# Patient Record
Sex: Male | Born: 1970 | Race: White | Hispanic: No | Marital: Married | Smoking: Former smoker
Health system: Southern US, Community
[De-identification: ages and names within clinical notes are randomized; demographics above are authoritative.]

## PROBLEM LIST (undated history)

## (undated) DIAGNOSIS — N400 Enlarged prostate without lower urinary tract symptoms: Secondary | ICD-10-CM

## (undated) DIAGNOSIS — E291 Testicular hypofunction: Secondary | ICD-10-CM

## (undated) DIAGNOSIS — R7309 Other abnormal glucose: Secondary | ICD-10-CM

## (undated) DIAGNOSIS — N201 Calculus of ureter: Secondary | ICD-10-CM

## (undated) DIAGNOSIS — R7303 Prediabetes: Secondary | ICD-10-CM

## (undated) DIAGNOSIS — Z9989 Dependence on other enabling machines and devices: Secondary | ICD-10-CM

## (undated) DIAGNOSIS — G4730 Sleep apnea, unspecified: Secondary | ICD-10-CM

## (undated) DIAGNOSIS — E785 Hyperlipidemia, unspecified: Secondary | ICD-10-CM

## (undated) DIAGNOSIS — R030 Elevated blood-pressure reading, without diagnosis of hypertension: Secondary | ICD-10-CM

## (undated) DIAGNOSIS — S5782XA Crushing injury of left forearm, initial encounter: Secondary | ICD-10-CM

## (undated) DIAGNOSIS — G4733 Obstructive sleep apnea (adult) (pediatric): Secondary | ICD-10-CM

## (undated) DIAGNOSIS — F324 Major depressive disorder, single episode, in partial remission: Secondary | ICD-10-CM

## (undated) DIAGNOSIS — Q433 Congenital malformations of intestinal fixation: Secondary | ICD-10-CM

## (undated) DIAGNOSIS — E559 Vitamin D deficiency, unspecified: Secondary | ICD-10-CM

## (undated) DIAGNOSIS — I10 Essential (primary) hypertension: Secondary | ICD-10-CM

## (undated) DIAGNOSIS — Z973 Presence of spectacles and contact lenses: Secondary | ICD-10-CM

## (undated) DIAGNOSIS — S5702XA Crushing injury of left elbow, initial encounter: Secondary | ICD-10-CM

## (undated) DIAGNOSIS — R42 Dizziness and giddiness: Secondary | ICD-10-CM

## (undated) DIAGNOSIS — E669 Obesity, unspecified: Secondary | ICD-10-CM

## (undated) HISTORY — DX: Obesity, unspecified: E66.9

## (undated) HISTORY — DX: Testicular hypofunction: E29.1

## (undated) HISTORY — DX: Prediabetes: R73.03

## (undated) HISTORY — DX: Crushing injury of left forearm, initial encounter: S57.82XA

## (undated) HISTORY — DX: Crushing injury of left elbow, initial encounter: S57.02XA

## (undated) HISTORY — DX: Major depressive disorder, single episode, in partial remission: F32.4

## (undated) HISTORY — DX: Sleep apnea, unspecified: G47.30

## (undated) HISTORY — DX: Dizziness and giddiness: R42

## (undated) HISTORY — PX: COLONOSCOPY: SHX174

## (undated) HISTORY — DX: Obstructive sleep apnea (adult) (pediatric): G47.33

## (undated) HISTORY — DX: Morbid (severe) obesity due to excess calories: E66.01

## (undated) HISTORY — DX: Other abnormal glucose: R73.09

## (undated) HISTORY — DX: Essential (primary) hypertension: I10

## (undated) HISTORY — DX: Dependence on other enabling machines and devices: Z99.89

## (undated) HISTORY — DX: Hyperlipidemia, unspecified: E78.5

## (undated) HISTORY — PX: ESOPHAGOGASTRODUODENOSCOPY: SHX1529

## (undated) HISTORY — DX: Vitamin D deficiency, unspecified: E55.9

---

## 1996-12-29 HISTORY — PX: ABDOMINAL EXPLORATION SURGERY: SHX538

## 2002-09-06 ENCOUNTER — Emergency Department (HOSPITAL_COMMUNITY): Admission: EM | Admit: 2002-09-06 | Discharge: 2002-09-06 | Payer: Self-pay | Admitting: *Deleted

## 2005-03-08 ENCOUNTER — Emergency Department (HOSPITAL_COMMUNITY): Admission: EM | Admit: 2005-03-08 | Discharge: 2005-03-08 | Payer: Self-pay | Admitting: Emergency Medicine

## 2008-03-17 ENCOUNTER — Ambulatory Visit (HOSPITAL_COMMUNITY): Admission: RE | Admit: 2008-03-17 | Discharge: 2008-03-17 | Payer: Self-pay | Admitting: Internal Medicine

## 2010-06-03 ENCOUNTER — Ambulatory Visit (HOSPITAL_COMMUNITY): Admission: RE | Admit: 2010-06-03 | Discharge: 2010-06-03 | Payer: Self-pay | Admitting: Internal Medicine

## 2010-12-16 ENCOUNTER — Ambulatory Visit (HOSPITAL_COMMUNITY)
Admission: RE | Admit: 2010-12-16 | Discharge: 2010-12-16 | Payer: Self-pay | Source: Home / Self Care | Attending: Internal Medicine | Admitting: Internal Medicine

## 2011-02-15 ENCOUNTER — Emergency Department (HOSPITAL_COMMUNITY)
Admission: EM | Admit: 2011-02-15 | Discharge: 2011-02-15 | Disposition: A | Discharge status: Home / Self Care | Payer: PRIVATE HEALTH INSURANCE | Attending: Emergency Medicine | Admitting: Emergency Medicine

## 2011-02-15 DIAGNOSIS — F3289 Other specified depressive episodes: Secondary | ICD-10-CM | POA: Insufficient documentation

## 2011-02-15 DIAGNOSIS — F329 Major depressive disorder, single episode, unspecified: Secondary | ICD-10-CM | POA: Insufficient documentation

## 2011-02-15 DIAGNOSIS — J039 Acute tonsillitis, unspecified: Secondary | ICD-10-CM | POA: Insufficient documentation

## 2011-02-15 DIAGNOSIS — E7800 Pure hypercholesterolemia, unspecified: Secondary | ICD-10-CM | POA: Insufficient documentation

## 2011-02-15 DIAGNOSIS — Z79899 Other long term (current) drug therapy: Secondary | ICD-10-CM | POA: Insufficient documentation

## 2011-05-04 ENCOUNTER — Ambulatory Visit: Payer: 59 | Attending: Otolaryngology

## 2011-05-04 DIAGNOSIS — Z6835 Body mass index (BMI) 35.0-35.9, adult: Secondary | ICD-10-CM | POA: Insufficient documentation

## 2011-05-04 DIAGNOSIS — G4733 Obstructive sleep apnea (adult) (pediatric): Secondary | ICD-10-CM | POA: Insufficient documentation

## 2011-05-10 NOTE — Procedures (Signed)
NAME:  Shane Saunders, MINICHIELLO NO.:  [id number]  MEDICAL RECORD NO.:  [id number]          PATIENT TYPE:  OUT  LOCATION:  SLEEP LAB                     FACILITY:  [facility]  PHYSICIAN:  Cherokee Boccio A. Gerilyn Pilgrim, M.D. DATE OF BIRTH:  Oct 17, 1971  DATE OF STUDY:  05/04/2011                           NOCTURNAL POLYSOMNOGRAM  REFERRING PHYSICIAN:  Cristal Deer E. Ezzard Standing, M.D.  REFERRING PHYSICIAN:  Kristine Garbe. Ezzard Standing, MD.  RECORDING DATE:  May 04, 2011.  INDICATION:  This is a 40 year old who presents with hypersomnia, fatigue, and snoring.  The study is being done to evaluate for obstructive sleep apnea syndrome.  INDICATION FOR STUDY:  EPWORTH SLEEPINESS SCORE:  MEDICATIONS:  Prevacid, Zetia, Claritin.  EPWORTH SLEEPINESS SCALE:  11.  BMI:  35.  ARCHITECTURAL SUMMARY:  This is a split night recording with the initial portion being a diagnostic and second portion a titration recording. The total recording time is 404 minutes.  Sleep efficiency is 79%. Sleep latency is 31 minutes.  REM latency is 246 minutes.  RESPIRATORY SUMMARY:  The baseline oxygen saturation is 95, lowest saturation 83 during non-REM sleep.  Diagnostic AHI is 65.1.  The patient was subsequently titrated between pressures of 5 and 15. Optimal pressure is 14 with resolution of obstructive events.  LIMB MOVEMENT SUMMARY:  PLM index 0.  ELECTROCARDIOGRAM SUMMARY:  Average heart rate 69 with no significant dysrhythmias observed.  IMPRESSION:  Severe obstructive sleep apnea syndrome, which responds well to a CPAP of 14.  Thanks for this referral.  SLEEP ARCHITECTURE:  RESPIRATORY DATA:  OXYGEN DATA:  CARDIAC DATA:  MOVEMENT-PARASOMNIA:  IMPRESSIONS-RECOMMENDATIONS:     [name] A. Gerilyn Pilgrim, M.D. Electronically Signed 05/12/2011 11:43:54    KAD/MEDQ  D:  05/09/2011 19:02:22  T:  05/10/2011 06:02:26  Job:  [id number]

## 2011-07-25 ENCOUNTER — Inpatient Hospital Stay (INDEPENDENT_AMBULATORY_CARE_PROVIDER_SITE_OTHER)
Admission: RE | Admit: 2011-07-25 | Discharge: 2011-07-25 | Disposition: A | Discharge status: Home / Self Care | Payer: 59 | Source: Ambulatory Visit | Attending: Family Medicine | Admitting: Family Medicine

## 2011-07-25 DIAGNOSIS — S0550XA Penetrating wound with foreign body of unspecified eyeball, initial encounter: Secondary | ICD-10-CM

## 2011-10-20 ENCOUNTER — Other Ambulatory Visit (HOSPITAL_COMMUNITY): Payer: Self-pay | Admitting: Internal Medicine

## 2011-10-20 ENCOUNTER — Ambulatory Visit (HOSPITAL_COMMUNITY)
Admission: RE | Admit: 2011-10-20 | Discharge: 2011-10-20 | Disposition: A | Discharge status: Home / Self Care | Payer: 59 | Source: Ambulatory Visit | Attending: Internal Medicine | Admitting: Internal Medicine

## 2011-10-20 DIAGNOSIS — R059 Cough, unspecified: Secondary | ICD-10-CM

## 2011-10-20 DIAGNOSIS — J984 Other disorders of lung: Secondary | ICD-10-CM | POA: Insufficient documentation

## 2011-10-20 DIAGNOSIS — R062 Wheezing: Secondary | ICD-10-CM | POA: Insufficient documentation

## 2011-10-20 DIAGNOSIS — R05 Cough: Secondary | ICD-10-CM | POA: Insufficient documentation

## 2011-10-20 DIAGNOSIS — Z87891 Personal history of nicotine dependence: Secondary | ICD-10-CM | POA: Insufficient documentation

## 2013-10-26 ENCOUNTER — Encounter: Payer: Self-pay | Admitting: Internal Medicine

## 2013-10-27 ENCOUNTER — Encounter: Payer: Self-pay | Admitting: Internal Medicine

## 2013-10-27 DIAGNOSIS — E559 Vitamin D deficiency, unspecified: Secondary | ICD-10-CM | POA: Insufficient documentation

## 2013-10-27 DIAGNOSIS — E782 Mixed hyperlipidemia: Secondary | ICD-10-CM | POA: Insufficient documentation

## 2013-10-27 DIAGNOSIS — G4733 Obstructive sleep apnea (adult) (pediatric): Secondary | ICD-10-CM | POA: Insufficient documentation

## 2013-10-27 DIAGNOSIS — R7309 Other abnormal glucose: Secondary | ICD-10-CM | POA: Insufficient documentation

## 2013-10-27 DIAGNOSIS — I10 Essential (primary) hypertension: Secondary | ICD-10-CM | POA: Insufficient documentation

## 2013-11-07 ENCOUNTER — Encounter: Payer: Self-pay | Admitting: Physician Assistant

## 2013-11-07 ENCOUNTER — Ambulatory Visit: Payer: 59 | Admitting: Physician Assistant

## 2013-11-07 VITALS — BP 120/80 | HR 80 | Temp 99.0°F | Resp 16 | Wt 245.0 lb

## 2013-11-07 DIAGNOSIS — E785 Hyperlipidemia, unspecified: Secondary | ICD-10-CM

## 2013-11-07 DIAGNOSIS — R197 Diarrhea, unspecified: Secondary | ICD-10-CM

## 2013-11-07 DIAGNOSIS — I10 Essential (primary) hypertension: Secondary | ICD-10-CM

## 2013-11-07 DIAGNOSIS — E669 Obesity, unspecified: Secondary | ICD-10-CM

## 2013-11-07 DIAGNOSIS — E559 Vitamin D deficiency, unspecified: Secondary | ICD-10-CM

## 2013-11-07 DIAGNOSIS — R7303 Prediabetes: Secondary | ICD-10-CM

## 2013-11-07 MED ORDER — METRONIDAZOLE 500 MG PO TABS: 500.0000 mg | ORAL_TABLET | Freq: Three times a day (TID) | ORAL | Status: AC

## 2013-11-07 MED ORDER — CIPROFLOXACIN HCL 500 MG PO TABS: 500.0000 mg | ORAL_TABLET | Freq: Two times a day (BID) | ORAL | Status: AC

## 2013-11-07 MED ORDER — HYOSCYAMINE SULFATE 0.125 MG SL SUBL: 0.1250 mg | SUBLINGUAL_TABLET | SUBLINGUAL | Status: DC | PRN

## 2013-11-07 NOTE — Patient Instructions (Signed)
Diverticulitis °A diverticulum is a small pouch or sac on the colon. Diverticulosis is the presence of these diverticula on the colon. Diverticulitis is the irritation (inflammation) or infection of diverticula. °CAUSES  °The colon and its diverticula contain bacteria. If food particles block the tiny opening to a diverticulum, the bacteria inside can grow and cause an increase in pressure. This leads to infection and inflammation and is called diverticulitis. °SYMPTOMS  °· Abdominal pain and tenderness. Usually, the pain is located on the left side of your abdomen. However, it could be located elsewhere. °· Fever. °· Bloating. °· Feeling sick to your stomach (nausea). °· Throwing up (vomiting). °· Abnormal stools. °DIAGNOSIS  °Your caregiver will take a history and perform a physical exam. Since many things can cause abdominal pain, other tests may be necessary. Tests may include: °· Blood tests. °· Urine tests. °· X-ray of the abdomen. °· CT scan of the abdomen. °Sometimes, surgery is needed to determine if diverticulitis or other conditions are causing your symptoms. °TREATMENT  °Most of the time, you can be treated without surgery. Treatment includes: °· Resting the bowels by only having liquids for a few days. As you improve, you will need to eat a low-fiber diet. °· Intravenous (IV) fluids if you are losing body fluids (dehydrated). °· Antibiotic medicines that treat infections may be given. °· Pain and nausea medicine, if needed. °· Surgery if the inflamed diverticulum has burst. °HOME CARE INSTRUCTIONS  °· Try a clear liquid diet (broth, tea, or water for as long as directed by your caregiver). You may then gradually begin a low-fiber diet as tolerated.  °A low-fiber diet is a diet with less than 10 grams of fiber. Choose the foods below to reduce fiber in the diet: °· White breads, cereals, rice, and pasta. °· Cooked fruits and vegetables or soft fresh fruits and vegetables without the skin. °· Ground or  well-cooked tender beef, ham, veal, lamb, pork, or poultry. °· Eggs and seafood. °· After your diverticulitis symptoms have improved, your caregiver may put you on a high-fiber diet. A high-fiber diet includes 14 grams of fiber for every 1000 calories consumed. For a standard 2000 calorie diet, you would need 28 grams of fiber. Follow these diet guidelines to help you increase the fiber in your diet. It is important to slowly increase the amount fiber in your diet to avoid gas, constipation, and bloating. °· Choose whole-grain breads, cereals, pasta, and brown rice. °· Choose fresh fruits and vegetables with the skin on. Do not overcook vegetables because the more vegetables are cooked, the more fiber is lost. °· Choose more nuts, seeds, legumes, dried peas, beans, and lentils. °· Look for food products that have greater than 3 grams of fiber per serving on the Nutrition Facts label. °· Take all medicine as directed by your caregiver. °· If your caregiver has given you a follow-up appointment, it is very important that you go. Not going could result in lasting (chronic) or permanent injury, pain, and disability. If there is any problem keeping the appointment, call to reschedule. °SEEK MEDICAL CARE IF:  °· Your pain does not improve. °· You have a hard time advancing your diet beyond clear liquids. °· Your bowel movements do not return to normal. °SEEK IMMEDIATE MEDICAL CARE IF:  °· Your pain becomes worse. °· You have an oral temperature above 102° F (38.9° C), not controlled by medicine. °· You have repeated vomiting. °· You have bloody or black, tarry stools. °·   Symptoms that brought you to your caregiver become worse or are not getting better. °MAKE SURE YOU:  °· Understand these instructions. °· Will watch your condition. °· Will get help right away if you are not doing well or get worse. °Document Released: 09/24/2005 Document Revised: 03/08/2012 Document Reviewed: 01/20/2011 °[employer]® Patient Information  ©2014 [employer]. ° °

## 2013-11-07 NOTE — Progress Notes (Signed)
HPI Patient presents for 3 month follow up with hypertension, hyperlipidemia, prediabetes and vitamin D.  Patient's blood pressure has been controlled at home. Patient denies chest pain, shortness of breath, dizziness.  Patient's cholesterol is diet controlled and is on atrovastatin  and denies myalgias. The cholesterol last visit was  . he has  been working on diet and exercise for prediabetes, denies changes in vision, polys, and paresthesias.   Patient is on Vitamin D supplement.  Current Medications:    Medication List       This list is accurate as of: 11/07/13  3:33 PM.  Always use your most recent med list.               ALPRAZolam 2 MG 24 hr tablet  Commonly known as:  XANAX XR  2 mg. 1 pill daily PRN     cholecalciferol 1000 UNITS tablet  Commonly known as:  VITAMIN D  Take 8,000 Units by mouth daily.     citalopram 40 MG tablet  Commonly known as:  CELEXA  40 mg. 1.5 pills daily     LIPITOR 80 MG tablet  Generic drug:  atorvastatin  Take 80 mg by mouth daily. Takes 1/2 daily     pravastatin 40 MG tablet  Commonly known as:  PRAVACHOL  Take 40 mg by mouth daily.       Allergies:  Allergies  Allergen Reactions  . Effexor [Venlafaxine]   . Pravastatin   . Prednisone     Decrease sleep  . Zinc     ROS Constitutional: Denies fever, chills, weight loss/gain, headaches, insomnia, fatigue, night sweats, and change in appetite. Eyes: Denies redness, blurred vision, diplopia, discharge, itchy, watery eyes.  ENT: Denies discharge, congestion, post nasal drip, sore throat, earache, dental pain, Tinnitus, Vertigo, Sinus pain, snoring.  Cardio: Denies chest pain, palpitations, irregular heartbeat,  dyspnea, diaphoresis, orthopnea, PND, claudication, edema Respiratory: denies cough, dyspnea,pleurisy, hoarseness, wheezing.  Gastrointestinal: +diarrhea and nausea with vomiting once. Daughter also has gastroenteritis.  Denies dysphagia, heartburn,  water brash, pain,  cramps, bloating, constipation, hematemesis, melena, hematochezia,  hemorrhoids Genitourinary: Denies dysuria, frequency, urgency, nocturia, hesitancy, discharge, hematuria, flank pain Musculoskeletal: Denies arthralgia, myalgia, stiffness, Jt. Swelling, pain, limp, and strain/sprain. Skin: Denies puritis, rash, hives, warts, acne, eczema, changing in skin lesion Neuro: Weakness, tremor, incoordination, spasms, paresthesia, pain Psychiatric: Denies confusion, memory loss, sensory loss Endocrine: Denies change in weight, skin, hair change, nocturia, and paresthesia, Diabetic Polys, visual blurring, hyper /hypo glycemic episodes.  Heme/Lymph: Excessive bleeding, bruising, enlarged lymph nodes Medical History:  Past Medical History  Diagnosis Date  . Hypertension   . Hyperlipidemia   . OSA on CPAP   . Prediabetes   . Obesity (BMI 30-39.9)   . Hypogonadism male   . Vitamin D deficiency   . Anemia    Family history- Review and unchanged Social history- Review and unchanged Physical Exam: Filed Vitals:   11/07/13 1528  BP: 120/80  Pulse: 80  Temp: 99 F (37.2 C)  Resp: 16   Filed Weights   11/07/13 1528  Weight: 245 lb (111.131 kg)   General Appearance: Well nourished, in no apparent distress. Eyes: PERRLA, EOMs, conjunctiva no swelling or erythema, normal fundi and vessels. Sinuses: No Frontal/maxillary tenderness ENT/Mouth: Ext aud canals clear, with TMs without erythema, bulging.No erythema, swelling, or exudate on post pharynx.  Tonsils not swollen or erythematous. Hearing normal.  Neck: Supple, thyroid normal.  Respiratory: Respiratory effort normal, BS equal bilaterally without rales,  rhonci, wheezing or stridor.  Cardio: Heart sounds normal, regular rate and rhythm without murmurs, rubs or gallops. Peripheral pulses brisk and equal bilaterally, without edema.  Abdomen: Flat, soft, with bowel sounds. Nontender, no guarding, rebound, hernias, masses, or organomegaly.   Lymphatics: Non tender without lymphadenopathy.  Musculoskeletal: Full ROM all peripheral extremities, joint stability, 5/5 strength, and normal gait. Skin: Warm, dry without rashes, lesions, ecchymosis.  Neuro: Cranial nerves intact, reflexes equal bilaterally. Normal muscle tone, no cerebellar symptoms. Sensation intact.  Pysch: Awake and oriented X 3, normal affect, Insight and Judgment appropriate.   Assessement and Plan:  Hypertension: Continue medication, monitor blood pressure at home. Continue DASH diet. Cholesterol: Continue diet and exercise. Check cholesterol.  Pre-diabetes-Continue diet and exercise. Check A1C Vitamin D Def- get back on medications.  Obesity- discussed diet and exercise.  Gastroenteritis vs Diverticulitis- + LLQ tenderness and temperative, levsin called in with Cipro/Flagyl and hand out given. If worsening pain go to ER.   Quentin Mulling 3:33 PM

## 2013-11-08 LAB — CBC WITH DIFFERENTIAL/PLATELET
Basophils Absolute: 0 10*3/uL (ref 0.0–0.1)
Basophils Relative: 0 % (ref 0–1)
Eosinophils Absolute: 0.4 10*3/uL (ref 0.0–0.7)
Eosinophils Relative: 8 % — ABNORMAL HIGH (ref 0–5)
HCT: 45.1 % (ref 39.0–52.0)
Hemoglobin: 16.2 g/dL (ref 13.0–17.0)
Lymphocytes Relative: 26 % (ref 12–46)
Lymphs Abs: 1.3 10*3/uL (ref 0.7–4.0)
MCH: 31.6 pg (ref 26.0–34.0)
MCHC: 35.9 g/dL (ref 30.0–36.0)
MCV: 87.9 fL (ref 78.0–100.0)
Monocytes Absolute: 0.8 10*3/uL (ref 0.1–1.0)
Monocytes Relative: 16 % — ABNORMAL HIGH (ref 3–12)
Neutro Abs: 2.5 10*3/uL (ref 1.7–7.7)
Neutrophils Relative %: 50 % (ref 43–77)
RBC: 5.13 MIL/uL (ref 4.22–5.81)
RDW: 13.2 % (ref 11.5–15.5)
WBC: 5.4 10*3/uL (ref 4.0–10.5)

## 2013-11-08 LAB — BASIC METABOLIC PANEL WITH GFR
BUN: 10 mg/dL (ref 6–23)
CO2: 27 mEq/L (ref 19–32)
Calcium: 9.4 mg/dL (ref 8.4–10.5)
Chloride: 101 mEq/L (ref 96–112)
Creat: 1.09 mg/dL (ref 0.50–1.35)
GFR, Est African American: >89 mL/min
GFR, Est Non African American: 83 mL/min
Glucose, Bld: 96 mg/dL (ref 70–99)
Potassium: 3.7 mEq/L (ref 3.5–5.3)
Sodium: 137 mEq/L (ref 135–145)

## 2013-11-08 LAB — LIPID PANEL
Cholesterol: 140 mg/dL (ref 0–200)
HDL: 34 mg/dL — ABNORMAL LOW (ref >39)
LDL Cholesterol: 81 mg/dL (ref 0–99)
Total CHOL/HDL Ratio: 4.1 Ratio
Triglycerides: 126 mg/dL (ref <150)
VLDL: 25 mg/dL (ref 0–40)

## 2013-11-08 LAB — HEMOGLOBIN A1C
Hgb A1c MFr Bld: 5.5 % (ref <5.7)
Mean Plasma Glucose: 111 mg/dL (ref <117)

## 2013-11-08 LAB — HEPATIC FUNCTION PANEL
ALT: 38 U/L (ref 0–53)
AST: 26 U/L (ref 0–37)
Albumin: 4.3 g/dL (ref 3.5–5.2)
Alkaline Phosphatase: 64 U/L (ref 39–117)
Bilirubin, Direct: 0.2 mg/dL (ref 0.0–0.3)
Indirect Bilirubin: 0.7 mg/dL (ref 0.0–0.9)
Total Bilirubin: 0.9 mg/dL (ref 0.3–1.2)
Total Protein: 6.5 g/dL (ref 6.0–8.3)

## 2013-11-08 LAB — TSH: TSH: 3.192 u[IU]/mL (ref 0.350–4.500)

## 2013-11-08 LAB — INSULIN, FASTING: Insulin fasting, serum: 50 u[IU]/mL — ABNORMAL HIGH (ref 3–28)

## 2013-11-30 ENCOUNTER — Ambulatory Visit (INDEPENDENT_AMBULATORY_CARE_PROVIDER_SITE_OTHER): Payer: 59 | Admitting: Physician Assistant

## 2013-11-30 ENCOUNTER — Encounter: Payer: Self-pay | Admitting: Physician Assistant

## 2013-11-30 VITALS — BP 110/78 | HR 76 | Temp 98.6°F | Resp 16 | Ht 70.5 in | Wt 253.0 lb

## 2013-11-30 DIAGNOSIS — E559 Vitamin D deficiency, unspecified: Secondary | ICD-10-CM

## 2013-11-30 DIAGNOSIS — R7303 Prediabetes: Secondary | ICD-10-CM

## 2013-11-30 DIAGNOSIS — E785 Hyperlipidemia, unspecified: Secondary | ICD-10-CM

## 2013-11-30 DIAGNOSIS — I10 Essential (primary) hypertension: Secondary | ICD-10-CM

## 2013-11-30 DIAGNOSIS — J209 Acute bronchitis, unspecified: Secondary | ICD-10-CM

## 2013-11-30 LAB — BASIC METABOLIC PANEL WITH GFR
BUN: 11 mg/dL (ref 6–23)
CO2: 30 mEq/L (ref 19–32)
Calcium: 9.5 mg/dL (ref 8.4–10.5)
Chloride: 99 mEq/L (ref 96–112)
Creat: 1.16 mg/dL (ref 0.50–1.35)
GFR, Est African American: 89 mL/min
GFR, Est Non African American: 77 mL/min
Glucose, Bld: 124 mg/dL — ABNORMAL HIGH (ref 70–99)
Potassium: 4.1 mEq/L (ref 3.5–5.3)
Sodium: 141 mEq/L (ref 135–145)

## 2013-11-30 LAB — HEPATIC FUNCTION PANEL
ALT: 33 U/L (ref 0–53)
AST: 20 U/L (ref 0–37)
Albumin: 4.6 g/dL (ref 3.5–5.2)
Alkaline Phosphatase: 70 U/L (ref 39–117)
Bilirubin, Direct: 0.1 mg/dL (ref 0.0–0.3)
Indirect Bilirubin: 0.6 mg/dL (ref 0.0–0.9)
Total Bilirubin: 0.7 mg/dL (ref 0.3–1.2)
Total Protein: 7 g/dL (ref 6.0–8.3)

## 2013-11-30 LAB — LIPID PANEL
Cholesterol: 151 mg/dL (ref 0–200)
HDL: 35 mg/dL — ABNORMAL LOW (ref >39)
LDL Cholesterol: 53 mg/dL (ref 0–99)
Total CHOL/HDL Ratio: 4.3 Ratio
Triglycerides: 314 mg/dL — ABNORMAL HIGH (ref <150)
VLDL: 63 mg/dL — ABNORMAL HIGH (ref 0–40)

## 2013-11-30 MED ORDER — DEXAMETHASONE SODIUM PHOSPHATE 100 MG/10ML IJ SOLN
10.0000 mg | Freq: Once | INTRAMUSCULAR | Status: AC
  Administered 2013-11-30: 10 mg via INTRAMUSCULAR

## 2013-11-30 MED ORDER — LEVOFLOXACIN 500 MG PO TABS: 500.0000 mg | ORAL_TABLET | Freq: Every day | ORAL | Status: AC

## 2013-11-30 NOTE — Progress Notes (Signed)
HPI Patient presents for 3 month follow up with hypertension, hyperlipidemia, prediabetes and vitamin D. Patient's blood pressure has been controlled at home. Patient denies chest pain, shortness of breath, dizziness.  Patient's cholesterol is diet controlled. In addition last visit he was switched from pravastatin to atrovastatin and denies myalgias. The cholesterol last visit was LDL 117. The patient has been working on diet and exercise for prediabetes, and denies changes in vision, polys, and paresthesias.  A1C 5.4 but insulin is still elevated at 41.  Patient is on Vitamin D supplement.  For the past 3-5 days, having post nasal drip, sinus congestion, productive clear cough, denies fever chills SOB, teeth pain. He has been on theraflu, zyrtec without relief.  Current Medications:  Current Outpatient Prescriptions on File Prior to Visit  Medication Sig Dispense Refill  . ALPRAZolam (XANAX XR) 2 MG 24 hr tablet 2 mg. 1 pill daily PRN      . atorvastatin (LIPITOR) 80 MG tablet Take 80 mg by mouth daily. Takes 1/2 daily      . cholecalciferol (VITAMIN D) 1000 UNITS tablet Take 8,000 Units by mouth daily.      . citalopram (CELEXA) 40 MG tablet 40 mg. 1.5 pills daily      . hyoscyamine (LEVSIN/SL) 0.125 MG SL tablet Place 1 tablet (0.125 mg total) under the tongue every 4 (four) hours as needed.  30 tablet  0   No current facility-administered medications on file prior to visit.   Medical History:  Past Medical History  Diagnosis Date  . Hypertension   . Hyperlipidemia   . OSA on CPAP   . Prediabetes   . Obesity (BMI 30-39.9)   . Hypogonadism male   . Vitamin D deficiency   . Anemia    Allergies:  Allergies  Allergen Reactions  . Effexor [Venlafaxine]   . Pravastatin   . Prednisone     Decrease sleep  . Zinc     ROS Constitutional: Denies fever, chills, weight loss/gain, headaches, insomnia, fatigue, night sweats, and change in appetite. Eyes: Denies redness, blurred vision,  diplopia, discharge, itchy, watery eyes.  ENT: +congestion, post nasal drip, sore throat, earache, Denies discharge,  dental pain, Tinnitus, Vertigo, snoring.  Cardio: Denies chest pain, palpitations, irregular heartbeat,  dyspnea, diaphoresis, orthopnea, PND, claudication, edema Respiratory: + cough , wheezing.  denies  dyspnea,pleurisy, hoarseness Gastrointestinal: Denies dysphagia, heartburn,  water brash, pain, cramps, nausea, vomiting, bloating, diarrhea, constipation, hematemesis, melena, hematochezia,  hemorrhoids Genitourinary: Denies dysuria, frequency, urgency, nocturia, hesitancy, discharge, hematuria, flank pain Musculoskeletal: Denies arthralgia, myalgia, stiffness, Jt. Swelling, pain, limp, and strain/sprain. Skin: Denies pruritis, rash, hives, warts, acne, eczema, changing in skin lesion Neuro: Denies Weakness, tremor, incoordination, spasms, paresthesia, pain Psychiatric: Denies confusion, memory loss, sensory loss Endocrine: Denies change in weight, skin, hair change, nocturia, and paresthesia, Diabetic Polys, Denies visual blurring, hyper /hypo glycemic episodes.  Heme/Lymph: Denies Excessive bleeding, bruising, enlarged lymph nodes  Family history- Review and unchanged Social history- Review and unchanged Physical Exam: Filed Vitals:   11/30/13 1410  BP: 110/78  Pulse: 76  Temp: 98.6 F (37 C)  Resp: 16   Filed Weights   11/30/13 1410  Weight: 253 lb (114.76 kg)   General Appearance: Well nourished, in no apparent distress. Eyes: PERRLA, EOMs, conjunctiva no swelling or erythema, normal fundi and vessels. Sinuses: + Frontal/maxillary tenderness ENT/Mouth: Ext aud canals clear, with TMs without erythema, bulging.+ erythema, swelling, but without exudate on post pharynx.  Tonsils not swollen or erythematous.  Hearing normal.  Neck: Supple, thyroid normal.  Respiratory: Respiratory effort normal, BS equal bilaterally + wheezing without rales, rhonchi, or stridor.   Cardio: Heart sounds normal, regular rate and rhythm without murmurs, rubs or gallops. Peripheral pulses brisk and equal bilaterally, without edema.  Abdomen: obese, soft, with bowel sounds. Non tender, no guarding, rebound, hernias, masses, or organomegaly.  Lymphatics: Non tender without lymphadenopathy.  Musculoskeletal: Full ROM all peripheral extremities, joint stability, 5/5 strength, and normal gait. Skin: Warm, dry without rashes, lesions, ecchymosis.  Neuro: Cranial nerves intact, reflexes equal bilaterally. Normal muscle tone, no cerebellar symptoms. Sensation intact.  Psych: Awake and oriented X 3, normal affect, Insight and Judgment appropriate.   Assessment and Plan:  Hypertension: Continue medication, monitor blood pressure at home. Continue DASH diet. Cholesterol: Continue diet and exercise. Check cholesterol.  Pre-diabetes-Continue diet and exercise. Check A1C Vitamin D Def- check level and continue medications.  Bronchitis/sinusitis- Levaquin #10 and Dexamethasone shot- call if you are not feeling better   Shane Saunders 2:19 PM

## 2013-12-01 ENCOUNTER — Ambulatory Visit: Payer: Self-pay | Admitting: Physician Assistant

## 2013-12-01 LAB — INSULIN, FASTING: Insulin fasting, serum: 181 u[IU]/mL — ABNORMAL HIGH (ref 3–28)

## 2013-12-01 LAB — TSH: TSH: 1.595 u[IU]/mL (ref 0.350–4.500)

## 2013-12-01 LAB — CBC WITH DIFFERENTIAL/PLATELET
Basophils Absolute: 0.1 10*3/uL (ref 0.0–0.1)
Basophils Relative: 1 % (ref 0–1)
Eosinophils Absolute: 0.5 10*3/uL (ref 0.0–0.7)
Eosinophils Relative: 8 % — ABNORMAL HIGH (ref 0–5)
HCT: 43.8 % (ref 39.0–52.0)
Hemoglobin: 15.6 g/dL (ref 13.0–17.0)
Lymphocytes Relative: 26 % (ref 12–46)
Lymphs Abs: 1.8 10*3/uL (ref 0.7–4.0)
MCH: 32.1 pg (ref 26.0–34.0)
MCHC: 35.6 g/dL (ref 30.0–36.0)
MCV: 90.1 fL (ref 78.0–100.0)
Monocytes Absolute: 0.8 10*3/uL (ref 0.1–1.0)
Monocytes Relative: 11 % (ref 3–12)
Neutro Abs: 3.9 10*3/uL (ref 1.7–7.7)
Neutrophils Relative %: 54 % (ref 43–77)
RBC: 4.86 MIL/uL (ref 4.22–5.81)
RDW: 13.9 % (ref 11.5–15.5)
WBC: 7 10*3/uL (ref 4.0–10.5)

## 2013-12-01 LAB — HEMOGLOBIN A1C
Hgb A1c MFr Bld: 5.6 % (ref <5.7)
Mean Plasma Glucose: 114 mg/dL (ref <117)

## 2013-12-01 LAB — VITAMIN D 25 HYDROXY (VIT D DEFICIENCY, FRACTURES): Vit D, 25-Hydroxy: 42 ng/mL (ref 30–89)

## 2014-02-02 ENCOUNTER — Other Ambulatory Visit: Payer: Self-pay | Admitting: Internal Medicine

## 2014-02-08 ENCOUNTER — Ambulatory Visit (INDEPENDENT_AMBULATORY_CARE_PROVIDER_SITE_OTHER): Payer: 59 | Admitting: Internal Medicine

## 2014-02-08 ENCOUNTER — Encounter: Payer: Self-pay | Admitting: Internal Medicine

## 2014-02-08 VITALS — BP 114/66 | HR 80 | Temp 98.2°F | Resp 16 | Wt 256.0 lb

## 2014-02-08 DIAGNOSIS — R7309 Other abnormal glucose: Secondary | ICD-10-CM

## 2014-02-08 DIAGNOSIS — E559 Vitamin D deficiency, unspecified: Secondary | ICD-10-CM

## 2014-02-08 DIAGNOSIS — R7303 Prediabetes: Secondary | ICD-10-CM

## 2014-02-08 DIAGNOSIS — Z79899 Other long term (current) drug therapy: Secondary | ICD-10-CM | POA: Insufficient documentation

## 2014-02-08 DIAGNOSIS — E785 Hyperlipidemia, unspecified: Secondary | ICD-10-CM

## 2014-02-08 DIAGNOSIS — E291 Testicular hypofunction: Secondary | ICD-10-CM

## 2014-02-08 DIAGNOSIS — I10 Essential (primary) hypertension: Secondary | ICD-10-CM

## 2014-02-08 DIAGNOSIS — E782 Mixed hyperlipidemia: Secondary | ICD-10-CM

## 2014-02-08 MED ORDER — MELOXICAM 15 MG PO TABS: ORAL_TABLET | ORAL | Status: DC

## 2014-02-08 NOTE — Progress Notes (Signed)
Patient ID: Shane Saunders, male   DOB: [birth year], 43 y.o.   MRN: [id number]   This very nice 43 y.o. MWM presents for 3 month follow up with Hypertension, Hyperlipidemia, Pre-Diabetes and Vitamin D Deficiency. He also has OSA and reports infrequently using his CPAP device due to poor fit.   HTN predates since 2010. BP has been controlled at home. Today's BP: 114/66 mmHg . Patient denies any cardiac type chest pain, palpitations, dyspnea/orthopnea/PND, dizziness, claudication, or dependent edema.   Hyperlipidemia is controlled with diet & meds.  Patient denies myalgias or other med SE's.  Lab Results  Component Value Date   CHOL 151 11/30/2013   HDL 35* 11/30/2013   LDLCALC 53 11/30/2013   TRIG 314* 11/30/2013   CHOLHDL 4.3 11/30/2013    Also, the patient has history of PreDiabetes/insulin resistance since 03/2011  with last A1c of  5.4% with elevated insulin 41 in Aug 2014. Patient denies any symptoms of reactive hypoglycemia, diabetic polys, paresthesias or visual blurring.    Further, Patient has history of Vitamin D Deficiency of 72 in 2008 with last vitamin D of 53 in Oct 2014. Patient supplements vitamin D without any suspected side-effects.    Medication List       This list is accurate as of: 02/08/14  7:16 PM.  Always use your most recent med list.               ALPRAZolam 2 MG 24 hr tablet  Commonly known as:  XANAX XR  2 mg. 1 pill daily PRN     citalopram 40 MG tablet  Commonly known as:  CELEXA  TAKE 1&1/2 TABLET BY MOUTH EVERY DAY FOR MOOD     LIPITOR 80 MG tablet  Generic drug:  atorvastatin  Take 80 mg by mouth daily. Takes 1/2 daily     meloxicam 15 MG tablet  Commonly known as:  MOBIC  1/2 to 1 tablet daily with food for Pain and inflammation     VITAMIN D PO  Take 2,000 Units by mouth. Takes 8000 units daily         Allergies  Allergen Reactions  . Effexor [Venlafaxine]   . Pravastatin   . Prednisone     Decrease sleep  . Zinc     PMHx:    Past Medical History  Diagnosis Date  . Hypertension   . Hyperlipidemia   . OSA on CPAP   . Prediabetes   . Obesity (BMI 30-39.9)   . Hypogonadism male   . Vitamin D deficiency   . Anemia     FHx:    Reviewed / unchanged  SHx:    Reviewed / unchanged  Systems Review: Constitutional: Denies fever, chills, wt changes, headaches, insomnia, fatigue, night sweats, change in appetite. Eyes: Denies redness, blurred vision, diplopia, discharge, itchy, watery eyes.  ENT: Denies discharge, congestion, post nasal drip, epistaxis, sore throat, earache, hearing loss, dental pain, tinnitus, vertigo, sinus pain, snoring.  CV: Denies chest pain, palpitations, irregular heartbeat, syncope, dyspnea, diaphoresis, orthopnea, PND, claudication, edema. Respiratory: denies cough, dyspnea, DOE, pleurisy, hoarseness, laryngitis, wheezing.  Gastrointestinal: Denies dysphagia, odynophagia, heartburn, reflux, water brash, abdominal pain or cramps, nausea, vomiting, bloating, diarrhea, constipation, hematemesis, melena, hematochezia,  or hemorrhoids. Genitourinary: Denies dysuria, frequency, urgency, nocturia, hesitancy, discharge, hematuria, flank pain. Musculoskeletal: Denies arthralgias, myalgias, stiffness, jt. swelling, pain, limp, strain/sprain.  Skin: Denies pruritus, rash, hives, warts, acne, eczema, change in skin lesion(s). Neuro: No weakness, tremor, incoordination, spasms, paresthesia, or  pain. Psychiatric: Denies confusion, memory loss, or sensory loss. Endo: Denies change in weight, skin, hair change.  Heme/Lymph: No excessive bleeding, bruising, orenlarged lymph nodes.  Filed Vitals:   02/08/14 1604  BP: 114/66  Pulse: 80  Temp: 98.2 F (36.8 C)  Resp: 16    Estimated body mass index is 36.2 kg/(m^2) as calculated from the following:   Height as of 11/30/13: 5' 10.5" (1.791 m).   Weight as of this encounter: 256 lb (116.121 kg).  On Exam: Appears well nourished - in no  distress. Eyes: PERRLA, EOMs, conjunctiva no swelling or erythema. Sinuses: No frontal/maxillary tenderness ENT/Mouth: EAC's clear, TM's nl w/o erythema, bulging. Nares clear w/o erythema, swelling, exudates. Oropharynx clear without erythema or exudates. Oral hygiene is good. Tongue normal, non obstructing. Hearing intact.  Neck: Supple. Thyroid nl. Car 2+/2+ without bruits, nodes or JVD. Chest: Respirations nl with BS clear & equal w/o rales, rhonchi, wheezing or stridor.  Cor: Heart sounds normal w/ regular rate and rhythm without sig. murmurs, gallops, clicks, or rubs. Peripheral pulses normal and equal  without edema.  Abdomen: Soft & bowel sounds normal. Non-tender w/o guarding, rebound, hernias, masses, or organomegaly.  Lymphatics: Unremarkable.  Musculoskeletal: Full ROM all peripheral extremities, joint stability, 5/5 strength, and normal gait. C/o Bilat knee pains Skin: Warm, dry without exposed rashes, lesions, ecchymosis apparent.  Neuro: Cranial nerves intact, reflexes equal bilaterally. Sensory-motor testing grossly intact. Tendon reflexes grossly intact.  Pysch: Alert & oriented x 3. Insight and judgement nl & appropriate. No ideations.  Assessment and Plan:  1. Hypertension - Continue monitor blood pressure at home. Continue diet/meds same.  2. Hyperlipidemia - Continue diet/meds, exercise,& lifestyle modifications. Continue monitor periodic cholesterol/liver & renal functions   3. Pre-diabetes/Insulin Resistance - Continue diet, exercise, lifestyle modifications. Monitor appropriate labs.  4. Vitamin D Deficiency - Continue supplementation.  5. Morbid Obesity (BMI 36)  6. OSA/ off CPAP  7. DJD, knees - try Rx Meloxicam 15 mg  Recommended regular exercise, BP monitoring, weight control, and discussed med and SE's. Recommended labs to assess and monitor clinical status. Further disposition pending results of labs.

## 2014-02-08 NOTE — Patient Instructions (Addendum)
Dr Irene Limbo  - Dental       [phone]   for [facility]   Hypertension As your heart beats, it forces blood through your arteries. This force is your blood pressure. If the pressure is too high, it is called hypertension (HTN) or high blood pressure. HTN is dangerous because you may have it and not know it. High blood pressure may mean that your heart has to work harder to pump blood. Your arteries may be narrow or stiff. The extra work puts you at risk for heart disease, stroke, and other problems.  Blood pressure consists of two numbers, a higher number over a lower, 110/72, for example. It is stated as "110 over 72." The ideal is below 120 for the top number (systolic) and under 80 for the bottom (diastolic). Write down your blood pressure today. You should pay close attention to your blood pressure if you have certain conditions such as:  Heart failure.  Prior heart attack.  Diabetes  Chronic kidney disease.  Prior stroke.  Multiple risk factors for heart disease. To see if you have HTN, your blood pressure should be measured while you are seated with your arm held at the level of the heart. It should be measured at least twice. A one-time elevated blood pressure reading (especially in the Emergency Department) does not mean that you need treatment. There may be conditions in which the blood pressure is different between your right and left arms. It is important to see your caregiver soon for a recheck. Most people have essential hypertension which means that there is not a specific cause. This type of high blood pressure may be lowered by changing lifestyle factors such as:  Stress.  Smoking.  Lack of exercise.  Excessive weight.  Drug/tobacco/alcohol use.  Eating less salt. Most people do not have symptoms from high blood pressure until it has caused damage to the body. Effective treatment can often prevent, delay or reduce that  damage. TREATMENT  When a cause has been identified, treatment for high blood pressure is directed at the cause. There are a large number of medications to treat HTN. These fall into several categories, and your caregiver will help you select the medicines that are best for you. Medications may have side effects. You should review side effects with your caregiver. If your blood pressure stays high after you have made lifestyle changes or started on medicines,   Your medication(s) may need to be changed.  Other problems may need to be addressed.  Be certain you understand your prescriptions, and know how and when to take your medicine.  Be sure to follow up with your caregiver within the time frame advised (usually within two weeks) to have your blood pressure rechecked and to review your medications.  If you are taking more than one medicine to lower your blood pressure, make sure you know how and at what times they should be taken. Taking two medicines at the same time can result in blood pressure that is too low. SEEK IMMEDIATE MEDICAL CARE IF:  You develop a severe headache, blurred or changing vision, or confusion.  You have unusual weakness or numbness, or a faint feeling.  You have severe chest or abdominal pain, vomiting, or breathing problems. MAKE SURE YOU:   Understand these instructions.  Will watch your condition.  Will get help right away if you are not doing well or get worse.   Diabetes and Exercise Exercising regularly is  important. It is not just about losing weight. It has many health benefits, such as:  Improving your overall fitness, flexibility, and endurance.  Increasing your bone density.  Helping with weight control.  Decreasing your body fat.  Increasing your muscle strength.  Reducing stress and tension.  Improving your overall health. People with diabetes who exercise gain additional benefits because exercise:  Reduces appetite.  Improves  the body's use of blood sugar (glucose).  Helps lower or control blood glucose.  Decreases blood pressure.  Helps control blood lipids (such as cholesterol and triglycerides).  Improves the body's use of the hormone insulin by:  Increasing the body's insulin sensitivity.  Reducing the body's insulin needs.  Decreases the risk for heart disease because exercising:  Lowers cholesterol and triglycerides levels.  Increases the levels of good cholesterol (such as high-density lipoproteins [HDL]) in the body.  Lowers blood glucose levels. YOUR ACTIVITY PLAN  Choose an activity that you enjoy and set realistic goals. Your health care provider or diabetes educator can help you make an activity plan that works for you. You can break activities into 2 or 3 sessions throughout the day. Doing so is as good as one long session. Exercise ideas include:  Taking the dog for a walk.  Taking the stairs instead of the elevator.  Dancing to your favorite song.  Doing your favorite exercise with a friend. RECOMMENDATIONS FOR EXERCISING WITH TYPE 1 OR TYPE 2 DIABETES   Check your blood glucose before exercising. If blood glucose levels are greater than 240 mg/dL, check for urine ketones. Do not exercise if ketones are present.  Avoid injecting insulin into areas of the body that are going to be exercised. For example, avoid injecting insulin into:  The arms when playing tennis.  The legs when jogging.  Keep a record of:  Food intake before and after you exercise.  Expected peak times of insulin action.  Blood glucose levels before and after you exercise.  The type and amount of exercise you have done.  Review your records with your health care provider. Your health care provider will help you to develop guidelines for adjusting food intake and insulin amounts before and after exercising.  If you take insulin or oral hypoglycemic agents, watch for signs and symptoms of hypoglycemia.  They include:  Dizziness.  Shaking.  Sweating.  Chills.  Confusion.  Drink plenty of water while you exercise to prevent dehydration or heat stroke. Body water is lost during exercise and must be replaced.  Talk to your health care provider before starting an exercise program to make sure it is safe for you. Remember, almost any type of activity is better than none.    Cholesterol Cholesterol is a white, waxy, fat-like protein needed by your body in small amounts. The liver makes all the cholesterol you need. It is carried from the liver by the blood through the blood vessels. Deposits (plaque) may build up on blood vessel walls. This makes the arteries narrower and stiffer. Plaque increases the risk for heart attack and stroke. You cannot feel your cholesterol level even if it is very high. The only way to know is by a blood test to check your lipid (fats) levels. Once you know your cholesterol levels, you should keep a record of the test results. Work with your caregiver to to keep your levels in the desired range. WHAT THE RESULTS MEAN:  Total cholesterol is a rough measure of all the cholesterol in your blood.  LDL is the so-called bad cholesterol. This is the type that deposits cholesterol in the walls of the arteries. You want this level to be low.  HDL is the good cholesterol because it cleans the arteries and carries the LDL away. You want this level to be high.  Triglycerides are fat that the body can either burn for energy or store. High levels are closely linked to heart disease. DESIRED LEVELS:  Total cholesterol below 200.  LDL below 100 for people at risk, below 70 for very high risk.  HDL above 50 is good, above 60 is best.  Triglycerides below 150. HOW TO LOWER YOUR CHOLESTEROL:  Diet.  Choose fish or white meat chicken and [place], roasted or baked. Limit fatty cuts of red meat, fried foods, and processed meats, such as sausage and lunch meat.  Eat lots of  fresh fruits and vegetables. Choose whole grains, beans, pasta, potatoes and cereals.  Use only small amounts of olive, corn or canola oils. Avoid butter, mayonnaise, shortening or palm kernel oils. Avoid foods with trans-fats.  Use skim/nonfat milk and low-fat/nonfat yogurt and cheeses. Avoid whole milk, cream, ice cream, egg yolks and cheeses. Healthy desserts include angel food cake, ginger snaps, animal crackers, hard candy, popsicles, and low-fat/nonfat frozen yogurt. Avoid pastries, cakes, pies and cookies.  Exercise.  A regular program helps decrease LDL and raises HDL.  Helps with weight control.  Do things that increase your activity level like gardening, walking, or taking the stairs.  Medication.  May be prescribed by your caregiver to help lowering cholesterol and the risk for heart disease.  You may need medicine even if your levels are normal if you have several risk factors. HOME CARE INSTRUCTIONS   Follow your diet and exercise programs as suggested by your caregiver.  Take medications as directed.  Have blood work done when your caregiver feels it is necessary. MAKE SURE YOU:   Understand these instructions.  Will watch your condition.  Will get help right away if you are not doing well or get worse.      Vitamin D Deficiency Vitamin D is an important vitamin that your body needs. Having too little of it in your body is called a deficiency. A very bad deficiency can make your bones soft and can cause a condition called rickets.  Vitamin D is important to your body for different reasons, such as:   It helps your body absorb 2 minerals called calcium and phosphorus.  It helps make your bones healthy.  It may prevent some diseases, such as diabetes and multiple sclerosis.  It helps your muscles and heart. You can get vitamin D in several ways. It is a natural part of some foods. The vitamin is also added to some dairy products and cereals. Some people  take vitamin D supplements. Also, your body makes vitamin D when you are in the sun. It changes the sun's rays into a form of the vitamin that your body can use. CAUSES   Not eating enough foods that contain vitamin D.  Not getting enough sunlight.  Having certain digestive system diseases that make it hard to absorb vitamin D. These diseases include Crohn's disease, chronic pancreatitis, and cystic fibrosis.  Having a surgery in which part of the stomach or small intestine is removed.  Being obese. Fat cells pull vitamin D out of your blood. That means that obese people may not have enough vitamin D left in their blood and in other body tissues.  Having chronic kidney or liver disease. RISK FACTORS Risk factors are things that make you more likely to develop a vitamin D deficiency. They include:  Being older.  Not being able to get outside very much.  Living in a nursing home.  Having had broken bones.  Having weak or thin bones (osteoporosis).  Having a disease or condition that changes how your body absorbs vitamin D.  Having dark skin.  Some medicines such as seizure medicines or steroids.  Being overweight or obese. SYMPTOMS Mild cases of vitamin D deficiency may not have any symptoms. If you have a very bad case, symptoms may include:  Bone pain.  Muscle pain.  Falling often.  Broken bones caused by a minor injury, due to osteoporosis. DIAGNOSIS A blood test is the best way to tell if you have a vitamin D deficiency. TREATMENT Vitamin D deficiency can be treated in different ways. Treatment for vitamin D deficiency depends on what is causing it. Options include:  Taking vitamin D supplements.  Taking a calcium supplement. Your caregiver will suggest what dose is best for you. HOME CARE INSTRUCTIONS  Take any supplements that your caregiver prescribes. Follow the directions carefully. Take only the suggested amount.  Have your blood tested 2 months after  you start taking supplements.  Eat foods that contain vitamin D. Healthy choices include:  Fortified dairy products, cereals, or juices. Fortified means vitamin D has been added to the food. Check the label on the package to be sure.  Fatty fish like salmon or trout.  Eggs.  Oysters.  Do not use a tanning bed.  Keep your weight at a healthy level. Lose weight if you need to.  Keep all follow-up appointments. Your caregiver will need to perform blood tests to make sure your vitamin D deficiency is going away. SEEK MEDICAL CARE IF:  You have any questions about your treatment.  You continue to have symptoms of vitamin D deficiency.  You have nausea or vomiting.  You are constipated.  You feel confused.  You have severe abdominal or back pain. MAKE SURE YOU:  Understand these instructions.  Will watch your condition.  Will get help right away if you are not doing well or get worse.

## 2014-02-09 LAB — CBC WITH DIFFERENTIAL/PLATELET
Basophils Absolute: 0.1 10*3/uL (ref 0.0–0.1)
Basophils Relative: 1 % (ref 0–1)
Eosinophils Absolute: 0.3 10*3/uL (ref 0.0–0.7)
Eosinophils Relative: 4 % (ref 0–5)
HCT: 45.1 % (ref 39.0–52.0)
Hemoglobin: 15.8 g/dL (ref 13.0–17.0)
Lymphocytes Relative: 29 % (ref 12–46)
Lymphs Abs: 2.4 10*3/uL (ref 0.7–4.0)
MCH: 31.4 pg (ref 26.0–34.0)
MCHC: 35 g/dL (ref 30.0–36.0)
MCV: 89.7 fL (ref 78.0–100.0)
Monocytes Absolute: 0.9 10*3/uL (ref 0.1–1.0)
Monocytes Relative: 11 % (ref 3–12)
Neutro Abs: 4.5 10*3/uL (ref 1.7–7.7)
Neutrophils Relative %: 55 % (ref 43–77)
RBC: 5.03 MIL/uL (ref 4.22–5.81)
RDW: 14 % (ref 11.5–15.5)
WBC: 8.1 10*3/uL (ref 4.0–10.5)

## 2014-02-09 LAB — TESTOSTERONE: Testosterone: 223 ng/dL — ABNORMAL LOW (ref 300–890)

## 2014-02-09 LAB — HEPATIC FUNCTION PANEL
ALT: 29 U/L (ref 0–53)
AST: 19 U/L (ref 0–37)
Albumin: 4.3 g/dL (ref 3.5–5.2)
Alkaline Phosphatase: 65 U/L (ref 39–117)
Bilirubin, Direct: 0.1 mg/dL (ref 0.0–0.3)
Indirect Bilirubin: 0.4 mg/dL (ref 0.2–1.2)
Total Bilirubin: 0.5 mg/dL (ref 0.2–1.2)
Total Protein: 6.5 g/dL (ref 6.0–8.3)

## 2014-02-09 LAB — BASIC METABOLIC PANEL WITH GFR
BUN: 13 mg/dL (ref 6–23)
CO2: 29 mEq/L (ref 19–32)
Calcium: 9.6 mg/dL (ref 8.4–10.5)
Chloride: 105 mEq/L (ref 96–112)
Creat: 1.03 mg/dL (ref 0.50–1.35)
GFR, Est African American: >89 mL/min
GFR, Est Non African American: 88 mL/min
Glucose, Bld: 81 mg/dL (ref 70–99)
Potassium: 4 mEq/L (ref 3.5–5.3)
Sodium: 140 mEq/L (ref 135–145)

## 2014-02-09 LAB — HEMOGLOBIN A1C
Hgb A1c MFr Bld: 5.4 % (ref <5.7)
Mean Plasma Glucose: 108 mg/dL (ref <117)

## 2014-02-09 LAB — TSH: TSH: 1.694 u[IU]/mL (ref 0.350–4.500)

## 2014-02-09 LAB — LIPID PANEL
Cholesterol: 132 mg/dL (ref 0–200)
HDL: 32 mg/dL — ABNORMAL LOW (ref >39)
LDL Cholesterol: 58 mg/dL (ref 0–99)
Total CHOL/HDL Ratio: 4.1 Ratio
Triglycerides: 208 mg/dL — ABNORMAL HIGH (ref <150)
VLDL: 42 mg/dL — ABNORMAL HIGH (ref 0–40)

## 2014-02-09 LAB — VITAMIN D 25 HYDROXY (VIT D DEFICIENCY, FRACTURES): Vit D, 25-Hydroxy: 48 ng/mL (ref 30–89)

## 2014-02-09 LAB — MAGNESIUM: Magnesium: 1.9 mg/dL (ref 1.5–2.5)

## 2014-02-09 LAB — INSULIN, FASTING: Insulin fasting, serum: 98 u[IU]/mL — ABNORMAL HIGH (ref 3–28)

## 2014-04-03 ENCOUNTER — Other Ambulatory Visit: Payer: Self-pay | Admitting: Internal Medicine

## 2014-05-04 ENCOUNTER — Other Ambulatory Visit: Payer: Self-pay | Admitting: Emergency Medicine

## 2014-05-25 ENCOUNTER — Ambulatory Visit (INDEPENDENT_AMBULATORY_CARE_PROVIDER_SITE_OTHER): Payer: 59 | Admitting: Emergency Medicine

## 2014-05-25 ENCOUNTER — Encounter: Payer: Self-pay | Admitting: Emergency Medicine

## 2014-05-25 VITALS — BP 138/92 | HR 68 | Temp 98.2°F | Resp 16 | Wt 252.8 lb

## 2014-05-25 DIAGNOSIS — R030 Elevated blood-pressure reading, without diagnosis of hypertension: Secondary | ICD-10-CM

## 2014-05-25 DIAGNOSIS — E782 Mixed hyperlipidemia: Secondary | ICD-10-CM

## 2014-05-25 DIAGNOSIS — R7309 Other abnormal glucose: Secondary | ICD-10-CM

## 2014-05-25 MED ORDER — CITALOPRAM HYDROBROMIDE 40 MG PO TABS: ORAL_TABLET | ORAL | Status: DC

## 2014-05-25 MED ORDER — LEVOMILNACIPRAN HCL ER 40 MG PO CP24: ORAL_CAPSULE | ORAL | Status: DC

## 2014-05-25 NOTE — Patient Instructions (Signed)
Hypertension Hypertension is another name for high blood pressure. High blood pressure may mean that your heart needs to work harder to pump blood. Blood pressure consists of two numbers, which includes a higher number over a lower number (example: 110/72). HOME CARE   Make lifestyle changes as told by your doctor. This may include weight loss and exercise.  Take your blood pressure medicine every day.  Limit how much salt you use.  Stop smoking if you smoke.  Do not use drugs.  Talk to your doctor if you are using decongestants or birth control pills. These medicines might make blood pressure higher.  Females should not drink more than 1 alcoholic drink per day. Males should not drink more than 2 alcoholic drinks per day.  See your doctor as told. GET HELP RIGHT AWAY IF:   You have a blood pressure reading with a top number of 180 or higher.  You get a very bad headache.  You get blurred or changing vision.  You feel confused.  You feel weak, numb, or faint.  You get chest or belly (abdominal) pain.  You throw up (vomit).  You cannot breathe very well. MAKE SURE YOU:   Understand these instructions.  Will watch your condition.  Will get help right away if you are not doing well or get worse. Document Released: 06/02/2008 Document Revised: 03/08/2012 Document Reviewed: 06/02/2008 [employer] Patient Information 2014 [employer].  

## 2014-05-25 NOTE — Progress Notes (Signed)
Subjective:    Patient ID: Shane Saunders, male    DOB: 30-Jul-1971, 43 y.o.   MRN: [id number]  HPI Comments: 43 yo WM presents for 3 month F/U for HTN, Cholesterol, Pre-Dm, D. Deficient. He is not checking BP. He is exercising at work only. He is not eating healthy. He notes depression had increased with stress at work but has improved with increasing celexa to 60 mg (1.5 tabs) but he notes he is still a little fatigued. He notes difficulty with ejaculation/ libido with Celexa even at 40 mg.  WBC             8.1   02/08/2014 HGB            15.8   02/08/2014 HCT            45.1   02/08/2014 PLT          *   02/08/2014   Comment: Platelet clumps noted on smear-unable to estimate. GLUCOSE          81   02/08/2014 CHOL            132   02/08/2014 TRIG            208   02/08/2014 HDL              32   02/08/2014 LDLCALC          58   02/08/2014 ALT              29   02/08/2014 AST              19   02/08/2014 NA              140   02/08/2014 K               4.0   02/08/2014 CL              105   02/08/2014 CREATININE     1.03   02/08/2014 BUN              13   02/08/2014 CO2              29   02/08/2014 TSH           1.694   02/08/2014 HGBA1C          5.4   02/08/2014      Medication List       This list is accurate as of: 05/25/14  3:42 PM.  Always use your most recent med list.               ALPRAZolam 2 MG 24 hr tablet  Commonly known as:  XANAX XR  TAKE 1 TABLET BY MOUTH DAILY     citalopram 40 MG tablet  Commonly known as:  CELEXA  TAKE 1&1/2 TABLET BY MOUTH EVERY DAY FOR MOOD     LIPITOR 80 MG tablet  Generic drug:  atorvastatin  Take 80 mg by mouth daily. Takes 1/2 daily     meloxicam 15 MG tablet  Commonly known as:  MOBIC  1/2 to 1 tablet daily with food for Pain and inflammation     VITAMIN D PO  Take 2,000 Units by mouth. Takes 8000 units daily       Allergies  Allergen Reactions  . Effexor [Venlafaxine]   . Pravastatin   . Prednisone     Decrease sleep  . Zinc  Past Medical History  Diagnosis Date  . Hypertension   . Hyperlipidemia   . OSA on CPAP   . Prediabetes   . Obesity (BMI 30-39.9)   . Hypogonadism male   . Vitamin D deficiency   . Anemia      Review of Systems  Constitutional: Positive for fatigue.  Genitourinary:       ED  All other systems reviewed and are negative.  BP 138/92  Pulse 68  Temp(Src) 98.2 F (36.8 C)  Resp 16  Wt 252 lb 12.8 oz (114.669 kg)     Objective:   Physical Exam  Nursing note and vitals reviewed. Constitutional: He is oriented to person, place, and time. He appears well-developed and well-nourished.  overweight  HENT:  Head: Normocephalic and atraumatic.  Right Ear: External ear normal.  Left Ear: External ear normal.  Nose: Nose normal.  Eyes: Conjunctivae and EOM are normal.  Neck: Normal range of motion. Neck supple. No JVD present. No thyromegaly present.  Cardiovascular: Normal rate, regular rhythm, normal heart sounds and intact distal pulses.   Pulmonary/Chest: Effort normal and breath sounds normal.  Abdominal: Soft. Bowel sounds are normal. He exhibits no distension. There is no tenderness. There is no rebound.  Musculoskeletal: Normal range of motion. He exhibits no edema and no tenderness.  Lymphadenopathy:    He has no cervical adenopathy.  Neurological: He is alert and oriented to person, place, and time. No cranial nerve deficit. Coordination normal.  Skin: Skin is warm and dry.  Psychiatric: He has a normal mood and affect. His behavior is normal. Judgment and thought content normal.          Assessment & Plan:  1.  3 month F/U for HTN, Cholesterol, Pre-Dm, D. Deficient. Needs healthy diet, cardio QD and obtain healthy weight. Check Labs, Check BP if >130/80 call office   2. Depression-D/c Celexa due to sexual SE and try Fetzima 20 mg x 6 days then 40 mg, SX given with meal. ADvised to monitor for mood changes and to make wife aware of medication change.

## 2014-05-26 LAB — LIPID PANEL
Cholesterol: 143 mg/dL (ref 0–200)
HDL: 36 mg/dL — ABNORMAL LOW (ref >39)
LDL Cholesterol: 86 mg/dL (ref 0–99)
Total CHOL/HDL Ratio: 4 Ratio
Triglycerides: 104 mg/dL (ref <150)
VLDL: 21 mg/dL (ref 0–40)

## 2014-05-26 LAB — HEPATIC FUNCTION PANEL
ALT: 36 U/L (ref 0–53)
AST: 24 U/L (ref 0–37)
Albumin: 4.7 g/dL (ref 3.5–5.2)
Alkaline Phosphatase: 68 U/L (ref 39–117)
Bilirubin, Direct: 0.1 mg/dL (ref 0.0–0.3)
Indirect Bilirubin: 0.5 mg/dL (ref 0.2–1.2)
Total Bilirubin: 0.6 mg/dL (ref 0.2–1.2)
Total Protein: 6.8 g/dL (ref 6.0–8.3)

## 2014-05-26 LAB — CBC WITH DIFFERENTIAL/PLATELET
Basophils Absolute: 0.1 10*3/uL (ref 0.0–0.1)
Basophils Relative: 1 % (ref 0–1)
Eosinophils Absolute: 0.3 10*3/uL (ref 0.0–0.7)
Eosinophils Relative: 6 % — ABNORMAL HIGH (ref 0–5)
HCT: 41.4 % (ref 39.0–52.0)
Hemoglobin: 14.7 g/dL (ref 13.0–17.0)
Lymphocytes Relative: 41 % (ref 12–46)
Lymphs Abs: 2.4 10*3/uL (ref 0.7–4.0)
MCH: 31 pg (ref 26.0–34.0)
MCHC: 35.5 g/dL (ref 30.0–36.0)
MCV: 87.3 fL (ref 78.0–100.0)
Monocytes Absolute: 0.5 10*3/uL (ref 0.1–1.0)
Monocytes Relative: 8 % (ref 3–12)
Neutro Abs: 2.6 10*3/uL (ref 1.7–7.7)
Neutrophils Relative %: 44 % (ref 43–77)
RBC: 4.74 MIL/uL (ref 4.22–5.81)
RDW: 13.8 % (ref 11.5–15.5)
WBC: 5.8 10*3/uL (ref 4.0–10.5)

## 2014-05-26 LAB — BASIC METABOLIC PANEL WITH GFR
BUN: 12 mg/dL (ref 6–23)
CO2: 27 mEq/L (ref 19–32)
Calcium: 9.1 mg/dL (ref 8.4–10.5)
Chloride: 105 mEq/L (ref 96–112)
Creat: 0.97 mg/dL (ref 0.50–1.35)
GFR, Est African American: >89 mL/min
GFR, Est Non African American: >89 mL/min
Glucose, Bld: 82 mg/dL (ref 70–99)
Potassium: 3.9 mEq/L (ref 3.5–5.3)
Sodium: 141 mEq/L (ref 135–145)

## 2014-05-26 LAB — HEMOGLOBIN A1C
Hgb A1c MFr Bld: 5.5 % (ref <5.7)
Mean Plasma Glucose: 111 mg/dL (ref <117)

## 2014-05-31 ENCOUNTER — Observation Stay (HOSPITAL_COMMUNITY): Payer: Worker's Compensation | Admitting: Anesthesiology

## 2014-05-31 ENCOUNTER — Inpatient Hospital Stay (HOSPITAL_COMMUNITY)
Admission: EM | Admit: 2014-05-31 | Discharge: 2014-06-04 | DRG: 605 | Disposition: A | Discharge status: Home Health | Payer: Worker's Compensation | Attending: General Surgery | Admitting: General Surgery

## 2014-05-31 ENCOUNTER — Observation Stay (HOSPITAL_COMMUNITY): Payer: Worker's Compensation

## 2014-05-31 ENCOUNTER — Encounter (HOSPITAL_COMMUNITY): Admission: EM | Disposition: A | Discharge status: Home Health | Payer: Self-pay | Source: Home / Self Care

## 2014-05-31 ENCOUNTER — Encounter (HOSPITAL_COMMUNITY): Payer: Worker's Compensation | Admitting: Anesthesiology

## 2014-05-31 ENCOUNTER — Emergency Department (HOSPITAL_COMMUNITY): Payer: Worker's Compensation

## 2014-05-31 ENCOUNTER — Encounter (HOSPITAL_COMMUNITY): Payer: Self-pay | Admitting: Emergency Medicine

## 2014-05-31 DIAGNOSIS — E669 Obesity, unspecified: Secondary | ICD-10-CM | POA: Diagnosis present

## 2014-05-31 DIAGNOSIS — D62 Acute posthemorrhagic anemia: Secondary | ICD-10-CM | POA: Diagnosis present

## 2014-05-31 DIAGNOSIS — F329 Major depressive disorder, single episode, unspecified: Secondary | ICD-10-CM | POA: Diagnosis present

## 2014-05-31 DIAGNOSIS — Z6839 Body mass index (BMI) 39.0-39.9, adult: Secondary | ICD-10-CM

## 2014-05-31 DIAGNOSIS — F3289 Other specified depressive episodes: Secondary | ICD-10-CM | POA: Diagnosis present

## 2014-05-31 DIAGNOSIS — E785 Hyperlipidemia, unspecified: Secondary | ICD-10-CM | POA: Diagnosis present

## 2014-05-31 DIAGNOSIS — S59911A Unspecified injury of right forearm, initial encounter: Secondary | ICD-10-CM

## 2014-05-31 DIAGNOSIS — S51809A Unspecified open wound of unspecified forearm, initial encounter: Principal | ICD-10-CM | POA: Diagnosis present

## 2014-05-31 DIAGNOSIS — S59919A Unspecified injury of unspecified forearm, initial encounter: Secondary | ICD-10-CM

## 2014-05-31 DIAGNOSIS — W230XXA Caught, crushed, jammed, or pinched between moving objects, initial encounter: Secondary | ICD-10-CM | POA: Diagnosis present

## 2014-05-31 DIAGNOSIS — I959 Hypotension, unspecified: Secondary | ICD-10-CM | POA: Diagnosis present

## 2014-05-31 DIAGNOSIS — S59909A Unspecified injury of unspecified elbow, initial encounter: Secondary | ICD-10-CM

## 2014-05-31 DIAGNOSIS — Y9269 Other specified industrial and construction area as the place of occurrence of the external cause: Secondary | ICD-10-CM

## 2014-05-31 DIAGNOSIS — S6990XA Unspecified injury of unspecified wrist, hand and finger(s), initial encounter: Secondary | ICD-10-CM

## 2014-05-31 DIAGNOSIS — S45909A Unspecified injury of unspecified blood vessel at shoulder and upper arm level, unspecified arm, initial encounter: Secondary | ICD-10-CM

## 2014-05-31 DIAGNOSIS — M79609 Pain in unspecified limb: Secondary | ICD-10-CM | POA: Diagnosis not present

## 2014-05-31 DIAGNOSIS — F411 Generalized anxiety disorder: Secondary | ICD-10-CM | POA: Diagnosis present

## 2014-05-31 DIAGNOSIS — G4730 Sleep apnea, unspecified: Secondary | ICD-10-CM | POA: Diagnosis present

## 2014-05-31 DIAGNOSIS — Z79899 Other long term (current) drug therapy: Secondary | ICD-10-CM

## 2014-05-31 DIAGNOSIS — F17200 Nicotine dependence, unspecified, uncomplicated: Secondary | ICD-10-CM | POA: Diagnosis present

## 2014-05-31 HISTORY — PX: ARTERY REPAIR: SHX559

## 2014-05-31 LAB — COMPREHENSIVE METABOLIC PANEL
ALT: 36 U/L (ref 0–53)
AST: 23 U/L (ref 0–37)
Albumin: 4.1 g/dL (ref 3.5–5.2)
Alkaline Phosphatase: 72 U/L (ref 39–117)
BUN: 13 mg/dL (ref 6–23)
CO2: 25 mEq/L (ref 19–32)
Calcium: 9.2 mg/dL (ref 8.4–10.5)
Chloride: 105 mEq/L (ref 96–112)
Creatinine, Ser: 1.04 mg/dL (ref 0.50–1.35)
GFR calc Af Amer: >90 mL/min (ref >90)
GFR calc non Af Amer: 86 mL/min — ABNORMAL LOW (ref >90)
Glucose, Bld: 134 mg/dL — ABNORMAL HIGH (ref 70–99)
Potassium: 3.8 mEq/L (ref 3.7–5.3)
Sodium: 140 mEq/L (ref 137–147)
Total Bilirubin: 0.6 mg/dL (ref 0.3–1.2)
Total Protein: 6.7 g/dL (ref 6.0–8.3)

## 2014-05-31 LAB — I-STAT CHEM 8, ED
BUN: 13 mg/dL (ref 6–23)
Calcium, Ion: 1.22 mmol/L (ref 1.12–1.23)
Chloride: 99 mEq/L (ref 96–112)
Creatinine, Ser: 1.1 mg/dL (ref 0.50–1.35)
Glucose, Bld: 135 mg/dL — ABNORMAL HIGH (ref 70–99)
HCT: 44 % (ref 39.0–52.0)
Hemoglobin: 15 g/dL (ref 13.0–17.0)
Potassium: 3.7 mEq/L (ref 3.7–5.3)
Sodium: 143 mEq/L (ref 137–147)
TCO2: 25 mmol/L (ref 0–100)

## 2014-05-31 LAB — PREPARE FRESH FROZEN PLASMA
Unit division: 0
Unit division: 0

## 2014-05-31 LAB — CBC
HCT: 41.8 % (ref 39.0–52.0)
Hemoglobin: 14.9 g/dL (ref 13.0–17.0)
MCH: 31.4 pg (ref 26.0–34.0)
MCHC: 35.6 g/dL (ref 30.0–36.0)
MCV: 88.2 fL (ref 78.0–100.0)
Platelets: UNDETERMINED 10*3/uL (ref 150–400)
RBC: 4.74 MIL/uL (ref 4.22–5.81)
RDW: 12.7 % (ref 11.5–15.5)
WBC: 9.7 10*3/uL (ref 4.0–10.5)

## 2014-05-31 LAB — PROTIME-INR
INR: 1.03 (ref 0.00–1.49)
Prothrombin Time: 13.3 seconds (ref 11.6–15.2)

## 2014-05-31 LAB — ETHANOL: Alcohol, Ethyl (B): <11 mg/dL (ref 0–11)

## 2014-05-31 LAB — ABO/RH: ABO/RH(D): A POS

## 2014-05-31 LAB — MRSA PCR SCREENING: MRSA by PCR: NEGATIVE

## 2014-05-31 SURGERY — REPAIR, ARTERY, BRACHIAL
Anesthesia: Regional | Site: Arm Lower | Laterality: Right

## 2014-05-31 MED ORDER — POVIDONE-IODINE 10 % EX OINT
TOPICAL_OINTMENT | CUTANEOUS | Status: DC | PRN
  Administered 2014-05-31: 1 via TOPICAL

## 2014-05-31 MED ORDER — CEFAZOLIN SODIUM-DEXTROSE 2-3 GM-% IV SOLR
2.0000 g | Freq: Once | INTRAVENOUS | Status: AC
Start: 2014-05-31 — End: 2014-05-31
  Administered 2014-05-31: 2 g via INTRAVENOUS

## 2014-05-31 MED ORDER — ACETAMINOPHEN 160 MG/5ML PO SOLN
325.0000 mg | ORAL | Status: DC | PRN
Start: 2014-05-31 — End: 2014-05-31

## 2014-05-31 MED ORDER — ALPRAZOLAM 0.5 MG PO TABS
2.0000 mg | ORAL_TABLET | Freq: Every evening | ORAL | Status: DC | PRN
  Administered 2014-05-31 – 2014-06-02 (×3): 2 mg via ORAL
  Administered 2014-06-03: 1 mg via ORAL
  Filled 2014-05-31 (×4): qty 4

## 2014-05-31 MED ORDER — MIDAZOLAM HCL 5 MG/5ML IJ SOLN
INTRAMUSCULAR | Status: DC | PRN
  Administered 2014-05-31: 2 mg via INTRAVENOUS

## 2014-05-31 MED ORDER — FENTANYL CITRATE 0.05 MG/ML IJ SOLN
50.0000 ug | Freq: Once | INTRAMUSCULAR | Status: AC
  Administered 2014-05-31: 50 ug via INTRAVENOUS
  Filled 2014-05-31: qty 2

## 2014-05-31 MED ORDER — ROCURONIUM BROMIDE 100 MG/10ML IV SOLN
INTRAVENOUS | Status: DC | PRN
  Administered 2014-05-31: 20 mg via INTRAVENOUS

## 2014-05-31 MED ORDER — ROCURONIUM BROMIDE 50 MG/5ML IV SOLN
INTRAVENOUS | Status: AC
  Filled 2014-05-31: qty 1

## 2014-05-31 MED ORDER — OXYCODONE HCL 5 MG/5ML PO SOLN: 5.0000 mg | Freq: Once | ORAL | Status: DC | PRN

## 2014-05-31 MED ORDER — FENTANYL CITRATE 0.05 MG/ML IJ SOLN
INTRAMUSCULAR | Status: DC | PRN
  Administered 2014-05-31: 100 ug via INTRAVENOUS
  Administered 2014-05-31 (×2): 50 ug via INTRAVENOUS

## 2014-05-31 MED ORDER — CEFAZOLIN SODIUM-DEXTROSE 2-3 GM-% IV SOLR
2.0000 g | Freq: Once | INTRAVENOUS | Status: DC
  Filled 2014-05-31: qty 50

## 2014-05-31 MED ORDER — ONDANSETRON HCL 4 MG/2ML IJ SOLN
INTRAMUSCULAR | Status: AC
  Filled 2014-05-31: qty 2

## 2014-05-31 MED ORDER — IOHEXOL 350 MG/ML SOLN
100.0000 mL | Freq: Once | INTRAVENOUS | Status: AC | PRN
  Administered 2014-05-31: 100 mL via INTRAVENOUS

## 2014-05-31 MED ORDER — TETANUS-DIPHTH-ACELL PERTUSSIS 5-2.5-18.5 LF-MCG/0.5 IM SUSP
0.5000 mL | Freq: Once | INTRAMUSCULAR | Status: AC
  Administered 2014-05-31: 0.5 mL via INTRAMUSCULAR

## 2014-05-31 MED ORDER — SODIUM CHLORIDE 0.9 % IV BOLUS (SEPSIS)
1000.0000 mL | Freq: Once | INTRAVENOUS | Status: AC
  Administered 2014-05-31: 1000 mL via INTRAVENOUS

## 2014-05-31 MED ORDER — MIDAZOLAM HCL 2 MG/2ML IJ SOLN
INTRAMUSCULAR | Status: AC
  Filled 2014-05-31: qty 2

## 2014-05-31 MED ORDER — PROMETHAZINE HCL 25 MG/ML IJ SOLN: 6.2500 mg | INTRAMUSCULAR | Status: DC | PRN

## 2014-05-31 MED ORDER — PROPOFOL 10 MG/ML IV BOLUS
INTRAVENOUS | Status: AC
  Filled 2014-05-31: qty 20

## 2014-05-31 MED ORDER — NEOSTIGMINE METHYLSULFATE 10 MG/10ML IV SOLN
INTRAVENOUS | Status: DC | PRN
  Administered 2014-05-31: 3 mg via INTRAVENOUS

## 2014-05-31 MED ORDER — ACETAMINOPHEN 325 MG PO TABS: 325.0000 mg | ORAL_TABLET | ORAL | Status: DC | PRN

## 2014-05-31 MED ORDER — NEOSTIGMINE METHYLSULFATE 10 MG/10ML IV SOLN
INTRAVENOUS | Status: AC
  Filled 2014-05-31: qty 1

## 2014-05-31 MED ORDER — LIDOCAINE HCL (CARDIAC) 20 MG/ML IV SOLN
INTRAVENOUS | Status: AC
  Filled 2014-05-31: qty 5

## 2014-05-31 MED ORDER — EPHEDRINE SULFATE 50 MG/ML IJ SOLN
INTRAMUSCULAR | Status: DC | PRN
Start: 2014-05-31 — End: 2014-05-31
  Administered 2014-05-31: 10 mg via INTRAVENOUS

## 2014-05-31 MED ORDER — ONDANSETRON HCL 4 MG/2ML IJ SOLN
4.0000 mg | Freq: Four times a day (QID) | INTRAMUSCULAR | Status: DC | PRN
  Administered 2014-06-01 – 2014-06-02 (×2): 4 mg via INTRAVENOUS
  Filled 2014-05-31 (×2): qty 2

## 2014-05-31 MED ORDER — LIDOCAINE HCL (CARDIAC) 20 MG/ML IV SOLN
INTRAVENOUS | Status: DC | PRN
  Administered 2014-05-31: 80 mg via INTRAVENOUS

## 2014-05-31 MED ORDER — PHENYLEPHRINE HCL 10 MG/ML IJ SOLN
10.0000 mg | INTRAVENOUS | Status: DC | PRN
  Administered 2014-05-31: 15 ug/min via INTRAVENOUS

## 2014-05-31 MED ORDER — ONDANSETRON HCL 4 MG/2ML IJ SOLN
INTRAMUSCULAR | Status: DC | PRN
  Administered 2014-05-31: 4 mg via INTRAVENOUS

## 2014-05-31 MED ORDER — ALBUTEROL SULFATE HFA 108 (90 BASE) MCG/ACT IN AERS
INHALATION_SPRAY | RESPIRATORY_TRACT | Status: DC | PRN
  Administered 2014-05-31: 6 via RESPIRATORY_TRACT

## 2014-05-31 MED ORDER — ONDANSETRON HCL 4 MG PO TABS
4.0000 mg | ORAL_TABLET | Freq: Four times a day (QID) | ORAL | Status: DC | PRN
Start: 2014-05-31 — End: 2014-06-04

## 2014-05-31 MED ORDER — FENTANYL CITRATE 0.05 MG/ML IJ SOLN
50.0000 ug | Freq: Once | INTRAMUSCULAR | Status: AC
Start: 2014-05-31 — End: 2014-05-31
  Administered 2014-05-31: 50 ug via INTRAVENOUS

## 2014-05-31 MED ORDER — SODIUM CHLORIDE 0.9 % IV SOLN
INTRAVENOUS | Status: DC
  Administered 2014-05-31 – 2014-06-01 (×2): via INTRAVENOUS

## 2014-05-31 MED ORDER — ALBUMIN HUMAN 5 % IV SOLN
INTRAVENOUS | Status: DC | PRN
  Administered 2014-05-31: 14:00:00 via INTRAVENOUS

## 2014-05-31 MED ORDER — OXYCODONE HCL 5 MG PO TABS: 5.0000 mg | ORAL_TABLET | Freq: Once | ORAL | Status: DC | PRN

## 2014-05-31 MED ORDER — FENTANYL CITRATE 0.05 MG/ML IJ SOLN
25.0000 ug | INTRAMUSCULAR | Status: DC | PRN
  Administered 2014-06-01 (×5): 50 ug via INTRAVENOUS
  Filled 2014-05-31 (×5): qty 2

## 2014-05-31 MED ORDER — SODIUM CHLORIDE 0.9 % IR SOLN
Status: DC | PRN
  Administered 2014-05-31: 12:00:00

## 2014-05-31 MED ORDER — LACTATED RINGERS IV SOLN
INTRAVENOUS | Status: DC | PRN
  Administered 2014-05-31 (×2): via INTRAVENOUS

## 2014-05-31 MED ORDER — CEFAZOLIN SODIUM-DEXTROSE 2-3 GM-% IV SOLR
2.0000 g | Freq: Three times a day (TID) | INTRAVENOUS | Status: DC
  Administered 2014-05-31 – 2014-06-01 (×2): 2 g via INTRAVENOUS
  Filled 2014-05-31 (×4): qty 50

## 2014-05-31 MED ORDER — FENTANYL CITRATE 0.05 MG/ML IJ SOLN
INTRAMUSCULAR | Status: AC
  Filled 2014-05-31: qty 5

## 2014-05-31 MED ORDER — SODIUM CHLORIDE 0.9 % IR SOLN
Status: DC | PRN
  Administered 2014-05-31: 3000 mL

## 2014-05-31 MED ORDER — 0.9 % SODIUM CHLORIDE (POUR BTL) OPTIME
TOPICAL | Status: DC | PRN
  Administered 2014-05-31: 1000 mL

## 2014-05-31 MED ORDER — HYDROMORPHONE HCL PF 1 MG/ML IJ SOLN: 0.2500 mg | INTRAMUSCULAR | Status: DC | PRN

## 2014-05-31 MED ORDER — FENTANYL CITRATE 0.05 MG/ML IJ SOLN
100.0000 ug | Freq: Once | INTRAMUSCULAR | Status: AC
  Administered 2014-05-31: 100 ug via INTRAVENOUS

## 2014-05-31 MED ORDER — TETANUS-DIPHTH-ACELL PERTUSSIS 5-2.5-18.5 LF-MCG/0.5 IM SUSP
0.5000 mL | Freq: Once | INTRAMUSCULAR | Status: DC
  Filled 2014-05-31: qty 0.5

## 2014-05-31 MED ORDER — GLYCOPYRROLATE 0.2 MG/ML IJ SOLN
INTRAMUSCULAR | Status: DC | PRN
  Administered 2014-05-31: 0.4 mg via INTRAVENOUS

## 2014-05-31 MED ORDER — PROPOFOL 10 MG/ML IV BOLUS
INTRAVENOUS | Status: DC | PRN
  Administered 2014-05-31: 180 mg via INTRAVENOUS

## 2014-05-31 MED ORDER — GLYCOPYRROLATE 0.2 MG/ML IJ SOLN
INTRAMUSCULAR | Status: AC
  Filled 2014-05-31: qty 2

## 2014-05-31 MED ORDER — ROPIVACAINE HCL 5 MG/ML IJ SOLN
INTRAMUSCULAR | Status: DC | PRN
  Administered 2014-05-31: 20 mL via PERINEURAL

## 2014-05-31 MED ORDER — SUCCINYLCHOLINE CHLORIDE 20 MG/ML IJ SOLN
INTRAMUSCULAR | Status: DC | PRN
  Administered 2014-05-31: 100 mg via INTRAVENOUS
  Administered 2014-05-31: 40 mg via INTRAVENOUS

## 2014-05-31 MED ORDER — FENTANYL CITRATE 0.05 MG/ML IJ SOLN
INTRAMUSCULAR | Status: AC
  Filled 2014-05-31: qty 2

## 2014-05-31 MED ORDER — ARTIFICIAL TEARS OP OINT
TOPICAL_OINTMENT | OPHTHALMIC | Status: DC | PRN
  Administered 2014-05-31: 1 via OPHTHALMIC

## 2014-05-31 SURGICAL SUPPLY — 60 items
BANDAGE ELASTIC 6 VELCRO ST LF (GAUZE/BANDAGES/DRESSINGS) ×3 IMPLANT
BLADE 10 SAFETY STRL DISP (BLADE) ×3 IMPLANT
BLADE SURG 11 STRL SS (BLADE) ×3 IMPLANT
BLADE SURG 15 STRL LF DISP TIS (BLADE) ×2 IMPLANT
BLADE SURG 15 STRL SS (BLADE) ×1
BNDG ESMARK 4X9 LF (GAUZE/BANDAGES/DRESSINGS) ×3 IMPLANT
CANISTER SUCTION 2500CC (MISCELLANEOUS) ×3 IMPLANT
CATH EMB 3FR 80CM (CATHETERS) ×3 IMPLANT
CLIP TI MEDIUM 6 (CLIP) ×6 IMPLANT
CLIP TI WIDE RED SMALL 6 (CLIP) ×6 IMPLANT
COVER SURGICAL LIGHT HANDLE (MISCELLANEOUS) ×3 IMPLANT
CUFF TOURNIQUET SINGLE 18IN (TOURNIQUET CUFF) ×3 IMPLANT
CUFF TOURNIQUET SINGLE 24IN (TOURNIQUET CUFF) IMPLANT
CUFF TOURNIQUET SINGLE 34IN LL (TOURNIQUET CUFF) IMPLANT
DERMABOND ADVANCED (GAUZE/BANDAGES/DRESSINGS) ×1
DERMABOND ADVANCED .7 DNX12 (GAUZE/BANDAGES/DRESSINGS) ×2 IMPLANT
DRAIN PENROSE 1/2X12 LTX STRL (WOUND CARE) ×3 IMPLANT
DRSG PAD ABDOMINAL 8X10 ST (GAUZE/BANDAGES/DRESSINGS) ×3 IMPLANT
ELECT REM PT RETURN 9FT ADLT (ELECTROSURGICAL) ×3
ELECTRODE REM PT RTRN 9FT ADLT (ELECTROSURGICAL) ×2 IMPLANT
GLOVE BIO SURGEON STRL SZ 6.5 (GLOVE) ×3 IMPLANT
GLOVE BIOGEL PI IND STRL 6.5 (GLOVE) ×4 IMPLANT
GLOVE BIOGEL PI IND STRL 7.0 (GLOVE) ×6 IMPLANT
GLOVE BIOGEL PI IND STRL 8 (GLOVE) ×2 IMPLANT
GLOVE BIOGEL PI INDICATOR 6.5 (GLOVE) ×2
GLOVE BIOGEL PI INDICATOR 7.0 (GLOVE) ×3
GLOVE BIOGEL PI INDICATOR 8 (GLOVE) ×1
GLOVE ECLIPSE 7.5 STRL STRAW (GLOVE) ×3 IMPLANT
GLOVE SS BIOGEL STRL SZ 6.5 (GLOVE) ×4 IMPLANT
GLOVE SS BIOGEL STRL SZ 7 (GLOVE) ×2 IMPLANT
GLOVE SUPERSENSE BIOGEL SZ 6.5 (GLOVE) ×2
GLOVE SUPERSENSE BIOGEL SZ 7 (GLOVE) ×1
GOWN STRL REUS W/ TWL LRG LVL3 (GOWN DISPOSABLE) ×6 IMPLANT
GOWN STRL REUS W/TWL LRG LVL3 (GOWN DISPOSABLE) ×3
KIT BASIN OR (CUSTOM PROCEDURE TRAY) ×3 IMPLANT
KIT ROOM TURNOVER OR (KITS) ×3 IMPLANT
NS IRRIG 1000ML POUR BTL (IV SOLUTION) ×3 IMPLANT
PACK CV ACCESS (CUSTOM PROCEDURE TRAY) ×3 IMPLANT
PAD ARMBOARD 7.5X6 YLW CONV (MISCELLANEOUS) ×6 IMPLANT
PAD CAST 4YDX4 CTTN HI CHSV (CAST SUPPLIES) IMPLANT
PADDING CAST COTTON 4X4 STRL (CAST SUPPLIES)
PADDING CAST COTTON 6X4 STRL (CAST SUPPLIES) ×3 IMPLANT
SPLINT FIBERGLASS 4X30 (CAST SUPPLIES) ×3 IMPLANT
SPONGE GAUZE 4X4 12PLY (GAUZE/BANDAGES/DRESSINGS) ×3 IMPLANT
SPONGE GAUZE 4X4 12PLY STER LF (GAUZE/BANDAGES/DRESSINGS) ×3 IMPLANT
STAPLER VISISTAT 35W (STAPLE) IMPLANT
SUT ETHILON 2 0 FS 18 (SUTURE) ×9 IMPLANT
SUT PROLENE 5 0 C1 (SUTURE) ×6 IMPLANT
SUT PROLENE 6 0 BV (SUTURE) ×3 IMPLANT
SUT PROLENE 6 0 CC (SUTURE) ×6 IMPLANT
SUT SILK 4 0 TIE 10X30 (SUTURE) ×3 IMPLANT
SUT VIC AB 2-0 SH 27 (SUTURE) ×3
SUT VIC AB 2-0 SH 27X BRD (SUTURE) ×4 IMPLANT
SUT VIC AB 2-0 SH 27XBRD (SUTURE) ×2 IMPLANT
SUT VIC AB 3-0 SH 27 (SUTURE) ×2
SUT VIC AB 3-0 SH 27X BRD (SUTURE) ×4 IMPLANT
TOWEL OR 17X24 6PK STRL BLUE (TOWEL DISPOSABLE) ×3 IMPLANT
TOWEL OR 17X26 10 PK STRL BLUE (TOWEL DISPOSABLE) ×3 IMPLANT
UNDERPAD 30X30 INCONTINENT (UNDERPADS AND DIAPERS) ×3 IMPLANT
WATER STERILE IRR 1000ML POUR (IV SOLUTION) IMPLANT

## 2014-05-31 NOTE — H&P (Signed)
  Vascular Surgery Consultation  Reason for Consult: Job-related injury right upper extremity with possible arterial component  HPI: Shane Saunders is a 43 y.o. male who presents for evaluation of job-related injury to right upper extremity. The patient was brought to the emergency department with an open wound in the very proximal anterior right forearm. Initially he was noted to have absent arterial flow distally in the right upper extremity at the radial and ulnar level. He did not have a history of brisk arterial bleeding from the entry site.   No past medical history on file. History reviewed. No pertinent past surgical history. History   Social History  . Marital Status: Married    Spouse Name: N/A    Number of Children: N/A  . Years of Education: N/A   Social History Main Topics  . Smoking status: Never Smoker   . Smokeless tobacco: Current User    Types: Snuff  . Alcohol Use: Yes     Comment: social  . Drug Use: None  . Sexual Activity: None   Other Topics Concern  . None   Social History Narrative  . None   No family history on file. No Known Allergies Prior to Admission medications   Medication Sig Start Date End Date Taking? Authorizing Provider  ALPRAZolam (XANAX XR) 1 MG 24 hr tablet Take 1 mg by mouth daily.   Yes Historical Provider, MD     Positive ROS:   All other systems have been reviewed and were otherwise negative with the exception of those mentioned in the HPI and as above.  Physical Exam: Filed Vitals:   05/31/14 1242  BP: 118/70  Pulse: 78  Temp: 98.7 F (37.1 C)  Resp: 20    General: Alert, no acute distress HEENT: Normal for age Cardiovascular: Regular rate and rhythm. Carotid pulses 2+, no bruits audible Respiratory: Clear to auscultation. No cyanosis, no use of accessory musculature GI: No organomegaly, abdomen is soft and non-tender Skin: No lesions in the area of chief complaint Neurologic: Sensation intact  distally Psychiatric: Patient is competent for consent with normal mood and affect Musculoskeletal: No obvious deformities Extremities:  right upper extremity with open wound transversely approximately 8-10 cm in length just distal to the antecubital space with abundant amount of flexor muscle exposed. Superficial veins transected in the antecubital area that were not bleeding. Patient had good nerve function distally in the right upper extremity with extension and flexed her abilities of the right hand. I was able to hear brisk biphasic flow in the radial artery distally but no ulnar artery flow.   Labs reviewed:   Imaging reviewed:    Assessment/Plan:   open job-related injury to right proximal forearm overlying brachial artery. Discussed with Dr. Lindie Spruce and will explore this with him in the operating room to rule out arterial injury. CT angiogram of the right upper extremity was performed and did not have great detail in this area but no obvious injury was identified    Josephina Gip, MD 05/31/2014 2:32 PM

## 2014-05-31 NOTE — ED Notes (Signed)
Report from Tina, NT 

## 2014-05-31 NOTE — ED Notes (Signed)
Pt returned to exam room from CT scan. 8/10 R arm pain at the time. Vital signs stable. Pt remains alert and oriented x4.

## 2014-05-31 NOTE — Op Note (Signed)
OPERATIVE REPORT  Date of Surgery: 05/31/2014  Surgeon: Josephina Gip, MD Dr. Megan Mans  Assistant:   Pre-op Diagnosis: Right Arm Trauma-rule out brachial artery injury  Post-op Diagnosis: Same  Procedure: Procedure(s): Exploration right brachial radial and ulnar arteries-no injury identified  Anesthesia: Gen. endotracheal  EBL: Minimal  Complications: None  Procedure Details: Patient stated the operating room placed in supine position and satisfactory general endotracheal anesthesia was administered. After prepping the right upper extremity and draping a right routine sterile manner the large transverse-oblique injury wound was explored. This was on the anterior aspect of the proximal forearm extended up to the antecubital area. Lexer muscles were present in the wound and the neurovascular bundle where the brachial artery bifurcated was readily visible. Radial and ulnar branches were dissected free at their origin the brachial was exposed about another 2-3 cm proximally. There was no injury evident. There is no evidence of any intramural hematoma or subadventitial hematoma. There was excellent Doppler flow in the deep branch which turned out to be the radial branch. There is also excellent triphasic radial arterial flow distally. Initially there was no flow in the interosseous or ulnar branch. It was mobilized by ligating a few branches with 4-0 silk ties and dividing them and then there began increasingly loud and almost normal-appearing flow by Doppler. This improved with time. It was decided not to open this artery because of the audible flow in the brisk radial artery flow distally. Dr. Lindie Spruce  then proceeded to irrigate and debride the wound and close as indicated.   Josephina Gip, MD 05/31/2014 2:38 PM

## 2014-05-31 NOTE — ED Notes (Signed)
Wallet, reading glasses and clothing given to wife at bedside.

## 2014-05-31 NOTE — H&P (Signed)
Right brachal artery likely in spasm initially from tourniquet and initial injuury.  Has good right radial Doppler signal.  Will take to the OR for wound debridement and closure.  Marta Lamas. Gae Bon, MD, FACS [phone] Trauma Surgeon

## 2014-05-31 NOTE — Anesthesia Postprocedure Evaluation (Signed)
  Anesthesia Post-op Note  Patient: Shane Saunders  Procedure(s) Performed: Procedure(s): IRRIGATION, EXPLORATION, AND REPAIR OF RIGHT ARM WOUND (Right)  Patient Location: PACU  Anesthesia Type:General and Regional  Level of Consciousness: awake and alert   Airway and Oxygen Therapy: Patient Spontanous Breathing  Post-op Pain: none  Post-op Assessment: Post-op Vital signs reviewed, Patient's Cardiovascular Status Stable, Respiratory Function Stable, Patent Airway, No signs of Nausea or vomiting and Pain level controlled  Post-op Vital Signs: Reviewed and stable  Last Vitals:  Filed Vitals:   05/31/14 1654  BP: 107/61  Pulse: 69  Temp: 36.3 C  Resp: 20    Complications: No apparent anesthesia complications

## 2014-05-31 NOTE — ED Notes (Signed)
(  3) wounds noted to R forearm. Large wound noted to RAC region, (2) smaller wounds located down forearm near hand. Unable to palpate pulses at the time. Pt able to wiggle digits, sensation intact at present. Dr. Hart Rochester at bedside.

## 2014-05-31 NOTE — ED Notes (Signed)
Pt transported to unit on cardiac monitor. Upon arrival to unit, R arm wound bleeding through dressing, new pressure dressing applied. Pt tolerated without difficulty.

## 2014-05-31 NOTE — Anesthesia Procedure Notes (Addendum)
Anesthesia Regional Block:  Supraclavicular block  Pre-Anesthetic Checklist: ,, timeout performed, Correct Patient, Correct Site, Correct Laterality, Correct Procedure, Correct Position, site marked, Risks and benefits discussed,  Surgical consent,  Pre-op evaluation,  At surgeon's request and post-op pain management  Laterality: Upper and Right  Prep: chloraprep       Needles:  Injection technique: Single-shot  Needle Type: Echogenic Stimulator Needle          Additional Needles:  Procedures: ultrasound guided (picture in chart) Supraclavicular block Narrative:  Start time: 05/31/2014 2:26 PM End time: 05/31/2014 2:31 PM Injection made incrementally with aspirations every 5 mL.  Performed by: Personally  Anesthesiologist: Shakima Nisley  Additional Notes: H+P and labs reviewed, risks and benefits discussed with patient, procedure tolerated well without complications

## 2014-05-31 NOTE — ED Notes (Signed)
EDP and trauma surgeon at bedside attempting to doppler pulses to R arm and hand. Unable to doppler pulses at the time. Movement intact at present.

## 2014-05-31 NOTE — H&P (Signed)
Shane Saunders is an 43 y.o. male.   Chief Complaint: Penetrating/crush injury to RUE HPI: Shane Saunders had his right forearm pinned in an industrial machine where two ends of 1" pipe pinned his right forearm. There was significant blood loss at the scene. They held pressure and fashioned a tourniquet and drove him to the fire station. There he had a real tourniquet applied and was brought in as a level 1 trauma secondary to hypotension. On arrival he was normotensive and hemostatic when the tourniquet was let down. Initially he had no pulses though they eventually returned.   No past medical history on file.  History reviewed. No pertinent past surgical history.  No family history on file. Social History:  reports that he has never smoked. His smokeless tobacco use includes Snuff. He reports that he drinks alcohol. His drug history is not on file.  Allergies: No Known Allergies   Results for orders placed during the hospital encounter of 05/31/14 (from the past 48 hour(s))  TYPE AND SCREEN     Status: None   Collection Time    05/31/14 10:31 AM      Result Value Ref Range   ABO/RH(D) A POS     Antibody Screen PENDING     Sample Expiration 06/03/2014     Unit Number [id number]     Blood Component Type RBC LR PHER1     Unit division 00     Status of Unit ISSUED     Unit tag comment VERBAL ORDERS PER DR Gwendolyn Grant     Transfusion Status OK TO TRANSFUSE     Crossmatch Result PENDING     Unit Number [id number]     Blood Component Type RED CELLS,LR     Unit division 00     Status of Unit ISSUED     Unit tag comment VERBAL ORDERS PER DR Gwendolyn Grant     Transfusion Status OK TO TRANSFUSE     Crossmatch Result PENDING     Unit Number [id number]     Blood Component Type RED CELLS,LR     Unit division 00     Status of Unit ISSUED     Unit tag comment VERBAL ORDERS PER DR WYATT     Transfusion Status OK TO TRANSFUSE     Crossmatch Result PENDING     Unit Number [id number]     Blood  Component Type RED CELLS,LR     Unit division 00     Status of Unit ISSUED     Unit tag comment VERBAL ORDERS PER DR WYATT     Transfusion Status OK TO TRANSFUSE     Crossmatch Result PENDING     Unit Number [id number]     Blood Component Type RED CELLS,LR     Unit division 00     Status of Unit ISSUED     Unit tag comment VERBAL ORDERS PER DR WYATT     Transfusion Status OK TO TRANSFUSE     Crossmatch Result PENDING     Unit Number [id number]     Blood Component Type RED CELLS,LR     Unit division 00     Status of Unit ISSUED     Unit tag comment VERBAL ORDERS PER DR WYATT     Transfusion Status OK TO TRANSFUSE     Crossmatch Result PENDING    PREPARE FRESH FROZEN PLASMA     Status: None   Collection Time    05/31/14 10:31 AM  Result Value Ref Range   Unit Number [id number]     Blood Component Type THAWED PLASMA     Unit division 00     Status of Unit ISSUED     Unit tag comment VERBAL ORDERS PER DR [facility]     Transfusion Status OK TO TRANSFUSE     Unit Number [id number]     Blood Component Type THAWED PLASMA     Unit division 00     Status of Unit ISSUED     Unit tag comment VERBAL ORDERS PER DR Gwendolyn Grant     Transfusion Status OK TO TRANSFUSE    ABO/RH     Status: None   Collection Time    05/31/14 10:40 AM      Result Value Ref Range   ABO/RH(D) A POS    PROTIME-INR     Status: None   Collection Time    05/31/14 10:43 AM      Result Value Ref Range   Prothrombin Time 13.3  11.6 - 15.2 seconds   INR 1.03  0.00 - 1.49  I-STAT CHEM 8, ED     Status: Abnormal   Collection Time    05/31/14 10:50 AM      Result Value Ref Range   Sodium 143  137 - 147 mEq/L   Potassium 3.7  3.7 - 5.3 mEq/L   Chloride 99  96 - 112 mEq/L   BUN 13  6 - 23 mg/dL   Creatinine, Ser 3.08  0.50 - 1.35 mg/dL   Glucose, Bld 657 (*) 70 - 99 mg/dL   Calcium, Ion 8.46  9.62 - 1.23 mmol/L   TCO2 25  0 - 100 mmol/L   Hemoglobin 15.0  13.0 - 17.0 g/dL   HCT 95.2  84.1 - 32.4 %    Dg Forearm Right  05/31/2014   CLINICAL DATA:  Trauma, open wound to right forearm  EXAM: RIGHT FOREARM - 1 VIEW  COMPARISON:  None.  FINDINGS: No fracture or dislocation is seen.  Soft tissue swelling/laceration along the ulnar aspect of the mid forearm.  No radiopaque foreign body is seen.  IMPRESSION: Soft tissue swelling/laceration along the ulnar aspect of the mid forearm.  No fracture, dislocation, or radiopaque foreign body is seen.   Electronically Signed   By: Charline Bills M.D.   On: 05/31/2014 10:59   Dg Chest Portable 1 View  05/31/2014   CLINICAL DATA:  Chest pain post trauma  EXAM: PORTABLE CHEST - 1 VIEW  COMPARISON:  None.  FINDINGS: Lungs are clear. Heart is upper normal in size with normal pulmonary vascularity. No pneumothorax. No bone lesions. No adenopathy.  IMPRESSION: No edema or consolidation.   Electronically Signed   By: Bretta Bang M.D.   On: 05/31/2014 11:14    Review of Systems  Constitutional: Negative for weight loss.  HENT: Negative for ear discharge, ear pain, hearing loss and tinnitus.   Eyes: Negative for blurred vision, double vision, photophobia and pain.  Respiratory: Negative for cough, sputum production and shortness of breath.   Cardiovascular: Negative for chest pain.  Gastrointestinal: Negative for nausea, vomiting and abdominal pain.  Genitourinary: Negative for dysuria, urgency, frequency and flank pain.  Musculoskeletal: Positive for joint pain and myalgias. Negative for back pain, falls and neck pain.  Neurological: Positive for sensory change and focal weakness. Negative for dizziness, tingling, loss of consciousness and headaches.  Endo/Heme/Allergies: Does not bruise/bleed easily.  Psychiatric/Behavioral: Negative for depression, memory loss and  substance abuse. The patient is not nervous/anxious.     Blood pressure 125/80, pulse 76, temperature 97.7 F (36.5 C), temperature source Oral, resp. rate 18, SpO2 93.00%. Physical Exam   Vitals reviewed. Constitutional: He is oriented to person, place, and time. He appears well-developed and well-nourished. He is cooperative. No distress. Nasal cannula in place.  HENT:  Head: Normocephalic and atraumatic. Head is without raccoon's eyes, without Battle's sign, without abrasion, without contusion and without laceration.  Right Ear: Hearing, tympanic membrane, external ear and ear canal normal. No lacerations. No drainage or tenderness. No foreign bodies. Tympanic membrane is not perforated. No hemotympanum.  Left Ear: Hearing, tympanic membrane, external ear and ear canal normal. No lacerations. No drainage or tenderness. No foreign bodies. Tympanic membrane is not perforated. No hemotympanum.  Nose: Nose normal. No nose lacerations, sinus tenderness, nasal deformity or nasal septal hematoma. No epistaxis.  Mouth/Throat: Uvula is midline, oropharynx is clear and moist and mucous membranes are normal. No lacerations. No oropharyngeal exudate.  Eyes: Conjunctivae, EOM and lids are normal. Pupils are equal, round, and reactive to light. Right eye exhibits no discharge. Left eye exhibits no discharge. No scleral icterus.  Neck: Trachea normal and normal range of motion. Neck supple. No JVD present. No spinous process tenderness and no muscular tenderness present. Carotid bruit is not present. No tracheal deviation present. No thyromegaly present.  Cardiovascular: Normal rate, regular rhythm, normal heart sounds, intact distal pulses and normal pulses.  Exam reveals no gallop and no friction rub.   No murmur heard. Respiratory: Effort normal and breath sounds normal. No stridor. No respiratory distress. He has no wheezes. He has no rales. He exhibits no tenderness, no bony tenderness, no laceration and no crepitus.  GI: Soft. Normal appearance. He exhibits no distension. Bowel sounds are decreased. There is no tenderness. There is no rigidity, no rebound, no guarding and no CVA tenderness.   Genitourinary: Penis normal.  Musculoskeletal: Normal range of motion. He exhibits no edema.       Right forearm: He exhibits tenderness, swelling and laceration.  Lymphadenopathy:    He has no cervical adenopathy.  Neurological: He is alert and oriented to person, place, and time. He has normal strength. No cranial nerve deficit or sensory deficit. GCS eye subscore is 4. GCS verbal subscore is 5. GCS motor subscore is 6.  Skin: Skin is warm, dry and intact. He is not diaphoretic.  Psychiatric: He has a normal mood and affect. His speech is normal and behavior is normal.     Assessment/Plan Industrial accident Right forearm injury -- To OR for I&D, exploration Hyperlipidemia Depression    Freeman CaldronMichael J. Alfard Cochrane, PA-C Pager: [phone] General Trauma PA Pager: [phone] 05/31/2014, 11:23 AM

## 2014-05-31 NOTE — Transfer of Care (Signed)
Immediate Anesthesia Transfer of Care Note  Patient: Shane Saunders  Procedure(s) Performed: Procedure(s): IRRIGATION, EXPLORATION, AND REPAIR OF RIGHT ARM WOUND (Right)  Patient Location: PACU  Anesthesia Type:General  Level of Consciousness: awake, alert  and oriented  Airway & Oxygen Therapy: Patient Spontanous Breathing and Patient connected to face mask oxygen  Post-op Assessment: Report given to PACU RN, Post -op Vital signs reviewed and stable and Patient moving all extremities X 4  Post vital signs: Reviewed and stable  Complications: No apparent anesthesia complications

## 2014-05-31 NOTE — ED Notes (Addendum)
Pt presents to department for evaluation of R arm trauma. Pt states he was using pipe cutting machine and pipe accidentally cut R arm. EMS reports hypotension, nausea and SOB on scene. Upon arrival pt is alert and oriented x4. 18g LAC. Tourniquet applied to R arm by EMS.

## 2014-05-31 NOTE — ED Provider Notes (Signed)
CSN: [id number]     Arrival date & time 05/31/14  1031 History   First MD Initiated Contact with Patient 05/31/14 1101     Chief Complaint  Patient presents with  . Trauma     (Consider location/radiation/quality/duration/timing/severity/associated sxs/prior Treatment) Patient is a 43 y.o. male presenting with trauma. The history is provided by the patient.  Trauma Mechanism of injury: Machine accident at work Injury location: shoulder/arm Injury location detail: R elbow Incident location: at work Arrived directly from scene: yes   Protective equipment:       None      Suspicion of alcohol use: no      Suspicion of drug use: no  EMS/PTA data:      Ambulatory at scene: no      Blood loss: large      Oriented to: person, place and situation      Loss of consciousness: no      Amnesic to event: no  Current symptoms:      Associated symptoms:            Denies loss of consciousness.    No past medical history on file. No past surgical history on file. No family history on file. History  Substance Use Topics  . Smoking status: Not on file  . Smokeless tobacco: Not on file  . Alcohol Use: Not on file    Review of Systems  Constitutional: Negative for fever.  Respiratory: Negative for cough and shortness of breath.   Neurological: Negative for loss of consciousness.  All other systems reviewed and are negative.     Allergies  Review of patient's allergies indicates not on file.  Home Medications   Prior to Admission medications   Not on File   BP 117/84  Pulse 75  Temp(Src) 97.7 F (36.5 C) (Oral)  Resp 18  SpO2 95% Physical Exam  Nursing note and vitals reviewed. Constitutional: He is oriented to person, place, and time. He appears well-developed and well-nourished. No distress.  HENT:  Head: Normocephalic and atraumatic.  Mouth/Throat: No oropharyngeal exudate.  Eyes: EOM are normal. Pupils are equal, round, and reactive to light.  Neck: Normal  range of motion. Neck supple.  Cardiovascular: Normal rate and regular rhythm.  Exam reveals no friction rub.   No murmur heard. Pulmonary/Chest: Effort normal and breath sounds normal. No respiratory distress. He has no wheezes. He has no rales.  Abdominal: He exhibits no distension. There is no tenderness. There is no rebound.  Musculoskeletal: He exhibits no edema.       Right elbow: He exhibits decreased range of motion and laceration. He exhibits no swelling, no effusion and no deformity.       Arms: No pulses in R arm - no Dopplerable pulse radial or ulnar.  Neurological: He is alert and oriented to person, place, and time.  Skin: He is not diaphoretic.    ED Course  Procedures (including critical care time) Labs Review Labs Reviewed  I-STAT CHEM 8, ED - Abnormal; Notable for the following:    Glucose, Bld 135 (*)    All other components within normal limits  COMPREHENSIVE METABOLIC PANEL  CBC  ETHANOL  PROTIME-INR  TYPE AND SCREEN  PREPARE FRESH FROZEN PLASMA  SAMPLE TO BLOOD BANK    Imaging Review Dg Forearm Right  05/31/2014   CLINICAL DATA:  Trauma, open wound to right forearm  EXAM: RIGHT FOREARM - 1 VIEW  COMPARISON:  None.  FINDINGS: No fracture  or dislocation is seen.  Soft tissue swelling/laceration along the ulnar aspect of the mid forearm.  No radiopaque foreign body is seen.  IMPRESSION: Soft tissue swelling/laceration along the ulnar aspect of the mid forearm.  No fracture, dislocation, or radiopaque foreign body is seen.   Electronically Signed   By: Charline BillsSriyesh  Krishnan M.D.   On: 05/31/2014 10:59     EKG Interpretation None     CRITICAL CARE Performed by: Dagmar HaitWilliam Kimi Kroft   Total critical care time: 30 minutes  Critical care time was exclusive of separately billable procedures and treating other patients.  Critical care was necessary to treat or prevent imminent or life-threatening deterioration.  Critical care was time spent personally by me on  the following activities: development of treatment plan with patient and/or surrogate as well as nursing, discussions with consultants, evaluation of patient's response to treatment, examination of patient, obtaining history from patient or surrogate, ordering and performing treatments and interventions, ordering and review of laboratory studies, ordering and review of radiographic studies, pulse oximetry and re-evaluation of patient's condition.  MDM   Final diagnoses:  Injury of right lower arm    58M here s/p machine accident. R arm squeezed between a large metal valve and a machine with large tissue avulsion to R AC fossa. No pulse in arm initially per EMS. Patient stated large amount of bleeding at the scene. Tourniquet applied by EMS at 10:12 am with control of bleeding. On arrival, airway intact, lungs clear. Abdomen benign. LUE and bilateral lower extremities without injury. R AC fossa with large avulsion injury. Muscles and tendons exposed. Tourniquet let down, no pulsatile bleeding. No pulses appreciated initially. Patient's R hand regained feeling, color, function, however he still did not have a pulse. Hand Surgeon on call consulted, but he was unavailable in the OR. Trauma surgery spoke with Dr. Hart RochesterLawson of Vascular Surgery, who evaluated the patient at bedside. Dr. Hart RochesterLawson was able to find a pulse with the Doppler. Likely arterial spasm with the injury. Will CT Angio his arm and prepare for OR with antibiotics, tetanus.    Dagmar HaitWilliam Charlye Spare, MD 05/31/14 [phone]

## 2014-05-31 NOTE — Progress Notes (Signed)
Received patient from ED, oR calling now to transport patient to OR. Patient is alert andf oriented, R arm elevated with pillow, dressing changed to R arm.

## 2014-05-31 NOTE — Op Note (Addendum)
OPERATIVE REPORT  DATE OF OPERATION: 05/31/2014  PATIENT:  Shane Saunders  43 y.o. male  PRE-OPERATIVE DIAGNOSIS:  Right Arm Trauma  POST-OPERATIVE DIAGNOSIS:  Right Arm Trauma with open wounds of the forearm  PROCEDURE:  Procedure(s): IRRIGATION, EXPLORATION, AND REPAIR OF RIGHT ARM WOUNDS, 12 cm complex Laceration, V-shaped in antecubital fossa, requiring 3-layer complex repair  SURGEON:  Surgeon(s): Pryor Ochoa, MD Cherylynn Ridges, MD  ASSISTANT: [place], NP-C  ANESTHESIA:   regional and general  EBL: <30 ml  BLOOD ADMINISTERED: none  DRAINS: Penrose drain in the wound   SPECIMEN:  No Specimen  COUNTS CORRECT:  YES  PROCEDURE DETAILS: The patient was taken to the operating room urgently because of a severe injury to his right arm from an industrial accident.  He was placed on the table in supine position with his right arm out on a [employer]. After an adequate general endotracheal anesthetic was administered he was also given a regional block of the right arm then prepped and draped in usual sterile manner with a tourniquet to be used if necessary placed on the right upper extremity.  A proper timeout was performed identifying the patient and the procedure to be performed. Dr. Hart Rochester assisted with the procedure in order to get an accurate assessment of the vasculature of the right upper extremity.  After Dr. Hart Rochester had demonstrated that the blood supply was good down to the hand and then he had Doppler signals going down to the hand, I proceeded to cleanse the wounds with a jet lavage device. There was no foreign body contamination of the wound. After irrigation, a couple of small subcutaneous veins were ligated and removed. We then closed in several layers. The brachial radialis muscle was torn and an attempt to reapproximate the muscle was made with some deep 2-0 Vicryl sutures; however, there was not much to so to as far as tendon was concerned. Close deep layer with  2-0 Vicryl and then the more superficial subcutaneous layer with 3-0 Vicryl. The skin was closed using interrupted horizontal mattress sutures of 2-0 nylon along with a couple simple sutures of 2-0 nylon. A Penrose drain was brought out the lateral inferior aspect of the wound. This is actually brought out a separate stab wound and secured in place with a 2-0 nylon suture. Once the wound is closed was covered with a Betadine solution and sterile dressing. A splint was placed in order to keep patients on the flexion at approximately 90. All needle counts, sponge counts, and instrument counts were correct.  PATIENT DISPOSITION:  PACU - hemodynamically stable.   Cherylynn Ridges 6/3/20155:47 PM

## 2014-05-31 NOTE — Progress Notes (Signed)
Responded to p;age to provide emotional and spiritual support to patient that reported to ED as a level 1.  Pt was at work when he experienced a industrial pole going through his arm. Pt. arrived in ED with no family.  Wife and other  family members arrivedand was escorted to talk with doctor then to consultation room B.  Pt.iIs going to 6N07.  Escorted family to waiting area .  Provided ministry of presence, emotional and spiritual support, promoted information sharing, and  Hospitality.  Will pass on to unit Chaplain for continued support.  05/31/14 1100  Clinical Encounter Type  Visited With Patient;Family;Patient and family together;Health care provider  Mathis Dad, Chaplain,pager [phone]

## 2014-05-31 NOTE — Anesthesia Preprocedure Evaluation (Addendum)
Anesthesia Evaluation  Patient identified by MRN, date of birth, ID band Patient awake    Reviewed: H&P , NPO status , Patient's Chart, lab work & pertinent test results  Airway Mallampati: II TM Distance: >3 FB Neck ROM: Full    Dental  (+) Chipped, Teeth Intact   Pulmonary sleep apnea and Continuous Positive Airway Pressure Ventilation , former smoker,  Cpap setting = 14 breath sounds clear to auscultation        Cardiovascular Exercise Tolerance: Good METS: 3 - Mets Rhythm:Regular Rate:Normal     Neuro/Psych PSYCHIATRIC DISORDERS Anxiety Depression negative neurological ROS     GI/Hepatic negative GI ROS, Neg liver ROS,   Endo/Other  negative endocrine ROS  Renal/GU negative Renal ROS  negative genitourinary   Musculoskeletal negative musculoskeletal ROS (+)   Abdominal (+) + obese,   Peds negative pediatric ROS (+)  Hematology negative hematology ROS (+)   Anesthesia Other Findings   Reproductive/Obstetrics negative OB ROS                         Anesthesia Physical Anesthesia Plan  ASA: II  Anesthesia Plan: General and Regional   Post-op Pain Management:    Induction: Intravenous  Airway Management Planned: Oral ETT  Additional Equipment:   Intra-op Plan:   Post-operative Plan: Extubation in OR  Informed Consent:   Dental advisory given  Plan Discussed with: CRNA, Anesthesiologist and Surgeon  Anesthesia Plan Comments:        Anesthesia Quick Evaluation

## 2014-06-01 ENCOUNTER — Encounter (HOSPITAL_COMMUNITY): Payer: Self-pay | Admitting: *Deleted

## 2014-06-01 ENCOUNTER — Other Ambulatory Visit (HOSPITAL_COMMUNITY): Payer: Self-pay | Admitting: *Deleted

## 2014-06-01 DIAGNOSIS — D62 Acute posthemorrhagic anemia: Secondary | ICD-10-CM

## 2014-06-01 LAB — TYPE AND SCREEN
ABO/RH(D): A POS
Antibody Screen: NEGATIVE
Unit division: 0
Unit division: 0
Unit division: 0
Unit division: 0
Unit division: 0
Unit division: 0

## 2014-06-01 MED ORDER — ACETAMINOPHEN 325 MG PO TABS
650.0000 mg | ORAL_TABLET | ORAL | Status: DC | PRN
  Administered 2014-06-01: 650 mg via ORAL
  Filled 2014-06-01: qty 2

## 2014-06-01 MED ORDER — ENOXAPARIN SODIUM 30 MG/0.3ML ~~LOC~~ SOLN
30.0000 mg | Freq: Two times a day (BID) | SUBCUTANEOUS | Status: DC
  Administered 2014-06-01 – 2014-06-04 (×7): 30 mg via SUBCUTANEOUS
  Filled 2014-06-01 (×9): qty 0.3

## 2014-06-01 MED ORDER — HYDROMORPHONE HCL PF 1 MG/ML IJ SOLN
INTRAMUSCULAR | Status: AC
  Filled 2014-06-01: qty 1

## 2014-06-01 MED ORDER — HYDROMORPHONE HCL PF 1 MG/ML IJ SOLN
0.5000 mg | INTRAMUSCULAR | Status: DC | PRN
  Administered 2014-06-01: 0.5 mg via INTRAVENOUS
  Administered 2014-06-01 – 2014-06-03 (×5): 1 mg via INTRAVENOUS
  Filled 2014-06-01 (×5): qty 1

## 2014-06-01 MED ORDER — OXYCODONE HCL 5 MG PO TABS
10.0000 mg | ORAL_TABLET | ORAL | Status: DC | PRN
  Administered 2014-06-01: 10 mg via ORAL
  Filled 2014-06-01: qty 2

## 2014-06-01 MED ORDER — HYDROMORPHONE HCL PF 1 MG/ML IJ SOLN
0.5000 mg | INTRAMUSCULAR | Status: DC | PRN
  Administered 2014-06-01 (×2): 0.5 mg via INTRAVENOUS
  Filled 2014-06-01 (×2): qty 1

## 2014-06-01 MED ORDER — OXYCODONE HCL 5 MG PO TABS
5.0000 mg | ORAL_TABLET | ORAL | Status: DC | PRN
  Administered 2014-06-01: 15 mg via ORAL
  Filled 2014-06-01: qty 3

## 2014-06-01 NOTE — Progress Notes (Signed)
Pt placed on CPAP via nasal mask with 2 LPM O2 bleed in.  Current settings are 14 CMH2O, pt tolerating well at this time.  RT to monitor and assess as needed.

## 2014-06-01 NOTE — Progress Notes (Signed)
Patient ID: Shane Saunders, male   DOB: November 26, 1971, 43 y.o.   MRN: [id number]   LOS: 1 day   Subjective: No unexpected c/o.   Objective: Vital signs in last 24 hours: Temp:  [97.4 F (36.3 C)-98.7 F (37.1 C)] 98.7 F (37.1 C) (06/04 0558) Pulse Rate:  [65-93] 67 (06/04 0558) Resp:  [12-20] 18 (06/04 0558) BP: (100-130)/(60-84) 115/66 mmHg (06/04 0558) SpO2:  [93 %-100 %] 94 % (06/04 0558) Weight:  [250 lb (113.399 kg)] 250 lb (113.399 kg) (06/03 1243) Last BM Date: 05/31/14   Laboratory  CBC  Recent Labs  05/31/14 1043 05/31/14 1050 06/01/14 0453  WBC 9.7  --  7.3  HGB 14.9 15.0 11.4*  HCT 41.8 44.0 32.3*  PLT PLATELET CLUMPS NOTED ON SMEAR, UNABLE TO ESTIMATE  --  PLATELET CLUMPS NOTED ON SMEAR, UNABLE TO ESTIMATE    Physical Exam General appearance: alert and no distress Resp: clear to auscultation bilaterally Cardio: regular rate and rhythm GI: normal findings: bowel sounds normal and soft, non-tender Extremities: NVI, see picture     Assessment/Plan: Right forearm injury s/p repair ABL anemia -- Mild FEN -- Orals for pain, SL IV VTE -- SCD's, start Lovenox Dispo -- OT consult    Freeman Caldron, PA-C Pager: [phone] General Trauma PA Pager: [phone]  06/01/2014

## 2014-06-01 NOTE — Progress Notes (Signed)
Forearm soft, moving fingers, having a lot of pain. Will adjust meds. Patient examined and I agree with the assessment and plan  Violeta Gelinas, MD, MPH, FACS Trauma: [phone] General Surgery: [phone]  06/01/2014 2:44 PM

## 2014-06-02 ENCOUNTER — Encounter (HOSPITAL_COMMUNITY): Payer: Self-pay | Admitting: General Surgery

## 2014-06-02 DIAGNOSIS — G4730 Sleep apnea, unspecified: Secondary | ICD-10-CM | POA: Diagnosis present

## 2014-06-02 DIAGNOSIS — F3289 Other specified depressive episodes: Secondary | ICD-10-CM | POA: Diagnosis present

## 2014-06-02 DIAGNOSIS — E669 Obesity, unspecified: Secondary | ICD-10-CM | POA: Diagnosis present

## 2014-06-02 DIAGNOSIS — W230XXA Caught, crushed, jammed, or pinched between moving objects, initial encounter: Secondary | ICD-10-CM | POA: Diagnosis present

## 2014-06-02 DIAGNOSIS — E785 Hyperlipidemia, unspecified: Secondary | ICD-10-CM | POA: Diagnosis present

## 2014-06-02 DIAGNOSIS — Y9269 Other specified industrial and construction area as the place of occurrence of the external cause: Secondary | ICD-10-CM | POA: Diagnosis not present

## 2014-06-02 DIAGNOSIS — D62 Acute posthemorrhagic anemia: Secondary | ICD-10-CM | POA: Diagnosis present

## 2014-06-02 DIAGNOSIS — M79609 Pain in unspecified limb: Secondary | ICD-10-CM | POA: Diagnosis present

## 2014-06-02 DIAGNOSIS — F17200 Nicotine dependence, unspecified, uncomplicated: Secondary | ICD-10-CM | POA: Diagnosis present

## 2014-06-02 DIAGNOSIS — Z6839 Body mass index (BMI) 39.0-39.9, adult: Secondary | ICD-10-CM | POA: Diagnosis not present

## 2014-06-02 DIAGNOSIS — F329 Major depressive disorder, single episode, unspecified: Secondary | ICD-10-CM | POA: Diagnosis present

## 2014-06-02 DIAGNOSIS — I959 Hypotension, unspecified: Secondary | ICD-10-CM | POA: Diagnosis present

## 2014-06-02 DIAGNOSIS — Z79899 Other long term (current) drug therapy: Secondary | ICD-10-CM | POA: Diagnosis not present

## 2014-06-02 DIAGNOSIS — F411 Generalized anxiety disorder: Secondary | ICD-10-CM | POA: Diagnosis present

## 2014-06-02 DIAGNOSIS — S51809A Unspecified open wound of unspecified forearm, initial encounter: Secondary | ICD-10-CM | POA: Diagnosis present

## 2014-06-02 LAB — CBC
HCT: 32.3 % — ABNORMAL LOW (ref 39.0–52.0)
HCT: 36.2 % — ABNORMAL LOW (ref 39.0–52.0)
Hemoglobin: 11.4 g/dL — ABNORMAL LOW (ref 13.0–17.0)
Hemoglobin: 12.4 g/dL — ABNORMAL LOW (ref 13.0–17.0)
MCH: 31.7 pg (ref 26.0–34.0)
MCH: 31.3 pg (ref 26.0–34.0)
MCHC: 35.3 g/dL (ref 30.0–36.0)
MCHC: 34.3 g/dL (ref 30.0–36.0)
MCV: 89.7 fL (ref 78.0–100.0)
MCV: 91.4 fL (ref 78.0–100.0)
Platelets: UNDETERMINED 10*3/uL (ref 150–400)
Platelets: UNDETERMINED 10*3/uL (ref 150–400)
RBC: 3.6 MIL/uL — ABNORMAL LOW (ref 4.22–5.81)
RBC: 3.96 MIL/uL — ABNORMAL LOW (ref 4.22–5.81)
RDW: 13 % (ref 11.5–15.5)
RDW: 12.8 % (ref 11.5–15.5)
WBC: 7.3 10*3/uL (ref 4.0–10.5)
WBC: 7.2 10*3/uL (ref 4.0–10.5)

## 2014-06-02 LAB — BASIC METABOLIC PANEL
BUN: 12 mg/dL (ref 6–23)
CO2: 29 mEq/L (ref 19–32)
Calcium: 8.8 mg/dL (ref 8.4–10.5)
Chloride: 99 mEq/L (ref 96–112)
Creatinine, Ser: 1.12 mg/dL (ref 0.50–1.35)
GFR calc Af Amer: >90 mL/min (ref >90)
GFR calc non Af Amer: 78 mL/min — ABNORMAL LOW (ref >90)
Glucose, Bld: 133 mg/dL — ABNORMAL HIGH (ref 70–99)
Potassium: 3.8 mEq/L (ref 3.7–5.3)
Sodium: 137 mEq/L (ref 137–147)

## 2014-06-02 LAB — CK: Total CK: 1375 U/L — ABNORMAL HIGH (ref 7–232)

## 2014-06-02 MED ORDER — BACITRACIN ZINC 500 UNIT/GM EX OINT
TOPICAL_OINTMENT | Freq: Two times a day (BID) | CUTANEOUS | Status: DC
  Administered 2014-06-02 – 2014-06-04 (×4): via TOPICAL
  Filled 2014-06-02: qty 15
  Filled 2014-06-02: qty 28.35

## 2014-06-02 MED ORDER — DOCUSATE SODIUM 100 MG PO CAPS
100.0000 mg | ORAL_CAPSULE | Freq: Two times a day (BID) | ORAL | Status: DC
  Administered 2014-06-02 – 2014-06-04 (×4): 100 mg via ORAL
  Filled 2014-06-02 (×5): qty 1

## 2014-06-02 MED ORDER — KETOROLAC TROMETHAMINE 30 MG/ML IJ SOLN
30.0000 mg | Freq: Once | INTRAMUSCULAR | Status: AC
  Administered 2014-06-02: 30 mg via INTRAVENOUS
  Filled 2014-06-02: qty 1

## 2014-06-02 MED ORDER — POLYETHYLENE GLYCOL 3350 17 G PO PACK
17.0000 g | PACK | Freq: Every day | ORAL | Status: DC
  Administered 2014-06-02 – 2014-06-04 (×3): 17 g via ORAL
  Filled 2014-06-02 (×3): qty 1

## 2014-06-02 MED ORDER — TRAMADOL HCL 50 MG PO TABS
100.0000 mg | ORAL_TABLET | Freq: Four times a day (QID) | ORAL | Status: DC
  Administered 2014-06-02 – 2014-06-04 (×6): 100 mg via ORAL
  Filled 2014-06-02 (×6): qty 2

## 2014-06-02 MED ORDER — DIPHENHYDRAMINE HCL 25 MG PO CAPS
25.0000 mg | ORAL_CAPSULE | Freq: Four times a day (QID) | ORAL | Status: DC | PRN
  Administered 2014-06-02: 25 mg via ORAL
  Filled 2014-06-02: qty 1

## 2014-06-02 MED ORDER — OXYCODONE HCL 5 MG PO TABS
10.0000 mg | ORAL_TABLET | ORAL | Status: DC | PRN
  Administered 2014-06-02 – 2014-06-03 (×3): 20 mg via ORAL
  Filled 2014-06-02 (×3): qty 4

## 2014-06-02 MED ORDER — HYDROXYZINE HCL 25 MG PO TABS
25.0000 mg | ORAL_TABLET | ORAL | Status: DC | PRN
  Administered 2014-06-02 – 2014-06-03 (×4): 25 mg via ORAL
  Filled 2014-06-02 (×3): qty 1

## 2014-06-02 MED ORDER — CITALOPRAM HYDROBROMIDE 40 MG PO TABS
40.0000 mg | ORAL_TABLET | Freq: Every day | ORAL | Status: DC
  Administered 2014-06-02 – 2014-06-04 (×3): 40 mg via ORAL
  Filled 2014-06-02 (×3): qty 1

## 2014-06-02 NOTE — Clinical Social Work Psychosocial (Signed)
Clinical Social Work Department BRIEF PSYCHOSOCIAL ASSESSMENT 06/02/2014  Patient:  Shane Saunders,Shane Saunders     Account Number:  [id number]     Admit date:  05/31/2014  Clinical Social Worker:  Cristy Folks  Date/Time:  06/02/2014 11:53 AM  Referred by:  CSW  Date Referred:  06/02/2014 Referred for  Other - See comment   Other Referral:   SBIRT   Interview type:  Patient Other interview type:   Family- [name] Pizzo-spouse    PSYCHOSOCIAL DATA Living Status:  FAMILY Admitted from facility:  N/A Level of care:  N/A Primary support name:  Wm Riviello Primary support relationship to patient: spouse   Degree of support available:   Good support    CURRENT CONCERNS  Other Concerns:  None  SOCIAL WORK ASSESSMENT / PLAN CSW consult to pt regarding SBIRT and brief psychosocial assessment. Pt was injured at work. Pt scored very low on the SBIRT, reflecting no issues surrounding substance usage. Pt alert and oriented x 4. Pt's wife and mother at bedside. Pt reported that he has been employed at the same company 26 years with no such injuries. Pt reported incident surrounding the trauma. During CSW's interview it was very obvious that pt was in pain. CSW alerted nurse. Pt/family awaiting disposition with the understanding he will be returning home. Occupational Therapy entered room and CSW ended assessment.   Assessment/plan status:  No Further Intervention Required Other assessment/ plan:   N/A   Information/referral to community resources:   N/A    PATIENT'S/FAMILY'S RESPONSE TO PLAN OF CARE: CSW role explained to pt/family. Pt/family voiced understanding. Pt/family appreciated visits from CSW. Pt/family spoke of excellent care received by pt. No further CSW interventions needed.   [address] [place] [phone]

## 2014-06-02 NOTE — Evaluation (Signed)
Occupational Therapy Evaluation Patient Details Name: Shane Saunders MRN: [id number] DOB: 1971-07-09 Today's Date: 06/02/2014    History of Present Illness IRRIGATION, EXPLORATION, AND REPAIR OF RIGHT ARM WOUNDS, 12 cm complex Laceration, V-shaped in antecubital fossa due to compression of tissue in machine   Clinical Impression   This 43 yo male admitted and underwent above presents to acute OT with education initiated on exercises of RUE and ADLs with RUE involvement. Pt was Independent pta and will benefit from continued OT one more session to make sure he is doing his exercises correctly and no more questions from him or family about BADLs.    Follow Up Recommendations  No OT follow up    Equipment Recommendations  None recommended by OT       Precautions / Restrictions Precautions Precautions:  (keep arm dry) Restrictions Weight Bearing Restrictions: No      Mobility Bed Mobility Overal bed mobility: Needs Assistance Bed Mobility: Rolling;Sidelying to Sit Rolling: Min guard Sidelying to sit: Mod assist          Transfers Overall transfer level: Needs assistance Equipment used: None Transfers: Sit to/from Stand Sit to Stand: Min guard                   ADL Overall ADL's : Needs assistance/impaired                                       General ADL Comments: Wife will A pt prn for BADLs and until he gets more use and less pain fo RUE. They are aware of RUE in first and out last when getting dressed, one size larger t-shirts than normal will be easier, and so will elastic waist LB cloting               Pertinent Vitals/Pain 6/10 RUE at rest and with activity     Hand Dominance Right   Extremity/Trunk Assessment Upper Extremity Assessment Upper Extremity Assessment: RUE deficits/detail RUE Deficits / Details: Decreased use of RUE due to pain and edema RUE Coordination: decreased fine motor;decreased gross motor            Communication Communication Communication: No difficulties   Cognition Arousal/Alertness: Awake/alert Behavior During Therapy: WFL for tasks assessed/performed Overall Cognitive Status: Within Functional Limits for tasks assessed                        Exercises   Other Exercises Other Exercises: Pt in structed to perform 5 reps of composite finger flexion/extension every hour that he is awake. Also instructed to perform 5 reps 3x/day of wrist flexion/extensionl; forearm supination/pronation; elbow flexion/extension; shoulder flexion/extension, shoudler abduction/adduction; shoulder circles clockwise and counterclockwise. With increasing reps or  times per day every 3 days (handout provided). Pt completed one set of all of these in a supine position while I was in the room with him.        Home Living Family/patient expects to be discharged to:: Private residence Living Arrangements: Spouse/significant other;Parent Available Help at Discharge: Family Type of Home: House Home Access: Stairs to enter [employer] of Steps: 1 Entrance Stairs-Rails: None Home Layout: Two level;Able to live on main level with bedroom/bathroom     Bathroom Shower/Tub: Producer, television/film/video: Standard     Home Equipment: Shower seat - built in  Prior Functioning/Environment Level of Independence: Independent             OT Diagnosis: Generalized weakness;Acute pain   OT Problem List: Decreased strength;Decreased range of motion;Pain;Impaired UE functional use   OT Treatment/Interventions: Self-care/ADL training;Patient/family education;Therapeutic exercise    OT Goals(Current goals can be found in the care plan section) Acute Rehab OT Goals Patient Stated Goal: Did not state OT Goal Formulation: With patient Time For Goal Achievement: 06/09/14 Potential to Achieve Goals: Good  OT Frequency: Min 2X/week              End of Session Equipment  Utilized During Treatment:  (None)  Activity Tolerance: Patient tolerated treatment well Patient left: in chair   Time: 1115-1204 OT Time Calculation (min): 49 min Charges:  OT General Charges $OT Visit: 1 Procedure OT Evaluation $Initial OT Evaluation Tier I: 1 Procedure OT Treatments $Self Care/Home Management : 8-22 mins $Therapeutic Exercise: 23-37 mins   Evette GeorgesCatherine Eva Emelie Newsom [phone] 06/02/2014, 4:38 PM

## 2014-06-02 NOTE — Progress Notes (Signed)
Patient ID: Shane Saunders, male   DOB: 1971/04/25, 43 y.o.   MRN: [id number]   LOS: 2 days   Subjective: Doing ok, denies N/T. C/o soreness all over and has occasional full body tremors (witnessed by me).   Objective: Vital signs in last 24 hours: Temp:  [97.9 F (36.6 C)-99.1 F (37.3 C)] 97.9 F (36.6 C) (06/05 0639) Pulse Rate:  [71-85] 71 (06/05 0639) Resp:  [16-20] 16 (06/05 0639) BP: (103-123)/(56-72) 105/58 mmHg (06/05 0639) SpO2:  [91 %-98 %] 93 % (06/05 0639) Last BM Date: 05/31/14   Physical Exam General appearance: alert and no distress Resp: clear to auscultation bilaterally Cardio: regular rate and rhythm GI: normal findings: bowel sounds normal and soft, non-tender Extremities: RUE: Lacerations C/D/I, penrose in place. 1+ radial pulse, moderate forearm edema but not especially TTP   Assessment/Plan: Right forearm injury s/p repair -- Local care, awaiting OT consult ABL anemia -- Mild  FEN -- Add bowel regimen, add scheduled tramadol, check CBC, BMET, CK VTE -- SCD's, Lovenox  Dispo -- OT consult    Freeman Caldron, PA-C Pager: [phone] General Trauma PA Pager: [phone]  06/02/2014

## 2014-06-02 NOTE — Progress Notes (Signed)
Pain is not well controlled.  Now getting scheduled Tramadol. \ This patient has been seen and I agree with the findings and treatment plan.  Marta Lamas. Gae Bon, MD, FACS [phone] (pager) [phone] (direct pager) Trauma Surgeon

## 2014-06-02 NOTE — Progress Notes (Signed)
UR completed. Patient con't to requiring IV pain medication

## 2014-06-03 MED ORDER — HYDROXYZINE HCL 25 MG PO TABS
25.0000 mg | ORAL_TABLET | ORAL | Status: DC | PRN
  Administered 2014-06-04: 25 mg via ORAL
  Filled 2014-06-03 (×2): qty 1

## 2014-06-03 MED ORDER — OXYCODONE HCL 5 MG PO TABS
5.0000 mg | ORAL_TABLET | ORAL | Status: DC | PRN
  Administered 2014-06-04 (×2): 10 mg via ORAL
  Filled 2014-06-03 (×2): qty 2

## 2014-06-03 NOTE — Progress Notes (Signed)
Scheduled tramadol for 12p and 6p held d/t pt. Being overly sedated/hard to arouse this AM.  Pt. C/o no pain at this time.  Will continue to monitor. Vanice Sarah

## 2014-06-03 NOTE — Progress Notes (Signed)
Pt. Saturation down to 65% per NT.  Charge nurse made aware while I was assisting another pt.  CN put pt. On 6L O2 and sats up to 94%.  Pt. In no acute distress.  Pt. Drowsy but appropriate when aroused.  Pt. Now placed on CPAP d/t pt. Falling asleep.  Will continue to monitor. Shane Saunders

## 2014-06-03 NOTE — Progress Notes (Signed)
Patient ID: Shane Saunders, male   DOB: 1971-09-16, 43 y.o.   MRN: [id number] 3 Days Post-Op  Subjective: Very drowsy but can be awakened and then is appropriate and in no distress. Wife states he has been very sleepy at times. He has sleep apnea and has been using CPAP here in the hospital. Had one more episode of generalized tremors lasting a couple of minutes yesterday. He is alert and responsive during this period  Objective: Vital signs in last 24 hours: Temp:  [97.9 F (36.6 C)-99.1 F (37.3 C)] 97.9 F (36.6 C) (06/06 0515) Pulse Rate:  [64-90] 78 (06/06 0515) Resp:  [13-19] 14 (06/06 0515) BP: (105-135)/(62-78) 135/78 mmHg (06/06 0515) SpO2:  [90 %-100 %] 93 % (06/06 0515) Last BM Date: 05/31/14  Intake/Output from previous day: 06/05 0701 - 06/06 0700 In: 480 [P.O.:480] Out: 300 [Urine:300] Intake/Output this shift:    General appearance: very drowsy but arousable and then  conversant and appropriate but falls immediately back to sleep. Incision/Wound: clean with very minimal superficial necrosis at the tip of the skin flap. No erythema or drainage. Has good passive range of motion at the elbow and wrist with minimal active motion  Lab Results:   Recent Labs  06/01/14 0453 06/02/14 0900  WBC 7.3 7.2  HGB 11.4* 12.4*  HCT 32.3* 36.2*  PLT PLATELET CLUMPS NOTED ON SMEAR, UNABLE TO ESTIMATE PLATELET CLUMPS NOTED ON SMEAR, UNABLE TO ESTIMATE   BMET  Recent Labs  05/31/14 1043 05/31/14 1050 06/02/14 0900  NA 140 143 137  K 3.8 3.7 3.8  CL 105 99 99  CO2 25  --  29  GLUCOSE 134* 135* 133*  BUN 13 13 12   CREATININE 1.04 1.10 1.12  CALCIUM 9.2  --  8.8     Studies/Results: No results found.  Anti-infectives: Anti-infectives   Start     Dose/Rate Route Frequency Ordered Stop   05/31/14 1800  ceFAZolin (ANCEF) IVPB 2 g/50 mL premix  Status:  Discontinued     2 g 100 mL/hr over 30 Minutes Intravenous 3 times per day 05/31/14 1703 06/01/14 1003   05/31/14  1115  ceFAZolin (ANCEF) IVPB 2 g/50 mL premix  Status:  Discontinued     2 g 100 mL/hr over 30 Minutes Intravenous  Once 05/31/14 1101 06/01/14 1004   05/31/14 1115  ceFAZolin (ANCEF) IVPB 2 g/50 mL premix     2 g 100 mL/hr over 30 Minutes Intravenous  Once 05/31/14 1102 05/31/14 1211      Assessment/Plan: s/p Procedure(s): IRRIGATION, EXPLORATION, AND REPAIR OF RIGHT ARM WOUND Seems oversedated.  Question related to sleep apnea but is using CPAP and apparently slept okay last night. May need to reduce narcotics. Wound seems to be healing well. Needs a range of motion and is working with OT    LOS: 3 days    Mariella Saa 06/03/2014

## 2014-06-03 NOTE — Progress Notes (Signed)
Pt. Ambulated around unit once and tolerated well without oxygen.  Sats when returned to room were 85% RA and once pt. Sat down and relaxed came up to 93%.  Pt. Starting to feel drowsy again so CPAP was replaced.  Will continue to monitor. Vanice Sarah

## 2014-06-03 NOTE — Progress Notes (Signed)
Rash noted on pt.'s upper back.  MD notified and updated medication list.  Will continue to monitor. Vanice Sarah

## 2014-06-03 NOTE — Progress Notes (Signed)
Patient to self-administer CPAP.  Patient is familiar with equipment and procedure as patient is compliant with CPAP at home.  Nasal mask and 14 cmH20 used as per home regimen.

## 2014-06-04 MED ORDER — TRAMADOL HCL 50 MG PO TABS: 100.0000 mg | ORAL_TABLET | Freq: Four times a day (QID) | ORAL | Status: DC

## 2014-06-04 MED ORDER — ACETAMINOPHEN 325 MG PO TABS: 650.0000 mg | ORAL_TABLET | ORAL | Status: DC | PRN

## 2014-06-04 MED ORDER — OXYCODONE HCL 5 MG PO TABS: 5.0000 mg | ORAL_TABLET | ORAL | Status: DC | PRN

## 2014-06-04 MED ORDER — POLYETHYLENE GLYCOL 3350 17 G PO PACK: 17.0000 g | PACK | Freq: Every day | ORAL | Status: DC

## 2014-06-04 NOTE — Discharge Summary (Signed)
Physician Discharge Summary  Shane Saunders JXB:[id number] DOB: December 28, 1971 DOA: 05/31/2014  PCP: Nadean CorwinMCKEOWN,WILLIAM DAVID, MD  Consultation: Dr. Sena SlateLawson--Vascular Surgery  Admit date: 05/31/2014 Discharge date: 06/04/2014  Recommendations for Outpatient Follow-up:   Follow-up Information   Follow up with [facility] On 06/07/2014. (arrive by 3PM for a 3:30PM appt, For wound re-check, For suture removal)    Contact information:   [address] [place] [address] [phone]      Discharge Diagnoses:  1. Right forearm injury 2. ABL anemia   Surgical Procedure: IRRIGATION, EXPLORATION, AND REPAIR OF RIGHT ARM WOUNDS, 12 cm complex Laceration, V-shaped in antecubital fossa, requiring 3-layer complex repair---Dr. Lindie SpruceWyatt 05/31/14   Discharge Condition: stable Disposition: home  Diet recommendation: regular  Filed Weights   05/31/14 1243  Weight: 250 lb (113.399 kg)     Filed Vitals:   06/04/14 0921  BP: 110/59  Pulse: 59  Temp: 98 F (36.7 C)  Resp: 18     Hospital Course:  [facility] following a job related injury, 1" pipe pinned his right forearm.  He had significant blood loss at the scene and a tourniquet was applied which was released upon ED arrival without any significant blood loss.  He was normotensive.  Initially unable to palpate pulses with doppler, however, vascular surgery was able to. He was able to move all digits and had good sensation.  CTA of the arm did not reveal any arterial injury.  He had exploration of the wound and repair of the laceration in the OR, vascular surgery was present. Post operatively, he had a lot of pain, OT was consulted. His vital signs remained stable.  He was mobilized.  On POD# 4 he was felt stable for discharge home.  Penrose drain was removed prior to discharge.  We reviewed home dressing changes with his wife.  Will have him come back on Wednesday for a wound check and to  have his sutures removed.  He was encouraged to call with questions or concerns. We discussed his pain regimen at home.  Medication risks, benefits and therapeutic alternatives were reviewed with the patient.  She verbalizes understanding.    Physical Exam: General appearance: alert and oriented. Calm and cooperative No acute distress. VSS. Afebrile.  Resp: clear to auscultation bilaterally  Cardio: S1S1 RRR without murmurs or gallops. No edema. GI: soft round and nontender. +BS x4 quadrants. No organomegaly, hernias or masses.  Pulses: +2 bilateral distal pulses without cyanosis  Neurologic: Mental status: Alert, oriented, thought content appropriate  Skin: right forearm, stitches and staples in place, wound edges are approximated.  Penrose drain removed.  Dressing reapplied.   Discharge Instructions     Medication List         acetaminophen 325 MG tablet  Commonly known as:  TYLENOL  Take 2 tablets (650 mg total) by mouth every 4 (four) hours as needed for headache.     ALLERGY MEDICATION PO  Take 1 tablet by mouth daily.     alprazolam 2 MG tablet  Commonly known as:  XANAX  Take 2 mg by mouth at bedtime as needed for sleep.     atorvastatin 80 MG tablet  Commonly known as:  LIPITOR  Take 80 mg by mouth daily.     meloxicam 15 MG tablet  Commonly known as:  MOBIC  Take 7.5-15 mg by mouth daily.     oxyCODONE 5 MG immediate release  tablet  Commonly known as:  Oxy IR/ROXICODONE  Take 1-3 tablets (5-15 mg total) by mouth every 4 (four) hours as needed (5mg  for mild pain, 10mg  for moderate pain, 15mg  for severe pain).     polyethylene glycol packet  Commonly known as:  MIRALAX / GLYCOLAX  Take 17 g by mouth daily.     traMADol 50 MG tablet  Commonly known as:  ULTRAM  Take 2 tablets (100 mg total) by mouth every 6 (six) hours.     Vitamin D 2000 UNITS Caps  Take 8,000 Units by mouth daily.           Follow-up Information   Follow up with [facility]  On 06/07/2014. (arrive by 3PM for a 3:30PM appt, For wound re-check, For suture removal)    Contact information:   [address] [place] [address] [phone]        The results of significant diagnostics from this hospitalization (including imaging, microbiology, ancillary and laboratory) are listed below for reference.    Significant Diagnostic Studies: Dg Forearm Right  05/31/2014   CLINICAL DATA:  Trauma, open wound to right forearm  EXAM: RIGHT FOREARM - 1 VIEW  COMPARISON:  None.  FINDINGS: No fracture or dislocation is seen.  Soft tissue swelling/laceration along the ulnar aspect of the mid forearm.  No radiopaque foreign body is seen.  IMPRESSION: Soft tissue swelling/laceration along the ulnar aspect of the mid forearm.  No fracture, dislocation, or radiopaque foreign body is seen.   Electronically Signed   By: Charline Bills M.D.   On: 05/31/2014 10:59   Ct Angio Up Extrem Right W/cm &/or Wo/cm  05/31/2014   CLINICAL DATA:  Penetrating/crush injury to the right upper extremity. Evaluate for a vascular injury.  EXAM: CT ANGIOGRAPHY UPPER RIGHT EXTREMITY  TECHNIQUE: CT images of the right upper extremity were obtained. Incidental imaging of the chest and abdomen were also obtained.  CONTRAST:  OMNIPAQUE IOHEXOL 350 MG/ML SOLN  COMPARISON:  None.  FINDINGS: Vascular structures: The vascular structures are poorly opacified on this examination. The proximal right subclavian artery is not well visualized. The right axillary artery is patent. There is opacification of the right brachial artery. There is a bandage and soft tissue defect near the antecubital fossa which is compatible with the history. The brachial artery near the soft tissue defect appears to be patent. However, the arterial opacification beyond the brachial artery bifurcation is very poor. The arteries in the forearm cannot be evaluated. There is soft tissue gas extending along the right forearm. No  significant opacification of the wrist arteries. There is flow within the great vessels. No significant enlargement of the aorta. Flow in the celiac trunk, SMA, bilateral renal arteries and the inferior mesenteric artery. There is flow in the iliac arteries bilaterally. Flow in the proximal femoral arteries bilaterally.  Nonvascular structures: Soft tissue gas with bandages around the right upper extremity antecubital fossa and proximal forearm. Findings are consistent with recent injury and surgery. No large fluid collections are identified in the right forearm. There is no evidence for a fracture involving the right upper extremity. No acute bone abnormality in the chest or abdomen.  No significant pericardial or pleural fluid. Trachea and mainstem bronchi are patent. Patchy densities in lower lung lobes are most compatible with atelectasis. Decreased attenuation of the liver probably represents steatosis. No gross abnormality to the gallbladder, spleen, pancreas or adrenal glands. Normal appearance of both kidneys.  Normal appearance of the prostate and urinary bladder. The cecum is on the left side of the abdomen and this is probably related to the patient being positioned on the left side. However, the findings suggests a mobile cecum. No evidence for bowel dilatation or obstruction. No significant free fluid or lymphadenopathy.  Review of the MIP images confirms the above findings.  IMPRESSION: Limited evaluation of the right upper extremity arteries. The main right upper extremity arteries are patent to the antecubital fossa. The right forearm arteries are not adequately evaluated on this examination.  Postsurgical and posttraumatic changes in the right forearm soft tissues.  No acute abnormalities within the chest, abdomen or pelvis.  The patient probably has a mobile cecum. No evidence for bowel obstruction.   Electronically Signed   By: Richarda Overlie M.D.   On: 05/31/2014 12:46   Dg Chest Portable 1  View  05/31/2014   CLINICAL DATA:  Chest pain post trauma  EXAM: PORTABLE CHEST - 1 VIEW  COMPARISON:  None.  FINDINGS: Lungs are clear. Heart is upper normal in size with normal pulmonary vascularity. No pneumothorax. No bone lesions. No adenopathy.  IMPRESSION: No edema or consolidation.   Electronically Signed   By: Bretta Bang M.D.   On: 05/31/2014 11:14    Microbiology: Recent Results (from the past 240 hour(s))  MRSA PCR SCREENING     Status: None   Collection Time    05/31/14 12:59 PM      Result Value Ref Range Status   MRSA by PCR NEGATIVE  NEGATIVE Final   Comment:            The GeneXpert MRSA Assay (FDA     approved for NASAL specimens     only), is one component of a     comprehensive MRSA colonization     surveillance program. It is not     intended to diagnose MRSA     infection nor to guide or     monitor treatment for     MRSA infections.     Labs: Basic Metabolic Panel:  Recent Labs Lab 05/31/14 1043 05/31/14 1050 06/02/14 0900  NA 140 143 137  K 3.8 3.7 3.8  CL 105 99 99  CO2 25  --  29  GLUCOSE 134* 135* 133*  BUN 13 13 12   CREATININE 1.04 1.10 1.12  CALCIUM 9.2  --  8.8   Liver Function Tests:  Recent Labs Lab 05/31/14 1043  AST 23  ALT 36  ALKPHOS 72  BILITOT 0.6  PROT 6.7  ALBUMIN 4.1   No results found for this basename: LIPASE, AMYLASE,  in the last 168 hours No results found for this basename: AMMONIA,  in the last 168 hours CBC:  Recent Labs Lab 05/31/14 1043 05/31/14 1050 06/01/14 0453 06/02/14 0900  WBC 9.7  --  7.3 7.2  HGB 14.9 15.0 11.4* 12.4*  HCT 41.8 44.0 32.3* 36.2*  MCV 88.2  --  89.7 91.4  PLT PLATELET CLUMPS NOTED ON SMEAR, UNABLE TO ESTIMATE  --  PLATELET CLUMPS NOTED ON SMEAR, UNABLE TO ESTIMATE PLATELET CLUMPS NOTED ON SMEAR, UNABLE TO ESTIMATE   Cardiac Enzymes:  Recent Labs Lab 06/02/14 0900  CKTOTAL 1375*   BNP: BNP (last 3 results) No results found for this basename: PROBNP,  in the last  8760 hours CBG: No results found for this basename: GLUCAP,  in the last 168 hours  Active Problems:   Penetrating forearm wound  Acute blood loss anemia   Time coordinating discharge: <30 mins  Signed:  Malaysia Crance, ANP-BC

## 2014-06-04 NOTE — Progress Notes (Signed)
Discharge instructions and prescriptions for ultram and oxycodone were given and explained to pt. And pt.'s wife.  Both verbalized understanding of all orders/instructions and deny any questions.  Pt.'s wife taught how to change pt.'s dressing/verbalizes understanding and denies questions.  Pt. Given supplies to take home for dressing change.  IV removed.  VSS. Pt. In stable condition for discharge. Vanice Sarah

## 2014-06-04 NOTE — Discharge Instructions (Signed)
Change dressing once daily, apply the non sticky dressing, then wrap with guaze and then the ace bandage  You may shower  Keep your arm elevated to help with swelling  You may take tramadol 100mg  every 6 hours, cut this in half if you feel too sedated.  You may take oxycodone on top of this should have increased pain.  The meloxicam will also help alleviate your symptoms

## 2014-06-05 ENCOUNTER — Encounter: Payer: Self-pay | Admitting: Emergency Medicine

## 2014-06-05 NOTE — Progress Notes (Signed)
Pt with order for HHRN/OT at d/c. Unable to make that arrangement since pt is workers comp. Spoke with Henrene Pastor [facility]), his workers comp Sports coach.  She gave me a fax number.  I will fax orders for both to her to make those home arrangements.  Spoke with pt's wife on the phone about doing this and reviewed the s/s of infection with her so that she could identify any of them prior to having the assistance of a HHRN.  She stated understanding.   WC CM: Henrene Pastor Phone: [phone] Fax:  [phone]

## 2014-06-07 ENCOUNTER — Encounter (INDEPENDENT_AMBULATORY_CARE_PROVIDER_SITE_OTHER): Payer: Self-pay

## 2014-06-07 ENCOUNTER — Ambulatory Visit (INDEPENDENT_AMBULATORY_CARE_PROVIDER_SITE_OTHER): Payer: Worker's Compensation | Admitting: General Surgery

## 2014-06-07 ENCOUNTER — Telehealth (HOSPITAL_COMMUNITY): Payer: Self-pay

## 2014-06-07 VITALS — BP 114/68 | HR 76 | Temp 98.0°F | Resp 16 | Ht 70.0 in | Wt 250.4 lb

## 2014-06-07 DIAGNOSIS — S41111A Laceration without foreign body of right upper arm, initial encounter: Secondary | ICD-10-CM

## 2014-06-07 DIAGNOSIS — S41109A Unspecified open wound of unspecified upper arm, initial encounter: Secondary | ICD-10-CM

## 2014-06-07 MED ORDER — TRAMADOL HCL 50 MG PO TABS: 100.0000 mg | ORAL_TABLET | Freq: Four times a day (QID) | ORAL | Status: DC

## 2014-06-07 MED ORDER — TRIAMCINOLONE ACETONIDE 0.025 % EX OINT: 1.0000 "application " | TOPICAL_OINTMENT | Freq: Two times a day (BID) | CUTANEOUS | Status: AC

## 2014-06-07 MED ORDER — CEPHALEXIN 500 MG PO CAPS: 500.0000 mg | ORAL_CAPSULE | Freq: Four times a day (QID) | ORAL | Status: AC

## 2014-06-07 NOTE — Progress Notes (Signed)
Subjective: wound check and suture removal     Patient ID: Shane Saunders, male   DOB: 1971-02-12, 43 y.o.   MRN: [id number]  HPI Shane Saunders presented to [facility] as a level 1 trauma following a job related injury, 1" pipe pinned his right forearm. He had significant blood loss at the scene and a tourniquet was applied which was released upon ED arrival without any significant blood loss.  CTA of the arm did not reveal any arterial injury. He had exploration of the wound and repair of the laceration in the OR, vascular surgery was present.  He was discharged home on POD#4.  His wife has been doing daly dressing changes and applying bacitracin.  She has noticed some erythema and skin breakdown.  The patient denies fever, chills or sweats.  He is only taking tramadol for pain.     Review of Systems  All other systems reviewed and are negative.      Objective:   Physical Exam Filed Vitals:   06/07/14 1522  BP: 114/68  Pulse: 76  Temp: 98 F (36.7 C)  Resp: 16    General appearance: alert and oriented. Calm and cooperative No acute distress. VSS. Afebrile.  Resp: clear to auscultation bilaterally  Cardio: S1S1 RRR without murmurs or gallops. No edema.  GI: soft round and nontender. +BS x4 quadrants. No organomegaly, hernias or masses.  Pulses: +2 bilateral distal pulses without cyanosis  Neurologic: Mental status: Alert, oriented, thought content appropriate  Skin:  Inferior wound without erythema or drainage.           Assessment:     IRRIGATION, EXPLORATION, AND REPAIR OF RIGHT ARM WOUNDS, 12 cm complex Laceration, V-shaped in antecubital fossa, requiring 3-layer complex repair---Dr. Lindie Spruce 05/31/14     Plan:     Sutures and staples were removed.  i did not appreciate any fluctuance, but there was noted erythema that was not present previously.  Start on keflex x1 week.  Stop bacitrain.  Tramadol was refilled.  Follow up in 1 week for a wound check.  The patient and wife know to  call or come to the ED should her develop fever, chills, increased erythema, or malodorous drainage from the site.  Continue with daily dressing changes, telfa, wrap in curlix.  Elevate extremity.  May not return to work at this time.  Will re-evaluate in 1 week.  Shane Saunders, ANP-BC

## 2014-06-07 NOTE — Patient Instructions (Signed)
Daily dressing change  Call with fevers, increasing redness at the site, smelly drainage, increased swelling.  Follow up in 1 week

## 2014-06-09 ENCOUNTER — Other Ambulatory Visit (INDEPENDENT_AMBULATORY_CARE_PROVIDER_SITE_OTHER): Payer: Self-pay | Admitting: General Surgery

## 2014-06-09 ENCOUNTER — Telehealth (INDEPENDENT_AMBULATORY_CARE_PROVIDER_SITE_OTHER): Payer: Self-pay | Admitting: General Surgery

## 2014-06-09 MED ORDER — HYDROXYZINE HCL 10 MG PO TABS: 10.0000 mg | ORAL_TABLET | Freq: Four times a day (QID) | ORAL | Status: DC | PRN

## 2014-06-09 MED ORDER — SULFAMETHOXAZOLE-TRIMETHOPRIM 400-80 MG PO TABS
1.0000 | ORAL_TABLET | Freq: Two times a day (BID) | ORAL | Status: DC
Start: 2014-06-09 — End: 2014-06-14

## 2014-06-09 NOTE — Telephone Encounter (Signed)
Pt's wife called [facility] office to report allergic reaction to (probably Keflex.)  Med started Wednesday evening and pt was seen in clinic yesterday, with mild area reaction beginning then.  Now pt's entire body is broken out.  Advised them to stop the antibiotic and call Trauma office now; provided wife the phone number.  She will call now.

## 2014-06-09 NOTE — Telephone Encounter (Signed)
Spoke with Lynden Angathy, OT order refaxed.  Spoke with wife, pt c/o itching to upper back, which he had prior to starting keflex and pruritis to LUE.  Erythema has improved.  No rashes.  Rx for atarax to help with pruritis, if no improvement, stop keflex and start bactrim x5 days.  He is to continue with triamcinolone cream along with eucerin/lubriderm to upper back.  Call with questions or concerns.  Follow up on Wednesday.   Dai Apel, ANP-BC

## 2014-06-13 NOTE — Telephone Encounter (Signed)
Left message confirming appt date and time.

## 2014-06-14 ENCOUNTER — Encounter (INDEPENDENT_AMBULATORY_CARE_PROVIDER_SITE_OTHER): Payer: Self-pay | Admitting: *Deleted

## 2014-06-14 ENCOUNTER — Ambulatory Visit (INDEPENDENT_AMBULATORY_CARE_PROVIDER_SITE_OTHER): Payer: Worker's Compensation | Admitting: General Surgery

## 2014-06-14 ENCOUNTER — Encounter (INDEPENDENT_AMBULATORY_CARE_PROVIDER_SITE_OTHER): Payer: Self-pay

## 2014-06-14 VITALS — BP 120/80 | HR 72 | Temp 98.0°F | Resp 14 | Ht 70.0 in | Wt 240.0 lb

## 2014-06-14 DIAGNOSIS — R5383 Other fatigue: Secondary | ICD-10-CM

## 2014-06-14 DIAGNOSIS — S41109A Unspecified open wound of unspecified upper arm, initial encounter: Secondary | ICD-10-CM

## 2014-06-14 DIAGNOSIS — S51839A Puncture wound without foreign body of unspecified forearm, initial encounter: Secondary | ICD-10-CM

## 2014-06-14 DIAGNOSIS — S41111D Laceration without foreign body of right upper arm, subsequent encounter: Secondary | ICD-10-CM

## 2014-06-14 DIAGNOSIS — S51809A Unspecified open wound of unspecified forearm, initial encounter: Secondary | ICD-10-CM

## 2014-06-14 DIAGNOSIS — Z5189 Encounter for other specified aftercare: Secondary | ICD-10-CM

## 2014-06-14 DIAGNOSIS — R5381 Other malaise: Secondary | ICD-10-CM

## 2014-06-14 MED ORDER — TRAMADOL HCL 50 MG PO TABS: 100.0000 mg | ORAL_TABLET | Freq: Four times a day (QID) | ORAL | Status: DC

## 2014-06-14 MED ORDER — METHOCARBAMOL 500 MG PO TABS: 1000.0000 mg | ORAL_TABLET | Freq: Three times a day (TID) | ORAL | Status: DC | PRN

## 2014-06-14 MED ORDER — MELOXICAM 15 MG PO TABS: 7.5000 mg | ORAL_TABLET | Freq: Every day | ORAL | Status: DC

## 2014-06-14 NOTE — Progress Notes (Signed)
Subjective: Shane Saunders is a 43 y.o. male who presents today for follow up from Job related injury (1" pipe injuring his right forearm) after short hospital stay.  [facility] as a level 1 trauma following a job related injury, 1" pipe pinned his right forearm. He had significant blood loss at the scene and a tourniquet was applied which was released upon ED arrival without any significant blood loss. CTA of the arm did not reveal any arterial injury. He had exploration of the wound and repair of the laceration in the OR, vascular surgery was present. He was discharged home on POD#4 (06/04/14). His wife has been doing daly dressing changes and applying bacitracin. She has noticed some erythema and skin breakdown.  He was started on Keflex then switched to Bactrim due to suspected allergy (itching).  However, he realized that it was actually the ultram causing his itching.  He continued the Keflex and is still taking it.  He may have missed a few doses which is why he has some left.  The patient denies fever, chills or sweats. He is only taking tramadol and mobic for pain. He's started occupational therapy and its hard for him but going well.  Concerned he can't do his home exercises every hour due to pain.  Notes significant fatigue.  Feels tired after doing minimal tasks.  Wife is concerned for PTSD symptoms, but he denies such symptoms.    Objective: Vital signs in last 24 hours: Reviewed   PE: General appearance: alert and oriented. Calm and cooperative No acute distress. VSS. Afebrile. Pulses: +2 bilateral distal pulses without cyanosis  Psych: Mental status: A&O x 3, appears anxious Skin: Wound erythema improved as below.  Still with edema and tightness of the skin.  Eschar present but appears to be healing well.  Decreased rom/strength.  Some decreased sensation, but grossly intact   06/07/14   06/14/14   Assessment/Plan S/P IRRIGATION, EXPLORATION, AND REPAIR OF RIGHT ARM  WOUNDS, 12 cm complex Laceration, V-shaped in antecubital fossa, requiring 3-layer complex repair---Dr. Lindie SpruceWyatt 05/31/14 Fatigue  Plan: 1.  Here for wound check.  The wound is much improved, erythema improved, some how has not finished antibiotic, may have missed a few doses.  Will continue until its gone.   2.  Follow up in 2 weeks for a recheck, continue wound care until full healed. 3.  Continue OT, may need orthopedic evaluation at some point if ROM/strength not improving 4.  Will order CMET and CBC given patients complaint of fatigue, Wife brings up about possible post-traumatic stress.  Recommended follow up with his PCP as soon as possible.     Aris GeorgiaDORT, Shane Sulkowski, PA-C 06/14/2014  Addendum 06/16/14 1455pm:  Called and talked to the patient regarding his lab work which is all normal.  He says OT is going well and he seems to be doing better than on Wednesday.  CBC    Component Value Date/Time   WBC 7.3 06/14/2014 1714   RBC 4.68 06/14/2014 1714   HGB 14.6 06/14/2014 1714   HCT 41.0 06/14/2014 1714   PLT SEE NOTE 06/14/2014 1714   MCV 87.6 06/14/2014 1714   MCH 31.2 06/14/2014 1714   MCHC 35.6 06/14/2014 1714   RDW 14.3 06/14/2014 1714   LYMPHSABS 2.1 06/14/2014 1714   MONOABS 0.7 06/14/2014 1714   EOSABS 0.3 06/14/2014 1714   BASOSABS 0.1 06/14/2014 1714   CMP     Component Value Date/Time   NA 139 06/14/2014  1714   K 4.5 06/14/2014 1714   CL 103 06/14/2014 1714   CO2 27 06/14/2014 1714   GLUCOSE 73 06/14/2014 1714   BUN 15 06/14/2014 1714   CREATININE 1.14 06/14/2014 1714   CREATININE 1.12 06/02/2014 0900   CALCIUM 9.6 06/14/2014 1714   PROT 7.0 06/14/2014 1714   ALBUMIN 4.4 06/14/2014 1714   AST 18 06/14/2014 1714   ALT 25 06/14/2014 1714   ALKPHOS 71 06/14/2014 1714   BILITOT 0.6 06/14/2014 1714   GFRNONAA 78* 06/02/2014 0900   GFRNONAA >89 05/25/2014 1603   GFRAA >90 06/02/2014 0900   GFRAA >89 05/25/2014 1603

## 2014-06-14 NOTE — Patient Instructions (Addendum)
Continue with dressing changes daily.  Continue OT.  Continue prescriptions, will refill ultram and mobic.  Start robaxin muscle relaxer.  Follow up with us in 2 weeks for a recheck.  No work until further notice.  Obtain labs.

## 2014-06-15 ENCOUNTER — Other Ambulatory Visit: Payer: Self-pay | Admitting: Emergency Medicine

## 2014-06-15 LAB — COMPREHENSIVE METABOLIC PANEL
ALT: 25 U/L (ref 0–53)
AST: 18 U/L (ref 0–37)
Albumin: 4.4 g/dL (ref 3.5–5.2)
Alkaline Phosphatase: 71 U/L (ref 39–117)
BUN: 15 mg/dL (ref 6–23)
CO2: 27 mEq/L (ref 19–32)
Calcium: 9.6 mg/dL (ref 8.4–10.5)
Chloride: 103 mEq/L (ref 96–112)
Creat: 1.14 mg/dL (ref 0.50–1.35)
Glucose, Bld: 73 mg/dL (ref 70–99)
Potassium: 4.5 mEq/L (ref 3.5–5.3)
Sodium: 139 mEq/L (ref 135–145)
Total Bilirubin: 0.6 mg/dL (ref 0.2–1.2)
Total Protein: 7 g/dL (ref 6.0–8.3)

## 2014-06-15 LAB — CBC WITH DIFFERENTIAL/PLATELET
Basophils Absolute: 0.1 10*3/uL (ref 0.0–0.1)
Basophils Relative: 1 % (ref 0–1)
Eosinophils Absolute: 0.3 10*3/uL (ref 0.0–0.7)
Eosinophils Relative: 4 % (ref 0–5)
HCT: 41 % (ref 39.0–52.0)
Hemoglobin: 14.6 g/dL (ref 13.0–17.0)
Lymphocytes Relative: 29 % (ref 12–46)
Lymphs Abs: 2.1 10*3/uL (ref 0.7–4.0)
MCH: 31.2 pg (ref 26.0–34.0)
MCHC: 35.6 g/dL (ref 30.0–36.0)
MCV: 87.6 fL (ref 78.0–100.0)
Monocytes Absolute: 0.7 10*3/uL (ref 0.1–1.0)
Monocytes Relative: 9 % (ref 3–12)
Neutro Abs: 4.2 10*3/uL (ref 1.7–7.7)
Neutrophils Relative %: 57 % (ref 43–77)
RBC: 4.68 MIL/uL (ref 4.22–5.81)
RDW: 14.3 % (ref 11.5–15.5)
WBC: 7.3 10*3/uL (ref 4.0–10.5)

## 2014-06-15 MED ORDER — ALPRAZOLAM ER 2 MG PO TB24: 2.0000 mg | ORAL_TABLET | ORAL | Status: DC

## 2014-06-16 ENCOUNTER — Telehealth (INDEPENDENT_AMBULATORY_CARE_PROVIDER_SITE_OTHER): Payer: Self-pay | Admitting: General Surgery

## 2014-06-16 NOTE — Telephone Encounter (Signed)
See clinic note from 06/14/14

## 2014-06-28 ENCOUNTER — Encounter (INDEPENDENT_AMBULATORY_CARE_PROVIDER_SITE_OTHER): Payer: Self-pay | Admitting: *Deleted

## 2014-06-28 ENCOUNTER — Telehealth (HOSPITAL_COMMUNITY): Payer: Self-pay

## 2014-06-28 ENCOUNTER — Ambulatory Visit (INDEPENDENT_AMBULATORY_CARE_PROVIDER_SITE_OTHER): Payer: Worker's Compensation | Admitting: Orthopedic Surgery

## 2014-06-28 ENCOUNTER — Encounter (INDEPENDENT_AMBULATORY_CARE_PROVIDER_SITE_OTHER): Payer: Self-pay

## 2014-06-28 VITALS — BP 138/78 | HR 80 | Temp 98.0°F | Resp 14 | Ht 70.0 in | Wt 242.2 lb

## 2014-06-28 DIAGNOSIS — S59919A Unspecified injury of unspecified forearm, initial encounter: Secondary | ICD-10-CM

## 2014-06-28 DIAGNOSIS — S6990XA Unspecified injury of unspecified wrist, hand and finger(s), initial encounter: Secondary | ICD-10-CM

## 2014-06-28 DIAGNOSIS — S51809A Unspecified open wound of unspecified forearm, initial encounter: Secondary | ICD-10-CM

## 2014-06-28 DIAGNOSIS — S59909A Unspecified injury of unspecified elbow, initial encounter: Secondary | ICD-10-CM

## 2014-06-28 DIAGNOSIS — S51831D Puncture wound without foreign body of right forearm, subsequent encounter: Secondary | ICD-10-CM

## 2014-06-28 MED ORDER — METHOCARBAMOL 500 MG PO TABS: 1000.0000 mg | ORAL_TABLET | Freq: Four times a day (QID) | ORAL | Status: DC | PRN

## 2014-06-28 NOTE — Progress Notes (Signed)
Subjective Shane Saunders comes in s/p crush injury to his right arm on 6/3. He notes his wound has been slowly improving. He has also noted some return of sensation on the volar aspect of his forearm with hypersensitivity that is not painful. He continues to attend occupational therapy at the hand center regularly.   Objective RUE: Wound is about the size of a quarter, ~25% granulation, skin level, healthy surrounding skin. Mild NP edema noted in forearm. 1+ radial pulse. Forearm extension ~175 degrees, flexion looks about 15 degrees shy of full. Finger extension is good though he feels some pulling on the dorsal aspect and, in fact, there is clearly some muscular tethering evident. Flexion is still limited by strength though range of motion has improved.   Assessment & Plan Crush injury RLE -- Advised wound care with abx ointment. Continue OT. Discussed use of compression sleeve or Lyrica for paresthesias if symptoms worsened. F/u in 1 month to review continued functional limitations and for wound check.    Shane CaldronMichael J. Trevyn Lumpkin, PA-C Pager: [phone] General Trauma PA Pager: [phone]

## 2014-06-28 NOTE — Patient Instructions (Signed)
Wash wounds daily in shower with soap and water. Do not soak. Apply antibiotic ointment (e.g. Neosporin) twice daily and as needed to keep moist. Cover with dry dressing.  

## 2014-07-06 ENCOUNTER — Encounter: Payer: Self-pay | Admitting: Internal Medicine

## 2014-07-06 ENCOUNTER — Ambulatory Visit (INDEPENDENT_AMBULATORY_CARE_PROVIDER_SITE_OTHER): Payer: 59 | Admitting: Internal Medicine

## 2014-07-06 VITALS — BP 120/66 | HR 76 | Temp 97.9°F | Resp 18 | Ht 70.5 in | Wt 244.2 lb

## 2014-07-06 DIAGNOSIS — S51831S Puncture wound without foreign body of right forearm, sequela: Secondary | ICD-10-CM

## 2014-07-06 DIAGNOSIS — I10 Essential (primary) hypertension: Secondary | ICD-10-CM

## 2014-07-06 DIAGNOSIS — IMO0002 Reserved for concepts with insufficient information to code with codable children: Secondary | ICD-10-CM

## 2014-07-06 NOTE — Progress Notes (Signed)
Subjective:    Patient ID: Shane Saunders, male    DOB: 09-03-1971, 43 y.o.   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 P/O he has moderate contracturing of the wrist and c/o pain in the Right wrist which he describes as throbbing and aching like a toothache. He is attending focused LPT 2 x /week.   Medication List   acetaminophen 325 MG tablet  Commonly known as:  TYLENOL  Take 2 tablets (650 mg total) by mouth every 4 (four) hours as needed for headache.     ALLERGY MEDICATION PO  Take 1 tablet by mouth daily.     ALPRAZolam 2 MG 24 hr tablet  Commonly known as:  XANAX XR  Take 1 tablet (2 mg total) by mouth every morning.     atorvastatin 80 MG tablet  Commonly known as:  LIPITOR  Take 80 mg by mouth daily.     citalopram 40 MG tablet  Commonly known as:  CELEXA     hydrOXYzine 10 MG tablet  Commonly known as:  ATARAX/VISTARIL  Take 1 tablet (10 mg total) by mouth every 6 (six) hours as needed.     Levomilnacipran HCl ER 40 MG Cp24  Commonly known as:  FETZIMA  Start with 20 mg qhs x 6 days then 40 mg qhs     meloxicam 15 MG tablet  Commonly known as:  MOBIC  Take 0.5-1 tablets (7.5-15 mg total) by mouth daily.     methocarbamol 500 MG tablet  Commonly known as:  ROBAXIN  Take 2 tablets (1,000 mg total) by mouth every 6 (six) hours as needed for muscle spasms.     traMADol 50 MG tablet  Commonly known as:  ULTRAM  Take 2 tablets (100 mg total) by mouth every 6 (six) hours.     Vitamin D 2000 UNITS Caps  Take 8,000 Units by mouth daily.     Allergies  Allergen Reactions  . Codeine Other (See Comments)    unknown  . Effexor [Venlafaxine]   . Pravastatin   . Prednisone     Decrease sleep  . Zinc    Past Medical History  Diagnosis Date  . Hypertension   . Hyperlipidemia   . Prediabetes   . Obesity (BMI 30-39.9)   .  Hypogonadism male   . Vitamin D deficiency   . Anemia   . High cholesterol   . OSA on CPAP   . Anxiety   . H/O hiatal hernia   . Arthritis     "knees" (05/31/2014)  . Depression    Past Surgical History  Procedure Laterality Date  . Vasectomy    . Abdominal exploration surgery  1998    Celiotomy  . Appendectomy  1998    during exploratory lap  . Arm wound repair / closure Right 05/31/2014    irrigation and exploration/notes 05/31/2014  . Abdominal exploration surgery  1990's    "appendix was on the wrong side; colon twisted"  . Appendectomy  1990's  . Colon surgery  1990's    "colon twisted and put back in place; didn't take any of it out"  . Artery repair Right 05/31/2014    Procedure: IRRIGATION, EXPLORATION, AND REPAIR OF RIGHT ARM WOUND;  Surgeon: Cherylynn Ridges, MD;  Location: [facility] OR;  Service: General;  Laterality: Right;  Review of Systems non contributory to above  Objective:   Physical Exam  BP 120/66  P 76  T 97.9 F   Resp 18  Ht 5' 10.5"  Wt 244 lb 3.2 oz   BMI 34.53 kg/m2  Exam of the RUE finds sympathetic swelling from the elbow to the hand. An approx. broad 5" well healed scar of the proximal/medial Rt forearm. Elbow ROM appears Nl. Rt Wrist extension is limited to approx 20 deq from an expected 60-70 deg.  Assessment & Plan:   1. Essential hypertension  2. Penetrating forearm wound, right, sequela  -Patient advised to continue LPT 2 x / wk and continue stretching exercises on his own the other 5 days/week. 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

## 2014-07-26 ENCOUNTER — Ambulatory Visit (INDEPENDENT_AMBULATORY_CARE_PROVIDER_SITE_OTHER): Payer: Worker's Compensation | Admitting: Orthopedic Surgery

## 2014-07-26 ENCOUNTER — Other Ambulatory Visit (INDEPENDENT_AMBULATORY_CARE_PROVIDER_SITE_OTHER): Payer: Self-pay | Admitting: *Deleted

## 2014-07-26 ENCOUNTER — Encounter (INDEPENDENT_AMBULATORY_CARE_PROVIDER_SITE_OTHER): Payer: Self-pay | Admitting: Orthopedic Surgery

## 2014-07-26 ENCOUNTER — Encounter (INDEPENDENT_AMBULATORY_CARE_PROVIDER_SITE_OTHER): Payer: Self-pay

## 2014-07-26 VITALS — BP 146/100 | HR 72 | Temp 98.0°F | Resp 14 | Ht 70.0 in | Wt 238.6 lb

## 2014-07-26 DIAGNOSIS — S51809A Unspecified open wound of unspecified forearm, initial encounter: Secondary | ICD-10-CM

## 2014-07-26 DIAGNOSIS — S51811A Laceration without foreign body of right forearm, initial encounter: Secondary | ICD-10-CM

## 2014-07-26 DIAGNOSIS — S51831D Puncture wound without foreign body of right forearm, subsequent encounter: Secondary | ICD-10-CM

## 2014-07-26 DIAGNOSIS — Z5189 Encounter for other specified aftercare: Secondary | ICD-10-CM

## 2014-07-26 NOTE — Progress Notes (Signed)
Subjective Barbara CowerJason comes in about 7 weeks s/p crush injury/laceration to his right forearm. He continues to work with occupational therapy and continues to make progress though it is slow going. He does not report much pain except during and immediately after therapy but does not need to take pain meds for it. He has had some rare and brief neurologic symptoms in the forearm like numbness and hyperesthesias but they are not bothersome. He does have some muscle spasms with use/therapy.   Objective RUE: Lacerations are well-healed. PROM is improving. Radial/ulnar pulses 2+.   Assessment & Plan RUE injury -- I would like to obtain a consult from a hand surgeon at this point to make sure we are continuing on the right track. Will make a referral to Dr. Dominica SeverinWilliam Gramig for an opinion. Continue OT in the meantime. Follow up here will be dependent on Dr. Carlos LeveringGramig's recommendations.    Freeman CaldronMichael J. Deisi Salonga, PA-C Pager: [phone] General Trauma [place] Pager: [phone]

## 2014-08-15 ENCOUNTER — Encounter: Payer: Self-pay | Admitting: Internal Medicine

## 2014-08-15 NOTE — Progress Notes (Signed)
Patient ID: Shane PaliJason J Abreu, male   DOB: 06-18-1971, 43 y.o.   MRN: [id number]   [name]  O    S  H  O  W    for   C  P  E

## 2014-08-16 ENCOUNTER — Other Ambulatory Visit: Payer: Self-pay | Admitting: Emergency Medicine

## 2014-08-30 ENCOUNTER — Encounter (INDEPENDENT_AMBULATORY_CARE_PROVIDER_SITE_OTHER): Payer: Worker's Compensation

## 2014-09-05 ENCOUNTER — Telehealth: Payer: Self-pay | Admitting: *Deleted

## 2014-09-05 NOTE — Telephone Encounter (Signed)
Patient called and states he has upset stomach with nausea and headache x 3 days.  Per Dr Oneta Rack, try Levisin.  Patient already has RX for Levisin and will start taking the med.

## 2014-09-06 ENCOUNTER — Encounter: Payer: Self-pay | Admitting: Internal Medicine

## 2014-09-16 ENCOUNTER — Other Ambulatory Visit: Payer: Self-pay | Admitting: Physician Assistant

## 2014-11-07 ENCOUNTER — Other Ambulatory Visit (HOSPITAL_COMMUNITY): Payer: Self-pay | Admitting: Orthopedic Surgery

## 2014-11-07 ENCOUNTER — Ambulatory Visit (HOSPITAL_COMMUNITY)
Admission: RE | Admit: 2014-11-07 | Discharge: 2014-11-07 | Disposition: A | Discharge status: Home / Self Care | Payer: Worker's Compensation | Source: Ambulatory Visit | Attending: Orthopedic Surgery | Admitting: Orthopedic Surgery

## 2014-11-07 DIAGNOSIS — Z01818 Encounter for other preprocedural examination: Secondary | ICD-10-CM | POA: Diagnosis present

## 2014-11-07 DIAGNOSIS — M79601 Pain in right arm: Secondary | ICD-10-CM

## 2014-11-16 ENCOUNTER — Other Ambulatory Visit: Payer: Self-pay | Admitting: Physician Assistant

## 2014-11-28 ENCOUNTER — Other Ambulatory Visit: Payer: Self-pay | Admitting: Physician Assistant

## 2014-11-29 NOTE — Telephone Encounter (Signed)
Called Rx in to [facility]

## 2015-01-16 ENCOUNTER — Encounter: Payer: Self-pay | Admitting: Internal Medicine

## 2015-01-16 ENCOUNTER — Ambulatory Visit (INDEPENDENT_AMBULATORY_CARE_PROVIDER_SITE_OTHER): Payer: 59 | Admitting: Internal Medicine

## 2015-01-16 VITALS — BP 114/80 | HR 72 | Temp 97.7°F | Resp 16 | Ht 70.0 in | Wt 247.2 lb

## 2015-01-16 DIAGNOSIS — R945 Abnormal results of liver function studies: Secondary | ICD-10-CM

## 2015-01-16 DIAGNOSIS — E291 Testicular hypofunction: Secondary | ICD-10-CM

## 2015-01-16 DIAGNOSIS — Z113 Encounter for screening for infections with a predominantly sexual mode of transmission: Secondary | ICD-10-CM

## 2015-01-16 DIAGNOSIS — Z1212 Encounter for screening for malignant neoplasm of rectum: Secondary | ICD-10-CM

## 2015-01-16 DIAGNOSIS — I10 Essential (primary) hypertension: Secondary | ICD-10-CM

## 2015-01-16 DIAGNOSIS — R7303 Prediabetes: Secondary | ICD-10-CM

## 2015-01-16 DIAGNOSIS — Z125 Encounter for screening for malignant neoplasm of prostate: Secondary | ICD-10-CM

## 2015-01-16 DIAGNOSIS — R5383 Other fatigue: Secondary | ICD-10-CM

## 2015-01-16 DIAGNOSIS — Z9989 Dependence on other enabling machines and devices: Secondary | ICD-10-CM

## 2015-01-16 DIAGNOSIS — E785 Hyperlipidemia, unspecified: Secondary | ICD-10-CM

## 2015-01-16 DIAGNOSIS — Z79899 Other long term (current) drug therapy: Secondary | ICD-10-CM

## 2015-01-16 DIAGNOSIS — G4733 Obstructive sleep apnea (adult) (pediatric): Secondary | ICD-10-CM

## 2015-01-16 DIAGNOSIS — E559 Vitamin D deficiency, unspecified: Secondary | ICD-10-CM

## 2015-01-16 DIAGNOSIS — R7989 Other specified abnormal findings of blood chemistry: Secondary | ICD-10-CM

## 2015-01-16 DIAGNOSIS — R7309 Other abnormal glucose: Secondary | ICD-10-CM

## 2015-01-16 DIAGNOSIS — Z111 Encounter for screening for respiratory tuberculosis: Secondary | ICD-10-CM

## 2015-01-16 LAB — HEMOGLOBIN A1C
Hgb A1c MFr Bld: 5.4 % (ref <5.7)
Mean Plasma Glucose: 108 mg/dL (ref <117)

## 2015-01-16 NOTE — Patient Instructions (Signed)
 Recommend the book "The END of DIETING" by Dr Joel Fuhrman   & the book "The END of DIABETES " by Dr Joel Fuhrman  At [employer] - get book & Audio CD's      Being diabetic has a  300% increased risk for heart attack, stroke, cancer, and alzheimer- type vascular dementia. It is very important that you work harder with diet by avoiding all foods that are white except chicken & fish. Avoid white rice (brown & wild rice is OK), white potatoes (sweetpotatoes in moderation is OK), White bread or wheat bread or anything made out of white flour like bagels, donuts, rolls, buns, biscuits, cakes, pastries, cookies, pizza crust, and pasta (made from white flour & egg whites) - vegetarian pasta or spinach or wheat pasta is OK. Multigrain breads like Arnold's or [employer], or multigrain sandwich thins or flatbreads.  Diet, exercise and weight loss can reverse and cure diabetes in the early stages.  Diet, exercise and weight loss is very important in the control and prevention of complications of diabetes which affects every system in your body, ie. Brain - dementia/stroke, eyes - glaucoma/blindness, heart - heart attack/heart failure, kidneys - dialysis, stomach - gastric paralysis, intestines - malabsorption, nerves - severe painful neuritis, circulation - gangrene & loss of a leg(s), and finally cancer and Alzheimers.    I recommend avoid fried & greasy foods,  sweets/candy, white rice (brown or wild rice or Quinoa is OK), white potatoes (sweet potatoes are OK) - anything made from white flour - bagels, doughnuts, rolls, buns, biscuits,white and wheat breads, pizza crust and traditional pasta made of white flour & egg white(vegetarian pasta or spinach or wheat pasta is OK).  Multi-grain bread is OK - like multi-grain flat bread or sandwich thins. Avoid alcohol in excess. Exercise is also important.    Eat all the vegetables you want - avoid meat, especially red meat and dairy - especially cheese.  Cheese  is the most concentrated form of trans-fats which is the worst thing to clog up our arteries. Veggie cheese is OK which can be found in the fresh produce section at [employer] or [employer] or [employer]  Preventive Care for Adults  A healthy lifestyle and preventive care can promote health and wellness. Preventive health guidelines for men include the following key practices:  A routine yearly physical is a good way to check with your health care provider about your health and preventative screening. It is a chance to share any concerns and updates on your health and to receive a thorough exam.  Visit your dentist for a routine exam and preventative care every 6 months. Brush your teeth twice a day and floss once a day. Good oral hygiene prevents tooth decay and gum disease.  The frequency of eye exams is based on your age, health, family medical history, use of contact lenses, and other factors. Follow your health care provider's recommendations for frequency of eye exams.  Eat a healthy diet. Foods such as vegetables, fruits, whole grains, low-fat dairy products, and lean protein foods contain the nutrients you need without too many calories. Decrease your intake of foods high in solid fats, added sugars, and salt. Eat the right amount of calories for you.Get information about a proper diet from your health care provider, if necessary.  Regular physical exercise is one of the most important things you can do for your health. Most adults should get at least 150 minutes of moderate-intensity exercise (any activity   that increases your heart rate and causes you to sweat) each week. In addition, most adults need muscle-strengthening exercises on 2 or more days a week.  Maintain a healthy weight. The body mass index (BMI) is a screening tool to identify possible weight problems. It provides an estimate of body fat based on height and weight. Your health care provider can find your BMI and can help  you achieve or maintain a healthy weight.For adults 20 years and older:  A BMI below 18.5 is considered underweight.  A BMI of 18.5 to 24.9 is normal.  A BMI of 25 to 29.9 is considered overweight.  A BMI of 30 and above is considered obese.  Maintain normal blood lipids and cholesterol levels by exercising and minimizing your intake of saturated fat. Eat a balanced diet with plenty of fruit and vegetables. Blood tests for lipids and cholesterol should begin at age 20 and be repeated every 5 years. If your lipid or cholesterol levels are high, you are over 50, or you are at high risk for heart disease, you may need your cholesterol levels checked more frequently.Ongoing high lipid and cholesterol levels should be treated with medicines if diet and exercise are not working.  If you smoke, find out from your health care provider how to quit. If you do not use tobacco, do not start.  Lung cancer screening is recommended for adults aged 55-80 years who are at high risk for developing lung cancer because of a history of smoking. A yearly low-dose CT scan of the lungs is recommended for people who have at least a 30-pack-year history of smoking and are a current smoker or have quit within the past 15 years. A pack year of smoking is smoking an average of 1 pack of cigarettes a day for 1 year (for example: 1 pack a day for 30 years or 2 packs a day for 15 years). Yearly screening should continue until the smoker has stopped smoking for at least 15 years. Yearly screening should be stopped for people who develop a health problem that would prevent them from having lung cancer treatment.  If you choose to drink alcohol, do not have more than 2 drinks per day. One drink is considered to be 12 ounces (355 mL) of beer, 5 ounces (148 mL) of wine, or 1.5 ounces (44 mL) of liquor.  Avoid use of street drugs. Do not share needles with anyone. Ask for help if you need support or instructions about stopping the  use of drugs.  High blood pressure causes heart disease and increases the risk of stroke. Your blood pressure should be checked at least every 1-2 years. Ongoing high blood pressure should be treated with medicines, if weight loss and exercise are not effective.  If you are 45-79 years old, ask your health care provider if you should take aspirin to prevent heart disease.  Diabetes screening involves taking a blood sample to check your fasting blood sugar level. This should be done once every 3 years, after age 45, if you are within normal weight and without risk factors for diabetes. Testing should be considered at a younger age or be carried out more frequently if you are overweight and have at least 1 risk factor for diabetes.  Colorectal cancer can be detected and often prevented. Most routine colorectal cancer screening begins at the age of 50 and continues through age 75. However, your health care provider may recommend screening at an earlier age if you have   risk factors for colon cancer. On a yearly basis, your health care provider may provide home test kits to check for hidden blood in the stool. Use of a small camera at the end of a tube to directly examine the colon (sigmoidoscopy or colonoscopy) can detect the earliest forms of colorectal cancer. Talk to your health care provider about this at age 50, when routine screening begins. Direct exam of the colon should be repeated every 5-10 years through age 75, unless early forms of precancerous polyps or small growths are found.   Talk with your health care provider about prostate cancer screening.  Testicular cancer screening isrecommended for adult males. Screening includes self-exam, a health care provider exam, and other screening tests. Consult with your health care provider about any symptoms you have or any concerns you have about testicular cancer.  Use sunscreen. Apply sunscreen liberally and repeatedly throughout the day. You should  seek shade when your shadow is shorter than you. Protect yourself by wearing long sleeves, pants, a wide-brimmed hat, and sunglasses year round, whenever you are outdoors.  Once a month, do a whole-body skin exam, using a mirror to look at the skin on your back. Tell your health care provider about new moles, moles that have irregular borders, moles that are larger than a pencil eraser, or moles that have changed in shape or color.  Stay current with required vaccines (immunizations).  Influenza vaccine. All adults should be immunized every year.  Tetanus, diphtheria, and acellular pertussis (Td, Tdap) vaccine. An adult who has not previously received Tdap or who does not know his vaccine status should receive 1 dose of Tdap. This initial dose should be followed by tetanus and diphtheria toxoids (Td) booster doses every 10 years. Adults with an unknown or incomplete history of completing a 3-dose immunization series with Td-containing vaccines should begin or complete a primary immunization series including a Tdap dose. Adults should receive a Td booster every 10 years.  Varicella vaccine. An adult without evidence of immunity to varicella should receive 2 doses or a second dose if he has previously received 1 dose.  Human papillomavirus (HPV) vaccine. Males aged 13-21 years who have not received the vaccine previously should receive the 3-dose series. Males aged 22-26 years may be immunized. Immunization is recommended through the age of 26 years for any male who has sex with males and did not get any or all doses earlier. Immunization is recommended for any person with an immunocompromised condition through the age of 26 years if he did not get any or all doses earlier. During the 3-dose series, the second dose should be obtained 4-8 weeks after the first dose. The third dose should be obtained 24 weeks after the first dose and 16 weeks after the second dose.  Zoster vaccine. One dose is recommended  for adults aged 60 years or older unless certain conditions are present.    PREVNAR  - Pneumococcal 13-valent conjugate (PCV13) vaccine. When indicated, a person who is uncertain of his immunization history and has no record of immunization should receive the PCV13 vaccine. An adult aged 19 years or older who has certain medical conditions and has not been previously immunized should receive 1 dose of PCV13 vaccine. This PCV13 should be followed with a dose of pneumococcal polysaccharide (PPSV23) vaccine. The PPSV23 vaccine dose should be obtained at least 8 weeks after the dose of PCV13 vaccine. An adult aged 19 years or older who has certain medical conditions and previously   received 1 or more doses of PPSV23 vaccine should receive 1 dose of PCV13. The PCV13 vaccine dose should be obtained 1 or more years after the last PPSV23 vaccine dose.    PNEUMOVAX - Pneumococcal polysaccharide (PPSV23) vaccine. When PCV13 is also indicated, PCV13 should be obtained first. All adults aged 65 years and older should be immunized. An adult younger than age 65 years who has certain medical conditions should be immunized. Any person who resides in a nursing home or long-term care facility should be immunized. An adult smoker should be immunized. People with an immunocompromised condition and certain other conditions should receive both PCV13 and PPSV23 vaccines. People with human immunodeficiency virus (HIV) infection should be immunized as soon as possible after diagnosis. Immunization during chemotherapy or radiation therapy should be avoided. Routine use of PPSV23 vaccine is not recommended for American Indians, [place] Natives, or people younger than 65 years unless there are medical conditions that require PPSV23 vaccine. When indicated, people who have unknown immunization and have no record of immunization should receive PPSV23 vaccine. One-time revaccination 5 years after the first dose of PPSV23 is recommended for  people aged 19-64 years who have chronic kidney failure, nephrotic syndrome, asplenia, or immunocompromised conditions. People who received 1-2 doses of PPSV23 before age 65 years should receive another dose of PPSV23 vaccine at age 65 years or later if at least 5 years have passed since the previous dose. Doses of PPSV23 are not needed for people immunized with PPSV23 at or after age 65 years.    Hepatitis A vaccine. Adults who wish to be protected from this disease, have certain high-risk conditions, work with hepatitis A-infected animals, work in hepatitis A research labs, or travel to or work in countries with a high rate of hepatitis A should be immunized. Adults who were previously unvaccinated and who anticipate close contact with an international adoptee during the first 60 days after arrival in the [place] from a country with a high rate of hepatitis A should be immunized.    Hepatitis B vaccine. Adults should be immunized if they wish to be protected from this disease, have certain high-risk conditions, may be exposed to blood or other infectious body fluids, are household contacts or sex partners of hepatitis B positive people, are clients or workers in certain care facilities, or travel to or work in countries with a high rate of hepatitis B.   Preventive Service / Frequency   Ages 40 to 64  Blood pressure check.  Lipid and cholesterol check  Lung cancer screening. / Every year if you are aged 55-80 years and have a 30-pack-year history of smoking and currently smoke or have quit within the past 15 years. Yearly screening is stopped once you have quit smoking for at least 15 years or develop a health problem that would prevent you from having lung cancer treatment.  Fecal occult blood test (FOBT) of stool. / Every year beginning at age 50 and continuing until age 75. You may not have to do this test if you get a colonoscopy every 10 years.  Flexible sigmoidoscopy** or  colonoscopy.** / Every 5 years for a flexible sigmoidoscopy or every 10 years for a colonoscopy beginning at age 50 and continuing until age 75. Screening for abdominal aortic aneurysm (AAA)  by ultrasound is recommended for people who have history of high blood pressure or who are current or former smokers. 

## 2015-01-16 NOTE — Progress Notes (Signed)
Patient ID: Shane Saunders, male   DOB: 01-04-1971, 44 y.o.   MRN: [id number] Annual Comprehensive Examination  This very nice 44 y.o.male presents for complete physical.  Patient has been followed for HTN,  OSA/CPAP, Morbid Obesity, Prediabetes, Hyperlipidemia, Testosterone  and Vitamin D Deficiency.   In June 2015, patient had an extensive work injury to his RUE and is near recovered having now returned to work 1/2 time for 2 weeks and just completing his 3rd week back and now at full time this week and has completed rehab. therapy. He does relate he has some residual weakness, but is able to perform his job and and is not limited otherwise.    HTN predates since 2010. Patient's BP has been controlled at home.Today's BP: 114/80 mmHg. Patient denies any cardiac symptoms as chest pain, palpitations, shortness of breath, dizziness or ankle swelling.   Patient's hyperlipidemia is controlled with diet and medications. Patient denies myalgias or other medication SE's. Last lipids were at goal - Total Chol 143; HDL 36; LDL  86; Trig 104 on 05/25/2014, but patient has since stopped his Atorvastatin.   Patient has Morbid Obesity (BMI 35.47) and consequent prediabetes with A1c 5.7% since Apr 2012 and patient denies reactive hypoglycemic symptoms, visual blurring, diabetic polys or paresthesias. Last A1c was 5.5% on 05/25/2014.   Patient was found to have low Testosterone 187.76 in 2012 and 205 in 2012 and has deferred therapy for lack of perceived need. Finally, patient has history of Vitamin D Deficiency of 33 in 2008 and last vitamin D was  48 02/08/2014, but patient has stopped his Vit D supplements.   Medication Sig  . ALPRAZolam (XANAX XR) 2 MG 24 hr tablet TAKE 1 TABLET EVERY DAY  . Cholecalciferol (VITAMIN D) 2000 UNITS CAPS Take 8,000 Units by mouth daily.  . citalopram (CELEXA) 40 MG tablet   . diphenhydrAMINE (SOMINEX) 25 MG tablet Take 25 mg by mouth at bedtime as needed for sleep.  . meloxicam  (MOBIC) 15 MG tablet Take 0.5-1 tablets (7.5-15 mg total) by mouth daily.   Allergies  Allergen Reactions  . Codeine Other (See Comments)    unknown  . Effexor [Venlafaxine]   . Pravastatin   . Prednisone     Decrease sleep  . Tramadol Hcl Itching  . Zinc    Past Medical History  Diagnosis Date  . Hypertension   . Hyperlipidemia   . Prediabetes   . Obesity (BMI 30-39.9)   . Hypogonadism male   . Vitamin D deficiency   . Anemia   . High cholesterol   . OSA on CPAP   . Anxiety   . H/O hiatal hernia   . Arthritis     "knees" (05/31/2014)  . Depression    Health Maintenance  Topic Date Due  . INFLUENZA VACCINE  07/30/2015  . TETANUS/TDAP  05/31/2024   Immunization History  Administered Date(s) Administered  . Influenza-Unspecified 10/20/2014  . Pneumococcal-Unspecified 10/27/2006  . Tdap 08/05/2013, 05/31/2014   Past Surgical History  Procedure Laterality Date  . Vasectomy    . Abdominal exploration surgery  1998    Celiotomy  . Appendectomy  1998    during exploratory lap  . Arm wound repair / closure Right 05/31/2014    irrigation and exploration/notes 05/31/2014  . Abdominal exploration surgery  1990's    "appendix was on the wrong side; colon twisted"  . Appendectomy  1990's  . Colon surgery  1990's    "colon twisted and  put back in place; didn't take any of it out"  . Artery repair Right 05/31/2014    Procedure: IRRIGATION, EXPLORATION, AND REPAIR OF RIGHT ARM WOUND;  Surgeon: Cherylynn RidgesJames O Wyatt, MD;  Location: [facility] OR;  Service: General;  Laterality: Right;   Family History  Problem Relation Age of Onset  . Hypertension Mother   . Migraines Mother   . Thyroid disease Mother   . Hypertension Father   . Hyperlipidemia Father   . Diabetes Father   . Cancer Sister     thyroid   History   Social History  . Marital Status: Married    Spouse Name: N/A    Number of Children: N/A  . 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 Smoking status: Former Smoker -- 0.50 packs/day for 10 years    Types: Cigarettes    Quit date: 10/27/2004  . Smokeless tobacco: Current User    Types: Snuff, Chew     Comment: "stopped smoking in ~ 2010"  . Alcohol Use: Yes     Comment: rarely  . Drug Use: No  . Sexual Activity: Yes     ROS Constitutional: Denies fever, chills, weight loss/gain, headaches, insomnia,  night sweats or change in appetite, but does report sone fatigue.  Eyes: Denies redness, blurred vision, diplopia, discharge, itchy or watery eyes.  ENT: Denies discharge, congestion, post nasal drip, epistaxis, sore throat, earache, hearing loss, dental pain, Tinnitus, Vertigo, Sinus pain or snoring.  Cardio: Denies chest pain, palpitations, irregular heartbeat, syncope, dyspnea, diaphoresis, orthopnea, PND, claudication or edema Respiratory: denies cough, dyspnea, DOE, pleurisy, hoarseness, laryngitis or wheezing.  Gastrointestinal: Denies dysphagia, heartburn, reflux, water brash, pain, cramps, nausea, vomiting, bloating, diarrhea, constipation, hematemesis, melena, hematochezia, jaundice or hemorrhoids Genitourinary: Denies dysuria, frequency, urgency, nocturia, hesitancy, discharge, hematuria or flank pain Musculoskeletal: Denies arthralgia, myalgia, stiffness, Jt. Swelling, pain, limp or strain/sprain. Denies Falls. Skin: Denies puritis, rash, hives, warts, acne, eczema or change in skin lesion Neuro: No weakness, tremor, incoordination, spasms, paresthesia or pain Psychiatric: Denies confusion, memory loss or sensory loss. Denies Depression. Endocrine: Denies change in weight, skin, hair change, nocturia, and paresthesia, diabetic polys, visual blurring or hyper / hypo glycemic episodes.  Heme/Lymph: No excessive bleeding, bruising or enlarged lymph nodes.  Physical Exam  BP 114/80   Pulse 72  Temp 97.7 F   Resp 16  Ht 5\' 10"    Wt 247 lb 3.2 oz    BMI 35.47    General Appearance: Well nourished, in no apparent distress. Eyes: PERRLA, EOMs, conjunctiva no swelling or erythema, normal fundi and vessels. Sinuses: No frontal/maxillary tenderness ENT/Mouth: EACs patent / TMs  nl. Nares clear without erythema, swelling, mucoid exudates. Oral hygiene is good. No erythema, swelling, or exudate. Tongue normal, non-obstructing. Tonsils not swollen or erythematous. Hearing normal.  Neck: Supple, thyroid normal. No bruits, nodes or JVD. Respiratory: Respiratory effort normal.  BS equal and clear bilateral without rales, rhonci, wheezing or stridor. Cardio: Heart sounds are normal with regular rate and rhythm and no murmurs, rubs or gallops. Peripheral pulses are normal and equal bilaterally without edema. No aortic or femoral bruits. Chest: symmetric with normal excursions and percussion.  Abdomen: Flat, soft, with bowl sounds. Nontender, no guarding, rebound, hernias, masses, or organomegaly.  Lymphatics: Non tender without lymphadenopathy.  Genitourinary: No hernias.Testes nl. DRE - prostate nl for age - smooth & firm w/o nodules. Musculoskeletal: Full ROM  all peripheral extremities, joint stability, 5/5 strength, and normal gait. Skin: Warm and dry without rashes, lesions, cyanosis, clubbing or  ecchymosis.  Neuro: Cranial nerves intact, reflexes equal bilaterally. Normal muscle tone, no cerebellar symptoms. Sensation intact.  Pysch: Awake and oriented X 3 with normal affect, insight and judgment appropriate.   Assessment and Plan  1. Essential hypertension  - Microalbumin / creatinine urine ratio - EKG 12-Lead - TSH  2. Hyperlipidemia  - Lipid panel  3. Prediabetes  - Hemoglobin A1c - Insulin, fasting  4. Vitamin D deficiency  - Vit D  25 hydroxy (rtn osteoporosis monitoring)  5. Testosterone deficiency   6. OSA on CPAP   7. Screening for rectal cancer  - POC Hemoccult Bld/Stl (3-Cd Home Screen); Future  8. Prostate cancer  screening  - PSA  9. VD (venereal disease) screening  - HIV antibody - RPR  10. Abnormal LFTs  - Hepatitis A antibody, total - Hepatitis B core antibody, total - Hepatitis B e antibody - Hepatitis B surface antibody - Hepatitis C antibody  11. Other fatigue  - Vitamin B12 - Testosterone - Iron and TIBC  12. Screening examination for pulmonary tuberculosis  - PPD  13. Medication management  - Urine Microscopic - CBC with Differential - BASIC METABOLIC PANEL WITH GFR - Hepatic function panel - Magnesium   Continue prudent diet as discussed, weight control, BP monitoring, regular exercise, and medications as discussed.  Discussed med effects and SE's. Routine screening labs and tests as requested with regular follow-up as recommended. ROV 3 mo.

## 2015-01-17 LAB — CBC WITH DIFFERENTIAL/PLATELET
Basophils Absolute: 0.1 10*3/uL (ref 0.0–0.1)
Basophils Relative: 1 % (ref 0–1)
Eosinophils Absolute: 0.2 10*3/uL (ref 0.0–0.7)
Eosinophils Relative: 4 % (ref 0–5)
HCT: 45 % (ref 39.0–52.0)
Hemoglobin: 16 g/dL (ref 13.0–17.0)
Lymphocytes Relative: 29 % (ref 12–46)
Lymphs Abs: 1.6 10*3/uL (ref 0.7–4.0)
MCH: 31.9 pg (ref 26.0–34.0)
MCHC: 35.6 g/dL (ref 30.0–36.0)
MCV: 89.6 fL (ref 78.0–100.0)
MPV: 11.2 fL (ref 8.6–12.4)
Monocytes Absolute: 0.6 10*3/uL (ref 0.1–1.0)
Monocytes Relative: 11 % (ref 3–12)
Neutro Abs: 3.1 10*3/uL (ref 1.7–7.7)
Neutrophils Relative %: 55 % (ref 43–77)
RBC: 5.02 MIL/uL (ref 4.22–5.81)
RDW: 13.5 % (ref 11.5–15.5)
WBC: 5.6 10*3/uL (ref 4.0–10.5)

## 2015-01-17 LAB — URINALYSIS, MICROSCOPIC ONLY
Bacteria, UA: NONE SEEN
Casts: NONE SEEN
Crystals: NONE SEEN
Squamous Epithelial / LPF: NONE SEEN

## 2015-01-17 LAB — LIPID PANEL
Cholesterol: 211 mg/dL — ABNORMAL HIGH (ref 0–200)
HDL: 40 mg/dL (ref >39)
LDL Cholesterol: 135 mg/dL — ABNORMAL HIGH (ref 0–99)
Total CHOL/HDL Ratio: 5.3 Ratio
Triglycerides: 181 mg/dL — ABNORMAL HIGH (ref <150)
VLDL: 36 mg/dL (ref 0–40)

## 2015-01-17 LAB — INSULIN, FASTING: Insulin fasting, serum: 11.5 u[IU]/mL (ref 2.0–19.6)

## 2015-01-17 LAB — MICROALBUMIN / CREATININE URINE RATIO
Creatinine, Urine: 170.4 mg/dL
Microalb Creat Ratio: 2.3 mg/g (ref 0.0–30.0)
Microalb, Ur: 0.4 mg/dL (ref <2.0)

## 2015-01-17 LAB — IRON AND TIBC
%SAT: 35 % (ref 20–55)
Iron: 119 ug/dL (ref 42–165)
TIBC: 337 ug/dL (ref 215–435)
UIBC: 218 ug/dL (ref 125–400)

## 2015-01-17 LAB — BASIC METABOLIC PANEL WITH GFR
BUN: 9 mg/dL (ref 6–23)
CO2: 28 mEq/L (ref 19–32)
Calcium: 9.7 mg/dL (ref 8.4–10.5)
Chloride: 104 mEq/L (ref 96–112)
Creat: 1.08 mg/dL (ref 0.50–1.35)
GFR, Est African American: >89 mL/min
GFR, Est Non African American: 82 mL/min
Glucose, Bld: 73 mg/dL (ref 70–99)
Potassium: 4.1 mEq/L (ref 3.5–5.3)
Sodium: 140 mEq/L (ref 135–145)

## 2015-01-17 LAB — HEPATIC FUNCTION PANEL
ALT: 32 U/L (ref 0–53)
AST: 24 U/L (ref 0–37)
Albumin: 4.5 g/dL (ref 3.5–5.2)
Alkaline Phosphatase: 60 U/L (ref 39–117)
Bilirubin, Direct: 0.1 mg/dL (ref 0.0–0.3)
Indirect Bilirubin: 0.6 mg/dL (ref 0.2–1.2)
Total Bilirubin: 0.7 mg/dL (ref 0.2–1.2)
Total Protein: 6.9 g/dL (ref 6.0–8.3)

## 2015-01-17 LAB — VITAMIN B12: Vitamin B-12: 432 pg/mL (ref 211–911)

## 2015-01-17 LAB — MAGNESIUM: Magnesium: 2.1 mg/dL (ref 1.5–2.5)

## 2015-01-17 LAB — HEPATITIS C ANTIBODY: HCV Ab: NEGATIVE

## 2015-01-17 LAB — HIV ANTIBODY (ROUTINE TESTING W REFLEX): HIV 1&2 Ab, 4th Generation: NONREACTIVE

## 2015-01-17 LAB — TSH: TSH: 1.592 u[IU]/mL (ref 0.350–4.500)

## 2015-01-17 LAB — VITAMIN D 25 HYDROXY (VIT D DEFICIENCY, FRACTURES): Vit D, 25-Hydroxy: 29 ng/mL — ABNORMAL LOW (ref 30–100)

## 2015-01-17 LAB — RPR

## 2015-01-17 LAB — HEPATITIS B CORE ANTIBODY, TOTAL: Hep B Core Total Ab: NONREACTIVE

## 2015-01-17 LAB — PSA: PSA: 0.53 ng/mL (ref <=4.00)

## 2015-01-17 LAB — HEPATITIS B SURFACE ANTIBODY,QUALITATIVE: Hep B S Ab: NEGATIVE

## 2015-01-17 LAB — HEPATITIS A ANTIBODY, TOTAL: Hep A Total Ab: NONREACTIVE

## 2015-01-17 LAB — TESTOSTERONE: Testosterone: 328 ng/dL (ref 300–890)

## 2015-01-18 LAB — HEPATITIS B E ANTIBODY: Hepatitis Be Antibody: NONREACTIVE

## 2015-01-22 ENCOUNTER — Other Ambulatory Visit: Payer: Self-pay | Admitting: *Deleted

## 2015-01-22 LAB — TB SKIN TEST
Induration: 0 mm
TB Skin Test: NEGATIVE

## 2015-01-22 MED ORDER — ATORVASTATIN CALCIUM 80 MG PO TABS: 80.0000 mg | ORAL_TABLET | Freq: Every day | ORAL | Status: DC

## 2015-03-03 ENCOUNTER — Other Ambulatory Visit: Payer: Self-pay | Admitting: Internal Medicine

## 2015-03-03 DIAGNOSIS — F411 Generalized anxiety disorder: Secondary | ICD-10-CM

## 2015-03-03 MED ORDER — ALPRAZOLAM ER 2 MG PO TB24: 2.0000 mg | ORAL_TABLET | Freq: Every day | ORAL | Status: DC

## 2015-03-22 ENCOUNTER — Other Ambulatory Visit: Payer: Self-pay | Admitting: Internal Medicine

## 2015-03-29 ENCOUNTER — Other Ambulatory Visit: Payer: Self-pay | Admitting: Emergency Medicine

## 2015-04-04 ENCOUNTER — Ambulatory Visit (INDEPENDENT_AMBULATORY_CARE_PROVIDER_SITE_OTHER): Payer: 59 | Admitting: Internal Medicine

## 2015-04-04 ENCOUNTER — Encounter: Payer: Self-pay | Admitting: Internal Medicine

## 2015-04-04 VITALS — BP 126/88 | HR 76 | Temp 98.2°F | Resp 18 | Ht 70.5 in | Wt 250.0 lb

## 2015-04-04 DIAGNOSIS — J0140 Acute pansinusitis, unspecified: Secondary | ICD-10-CM

## 2015-04-04 MED ORDER — AZITHROMYCIN 250 MG PO TABS: ORAL_TABLET | ORAL | Status: DC

## 2015-04-04 MED ORDER — MOMETASONE FUROATE 50 MCG/ACT NA SUSP: 2.0000 | Freq: Every day | NASAL | Status: DC

## 2015-04-04 NOTE — Patient Instructions (Signed)

## 2015-04-04 NOTE — Progress Notes (Signed)
Patient ID: Shane Saunders, male   DOB: 12-Jan-1971, 44 y.o.   MRN: [id number]  HPI  Patient presents to the office for evaluation of cough.  It has been going on for 2 weeks.  Patient reports mild cough that is worse at night > day, dry.  They also endorse change in voice, postnasal drip and sinus pressure, congestion, sinus headaches, ear pain.  .  They have tried antihistamines or OTC sinus relief.  They report that nothing has worked.  They denies other sick contacts.  He does report bad allergies seasonally.    Review of Systems  Constitutional: Positive for malaise/fatigue. Negative for fever and chills.  HENT: Positive for congestion. Negative for ear discharge, ear pain, nosebleeds and sore throat.   Eyes: Positive for discharge (itchy and watery).  Respiratory: Positive for cough. Negative for sputum production, shortness of breath and wheezing.   Cardiovascular: Negative for chest pain.  Skin: Negative.   Neurological: Positive for headaches.    PE:  General:  Alert and non-toxic, WDWN, NAD HEENT: NCAT, PERLA, EOM normal, no occular discharge or erythema.  Nasal mucosal edema with sinus tenderness to palpation.  Oropharynx clear with minimal oropharyngeal edema and erythema.  Mucous membranes moist and pink. Neck:  Cervical adenopathy Chest:  RRR no MRGs.  Lungs clear to auscultation A&P with no wheezes rhonchi or rales.   Abdomen: +BS x 4 quadrants, soft, non-tender, no guarding, rigidity, or rebound. Skin: warm and dry no rash Neuro: A&Ox4, CN II-XII grossly intact  Assessment and Plan:   1. Acute pansinusitis, recurrence not specified Patient declines prednisone due to intolerance.  Likely viral vs. Bacterial sinusitis.  Lungs clear.  Vitals stable.  Treat with daily antihistamine, nasal saline, mucinex, and meds listed below.  Patient to follow-up prn for worsening symptoms or no improvement.  - azithromycin (ZITHROMAX Z-PAK) 250 MG tablet; 2 po day one, then 1 daily x 4  days  Dispense: 5 tablet; Refill: 0 - mometasone (NASONEX) 50 MCG/ACT nasal spray; Place 2 sprays into the nose daily.  Dispense: 17 g; Refill: 2

## 2015-04-10 ENCOUNTER — Encounter: Payer: Self-pay | Admitting: Internal Medicine

## 2015-04-10 ENCOUNTER — Other Ambulatory Visit: Payer: Self-pay | Admitting: Internal Medicine

## 2015-04-10 ENCOUNTER — Ambulatory Visit (INDEPENDENT_AMBULATORY_CARE_PROVIDER_SITE_OTHER): Payer: 59 | Admitting: Internal Medicine

## 2015-04-10 VITALS — BP 128/78 | HR 80 | Temp 97.7°F | Resp 16 | Ht 70.5 in | Wt 250.0 lb

## 2015-04-10 DIAGNOSIS — J309 Allergic rhinitis, unspecified: Secondary | ICD-10-CM

## 2015-04-10 DIAGNOSIS — F4323 Adjustment disorder with mixed anxiety and depressed mood: Secondary | ICD-10-CM

## 2015-04-10 MED ORDER — ALPRAZOLAM ER 1 MG PO TB24: 1.0000 mg | ORAL_TABLET | Freq: Every day | ORAL | Status: DC

## 2015-04-10 MED ORDER — MONTELUKAST SODIUM 10 MG PO TABS
10.0000 mg | ORAL_TABLET | Freq: Every day | ORAL | Status: DC
Start: 2015-04-10 — End: 2015-07-12

## 2015-04-10 MED ORDER — DEXAMETHASONE SODIUM PHOSPHATE 100 MG/10ML IJ SOLN
10.0000 mg | Freq: Once | INTRAMUSCULAR | Status: AC
  Administered 2015-04-10: 10 mg via INTRAMUSCULAR

## 2015-04-10 MED ORDER — DEXAMETHASONE SODIUM PHOSPHATE 10 MG/ML IJ SOLN: 10.0000 mg | Freq: Once | INTRAMUSCULAR | Status: DC

## 2015-04-10 NOTE — Progress Notes (Signed)
   Subjective:    Patient ID: Shane Saunders, male    DOB: [birth year], 44 y.o.   MRN: [id number]  Sinus Problem Associated symptoms include coughing, sneezing and a sore throat. Pertinent negatives include no chills, congestion or shortness of breath.   Patient returns for URI symptoms.  He feels that he is having sinus drainage, having to clear his throat and mild dry cough.  He took a zpak which improved his symptoms.     Review of Systems  Constitutional: Negative for fever, chills and fatigue.  HENT: Positive for postnasal drip, rhinorrhea, sneezing and sore throat. Negative for congestion and trouble swallowing.   Respiratory: Positive for cough. Negative for chest tightness, shortness of breath and wheezing.   Cardiovascular: Negative for chest pain.       Objective:   Physical Exam  Constitutional: He is oriented to person, place, and time. He appears well-developed and well-nourished. No distress.  HENT:  Head: Normocephalic and atraumatic.  Nose: Mucosal edema present.  Mouth/Throat: Uvula is midline, oropharynx is clear and moist and mucous membranes are normal. No trismus in the jaw. No oropharyngeal exudate.  Eyes: Conjunctivae and EOM are normal. Pupils are equal, round, and reactive to light. No scleral icterus.  Neck: Normal range of motion. Neck supple. No JVD present. No thyromegaly present.  Cardiovascular: Normal rate, regular rhythm, normal heart sounds and intact distal pulses.  Exam reveals no gallop and no friction rub.   No murmur heard. Pulmonary/Chest: Effort normal and breath sounds normal. No respiratory distress. He has no wheezes. He has no rales. He exhibits no tenderness.  Musculoskeletal: Normal range of motion.  Lymphadenopathy:    He has no cervical adenopathy.  Neurological: He is alert and oriented to person, place, and time.  Skin: Skin is warm and dry. He is not diaphoretic.  Psychiatric: He has a normal mood and affect. His behavior is  normal. Judgment and thought content normal.  Nursing note and vitals reviewed.         Assessment & Plan:   1. Allergic rhinitis, unspecified allergic rhinitis type  - montelukast (SINGULAIR) 10 MG tablet; Take 1 tablet (10 mg total) by mouth at bedtime.  Dispense: 30 tablet; Refill: 2 - dexamethasone (DECADRON) injection 10 mg; Inject 1 mL (10 mg total) into the muscle once.  2. Adjustment disorder with mixed anxiety and depressed mood -decreased dose - ALPRAZolam (XANAX XR) 1 MG 24 hr tablet; Take 1 tablet (1 mg total) by mouth daily.  Dispense: 30 tablet; Refill: 0

## 2015-04-10 NOTE — Patient Instructions (Signed)

## 2015-05-31 ENCOUNTER — Other Ambulatory Visit: Payer: Self-pay | Admitting: Internal Medicine

## 2015-06-15 ENCOUNTER — Ambulatory Visit (INDEPENDENT_AMBULATORY_CARE_PROVIDER_SITE_OTHER): Payer: 59 | Admitting: Internal Medicine

## 2015-06-15 ENCOUNTER — Other Ambulatory Visit: Payer: Self-pay | Admitting: Internal Medicine

## 2015-06-15 ENCOUNTER — Encounter: Payer: Self-pay | Admitting: Internal Medicine

## 2015-06-15 VITALS — BP 134/86 | HR 64 | Temp 97.9°F | Resp 16 | Ht 70.0 in | Wt 262.0 lb

## 2015-06-15 DIAGNOSIS — Z79899 Other long term (current) drug therapy: Secondary | ICD-10-CM

## 2015-06-15 DIAGNOSIS — E785 Hyperlipidemia, unspecified: Secondary | ICD-10-CM

## 2015-06-15 DIAGNOSIS — G4733 Obstructive sleep apnea (adult) (pediatric): Secondary | ICD-10-CM

## 2015-06-15 DIAGNOSIS — E291 Testicular hypofunction: Secondary | ICD-10-CM

## 2015-06-15 DIAGNOSIS — E559 Vitamin D deficiency, unspecified: Secondary | ICD-10-CM

## 2015-06-15 DIAGNOSIS — E669 Obesity, unspecified: Secondary | ICD-10-CM

## 2015-06-15 DIAGNOSIS — I10 Essential (primary) hypertension: Secondary | ICD-10-CM

## 2015-06-15 DIAGNOSIS — R7309 Other abnormal glucose: Secondary | ICD-10-CM

## 2015-06-15 DIAGNOSIS — Z6837 Body mass index (BMI) 37.0-37.9, adult: Secondary | ICD-10-CM

## 2015-06-15 DIAGNOSIS — R7303 Prediabetes: Secondary | ICD-10-CM

## 2015-06-15 DIAGNOSIS — F909 Attention-deficit hyperactivity disorder, unspecified type: Secondary | ICD-10-CM

## 2015-06-15 DIAGNOSIS — F988 Other specified behavioral and emotional disorders with onset usually occurring in childhood and adolescence: Secondary | ICD-10-CM

## 2015-06-15 DIAGNOSIS — Z9989 Dependence on other enabling machines and devices: Secondary | ICD-10-CM

## 2015-06-15 LAB — HEPATIC FUNCTION PANEL
ALT: 35 U/L (ref 0–53)
AST: 20 U/L (ref 0–37)
Albumin: 4.2 g/dL (ref 3.5–5.2)
Alkaline Phosphatase: 64 U/L (ref 39–117)
Bilirubin, Direct: 0.1 mg/dL (ref 0.0–0.3)
Indirect Bilirubin: 0.5 mg/dL (ref 0.2–1.2)
Total Bilirubin: 0.6 mg/dL (ref 0.2–1.2)
Total Protein: 6.5 g/dL (ref 6.0–8.3)

## 2015-06-15 LAB — HEMOGLOBIN A1C
Hgb A1c MFr Bld: 5.8 % — ABNORMAL HIGH (ref <5.7)
Mean Plasma Glucose: 120 mg/dL — ABNORMAL HIGH (ref <117)

## 2015-06-15 LAB — LIPID PANEL
Cholesterol: 173 mg/dL (ref 0–200)
HDL: 35 mg/dL — ABNORMAL LOW (ref >=40)
LDL Cholesterol: 100 mg/dL — ABNORMAL HIGH (ref 0–99)
Total CHOL/HDL Ratio: 4.9 Ratio
Triglycerides: 192 mg/dL — ABNORMAL HIGH (ref <150)
VLDL: 38 mg/dL (ref 0–40)

## 2015-06-15 LAB — BASIC METABOLIC PANEL WITH GFR
BUN: 11 mg/dL (ref 6–23)
CO2: 27 mEq/L (ref 19–32)
Calcium: 9.1 mg/dL (ref 8.4–10.5)
Chloride: 103 mEq/L (ref 96–112)
Creat: 1.12 mg/dL (ref 0.50–1.35)
GFR, Est African American: >89 mL/min
GFR, Est Non African American: 79 mL/min
Glucose, Bld: 80 mg/dL (ref 70–99)
Potassium: 4.3 mEq/L (ref 3.5–5.3)
Sodium: 141 mEq/L (ref 135–145)

## 2015-06-15 LAB — MAGNESIUM: Magnesium: 2.1 mg/dL (ref 1.5–2.5)

## 2015-06-15 LAB — TSH: TSH: 1.956 u[IU]/mL (ref 0.350–4.500)

## 2015-06-15 MED ORDER — BUPROPION HCL ER (XL) 150 MG PO TB24: ORAL_TABLET | ORAL | Status: DC

## 2015-06-15 NOTE — Progress Notes (Addendum)
Patient ID: EUFEMIO STFLEUR, male   DOB: Apr 30, 1971, 44 y.o.   MRN: [id number]  E R R O R      -      D U P L I C A T E     V I S I T

## 2015-06-15 NOTE — Patient Instructions (Signed)

## 2015-06-15 NOTE — Progress Notes (Signed)
Patient ID: Shane Saunders, male   DOB: [birth year], 44 y.o.   MRN: [id number]   This very nice 43 y.o. MWM presents for 3 month follow up with Hypertension, Hyperlipidemia, Pre-Diabetes,Testosterone  and Vitamin D Deficiency. In June patient had an injury and near loss of the right arm with salvage, but some persistent weakness of the Rt arm & hand. He was out of work for an extended period and has since returned to work. He had been on a SSRI for anxiety, mood /irritability and developed problems with ED/anorgasmia and began to fell mentally dulled and thence self d/c'dmost all of his meds about 3 weeks ago including his Atorvastatin,Vit D, Citalopram, Mometasone and and montelukast. During this period he has developed sx's of  tinnitus, hot flashes, irritability and confusion with mild anomia or difficulty expressing his thoughts.    Patient is treated for HTN since 2010 & BP has been controlled at home. Today's BP: 134/86 mmHg. Patient has had no complaints of any cardiac type chest pain, palpitations, dyspnea/orthopnea/PND, dizziness, claudication, or dependent edema.   Hyperlipidemia is controlled with diet & meds. Patient denies myalgias or other med SE's. Last Lipids were  Not at goal -Chol 211; HDL 40; LDL  135*; Trig 181 on 01/16/2015.   Also, the patient has history of Morbid Obesity (BMI 37.59) and consequent PreDiabetes and has had no symptoms of reactive hypoglycemia, diabetic polys, paresthesias or visual blurring.  Last A1c was  5.4% on 01/16/2015.    Patient has low Testosterone since Oct 2011 with a low level of 142 and had been on replacement therapy which he self d/c'd.  Further, the patient also has history of Vitamin D Deficiency  Of 33 in 2008 and has supplemented vitamin D sporadically without any suspected side-effects. Last vitamin D was 29 on 01/16/2015.     Medication Sig  . ALPRAZolam XR 2 MG 24 hr tablet Take 2 mg  daily. --  1/4-1/2 tablet 1 - 2 x /day  . atorvastatin  80 MG   TAKE 1 TAB EVERY DAY -- not taking  . VITAMIN D) 2000 UNITS CAPS Take 8,000 Units  daily -- not taking  . citalopram  40 MG tablet TAKE 1 & 1/2 TAB daily FOR MOOD --not taking  . mometasone   nasal spray Place 2 sprays into the nose daily -- not taking  . montelukast  10 MG tablet Take 1 tablet  at bedtime -- not taking   Allergies  Allergen Reactions  . Codeine Other (See Comments)    unknown  . Effexor [Venlafaxine]   . Pravastatin   . Prednisone     Decrease sleep  . Tramadol Hcl Itching  . Zinc    PMHx:   Past Medical History  Diagnosis Date  . Hypertension   . Hyperlipidemia   . Prediabetes   . Obesity (BMI 30-39.9)   . Hypogonadism male   . Vitamin D deficiency   . Anemia   . High cholesterol   . OSA on CPAP   . Anxiety   . H/O hiatal hernia   . Arthritis     "knees" (05/31/2014)  . Depression    Immunization History  Administered Date(s) Administered  . Influenza-Unspecified 10/20/2014  . PPD Test 01/16/2015  . Pneumococcal-Unspecified 10/27/2006  . Tdap 08/05/2013, 05/31/2014   Past Surgical History  Procedure Laterality Date  . Vasectomy    . Abdominal exploration surgery  1998    Celiotomy  . Appendectomy  1998  during exploratory lap  . Arm wound repair / closure Right 05/31/2014    irrigation and exploration/notes 05/31/2014  . Abdominal exploration surgery  1990's    "appendix was on the wrong side; colon twisted"  . Appendectomy  1990's  . Colon surgery  1990's    "colon twisted and put back in place; didn't take any of it out"  . Artery repair Right 05/31/2014    Procedure: IRRIGATION, EXPLORATION, AND REPAIR OF RIGHT ARM WOUND;  Surgeon: Cherylynn Ridges, MD;  Location: [facility] OR;  Service: General;  Laterality: Right;   FHx:    Reviewed / unchanged  SHx:    Reviewed / unchanged  Systems Review:  Constitutional: Denies fever, chills, wt changes, headaches, insomnia, fatigue, night sweats, change in appetite. Eyes: Denies redness, blurred vision,  diplopia, discharge, itchy, watery eyes.  ENT: Denies discharge, congestion, post nasal drip, epistaxis, sore throat, earache, hearing loss, dental pain, tinnitus, vertigo, sinus pain, snoring.  CV: Denies chest pain, palpitations, irregular heartbeat, syncope, dyspnea, diaphoresis, orthopnea, PND, claudication or edema. Respiratory: denies cough, dyspnea, DOE, pleurisy, hoarseness, laryngitis, wheezing.  Gastrointestinal: Denies dysphagia, odynophagia, heartburn, reflux, water brash, abdominal pain or cramps, nausea, vomiting, bloating, diarrhea, constipation, hematemesis, melena, hematochezia  or hemorrhoids. Genitourinary: Denies dysuria, frequency, urgency, nocturia, hesitancy, discharge, hematuria or flank pain. Musculoskeletal: Denies arthralgias, myalgias, stiffness, jt. swelling, pain, limping or strain/sprain.  Skin: Denies pruritus, rash, hives, warts, acne, eczema or change in skin lesion(s). Neuro: No weakness, tremor, incoordination, spasms, paresthesia or pain. Psychiatric: Denies confusion, memory loss or sensory loss. Endo: Denies change in weight, skin or hair change.  Heme/Lymph: No excessive bleeding, bruising or enlarged lymph nodes.  Physical Exam  BP 134/86   Pulse 64  Temp 97.9 F   Resp 16  Ht 5\' 10"    Wt 262 lb     BMI 37.59   Appears well nourished and in no distress. Eyes: PERRLA, EOMs, conjunctiva no swelling or erythema. Sinuses: No frontal/maxillary tenderness ENT/Mouth: EAC's clear, TM's nl w/o erythema, bulging. Nares clear w/o erythema, swelling, exudates. Oropharynx clear without erythema or exudates. Oral hygiene is good. Tongue normal, non obstructing. Hearing intact.  Neck: Supple. Thyroid nl. Car 2+/2+ without bruits, nodes or JVD. Chest: Respirations nl with BS clear & equal w/o rales, rhonchi, wheezing or stridor.  Cor: Heart sounds normal w/ regular rate and rhythm without sig. murmurs, gallops, clicks, or rubs. Peripheral pulses normal and equal   without edema.  Abdomen: Soft & bowel sounds normal. Non-tender w/o guarding, rebound, hernias, masses, or organomegaly.  Lymphatics: Unremarkable.  Musculoskeletal: Full ROM all peripheral extremities, joint stability, 5/5 strength, and normal gait.  Skin: Warm, dry without exposed rashes, lesions or ecchymosis apparent.  Neuro: Cranial nerves intact, reflexes equal bilaterally. Sensory-motor testing grossly intact. Tendon reflexes grossly intact.  Pysch: Alert & oriented x 3.  Affect is flat. No ideations. seems to have some difficulty expressing his thoughts.   Assessment and Plan:  1. Essential hypertension  - TSH  2. Hyperlipidemia  - Lipid panel  3. Prediabetes  - Hemoglobin A1c - Insulin, random  4. Vitamin D deficiency  - Vit D  25 hydroxy (rtn osteoporosis monitoring); Future  5. ADD (attention deficit disorder)  - Suspect he may still be having some"withdrawal" from the citalopram - buPROPion (WELLBUTRIN XL) 150 MG 24 hr tablet; Take 1 tablet everty morning for focusing & concentrating  Dispense: 30 tablet; Refill: 1 - ROV 1 month too re -evaluate for possible dosing  escalation.  6. Testosterone deficiency  - Testosterone  7. Obesity (BMI 35.47)   8. Medication management  - CBC with Differential/Platelet - BASIC METABOLIC PANEL WITH GFR - Hepatic function panel - Magnesium   Recommended regular exercise, BP monitoring, weight control, and discussed med and SE's. Recommended labs to assess and monitor clinical status. Further disposition pending results of labs. Over 30 minutes of exam, counseling, chart review was performed

## 2015-06-16 LAB — CBC WITH DIFFERENTIAL/PLATELET
Basophils Absolute: 0.1 10*3/uL (ref 0.0–0.1)
Basophils Relative: 1 % (ref 0–1)
Eosinophils Absolute: 0.4 10*3/uL (ref 0.0–0.7)
Eosinophils Relative: 7 % — ABNORMAL HIGH (ref 0–5)
HCT: 44.5 % (ref 39.0–52.0)
Hemoglobin: 15.9 g/dL (ref 13.0–17.0)
Lymphocytes Relative: 29 % (ref 12–46)
Lymphs Abs: 1.7 10*3/uL (ref 0.7–4.0)
MCH: 31.8 pg (ref 26.0–34.0)
MCHC: 35.7 g/dL (ref 30.0–36.0)
MCV: 89 fL (ref 78.0–100.0)
MPV: 11.3 fL (ref 8.6–12.4)
Monocytes Absolute: 0.8 10*3/uL (ref 0.1–1.0)
Monocytes Relative: 13 % — ABNORMAL HIGH (ref 3–12)
Neutro Abs: 3 10*3/uL (ref 1.7–7.7)
Neutrophils Relative %: 50 % (ref 43–77)
RBC: 5 MIL/uL (ref 4.22–5.81)
RDW: 13.5 % (ref 11.5–15.5)
WBC: 5.9 10*3/uL (ref 4.0–10.5)

## 2015-06-16 LAB — INSULIN, RANDOM: Insulin: 40.2 u[IU]/mL — ABNORMAL HIGH (ref 2.0–19.6)

## 2015-06-16 LAB — TESTOSTERONE: Testosterone: 308 ng/dL (ref 300–890)

## 2015-07-03 ENCOUNTER — Other Ambulatory Visit: Payer: Self-pay | Admitting: Internal Medicine

## 2015-07-12 ENCOUNTER — Ambulatory Visit (INDEPENDENT_AMBULATORY_CARE_PROVIDER_SITE_OTHER): Payer: 59 | Admitting: Physician Assistant

## 2015-07-12 ENCOUNTER — Encounter: Payer: Self-pay | Admitting: Internal Medicine

## 2015-07-12 VITALS — BP 122/72 | HR 84 | Temp 97.3°F | Resp 16 | Ht 70.0 in | Wt 262.0 lb

## 2015-07-12 DIAGNOSIS — M25562 Pain in left knee: Secondary | ICD-10-CM

## 2015-07-12 DIAGNOSIS — R413 Other amnesia: Secondary | ICD-10-CM

## 2015-07-12 DIAGNOSIS — G4733 Obstructive sleep apnea (adult) (pediatric): Secondary | ICD-10-CM

## 2015-07-12 DIAGNOSIS — F324 Major depressive disorder, single episode, in partial remission: Secondary | ICD-10-CM

## 2015-07-12 DIAGNOSIS — F418 Other specified anxiety disorders: Secondary | ICD-10-CM

## 2015-07-12 DIAGNOSIS — M791 Myalgia, unspecified site: Secondary | ICD-10-CM

## 2015-07-12 DIAGNOSIS — Z9989 Dependence on other enabling machines and devices: Principal | ICD-10-CM

## 2015-07-12 DIAGNOSIS — M25561 Pain in right knee: Secondary | ICD-10-CM

## 2015-07-12 HISTORY — DX: Major depressive disorder, single episode, in partial remission: F32.4

## 2015-07-12 MED ORDER — MELOXICAM 15 MG PO TABS: ORAL_TABLET | ORAL | Status: DC

## 2015-07-12 MED ORDER — BUPROPION HCL ER (XL) 300 MG PO TB24
300.0000 mg | ORAL_TABLET | ORAL | Status: DC
Start: 2015-07-12 — End: 2015-10-09

## 2015-07-12 NOTE — Addendum Note (Signed)
Addended by: Lucky CowboyMCKEOWN, Sholonda Jobst on: 07/12/2015 03:19 PM   Modules accepted: Level of Service

## 2015-07-12 NOTE — Patient Instructions (Signed)
Can call Dr. Toni Arthurs to get evaluated for dental sleep appliance. # [phone]  OR you can try Dr. Reatha Armour in [facility] # [phone]  We will send notes. Call and get price quote on both.   .I think it is possible that you have sleep apnea. It can cause interrupted sleep, headaches, frequent awakenings, fatigue, dry mouth, fast/slow heart beats, memory issues, anxiety/depression, swelling, numbness tingling hands/feet, weight gain, shortness of breath, and the list goes on. Sleep apnea needs to be ruled out because if it is left untreated it does eventually lead to abnormal heart beats, lung failure or heart failure as well as increasing the risk of heart attack and stroke. There are masks you can wear OR a mouth piece that I can give you information about. Often times though people feel MUCH better after getting treatment.   Sleep Apnea  Sleep apnea is a sleep disorder characterized by abnormal pauses in breathing while you sleep. When your breathing pauses, the level of oxygen in your blood decreases. This causes you to move out of deep sleep and into light sleep. As a result, your quality of sleep is poor, and the system that carries your blood throughout your body (cardiovascular system) experiences stress. If sleep apnea remains untreated, the following conditions can develop:  High blood pressure (hypertension).  Coronary artery disease.  Inability to achieve or maintain an erection (impotence).  Impairment of your thought process (cognitive dysfunction). There are three types of sleep apnea: 1. Obstructive sleep apnea--Pauses in breathing during sleep because of a blocked airway. 2. Central sleep apnea--Pauses in breathing during sleep because the area of the brain that controls your breathing does not send the correct signals to the muscles that control breathing. 3. Mixed sleep apnea--A combination of both obstructive and central sleep apnea.  RISK FACTORS The following  risk factors can increase your risk of developing sleep apnea:  Being overweight.  Smoking.  Having narrow passages in your nose and throat.  Being of older age.  Being male.  Alcohol use.  Sedative and tranquilizer use.  Ethnicity. Among individuals younger than 35 years, African Americans are at increased risk of sleep apnea. SYMPTOMS   Difficulty staying asleep.  Daytime sleepiness and fatigue.  Loss of energy.  Irritability.  Loud, heavy snoring.  Morning headaches.  Trouble concentrating.  Forgetfulness.  Decreased interest in sex. DIAGNOSIS  In order to diagnose sleep apnea, your caregiver will perform a physical examination. Your caregiver may suggest that you take a home sleep test. Your caregiver may also recommend that you spend the night in a sleep lab. In the sleep lab, several monitors record information about your heart, lungs, and brain while you sleep. Your leg and arm movements and blood oxygen level are also recorded. TREATMENT The following actions may help to resolve mild sleep apnea:  Sleeping on your side.   Using a decongestant if you have nasal congestion.   Avoiding the use of depressants, including alcohol, sedatives, and narcotics.   Losing weight and modifying your diet if you are overweight. There also are devices and treatments to help open your airway:  Oral appliances. These are custom-made mouthpieces that shift your lower jaw forward and slightly open your bite. This opens your airway.  Devices that create positive airway pressure. This positive pressure "splints" your airway open to help you breathe better during sleep. The following devices create positive airway pressure:  Continuous positive airway pressure (CPAP) device. The  CPAP device creates a continuous level of air pressure with an air pump. The air is delivered to your airway through a mask while you sleep. This continuous pressure keeps your airway open.  Nasal  expiratory positive airway pressure (EPAP) device. The EPAP device creates positive air pressure as you exhale. The device consists of single-use valves, which are inserted into each nostril and held in place by adhesive. The valves create very little resistance when you inhale but create much more resistance when you exhale. That increased resistance creates the positive airway pressure. This positive pressure while you exhale keeps your airway open, making it easier to breath when you inhale again.  Bilevel positive airway pressure (BPAP) device. The BPAP device is used mainly in patients with central sleep apnea. This device is similar to the CPAP device because it also uses an air pump to deliver continuous air pressure through a mask. However, with the BPAP machine, the pressure is set at two different levels. The pressure when you exhale is lower than the pressure when you inhale.  Surgery. Typically, surgery is only done if you cannot comply with less invasive treatments or if the less invasive treatments do not improve your condition. Surgery involves removing excess tissue in your airway to create a wider passage way. Document Released: 12/05/2002 Document Revised: 04/11/2013 Document Reviewed: 04/22/2012 [facility] Patient Information 2015 [place], [place]. This information is not intended to replace advice given to you by your health care provider. Make sure you discuss any questions you have with your health care provider.  Muscle aches .  Please make sure you are taking 64 oz of water a day as long as you do now have a heart condition.  -Please take Tylenol or Aleve for pain. -You can take tylenol (500mg ) or tylenol arthritis (650mg ). The max you can take of tylenol a day is 3000mg  daily, this is a max of 6 pills a day of the regular tyelnol (500mg ) or a max of 4 a day of the tylenol arthritis (650mg ) as long as no other medications you are taking contain tylenol.   If you have had tick  exposure, we can check for lyme disease or [place] spotted fever.   Muscle Pain Muscle pain (myalgia) may be caused by many things, including:  Overuse or muscle strain, especially if you are not in shape. This is the most common cause of muscle pain.  Injury.  Bruises.  Viruses, such as the flu.  Infectious diseases.  Fibromyalgia, which is a chronic condition that causes muscle tenderness, fatigue, and headache.  Autoimmune diseases, including lupus.  Certain drugs, including ACE inhibitors and statins. Muscle pain may be mild or severe. In most cases, the pain lasts only a short time and goes away without treatment. To diagnose the cause of your muscle pain, your health care provider will take your medical history. This means he or she will ask you when your muscle pain began and what has been happening. If you have not had muscle pain for very long, your health care provider may want to wait before doing much testing. If your muscle pain has lasted a long time, your health care provider may want to run tests right away. If your health care provider thinks your muscle pain may be caused by illness, you may need to have additional tests to rule out certain conditions.  Treatment for muscle pain depends on the cause. Home care is often enough to relieve muscle pain. Your health care  provider may also prescribe anti-inflammatory medicine. HOME CARE INSTRUCTIONS Watch your condition for any changes. The following actions may help to lessen any discomfort you are feeling:  Only take over-the-counter or prescription medicines as directed by your health care provider.  Apply ice to the sore muscle:  Put ice in a plastic bag.  Place a towel between your skin and the bag.  Leave the ice on for 15-20 minutes, 3-4 times a day.  You may alternate applying hot and cold packs to the muscle as directed by your health care provider.  If overuse is causing your muscle pain, slow down  your activities until the pain goes away.  Remember that it is normal to feel some muscle pain after starting a workout program. Muscles that have not been used often will be sore at first.  Do regular, gentle exercises if you are not usually active.  Warm up before exercising to lower your risk of muscle pain.  Do not continue working out if the pain is very bad. Bad pain could mean you have injured a muscle. SEEK MEDICAL CARE IF:  Your muscle pain gets worse, and medicines do not help.  You have muscle pain that lasts longer than 3 days.  You have a rash or fever along with muscle pain.  You have muscle pain after a tick bite.  You have muscle pain while working out, even though you are in good physical condition.  You have redness, soreness, or swelling along with muscle pain.  You have muscle pain after starting a new medicine or changing the dose of a medicine. SEEK IMMEDIATE MEDICAL CARE IF:  You have trouble breathing.  You have trouble swallowing.  You have muscle pain along with a stiff neck, fever, and vomiting.  You have severe muscle weakness or cannot move part of your body. MAKE SURE YOU:   Understand these instructions.  Will watch your condition.  Will get help right away if you are not doing well or get worse. Document Released: 11/06/2006 Document Revised: 12/20/2013 Document Reviewed: 10/11/2013 [facility] Patient Information 2015 [place], [place]. This information is not intended to replace advice given to you by your health care provider. Make sure you discuss any questions you have with your health care provider.

## 2015-07-12 NOTE — Progress Notes (Signed)
Assessment and Plan: Anxiety/depression- increase wellbutrin to 300mg  XL and follow up 1 month.  Muscle aches- check CPK, lyme, RMSF Memory issues- normal neuro- likely due to depression/anxiety- monitor, given names for dental mouth piece for OSA.    HPI 44 y.o.male presents for follow up from last visit. Last visit he had some depressoin and stopped all of his medications, had some withdrawal from the celexa. He was put on xanax XR 2 mg 1/2 at night and wellbutrin 150mg  XL, he is still having trouble with focusing and states that his temper is still bad.   Feels that he is messing up a lot at work, having an issues. Denies HA, dizziness, double vision. Is suppose to be on CPAP but is not.  He started back on the lipitor, having some myalgias.  Has bilateral knee pain, worse with steps/hills. Went hiking the other day and has been having pain.    Past Medical History  Diagnosis Date  . Hypertension   . Hyperlipidemia   . Prediabetes   . Obesity (BMI 30-39.9)   . Hypogonadism male   . Vitamin D deficiency   . Anemia   . High cholesterol   . OSA on CPAP   . Anxiety   . H/O hiatal hernia   . Arthritis     "knees" (05/31/2014)  . Depression      Allergies  Allergen Reactions  . Codeine Other (See Comments)    unknown  . Effexor [Venlafaxine]   . Pravastatin   . Prednisone     Decrease sleep  . Tramadol Hcl Itching  . Zinc       Current Outpatient Prescriptions on File Prior to Visit  Medication Sig Dispense Refill  . ALPRAZolam (XANAX XR) 2 MG 24 hr tablet TAKE 1 TABLET BY MOUTH EVERY DAY 30 tablet 1  . atorvastatin (LIPITOR) 80 MG tablet TAKE 1 TABLET EVERY DAY 30 tablet 3  . buPROPion (WELLBUTRIN XL) 150 MG 24 hr tablet Take 1 tablet everty morning for focusing & concentrating 30 tablet 1  . Cholecalciferol (VITAMIN D) 2000 UNITS CAPS Take 8,000 Units by mouth daily.     No current facility-administered medications on file prior to visit.    ROS: all negative  except above.   Physical Exam: Filed Weights   07/12/15 1454  Weight: 262 lb (118.842 kg)   BP 122/72 mmHg  Pulse 84  Temp(Src) 97.3 F (36.3 C)  Resp 16  Ht 5\' 10"  (1.778 m)  Wt 262 lb (118.842 kg)  BMI 37.59 kg/m2 General Appearance: Well nourished, in no apparent distress. Eyes: PERRLA, EOMs, conjunctiva no swelling or erythema Sinuses: No Frontal/maxillary tenderness ENT/Mouth: Ext aud canals clear, TMs without erythema, bulging. No erythema, swelling, or exudate on post pharynx.  Tonsils not swollen or erythematous. Hearing normal.  Neck: Supple, thyroid normal.  Respiratory: Respiratory effort normal, BS equal bilaterally without rales, rhonchi, wheezing or stridor.  Cardio: RRR with no MRGs. Brisk peripheral pulses without edema.  Abdomen: Soft, + BS.  Non tender, no guarding, rebound, hernias, masses. Lymphatics: Non tender without lymphadenopathy.  Musculoskeletal: Full ROM, 5/5 strength, normal gait.  Skin: Warm, dry without rashes, lesions, ecchymosis.  Neuro: Cranial nerves intact. Normal muscle tone, no cerebellar symptoms. Sensation intact.  Psych: Awake and oriented X 3, normal affect, Insight and Judgment appropriate.     Quentin MullingAmanda Jeramey Lanuza, PA-C 3:17 PM [facility]

## 2015-07-13 DIAGNOSIS — S5782XA Crushing injury of left forearm, initial encounter: Secondary | ICD-10-CM

## 2015-07-13 DIAGNOSIS — S5702XA Crushing injury of left elbow, initial encounter: Secondary | ICD-10-CM | POA: Insufficient documentation

## 2015-07-13 HISTORY — DX: Crushing injury of left elbow, initial encounter: S57.02XA

## 2015-07-13 LAB — LYME ABY, WSTRN BLT IGG & IGM W/BANDS

## 2015-07-13 LAB — ANA: Anti Nuclear Antibody(ANA): NEGATIVE

## 2015-07-13 LAB — ROCKY MTN SPOTTED FVR ABS PNL(IGG+IGM)
RMSF IgG: 0.19 IV
RMSF IgM: 0.15 IV

## 2015-07-13 LAB — CK: Total CK: 66 U/L (ref 7–232)

## 2015-07-13 LAB — SEDIMENTATION RATE: Sed Rate: 4 mm/hr (ref 0–15)

## 2015-07-19 ENCOUNTER — Ambulatory Visit: Payer: Self-pay | Admitting: Internal Medicine

## 2015-08-14 ENCOUNTER — Ambulatory Visit (INDEPENDENT_AMBULATORY_CARE_PROVIDER_SITE_OTHER): Payer: 59 | Admitting: Internal Medicine

## 2015-08-14 ENCOUNTER — Encounter: Payer: Self-pay | Admitting: Internal Medicine

## 2015-08-14 VITALS — BP 116/82 | HR 72 | Temp 97.5°F | Resp 16 | Ht 70.0 in | Wt 256.2 lb

## 2015-08-14 DIAGNOSIS — F4323 Adjustment disorder with mixed anxiety and depressed mood: Secondary | ICD-10-CM | POA: Diagnosis not present

## 2015-08-14 DIAGNOSIS — I10 Essential (primary) hypertension: Secondary | ICD-10-CM

## 2015-08-14 MED ORDER — ALPRAZOLAM 1 MG PO TABS: ORAL_TABLET | ORAL | Status: DC

## 2015-08-14 NOTE — Patient Instructions (Signed)

## 2015-08-15 ENCOUNTER — Encounter: Payer: Self-pay | Admitting: Internal Medicine

## 2015-08-15 NOTE — Progress Notes (Signed)
   Subjective:    Patient ID: Shane Saunders, male    DOB: 1971-11-01, 44 y.o.   MRN: [id number]  HPI Patient is back to day in F/u of increasing his Wellbutrin for mood, irritability & anger issues & is accompanied by wife today. Both Pt & wife report he is "better", but still not back to Nl. 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 He's looking for other jobs and is applying to [employer] to get some type of degree. Reluctant to take the whole 2 mg XR tab of Xanax for fear of too much sedation, so he's using 1/2 to 1 tab of his wife's Xanax 1 mg. Medication Sig  . atorvastatin (LIPITOR) 80 MG tablet TAKE 1 TABLET EVERY DAY  . buPROPion (WELLBUTRIN XL) 300 MG 24 hr tablet Take 1 tablet (300 mg total) by mouth every morning.  . Cholecalciferol (VITAMIN D) 2000 UNITS CAPS Take 8,000 Units by mouth daily.  . meloxicam (MOBIC) 15 MG tablet Take one daily with food for 2 weeks, can take with tylenol, can not take with aleve, iburpofen, then as needed daily for pain  . ALPRAZolam (XANAX XR) 2 MG 24 hr tablet TAKE 1 TABLET BY MOUTH EVERY DAY   No facility-administered medications prior to visit.   Allergies  Allergen Reactions  . Codeine Other (See Comments)    unknown  . Effexor [Venlafaxine]   . Pravastatin   . Prednisone     Decrease sleep  . Tramadol Hcl Itching  . Zinc    Past Medical History  Diagnosis Date  . Hypertension   . Hyperlipidemia   . Prediabetes   . Obesity (BMI 30-39.9)   . Hypogonadism male   . Vitamin D deficiency   . Anemia   . High cholesterol   . OSA on CPAP   . Anxiety   . H/O hiatal hernia   . Arthritis     "knees" (05/31/2014)  . Depression    Review of Systems 10 point systems review negative except as above.    Objective:   Physical Exam  BP 116/82 mmHg  Pulse 72  Temp(Src) 97.5 F (36.4 C)  Resp 16  Ht  (1.778 m)  Wt 256 lb 3.2 oz (116.212 kg)  BMI 36.76 kg/m2 HEENT - Eac's patent.  TM's Nl. EOM's full. PERRLA. NasoOroPharynx clear. Neck - supple. Nl Thyroid. Carotids 2+ & No bruits, nodes, JVD Chest - Clear equal BS w/o Rales, rhonchi, wheezes. Cor - Nl HS. RRR w/o sig MGR. PP 1(+). No edema. Abd - No palpable organomegaly, masses or tenderness. BS nl. MS- FROM w/o deformities. Muscle power, tone and bulk Nl. Gait Nl. Neuro - No obvious Cr N abnormalities. Sensory, motor and Cerebellar functions appear Nl w/o focal abnormalities. Psyche - Mental status normal & appropriate.  No delusions, ideations or obvious mood abnormalities.    Assessment & Plan:   1. Essential hypertension  2. Adjustment disorder with mixed anxiety and depressed mood  - ALPRAZolam 1 MG tablet; Take 1/2 to 1 tablet up to 3 x day if need for anxiety  Dispense: 90 tablet; Refill: 2  - ROV 1 month -discussed meds

## 2015-08-20 ENCOUNTER — Encounter: Payer: Self-pay | Admitting: Internal Medicine

## 2015-09-13 ENCOUNTER — Encounter: Payer: Self-pay | Admitting: Internal Medicine

## 2015-09-13 ENCOUNTER — Ambulatory Visit (INDEPENDENT_AMBULATORY_CARE_PROVIDER_SITE_OTHER): Payer: 59 | Admitting: Internal Medicine

## 2015-09-13 VITALS — BP 126/84 | HR 72 | Temp 97.3°F | Resp 18 | Ht 70.0 in | Wt 247.2 lb

## 2015-09-13 DIAGNOSIS — R7309 Other abnormal glucose: Secondary | ICD-10-CM

## 2015-09-13 DIAGNOSIS — J209 Acute bronchitis, unspecified: Secondary | ICD-10-CM | POA: Diagnosis not present

## 2015-09-13 DIAGNOSIS — Z6835 Body mass index (BMI) 35.0-35.9, adult: Secondary | ICD-10-CM

## 2015-09-13 DIAGNOSIS — F411 Generalized anxiety disorder: Secondary | ICD-10-CM

## 2015-09-13 DIAGNOSIS — R7303 Prediabetes: Secondary | ICD-10-CM

## 2015-09-13 MED ORDER — CITALOPRAM HYDROBROMIDE 20 MG PO TABS: ORAL_TABLET | ORAL | Status: DC

## 2015-09-13 MED ORDER — PREDNISONE 20 MG PO TABS: ORAL_TABLET | ORAL | Status: DC

## 2015-09-13 MED ORDER — LEVOFLOXACIN 500 MG PO TABS: 500.0000 mg | ORAL_TABLET | Freq: Every day | ORAL | Status: DC

## 2015-09-15 ENCOUNTER — Encounter: Payer: Self-pay | Admitting: Internal Medicine

## 2015-09-15 NOTE — Progress Notes (Signed)
  Subjective:    Patient ID: Shane Saunders, male    DOB: 1971/03/14, 44 y.o.   MRN: [id number]  HPI  Patient returns for f/u of depression and reports feeling some better on Wellbutrin. Reports he is still irritable & is only occasionally using his Alprazolam. Still reports intermittent anxiety. Also was recently treated at an [facility] for a "cold" with Ceftin and Hydrocodone and reports not feeling improved. He's c/o head & chest congestion and yellowish green sinus drainage and sputum . Denies CP, fever, chills or dyspnea.   Medication Sig  . ALPRAZolam (XANAX) 1 MG  Take 1/2 to 1 tablet up to 3 x day if need for anxiety  . atorvastatin (LIPITOR) 80 MG TAKE 1 TABLET EVERY DAY  . buPROPion  XL 300 MG 24 hr  Take 1 tablet (300 mg total) by mouth every morning.  Marland Kitchen VITAMIN D 2000 UNITS CAPS Take 8,000 Units by mouth daily.  . meloxicam (MOBIC) 15 MG tablet Take one daily with food   Allergies  Allergen Reactions  . Codeine Other (See Comments)    unknown  . Effexor [Venlafaxine]   . Pravastatin   . Prednisone     Decrease sleep  . Tramadol Hcl Itching  . Zinc    Past Medical History  Diagnosis Date  . Hypertension   . Hyperlipidemia   . Prediabetes   . Obesity (BMI 30-39.9)   . Hypogonadism male   . Vitamin D deficiency   . OSA on CPAP    Review of Systems 10 point systems review negative except as above.    Objective:   Physical Exam  BP 126/84 m  Pulse 72  Temp 97.3 F   Resp 18  Ht  Wt 247 lb     BMI 35.47   Congested cough w/o stridor.   Skin - clear w/o rash cyanosis, icterus or clubbing. HEENT - Eac's patent. TM's Nl. EOM's full. PERRLA. (+) tender frontal/maxillary areasNasoOroPharynx clear. Neck - supple. Nl Thyroid. Carotids 2+ & No bruits, nodes, JVD Chest - Clear equal BS w/o Rales, rhonchi, wheezes. Cor - Nl HS. RRR w/o sig MGR. PP 1(+). No edema. Abd - No palpable organomegaly, masses or tenderness. BS nl. MS- FROM w/o deformities. Muscle  power, tone and bulk Nl. Gait Nl. Neuro - No obvious Cr N abnormalities. Sensory, motor and Cerebellar functions appear Nl w/o focal abnormalities. Psyche - Mental status normal & appropriate.  No delusions, ideations or obvious mood abnormalities.    Assessment & Plan:   1. Acute bronchitis, unspecified organism  - levofloxacin (LEVAQUIN) 500 MG tablet; Take 1 tablet (500 mg total) by mouth daily.  Dispense: 14 tablet; Refill: 0 - predniSONE (DELTASONE) 20 MG tablet; 1 tab 3 x day for 3 days, then 1 tab 2 x day for 3 days, then 1 tab 1 x day for 5 days  Dispense: 20 tablet; Refill: 0  2. Generalized anxiety disorder  - citalopram (CELEXA) 20 MG tablet; Take 1/2 to 1 tablet daily for mood  Dispense: 30 tablet; Refill: 2

## 2015-10-09 ENCOUNTER — Other Ambulatory Visit: Payer: Self-pay | Admitting: Physician Assistant

## 2015-10-16 ENCOUNTER — Encounter: Payer: Self-pay | Admitting: Internal Medicine

## 2015-10-16 ENCOUNTER — Ambulatory Visit (INDEPENDENT_AMBULATORY_CARE_PROVIDER_SITE_OTHER): Payer: 59 | Admitting: Internal Medicine

## 2015-10-16 VITALS — BP 122/82 | HR 88 | Temp 97.5°F | Resp 16 | Ht 70.0 in | Wt 251.8 lb

## 2015-10-16 DIAGNOSIS — J4520 Mild intermittent asthma, uncomplicated: Secondary | ICD-10-CM

## 2015-10-16 DIAGNOSIS — F4323 Adjustment disorder with mixed anxiety and depressed mood: Secondary | ICD-10-CM

## 2015-10-16 DIAGNOSIS — Z6837 Body mass index (BMI) 37.0-37.9, adult: Secondary | ICD-10-CM | POA: Diagnosis not present

## 2015-10-16 MED ORDER — PREDNISONE 20 MG PO TABS: ORAL_TABLET | ORAL | Status: DC

## 2015-10-16 MED ORDER — FLUTICASONE FUROATE-VILANTEROL 200-25 MCG/INH IN AEPB: 200.0000 ug | INHALATION_SPRAY | Freq: Every morning | RESPIRATORY_TRACT | Status: DC

## 2015-10-16 NOTE — Progress Notes (Signed)
Subjective:    Patient ID: Shane Saunders, male    DOB: 1971-05-18, 44 y.o.   MRN: [id number]  HPI   Shane Saunders returns for 21 mo f/u of adding Citalopram 20 mgm with his Wellbutrin 300 mg and does admit feeling much better, but unfortunately has had reoccurance of anorgasmia on the Citalopram despite using 1/2 the previously offending previous dose. Otherwise medically he is stable and feels that interactions with co-workers, spouse & children is improved.     Also, patient relates that after recent Tx w/Levaquin & prednisone taper that he still contuinues to wheeze, altho his sputum has cleared. He denies dyspnea, fever, chills, rashes or sweats.   Medication Sig  . ALPRAZolam  1 MG tablet Take 1/2 to 1 tablet up to 3 x day if need for anxiety  . atorvastatin  80 MG tablet TAKE 1 TABLET EVERY DAY  . buPROPion - XL 300 MG 24 hr tablet TAKE 1 TABLET BY MOUTH EVERY MORNING  . VITAMIN D 2000 UNITS CAPS Take 8,000 Units by mouth daily.  . citalopram (CELEXA) 20 MG  Take 1/2 to 1 tablet daily for mood  . meloxicam (MOBIC) 15 MG Take one daily with food for 2 weeks, can take with tylenol, can not take with aleve, iburpofen, then as needed daily for pain   Allergies  Allergen Reactions  . Codeine Other (See Comments)    unknown  . Effexor [Venlafaxine]   . Pravastatin   . Prednisone     Decrease sleep  . Tramadol Hcl Itching  . Zinc    Past Medical History  Diagnosis Date  . Hypertension   . Hyperlipidemia   . Prediabetes   . Obesity (BMI 30-39.9)   . Hypogonadism male   . Vitamin D deficiency   . OSA on CPAP    Past Surgical History  Procedure Laterality Date  . Vasectomy    . Abdominal exploration surgery  1998    Celiotomy  . Appendectomy  1998    during exploratory lap  . Arm wound repair / closure Right 05/31/2014    irrigation and exploration/notes 05/31/2014  . Abdominal exploration surgery  1990's    "appendix was on the wrong side; colon twisted"  . Appendectomy  1990's   . Colon surgery  1990's    "colon twisted and put back in place; didn't take any of it out"  . Artery repair Right 05/31/2014    Procedure: IRRIGATION, EXPLORATION, AND REPAIR OF RIGHT ARM WOUND;  Surgeon: Cherylynn Ridges, MD;  Location: [facility] OR;  Service: General;  Laterality: Right;   Review of Systems    Objective:   Physical Exam  BP 122/82 mmHg  Pulse 88  Temp(Src) 97.5 F (36.4 C)  Resp 16  Ht  (1.778 m)  Wt 251 lb 12.8 oz (114.216 kg)  BMI 36.13 kg/m2  HEENT - Eac's patent. TM's Nl. EOM's full. PERRLA. NasoOroPharynx clear. Neck - supple. Nl Thyroid. Carotids 2+ & No bruits, nodes, JVD Chest -Scattered coarse wheezes not totally clearin with cough. Cor - Nl HS. RRR w/o sig MGR. PP 1(+). No edema. Abd - No palpable organomegaly, masses or tenderness. BS nl. MS- FROM w/o deformities. Muscle power, tone and bulk Nl. Gait Nl. Neuro - No obvious Cr N abnormalities. Sensory, motor and Cerebellar functions appear Nl w/o focal abnormalities. Psyche - Mental status normal & appropriate.  No delusions, ideations or obvious mood abnormalities. Skin - clear w/o rashes, cyanosis, icterus.  Assessment & Plan:   1. Asthma, mild intermittent, uncomplicated  - Fluticasone Furoate-Vilanterol (BREO ELLIPTA) 200-25 MCG/INH AEPB; Inhale 200 mcg into the lungs every morning.  Dispense: 60 each; Refill: 5 - predniSONE (DELTASONE) 20 MG tablet; 1 tab 3 x day for 3 days, then 1 tab 2 x day for 3 days, then 1 tab 1 x day for 5 days  Dispense: 20 tablet; Refill: 0  2. Adjustment disorder with mixed anxiety and depressed mood  - Advised taper Citalopram 20 mg to 1/2 tab = 10 mg daily  3. BMI 37.0-37.9, adult  - Discussed dietary management as in the past  ++++++++++++++++  - Discussed meds/SE's  - ROV 1 mo or PRN

## 2015-10-16 NOTE — Patient Instructions (Signed)
   Cut Citalopram 20 mg dose in 1/2

## 2015-10-22 ENCOUNTER — Other Ambulatory Visit: Payer: Self-pay | Admitting: Physician Assistant

## 2015-10-28 ENCOUNTER — Other Ambulatory Visit: Payer: Self-pay | Admitting: Internal Medicine

## 2015-11-13 ENCOUNTER — Ambulatory Visit (INDEPENDENT_AMBULATORY_CARE_PROVIDER_SITE_OTHER): Payer: 59 | Admitting: Physician Assistant

## 2015-11-13 ENCOUNTER — Encounter: Payer: Self-pay | Admitting: Internal Medicine

## 2015-11-13 VITALS — BP 126/90 | HR 84 | Temp 97.5°F | Resp 16 | Ht 70.0 in | Wt 255.4 lb

## 2015-11-13 DIAGNOSIS — R51 Headache: Secondary | ICD-10-CM

## 2015-11-13 DIAGNOSIS — F4323 Adjustment disorder with mixed anxiety and depressed mood: Secondary | ICD-10-CM | POA: Diagnosis not present

## 2015-11-13 DIAGNOSIS — M26609 Unspecified temporomandibular joint disorder, unspecified side: Secondary | ICD-10-CM

## 2015-11-13 DIAGNOSIS — R519 Headache, unspecified: Secondary | ICD-10-CM

## 2015-11-13 MED ORDER — SERTRALINE HCL 50 MG PO TABS: 50.0000 mg | ORAL_TABLET | Freq: Every day | ORAL | Status: DC

## 2015-11-13 MED ORDER — CYCLOBENZAPRINE HCL 10 MG PO TABS
10.0000 mg | ORAL_TABLET | Freq: Every day | ORAL | Status: DC
Start: 2015-11-13 — End: 2016-01-09

## 2015-11-13 NOTE — Patient Instructions (Addendum)
Cut the wellbutrin in half Stop the citalpram Start the zoloft 50mg , 1/2 pill a day for 3-5 days and then go to one pill a day  For the headache take flexeril 10mg  at night 3-5 nights.  Also follow information below, do massage, heating pad, soft foods 3-5 days.   What is the TMJ? The temporomandibular (tem-PUH-ro-man-DIB-yoo-ler) joint, or the TMJ, connects the upper and lower jawbones. This joint allows the jaw to open wide and move back and forth when you chew, talk, or yawn.There are also several muscles that help this joint move. There can be muscle tightness and pain in the muscle that can cause several symptoms.  What causes TMJ pain? There are many causes of TMJ pain. Repeated chewing (for example, chewing gum) and clenching your teeth can cause pain in the joint. Some TMJ pain has no obvious cause. What can I do to ease the pain? There are many things you can do to help your pain get better. When you have pain:  Eat soft foods and stay away from chewy foods (for example, taffy) Try to use both sides of your mouth to chew Don't chew gum Massage Don't open your mouth wide (for example, during yawning or singing) Don't bite your cheeks or fingernails Lower your amount of stress and worry Applying a warm, damp washcloth to the joint may help. Over-the-counter pain medicines such as ibuprofen (one brand: Advil) or acetaminophen (one brand: Tylenol) might also help. Do not use these medicines if you are allergic to them or if your doctor told you not to use them. How can I stop the pain from coming back? When your pain is better, you can do these exercises to make your muscles stronger and to keep the pain from coming back:  Resisted mouth opening: Place your thumb or two fingers under your chin and open your mouth slowly, pushing up lightly on your chin with your thumb. Hold for three to six seconds. Close your mouth slowly. Resisted mouth closing: Place your thumbs under your chin and  your two index fingers on the ridge between your mouth and the bottom of your chin. Push down lightly on your chin as you close your mouth. Tongue up: Slowly open and close your mouth while keeping the tongue touching the roof of the mouth. Side-to-side jaw movement: Place an object about one fourth of an inch thick (for example, two tongue depressors) between your front teeth. Slowly move your jaw from side to side. Increase the thickness of the object as the exercise becomes easier Forward jaw movement: Place an object about one fourth of an inch thick between your front teeth and move the bottom jaw forward so that the bottom teeth are in front of the top teeth. Increase the thickness of the object as the exercise becomes easier. These exercises should not be painful. If it hurts to do these exercises, stop doing them and talk to your family doctor.   [facility] to [facility] and get your machine titrated for you for your CPAP.

## 2015-11-13 NOTE — Progress Notes (Signed)
Assessment and Plan: Headaches- normal neuro- + TMJ- heat, massage, soft foods, flexeril at night x 1 week.  Anxiety/depression- ? Bruxism due to wellbutrin, cut in half, stop celexa due to anorgasma and start on low dose zoloft 50mg , follow up 1 month    HPI 44 y.o.male presents for 1 month med check for anxiety, asthma. His asthma has improved, denies SOB, CP.  He is on wellbutrin 300mg  and celexa 10mg  but still having anorgasma and some ED.  He also complains of headache daily, bilateral temples and frontal headache, sometimes worse than others, does feels he is grinding his teeth. Will have some changes in his vision occ, denies changes in speech, imbalance, dizziness, sinus issues.   Past Medical History  Diagnosis Date  . Hypertension   . Hyperlipidemia   . Prediabetes   . Obesity (BMI 30-39.9)   . Hypogonadism male   . Vitamin D deficiency   . OSA on CPAP      Allergies  Allergen Reactions  . Codeine Other (See Comments)    unknown  . Effexor [Venlafaxine]   . Pravastatin   . Prednisone     Decrease sleep  . Tramadol Hcl Itching  . Zinc       Current Outpatient Prescriptions on File Prior to Visit  Medication Sig Dispense Refill  . ALPRAZOLAM XR 2 MG 24 hr tablet TAKE 1 TABLET BY MOUTH EVERY DAY 30 tablet 5  . atorvastatin (LIPITOR) 80 MG tablet TAKE 1 TABLET EVERY DAY 90 tablet 1  . buPROPion (WELLBUTRIN XL) 300 MG 24 hr tablet TAKE 1 TABLET BY MOUTH EVERY MORNING 90 tablet 1  . Cholecalciferol (VITAMIN D) 2000 UNITS CAPS Take 8,000 Units by mouth daily.    . citalopram (CELEXA) 20 MG tablet Take 1/2 to 1 tablet daily for mood 30 tablet 2  . meloxicam (MOBIC) 15 MG tablet Take one daily with food for 2 weeks, can take with tylenol, can not take with aleve, iburpofen, then as needed daily for pain 30 tablet 1  . ALPRAZolam (XANAX) 1 MG tablet Take 1/2 to 1 tablet up to 3 x day if need for anxiety (Patient not taking: Reported on 11/13/2015) 90 tablet 2   No current  facility-administered medications on file prior to visit.    ROS: all negative except above.   Physical Exam: Filed Weights   11/13/15 1641  Weight: 255 lb 6.4 oz (115.849 kg)   BP 126/90 mmHg  Pulse 84  Temp(Src) 97.5 F (36.4 C)  Resp 16  Ht 5\' 10"  (1.778 m)  Wt 255 lb 6.4 oz (115.849 kg)  BMI 36.65 kg/m2 General Appearance: Well nourished, in no apparent distress. Eyes: PERRLA, EOMs, conjunctiva no swelling or erythema Sinuses: No Frontal/maxillary tenderness ENT/Mouth: Ext aud canals clear, TMs without erythema, bulging. No erythema, swelling, or exudate on post pharynx.  Tonsils not swollen or erythematous. Hearing normal.  Neck: Supple, thyroid normal.  Respiratory: Respiratory effort normal, BS equal bilaterally without rales, rhonchi, wheezing or stridor.  Cardio: RRR with no MRGs. Brisk peripheral pulses without edema.  Abdomen: Soft, + BS.  Non tender, no guarding, rebound, hernias, masses. Lymphatics: Non tender without lymphadenopathy.  Musculoskeletal: Full ROM, 5/5 strength, normal gait.  Skin: Warm, dry without rashes, lesions, ecchymosis.  Neuro: Cranial nerves intact. Normal muscle tone, no cerebellar symptoms. Sensation intact.  Psych: Awake and oriented X 3, normal affect, Insight and Judgment appropriate.     Quentin MullingAmanda Joslin Doell, PA-C 4:48 PM [facility]

## 2015-12-09 ENCOUNTER — Other Ambulatory Visit: Payer: Self-pay | Admitting: Internal Medicine

## 2016-01-01 ENCOUNTER — Ambulatory Visit: Payer: Self-pay | Admitting: Internal Medicine

## 2016-01-09 ENCOUNTER — Ambulatory Visit (INDEPENDENT_AMBULATORY_CARE_PROVIDER_SITE_OTHER): Payer: 59 | Admitting: Internal Medicine

## 2016-01-09 ENCOUNTER — Encounter: Payer: Self-pay | Admitting: Internal Medicine

## 2016-01-09 VITALS — BP 116/72 | HR 94 | Temp 98.0°F | Resp 20 | Ht 70.0 in | Wt 260.0 lb

## 2016-01-09 DIAGNOSIS — J069 Acute upper respiratory infection, unspecified: Secondary | ICD-10-CM

## 2016-01-09 MED ORDER — AZITHROMYCIN 250 MG PO TABS: ORAL_TABLET | ORAL | Status: DC

## 2016-01-09 MED ORDER — PROMETHAZINE-DM 6.25-15 MG/5ML PO SYRP: ORAL_SOLUTION | ORAL | Status: DC

## 2016-01-09 NOTE — Progress Notes (Signed)
Patient ID: Shane Saunders, male   DOB: 07/12/1971, 45 y.o.   MRN: [id number]  HPI  Patient presents to the office for evaluation of cough.  It has been going on for 1 weeks.  Patient reports night > day, dry, worse with lying down.  They also endorse change in voice, chills, postnasal drip, sputum production and nasal congestion, sore throat, sore glands, ears are clogged and mildly painful..  They have tried mucinex and tylenol cough and cold.  They report that nothing has worked.  They admits to other sick contacts.  Review of Systems  Constitutional: Positive for chills. Negative for fever and malaise/fatigue.  HENT: Positive for congestion, ear pain and sore throat.   Respiratory: Positive for cough and sputum production. Negative for shortness of breath and wheezing.   Cardiovascular: Negative for chest pain, palpitations and leg swelling.  Neurological: Negative for headaches.    PE:  General:  Alert and non-toxic, WDWN, NAD HEENT: NCAT, PERLA, EOM normal, no occular discharge or erythema.  Nasal mucosal edema with sinus tenderness to palpation.  Oropharynx clear with minimal oropharyngeal edema and erythema.  Mucous membranes moist and pink. Neck:  Cervical adenopathy Chest:  RRR no MRGs.  Lungs clear to auscultation A&P with no wheezes rhonchi or rales.   Abdomen: +BS x 4 quadrants, soft, non-tender, no guarding, rigidity, or rebound. Skin: warm and dry no rash Neuro: A&Ox4, CN II-XII grossly intact  Assessment and Plan:   1. Acute URI -zpak -phenergan dm -flonase -antihistamine -nasal saline

## 2016-01-09 NOTE — Patient Instructions (Signed)
Please take the zpak until it is gone.  Please take the cough syrup as prescribed 3 times daily to help with congestion and coughing.  Use saline in your nose as often as you can to rinse sinuses out.  Please use flonase, nasacort, or rhinocort.  Generic and store brand are fine.  Get what is on sale.  Use 2 sprays per nostril before bedtime.  Please take claritin, allegra, or zyrtec once daily to help dry up congestion.

## 2016-02-03 ENCOUNTER — Encounter: Payer: Self-pay | Admitting: Internal Medicine

## 2016-02-03 NOTE — Progress Notes (Signed)
Patient ID: Shane Saunders, male   DOB: [birth year], 45 y.o.   MRN: [id number]  Annual  Screening/Preventative Visit And Comprehensive Evaluation & Examination  This very nice 45 y.o. MWM presents for presents for a Wellness/Preventative Visit & comprehensive evaluation and management of multiple medical co-morbidities.  Patient has been followed for HTN, Prediabetes, Hyperlipidemia and Vitamin D Deficiency. Patient also has OSA & is on CPAP and recently resumed usage and reports he is sleeping better and feels more rested. In June 2015, he experienced a significance crush type injury to his RUE and had a protracted recovery. Today he's also c/o sx's consistent with prostatism with sensation of incomplete emptying, decreased urinary stream and occasional double voiding and leaking with the 2sd void. No dysuria, discharge or urine color change    Patient has labile HTN predates since 2010 and is followed expectantly. Patient's BP has been controlled at home.Today's BP: 118/70 mmHg. Patient denies any cardiac symptoms as chest pain, palpitations, shortness of breath, dizziness or ankle swelling.   Patient's hyperlipidemia is controlled with diet and medications. Patient denies myalgias or other medication SE's. Last lipids were at goal with Cholesterol 173; HDL 35*; LDL Cholesterol 100; Triglycerides 192 on 06/15/2015:    Patient has Morbid Obesity (BMI 37+) and is screened proactively for prediabetes and patient denies reactive hypoglycemic symptoms, visual blurring, diabetic polys or paresthesias. Last A1c was 5.8% on 06/15/2015.   Finally, patient has history of Vitamin D Deficiency of "49" in 2008 and "38" in Jan 2016 reflecting his poor med compliance.    Medication Sig  . ALPRAZOLAM XR 2 MG  TAKE 1 TABLET BY MOUTH EVERY DAY  . atorvastatin80 MG  TAKE 1 TABLET EVERY DAY  . VITAMIN D 2000 UNITS  Take 8,000 Units by mouth daily.  . sertraline  50 MG Take 1 tablet (50 mg total) by mouth daily.    Allergies  Allergen Reactions  . Codeine Other (See Comments)    unknown  . Effexor [Venlafaxine]   . Pravastatin   . Prednisone     Decrease sleep  . Tramadol Hcl Itching  . Zinc    Past Medical History  Diagnosis Date  . Hypertension   . Hyperlipidemia   . Prediabetes   . Obesity (BMI 30-39.9)   . Hypogonadism male   . Vitamin D deficiency   . OSA on CPAP    Health Maintenance  Topic Date Due  . INFLUENZA VACCINE  07/30/2015  . TETANUS/TDAP  05/31/2024  . HIV Screening  Completed   Immunization History  Administered Date(s) Administered  . Influenza-Unspecified 10/20/2014  . PPD Test 01/16/2015  . Pneumococcal-Unspecified 10/27/2006  . Tdap 08/05/2013, 05/31/2014   Past Surgical History  Procedure Laterality Date  . Vasectomy    . Abdominal exploration surgery  1998    Celiotomy  . Appendectomy  1998    during exploratory lap  . Arm wound repair / closure Right 05/31/2014    irrigation and exploration/notes 05/31/2014  . Abdominal exploration surgery  1990's    "appendix was on the wrong side; colon twisted"  . Appendectomy  1990's  . Colon surgery  1990's    "colon twisted and put back in place; didn't take any of it out"  . Artery repair Right 05/31/2014    Procedure: IRRIGATION, EXPLORATION, AND REPAIR OF RIGHT ARM WOUND;  Surgeon: Cherylynn Ridges, MD;  Location: [facility] OR;  Service: General;  Laterality: Right;   Family History  Problem Relation Age  of Onset  . Hypertension Mother   . Migraines Mother   . Thyroid disease Mother   . Hypertension Father   . Hyperlipidemia Father   . Diabetes Father   . Cancer Sister     thyroid   Social History   Social History  . Marital Status: Married    Spouse Name: N/A  . Number of Children: N/A  . Years of Education: N/A   Occupational History  . Machine shop operator/welder   Social History Main Topics  . Smoking status: Former Smoker -- 0.50 packs/day for 10 years    Types: Cigarettes    Quit date:  10/27/2004  . Smokeless tobacco: Current User    Types: Snuff, Chew     Comment: "stopped smoking in ~ 2010"  . Alcohol Use: Yes     Comment: rarely  . Drug Use: No  . Sexual Activity: Yes    ROS Constitutional: Denies fever, chills, weight loss/gain, headaches, insomnia,  night sweats or change in appetite. Does c/o fatigue. Eyes: Denies redness, blurred vision, diplopia, discharge, itchy or watery eyes.  ENT: Denies discharge, congestion, post nasal drip, epistaxis, sore throat, earache, hearing loss, dental pain, Tinnitus, Vertigo, Sinus pain or snoring.  Cardio: Denies chest pain, palpitations, irregular heartbeat, syncope, dyspnea, diaphoresis, orthopnea, PND, claudication or edema Respiratory: denies cough, dyspnea, DOE, pleurisy, hoarseness, laryngitis or wheezing.  Gastrointestinal: Denies dysphagia, heartburn, reflux, water brash, pain, cramps, nausea, vomiting, bloating, diarrhea, constipation, hematemesis, melena, hematochezia, jaundice or hemorrhoids Genitourinary: ()+) sx's as above. Musculoskeletal: Denies arthralgia, myalgia, stiffness, Jt. Swelling, pain, limp or strain/sprain. Denies Falls. Skin: Denies puritis, rash, hives, warts, acne, eczema or change in skin lesion Neuro: No weakness, tremor, incoordination, spasms, paresthesia or pain Psychiatric: Denies confusion, memory loss or sensory loss. Depression irritability & anxiety sx's are improved since on Zoloft, but he feels that he still has "room for improvement " and would like to increase his dose. Endocrine: Denies change in weight, skin, hair change, nocturia, and paresthesia, diabetic polys, visual blurring or hyper / hypo glycemic episodes.  Heme/Lymph: No excessive bleeding, bruising or enlarged lymph nodes.  Physical Exam  BP 118/70 mmHg  Pulse 89  Temp(Src) 97.7 F (36.5 C)  Resp 16  Ht  (1.778 m)  Wt 264 lb (119.75 kg)  BMI 37.88 kg/m2  SpO2 98%  General Appearance: Over nourished with  central obesity, in no apparent distress. Eyes: PERRLA, EOMs, conjunctiva no swelling or erythema, normal fundi and vessels. Sinuses: No frontal/maxillary tenderness ENT/Mouth: EACs patent / TMs  nl. Nares clear without erythema, swelling, mucoid exudates. Oral hygiene is good. No erythema, swelling, or exudate. Tongue normal, non-obstructing. Tonsils not swollen or erythematous. Hearing normal.  Neck: Supple, thyroid normal. No bruits, nodes or JVD. Respiratory: Respiratory effort normal.  BS equal and clear bilateral without rales, rhonci, wheezing or stridor. Cardio: Heart sounds are normal with regular rate and rhythm and no murmurs, rubs or gallops. Peripheral pulses are normal and equal bilaterally without edema. No aortic or femoral bruits. Chest: symmetric with normal excursions and percussion.  Abdomen: Soft, Rotundwith Nl bowel sounds. Nontender, no guarding, rebound, hernias, masses, or organomegaly.  Lymphatics: Non tender without lymphadenopathy.  Genitourinary: No hernias.Testes nl. DRE - prostate nl for age - smooth, nontender & firm w/o nodules. Musculoskeletal: Full ROM all peripheral extremities, joint stability, 5/5 strength, and normal gait. Skin: Warm and dry without rashes, lesions, cyanosis, clubbing or  ecchymosis.  Neuro: Cranial nerves intact, reflexes equal bilaterally. Normal  muscle tone, no cerebellar symptoms. Sensation intact.  Pysch: Alert and oriented X 3 with normal affect, insight and judgment appropriate.   Assessment and Plan  1. Annual Preventative/Screening Exam   - Microalbumin / creatinine urine ratio - EKG 12-Lead - POC Hemoccult Bld/Stl (3-Cd Home Screen); Future - Urinalysis, Routine w reflex microscopic (not at [facility]) - Vitamin B12 - Iron and TIBC - PSA - Testosterone - CBC with Differential/Platelet - BASIC METABOLIC PANEL WITH GFR - Hepatic function panel - Magnesium - Lipid panel - TSH - Hemoglobin A1c - Insulin, random - VITAMIN D 25  Hydroxy  2. Essential hypertension  - Microalbumin / creatinine urine ratio - EKG 12-Lead - TSH  3. Hyperlipidemia  - Lipid panel - TSH  4. Prediabetes  - Hemoglobin A1c - Insulin, random  5. Vitamin D deficiency  - VITAMIN D 25 Hydroxy   6. Testosterone deficiency  - Testosterone  7. OSA on CPAP   8. Obesity (BMI 35.47)   9. Screening for rectal cancer  - POC Hemoccult Bld/Stl (  10. Prostate cancer screening  - PSA  11. Other fatigue  - Vitamin B12 - Iron and TIBC - Testosterone - CBC with Differential/Platelet - TSH  12. Anxiety state  - sertraline (ZOLOFT) 100 MG tablet; Take 1 tablet daily  Dispense: 90 tablet; Refill: 1  13. Prostatism  - tamsulosin (FLOMAX) 0.4 MG CAPS capsule; Take 1 capsule at Bedtime for Prostate  Dispense: 90 capsule; Refill: 1  14. Medication management  - Urinalysis, Routine w reflex microscopic (not at [facility]) - CBC with Differential/Platelet - BASIC METABOLIC PANEL WITH GFR - Hepatic function panel - Magnesium   Continue prudent diet as discussed, weight control, BP monitoring, regular exercise, and medications as discussed.  Discussed med effects and SE's. Routine screening labs and tests as requested with regular follow-up as recommended. Over 40 minutes of exam, counseling, chart review and high complex critical decision making was performed

## 2016-02-03 NOTE — Patient Instructions (Addendum)
Benign Prostatic Hypertrophy  The prostate gland is part of the reproductive system of men. A normal prostate is about the size and shape of a walnut. The prostate gland produces a fluid that is mixed with sperm to make semen. This gland surrounds the urethra and is located in front of the rectum and just below the bladder. The bladder is where urine is stored. The urethra is the tube through which urine passes from the bladder to get out of the body. The prostate grows as a man ages. An enlarged prostate not caused by cancer is called benign prostatic hypertrophy (BPH). An enlarged prostate can press on the urethra. This can make it harder to pass urine. In the early stages of enlargement, the bladder can get by with a narrowed urethra by forcing the urine through. If the problem gets worse, medical or surgical treatment may be required.  This condition should be followed by your health care provider. The accumulation of urine in the bladder can cause infection. Back pressure and infection can progress to bladder damage and kidney (renal) failure. If needed, your health care provider may refer you to a specialist in kidney and prostate disease (urologist). CAUSES  BPH is a common health problem in men older than 50 years. This condition is a normal part of aging. However, not all men will develop problems from this condition. If the enlargement grows away from the urethra, then there will not be any compression of the urethra and resistance to urine flow.If the growth is toward the urethra and compresses it, you will experience difficulty urinating.  SYMPTOMS   Not able to completely empty your bladder.  Getting up often during the night to urinate.  Need to urinate frequently during the day.  Difficultly starting urine flow.  Decrease in size and strength of your urine stream.  Dribbling after urination.  Pain on urination (more common with infection).  Inability to pass urine. This needs  immediate treatment.  The development of a urinary tract infection. DIAGNOSIS  These tests will help your health care provider understand your problem:  A thorough history and physical examination.  A urination history, with the number of times you urinate, the amounts of urine, the strength of the urine stream, and the feeling of emptiness or fullness after urinating.  A postvoid bladder scan that measures any amount of urine that may remain in your bladder after you finish urinating.  Digital rectal exam. In a rectal exam, your health care provider checks your prostate by putting a gloved, lubricated finger into your rectum to feel the back of your prostate gland. This exam detects the size of your gland and abnormal lumps or growths.  Exam of your urine (urinalysis).  Prostate specific antigen (PSA) screening. This is a blood test used to screen for prostate cancer.  Rectal ultrasonography. This test uses sound waves to electronically produce a picture of your prostate gland. TREATMENT  Once symptoms begin, your health care provider will monitor your condition. Of the men with this condition, one third will have symptoms that stabilize, one third will have symptoms that improve, and one third will have symptoms that progress in the first year. Mild symptoms may not need treatment. Simple observation and yearly exams may be all that is required. Medicines and surgery are options for more severe problems. Your health care provider can help you make an informed decision for what is best. Two classes of medicines are available for relief of prostate symptoms:  Medicines that shrink the prostate. This helps relieve symptoms. These medicines take time to work, and it may be months before any improvement is seen.  Uncommon side effects include problems with sexual function.  Medicines to relax the muscle of the prostate. This also relieves the obstruction by reducing any compression on the  urethra.This group of medicines work much faster than those that reduce the size of the prostate gland. Usually, one can experience improvement in days to weeks..  Side effects can include dizziness, fatigue, lightheadedness, and retrograde ejaculation (diminished volume of ejaculate). Several types of surgical treatments are available for relief of prostate symptoms:  Transurethral resection of the prostate (TURP)--In this treatment, an instrument is inserted through opening at the tip of the penis. It is used to cut away pieces of the inner core of the prostate. The pieces are removed through the same opening of the penis. This removes the obstruction and helps get rid of the symptoms.  Transurethral incision (TUIP)--In this procedure, small cuts are made in the prostate. This lessens the prostates pressure on the urethra.  Transurethral microwave thermotherapy (TUMT)--This procedure uses microwaves to create heat. The heat destroys and removes a small amount of prostate tissue.  Transurethral needle ablation (TUNA)--This is a procedure that uses radio frequencies to do the same as TUMT.  Interstitial laser coagulation (ILC)--This is a procedure that uses a laser to do the same as TUMT and TUNA.  Transurethral electrovaporization (TUVP)--This is a procedure that uses electrodes to do the same as the procedures listed above. SEEK MEDICAL CARE IF:   You develop a fever.  There is unexplained back pain.  Symptoms are not helped by medicines prescribed.  You develop side effects from the medicine you are taking.  Your urine becomes very dark or has a bad smell.  Your lower abdomen becomes distended and you have difficulty passing your urine. SEEK IMMEDIATE MEDICAL CARE IF:   You are suddenly unable to urinate. This is an emergency. You should be seen immediately.  There are large amounts of blood or clots in the urine.  Your urinary problems become unmanageable.  You develop  lightheadedness, severe dizziness, or you feel faint.  You develop moderate to severe low back or flank pain.  You develop chills or fever.   This information is not intended to replace advice given to you by your health care provider. Make sure you discuss any questions you have with your health care provider.   +++++++++++++++++++++++++++++++++++++++ 9 Ways to Naturally Increase Testosterone Levels  1.   Lose Weight  If you're overweight, shedding the excess pounds may increase your testosterone levels, according to research presented at the Endocrine Society's 2012 meeting. Overweight men are more likely to have low testosterone levels to begin with, so this is an important trick to increase your body's testosterone production when you need it most.  2.   High-Intensity Exercise like Peak Fitness   Short intense exercise has a proven positive effect on increasing testosterone levels and preventing its decline. That's unlike aerobics or prolonged moderate exercise, which have shown to have negative or no effect on testosterone levels. Having a whey protein meal after exercise can further enhance the satiety/testosterone-boosting impact (hunger hormones cause the opposite effect on your testosterone and libido). Here's a summary of what a typical high-intensity Peak Fitness routine might look like: " Warm up for three minutes  " Exercise as hard and fast as you can for 30 seconds. You should feel like you  couldn't possibly go on another few seconds  " Recover at a slow to moderate pace for 90 seconds  " Repeat the high intensity exercise and recovery 7 more times .  3.   Consume Plenty of Zinc  The mineral zinc is important for testosterone production, and supplementing your diet for as little as six weeks has been shown to cause a marked improvement in testosterone among men with low levels.1 Likewise, research has shown that restricting dietary sources of zinc leads to a significant  decrease in testosterone, while zinc supplementation increases it2 -- and even protects men from exercised-induced reductions in testosterone levels.3 It's estimated that up to 45 percent of adults over the age of 60 may have lower than recommended zinc intakes; even when dietary supplements were added in, an estimated 20-25 percent of older adults still had inadequate zinc intakes, according to a [employer] and Nutrition Examination Survey.4 Your diet is the best source of zinc; along with protein-rich foods like meats and fish, other good dietary sources of zinc include raw milk, raw cheese, beans, and yogurt or kefir made from raw milk. It can be difficult to obtain enough dietary zinc if you're a vegetarian, and also for meat-eaters as well, largely because of conventional farming methods that rely heavily on chemical fertilizers and pesticides. These chemicals deplete the soil of nutrients ... nutrients like zinc that must be absorbed by plants in order to be passed on to you. In many cases, you may further deplete the nutrients in your food by the way you prepare it. For most food, cooking it will drastically reduce its levels of nutrients like zinc . particularly over-cooking, which many people do. If you decide to use a zinc supplement, stick to a dosage of less than 40 mg a day, as this is the recommended adult upper limit. Taking too much zinc can interfere with your body's ability to absorb other minerals, especially copper, and may cause nausea as a side effect.  4.   Strength Training  In addition to Peak Fitness, strength training is also known to boost testosterone levels, provided you are doing so intensely enough. When strength training to boost testosterone, you'll want to increase the weight and lower your number of reps, and then focus on exercises that work a large number of muscles, such as dead lifts or squats.  You can "turbo-charge" your weight training by going slower. By  slowing down your movement, you're actually turning it into a high-intensity exercise. Super Slow movement allows your muscle, at the microscopic level, to access the maximum number of cross-bridges between the protein filaments that produce movement in the muscle.   5.   Optimize Your Vitamin D Levels  Vitamin D, a steroid hormone, is essential for the healthy development of the nucleus of the sperm cell, and helps maintain semen quality and sperm count. Vitamin D also increases levels of testosterone, which may boost libido. In one study, overweight men who were given vitamin D supplements had a significant increase in testosterone levels after one year.5   6.   Reduce Stress  When you're under a lot of stress, your body releases high levels of the stress hormone cortisol. This hormone actually blocks the effects of testosterone,6 presumably because, from a biological standpoint, testosterone-associated behaviors (mating, competing, aggression) may have lowered your chances of survival in an emergency (hence, the "fight or flight" response is dominant, courtesy of cortisol).  7.   Limit or Eliminate Sugar from Your  Diet  Testosterone levels decrease after you eat sugar, which is likely because the sugar leads to a high insulin level, another factor leading to low testosterone.7 Based on USDA estimates, the average American consumes 12 teaspoons of sugar a day, which equates to about TWO TONS of sugar during a lifetime.  8.   Eat Healthy Fats  By healthy, this means not only mono- and polyunsaturated fats, like that found in avocadoes and nuts, but also saturated, as these are essential for building testosterone. Research shows that a diet with less than 40 percent of energy as fat (and that mainly from animal sources, i.e. saturated) lead to a decrease in testosterone levels. ie eat less animal products - as Meat , poultry and dairy. Experts agree that the ideal diet includes somewhere between  50-70 percent fat.  It's important to understand that your body requires saturated fats from animal and vegetable sources (such as meat, dairy, certain oils, and tropical plants like coconut) for optimal functioning, and if you neglect this important food group in favor of sugar, grains and other starchy carbs, your health and weight are almost guaranteed to suffer. Examples of healthy fats you can eat more of to give your testosterone levels a boost include: Olives and Olive oil  Coconuts and coconut oil Butter made from raw grass-fed organic milk Raw nuts, such as, almonds or pecans Organic pastured egg yolks Avocados Grass-fed meats Palm oil Unheated organic nut oils   9.   Boost Your Intake of Branch Chain Amino Acids (BCAA) from Foods Like Whey Protein Research suggests that BCAAs result in higher testosterone levels, particularly when taken along with resistance training. While BCAAs are available in supplement form, you'll find the highest concentrations of BCAAs like leucine in whey protein. Even when getting leucine from your natural food supply, it's often wasted or used as a building block instead of an anabolic agent. So to create the correct anabolic environment, you need to boost leucine consumption way beyond mere maintenance levels. That said, keep in mind that using leucine as a free form amino acid can be highly counterproductive as when free form amino acids are artificially administrated, they rapidly enter your circulation while disrupting insulin function, and impairing your body's glycemic control. Food-based leucine is really the ideal form that can benefit your muscles without side effects.  +++++++++++++++++++++++++++++++++++++++++++++++++++++++++++++++++++++++++ Recommend Adult Low Dose Aspirin or   coated  Aspirin 81 mg daily   To reduce risk of Colon Cancer 20 %,   Skin Cancer 26 % ,   Melanoma 46%   and   Pancreatic cancer  60%   ++++++++++++++++++++++++++++++++++++++++++++++++++++++ Vitamin D goal   is between 70-100.   Please make sure that you are taking your Vitamin D as directed.   It is very important as a natural anti-inflammatory   helping hair, skin, and nails, as well as reducing stroke and heart attack risk.   It helps your bones and helps with mood.  It also decreases numerous cancer risks so please take it as directed.   Low Vit D is associated with a 200-300% higher risk for CANCER   and 200-300% higher risk for HEART   ATTACK  &  STROKE.   .....................................Marland Kitchen  It is also associated with higher death rate at younger ages,   autoimmune diseases like Rheumatoid arthritis, Lupus, Multiple Sclerosis.     Also many other serious conditions, like depression, Alzheimer's  Dementia, infertility, muscle aches, fatigue, fibromyalgia - just to name a  few.  ++++++++++++++++++++++++++++++++++++++++++++++++  Recommend the book "The END of DIETING" by Dr Monico Hoar   & the book "The END of DIABETES " by Dr Monico Hoar  At [email or web address] - get book & Audio CD's     Being diabetic has a  300% increased risk for heart attack, stroke, cancer, and alzheimer- type vascular dementia. It is very important that you work harder with diet by avoiding all foods that are white. Avoid white rice (brown & wild rice is OK), white potatoes (sweetpotatoes in moderation is OK), White bread or wheat bread or anything made out of white flour like bagels, donuts, rolls, buns, biscuits, cakes, pastries, cookies, pizza crust, and pasta (made from white flour & egg whites) - vegetarian pasta or spinach or wheat pasta is OK. Multigrain breads like Arnold's or Pepperidge Farm, or multigrain sandwich thins or flatbreads.  Diet, exercise and weight loss can reverse and cure diabetes in the early stages.  Diet, exercise and weight loss is very important in the control and prevention of complications of  diabetes which affects every system in your body, ie. Brain - dementia/stroke, eyes - glaucoma/blindness, heart - heart attack/heart failure, kidneys - dialysis, stomach - gastric paralysis, intestines - malabsorption, nerves - severe painful neuritis, circulation - gangrene & loss of a leg(s), and finally cancer and Alzheimers.    I recommend avoid fried & greasy foods,  sweets/candy, white rice (brown or wild rice or Quinoa is OK), white potatoes (sweet potatoes are OK) - anything made from white flour - bagels, doughnuts, rolls, buns, biscuits,white and wheat breads, pizza crust and traditional pasta made of white flour & egg white(vegetarian pasta or spinach or wheat pasta is OK).  Multi-grain bread is OK - like multi-grain flat bread or sandwich thins. Avoid alcohol in excess. Exercise is also important.    Eat all the vegetables you want - avoid meat, especially red meat and dairy - especially cheese.  Cheese is the most concentrated form of trans-fats which is the worst thing to clog up our arteries. Veggie cheese is OK which can be found in the fresh produce section at [employer] or [employer] or [employer]  ++++++++++++++++++++++++++++++++++++++++++++++++++ DASH Eating Plan  DASH stands for "Dietary Approaches to Stop Hypertension."   The DASH eating plan is a healthy eating plan that has been shown to reduce high blood pressure (hypertension). Additional health benefits may include reducing the risk of type 2 diabetes mellitus, heart disease, and stroke. The DASH eating plan may also help with weight loss.  WHAT DO I NEED TO KNOW ABOUT THE DASH EATING PLAN?  For the DASH eating plan, you will follow these general guidelines:  Choose foods with a percent daily value for sodium of less than 5% (as listed on the food label).  Use salt-free seasonings or herbs instead of table salt or sea salt.  Check with your health care provider or pharmacist before using salt substitutes.  Eat  lower-sodium products, often labeled as "lower sodium" or "no salt added."  Eat fresh foods.  Eat more vegetables, fruits, and low-fat dairy products.    Choose whole grains. Look for the word "whole" as the first word in the ingredient list.  Choose fish   Limit sweets, desserts, sugars, and sugary drinks.  Choose heart-healthy fats.  Eat veggie cheese   Eat more home-cooked food and less restaurant, buffet, and fast food.  Limit fried foods.  Cook foods using methods other than frying.  Limit canned vegetables. If  you do use them, rinse them well to decrease the sodium.  When eating at a restaurant, ask that your food be prepared with less salt, or no salt if possible.                      WHAT FOODS CAN I EAT?  Read Dr Francis Dowse Fuhrman's books on The End of Dieting & The End of Diabetes  Grains  Whole grain or whole wheat bread. Brown rice. Whole grain or whole wheat pasta. Quinoa, bulgur, and whole grain cereals. Low-sodium cereals. Corn or whole wheat flour tortillas. Whole grain cornbread. Whole grain crackers. Low-sodium crackers.  Vegetables  Fresh or frozen vegetables (raw, steamed, roasted, or grilled). Low-sodium or reduced-sodium tomato and vegetable juices. Low-sodium or reduced-sodium tomato sauce and paste. Low-sodium or reduced-sodium canned vegetables.   Fruits  All fresh, canned (in natural juice), or frozen fruits.  Protein Products   All fish and seafood.  Dried beans, peas, or lentils. Unsalted nuts and seeds. Unsalted canned beans.  Dairy  Low-fat dairy products, such as skim or 1% milk, 2% or reduced-fat cheeses, low-fat ricotta or cottage cheese, or plain low-fat yogurt. Low-sodium or reduced-sodium cheeses.  Fats and Oils  Tub margarines without trans fats. Light or reduced-fat mayonnaise and salad dressings (reduced sodium). Avocado. Safflower, olive, or canola oils. Natural peanut or almond butter.  Other  Unsalted popcorn and  pretzels. The items listed above may not be a complete list of recommended foods or beverages. Contact your dietitian for more options.  +++++++++++++++++++++++++++++++++++++++++++  WHAT FOODS ARE NOT RECOMMENDED?  Grains/ White flour or wheat flour  White bread. White pasta. White rice. Refined cornbread. Bagels and croissants. Crackers that contain trans fat.  Vegetables  Creamed or fried vegetables. Vegetables in a . Regular canned vegetables. Regular canned tomato sauce and paste. Regular tomato and vegetable juices.  Fruits  Dried fruits. Canned fruit in light or heavy syrup. Fruit juice.  Meat and Other Protein Products  Meat in general - RED mwaet & White meat.  Fatty cuts of meat. Ribs, chicken wings, bacon, sausage, bologna, salami, chitterlings, fatback, hot dogs, bratwurst, and packaged luncheon meats.  Dairy  Whole or 2% milk, cream, half-and-half, and cream cheese. Whole-fat or sweetened yogurt. Full-fat cheeses or blue cheese. Nondairy creamers and whipped toppings. Processed cheese, cheese spreads, or cheese curds.  Condiments  Onion and garlic salt, seasoned salt, table salt, and sea salt. Canned and packaged gravies. Worcestershire sauce. Tartar sauce. Barbecue sauce. Teriyaki sauce. Soy sauce, including reduced sodium. Steak sauce. Fish sauce. Oyster sauce. Cocktail sauce. Horseradish. Ketchup and mustard. Meat flavorings and tenderizers. Bouillon cubes. Hot sauce. Tabasco sauce. Marinades. Taco seasonings. Relishes.  Fats and Oils Butter, stick margarine, lard, shortening and bacon fat. Coconut, palm kernel, or palm oils. Regular salad dressings.  Pickles and olives. Salted popcorn and pretzels.  The items listed above may not be a complete list of foods and beverages to avoid.  ++++++++++++++++++++++++++++++++++++++++++++++++++++++++++++++++++++++   Preventive Care for Adults  A healthy lifestyle and preventive care can promote health and wellness.  Preventive health guidelines for men include the following key practices:  A routine yearly physical is a good way to check with your health care provider about your health and preventative screening. It is a chance to share any concerns and updates on your health and to receive a thorough exam.  Visit your dentist for a routine exam and preventative care every 6 months. Brush your teeth twice  a day and floss once a day. Good oral hygiene prevents tooth decay and gum disease.  The frequency of eye exams is based on your age, health, family medical history, use of contact lenses, and other factors. Follow your health care provider's recommendations for frequency of eye exams.  Eat a healthy diet. Foods such as vegetables, fruits, whole grains, low-fat dairy products, and lean protein foods contain the nutrients you need without too many calories. Decrease your intake of foods high in solid fats, added sugars, and salt. Eat the right amount of calories for you.Get information about a proper diet from your health care provider, if necessary.  Regular physical exercise is one of the most important things you can do for your health. Most adults should get at least 150 minutes of moderate-intensity exercise (any activity that increases your heart rate and causes you to sweat) each week. In addition, most adults need muscle-strengthening exercises on 2 or more days a week.  Maintain a healthy weight. The body mass index (BMI) is a screening tool to identify possible weight problems. It provides an estimate of body fat based on height and weight. Your health care provider can find your BMI and can help you achieve or maintain a healthy weight.For adults 20 years and older:  A BMI below 18.5 is considered underweight.  A BMI of 18.5 to 24.9 is normal.  A BMI of 25 to 29.9 is considered overweight.  A BMI of 30 and above is considered obese.  Maintain normal blood lipids and cholesterol levels by  exercising and minimizing your intake of saturated fat. Eat a balanced diet with plenty of fruit and vegetables. Blood tests for lipids and cholesterol should begin at age 38 and be repeated every 5 years. If your lipid or cholesterol levels are high, you are over 50, or you are at high risk for heart disease, you may need your cholesterol levels checked more frequently.Ongoing high lipid and cholesterol levels should be treated with medicines if diet and exercise are not working.  If you smoke, find out from your health care provider how to quit. If you do not use tobacco, do not start.  Lung cancer screening is recommended for adults aged 55-80 years who are at high risk for developing lung cancer because of a history of smoking. A yearly low-dose CT scan of the lungs is recommended for people who have at least a 30-pack-year history of smoking and are a current smoker or have quit within the past 15 years. A pack year of smoking is smoking an average of 1 pack of cigarettes a day for 1 year (for example: 1 pack a day for 30 years or 2 packs a day for 15 years). Yearly screening should continue until the smoker has stopped smoking for at least 15 years. Yearly screening should be stopped for people who develop a health problem that would prevent them from having lung cancer treatment.  If you choose to drink alcohol, do not have more than 2 drinks per day. One drink is considered to be 12 ounces (355 mL) of beer, 5 ounces (148 mL) of wine, or 1.5 ounces (44 mL) of liquor.  Avoid use of street drugs. Do not share needles with anyone. Ask for help if you need support or instructions about stopping the use of drugs.  High blood pressure causes heart disease and increases the risk of stroke. Your blood pressure should be checked at least every 1-2 years. Ongoing high blood pressure  should be treated with medicines, if weight loss and exercise are not effective.  If you are 39-22 years old, ask your health  care provider if you should take aspirin to prevent heart disease.  Diabetes screening involves taking a blood sample to check your fasting blood sugar level. This should be done once every 3 years, after age 79, if you are within normal weight and without risk factors for diabetes. Testing should be considered at a younger age or be carried out more frequently if you are overweight and have at least 1 risk factor for diabetes.  Colorectal cancer can be detected and often prevented. Most routine colorectal cancer screening begins at the age of 64 and continues through age 34. However, your health care provider may recommend screening at an earlier age if you have risk factors for colon cancer. On a yearly basis, your health care provider may provide home test kits to check for hidden blood in the stool. Use of a small camera at the end of a tube to directly examine the colon (sigmoidoscopy or colonoscopy) can detect the earliest forms of colorectal cancer. Talk to your health care provider about this at age 28, when routine screening begins. Direct exam of the colon should be repeated every 5-10 years through age 61, unless early forms of precancerous polyps or small growths are found.   Talk with your health care provider about prostate cancer screening.  Testicular cancer screening isrecommended for adult males. Screening includes self-exam, a health care provider exam, and other screening tests. Consult with your health care provider about any symptoms you have or any concerns you have about testicular cancer.  Use sunscreen. Apply sunscreen liberally and repeatedly throughout the day. You should seek shade when your shadow is shorter than you. Protect yourself by wearing long sleeves, pants, a wide-brimmed hat, and sunglasses year round, whenever you are outdoors.  Once a month, do a whole-body skin exam, using a mirror to look at the skin on your back. Tell your health care provider about new moles,  moles that have irregular borders, moles that are larger than a pencil eraser, or moles that have changed in shape or color.  Stay current with required vaccines (immunizations).  Influenza vaccine. All adults should be immunized every year.  Tetanus, diphtheria, and acellular pertussis (Td, Tdap) vaccine. An adult who has not previously received Tdap or who does not know his vaccine status should receive 1 dose of Tdap. This initial dose should be followed by tetanus and diphtheria toxoids (Td) booster doses every 10 years. Adults with an unknown or incomplete history of completing a 3-dose immunization series with Td-containing vaccines should begin or complete a primary immunization series including a Tdap dose. Adults should receive a Td booster every 10 years.  Varicella vaccine. An adult without evidence of immunity to varicella should receive 2 doses or a second dose if he has previously received 1 dose.  Human papillomavirus (HPV) vaccine. Males aged 13-21 years who have not received the vaccine previously should receive the 3-dose series. Males aged 22-26 years may be immunized. Immunization is recommended through the age of 57 years for any male who has sex with males and did not get any or all doses earlier. Immunization is recommended for any person with an immunocompromised condition through the age of 26 years if he did not get any or all doses earlier. During the 3-dose series, the second dose should be obtained 4-8 weeks after the first dose. The  third dose should be obtained 24 weeks after the first dose and 16 weeks after the second dose.  Zoster vaccine. One dose is recommended for adults aged 87 years or older unless certain conditions are present.    PREVNAR  - Pneumococcal 13-valent conjugate (PCV13) vaccine. When indicated, a person who is uncertain of his immunization history and has no record of immunization should receive the PCV13 vaccine. An adult aged 52 years or older  who has certain medical conditions and has not been previously immunized should receive 1 dose of PCV13 vaccine. This PCV13 should be followed with a dose of pneumococcal polysaccharide (PPSV23) vaccine. The PPSV23 vaccine dose should be obtained at least 8 weeks after the dose of PCV13 vaccine. An adult aged 73 years or older who has certain medical conditions and previously received 1 or more doses of PPSV23 vaccine should receive 1 dose of PCV13. The PCV13 vaccine dose should be obtained 1 or more years after the last PPSV23 vaccine dose.    PNEUMOVAX - Pneumococcal polysaccharide (PPSV23) vaccine. When PCV13 is also indicated, PCV13 should be obtained first. All adults aged 86 years and older should be immunized. An adult younger than age 41 years who has certain medical conditions should be immunized. Any person who resides in a nursing home or long-term care facility should be immunized. An adult smoker should be immunized. People with an immunocompromised condition and certain other conditions should receive both PCV13 and PPSV23 vaccines. People with human immunodeficiency virus (HIV) infection should be immunized as soon as possible after diagnosis. Immunization during chemotherapy or radiation therapy should be avoided. Routine use of PPSV23 vaccine is not recommended for American Indians, [address], or people younger than 65 years unless there are medical conditions that require PPSV23 vaccine. When indicated, people who have unknown immunization and have no record of immunization should receive PPSV23 vaccine. One-time revaccination 5 years after the first dose of PPSV23 is recommended for people aged 19-64 years who have chronic kidney failure, nephrotic syndrome, asplenia, or immunocompromised conditions. People who received 1-2 doses of PPSV23 before age 14 years should receive another dose of PPSV23 vaccine at age 66 years or later if at least 5 years have passed since the previous dose.  Doses of PPSV23 are not needed for people immunized with PPSV23 at or after age 48 years.    Hepatitis A vaccine. Adults who wish to be protected from this disease, have certain high-risk conditions, work with hepatitis A-infected animals, work in hepatitis A research labs, or travel to or work in countries with a high rate of hepatitis A should be immunized. Adults who were previously unvaccinated and who anticipate close contact with an international adoptee during the first 60 days after arrival in the [place] from a country with a high rate of hepatitis A should be immunized.    Hepatitis B vaccine. Adults should be immunized if they wish to be protected from this disease, have certain high-risk conditions, may be exposed to blood or other infectious body fluids, are household contacts or sex partners of hepatitis B positive people, are clients or workers in certain care facilities, or travel to or work in countries with a high rate of hepatitis B.   Preventive Service / Frequency   Ages 48 to 55  Blood pressure check.  Lipid and cholesterol check  Lung cancer screening. / Every year if you are aged 55-80 years and have a 30-pack-year history of smoking and currently smoke or have  quit within the past 15 years. Yearly screening is stopped once you have quit smoking for at least 15 years or develop a health problem that would prevent you from having lung cancer treatment.  Fecal occult blood test (FOBT) of stool. / Every year beginning at age 31 and continuing until age 77. You may not have to do this test if you get a colonoscopy every 10 years.  Flexible sigmoidoscopy** or colonoscopy.** / Every 5 years for a flexible sigmoidoscopy or every 10 years for a colonoscopy beginning at age 21 and continuing until age 63. Screening for abdominal aortic aneurysm (AAA)  by ultrasound is recommended for people who have history of high blood pressure or who are current or former smokers.

## 2016-02-04 ENCOUNTER — Encounter: Payer: Self-pay | Admitting: Internal Medicine

## 2016-02-04 ENCOUNTER — Other Ambulatory Visit: Payer: Self-pay | Admitting: Physician Assistant

## 2016-02-04 ENCOUNTER — Ambulatory Visit (INDEPENDENT_AMBULATORY_CARE_PROVIDER_SITE_OTHER): Payer: 59 | Admitting: Internal Medicine

## 2016-02-04 VITALS — BP 118/70 | HR 89 | Temp 97.7°F | Resp 16 | Ht 70.0 in | Wt 264.0 lb

## 2016-02-04 DIAGNOSIS — Z0001 Encounter for general adult medical examination with abnormal findings: Secondary | ICD-10-CM

## 2016-02-04 DIAGNOSIS — I10 Essential (primary) hypertension: Secondary | ICD-10-CM

## 2016-02-04 DIAGNOSIS — E291 Testicular hypofunction: Secondary | ICD-10-CM

## 2016-02-04 DIAGNOSIS — R7303 Prediabetes: Secondary | ICD-10-CM

## 2016-02-04 DIAGNOSIS — R5383 Other fatigue: Secondary | ICD-10-CM

## 2016-02-04 DIAGNOSIS — Z0000 Encounter for general adult medical examination without abnormal findings: Secondary | ICD-10-CM | POA: Diagnosis not present

## 2016-02-04 DIAGNOSIS — Z125 Encounter for screening for malignant neoplasm of prostate: Secondary | ICD-10-CM

## 2016-02-04 DIAGNOSIS — Z9989 Dependence on other enabling machines and devices: Secondary | ICD-10-CM

## 2016-02-04 DIAGNOSIS — F411 Generalized anxiety disorder: Secondary | ICD-10-CM

## 2016-02-04 DIAGNOSIS — N400 Enlarged prostate without lower urinary tract symptoms: Secondary | ICD-10-CM

## 2016-02-04 DIAGNOSIS — E785 Hyperlipidemia, unspecified: Secondary | ICD-10-CM

## 2016-02-04 DIAGNOSIS — Z1212 Encounter for screening for malignant neoplasm of rectum: Secondary | ICD-10-CM | POA: Diagnosis not present

## 2016-02-04 DIAGNOSIS — E669 Obesity, unspecified: Secondary | ICD-10-CM

## 2016-02-04 DIAGNOSIS — Z79899 Other long term (current) drug therapy: Secondary | ICD-10-CM

## 2016-02-04 DIAGNOSIS — E559 Vitamin D deficiency, unspecified: Secondary | ICD-10-CM

## 2016-02-04 DIAGNOSIS — G4733 Obstructive sleep apnea (adult) (pediatric): Secondary | ICD-10-CM

## 2016-02-04 LAB — CBC WITH DIFFERENTIAL/PLATELET
Basophils Absolute: 0.1 10*3/uL (ref 0.0–0.1)
Basophils Relative: 1 % (ref 0–1)
Eosinophils Absolute: 0.3 10*3/uL (ref 0.0–0.7)
Eosinophils Relative: 6 % — ABNORMAL HIGH (ref 0–5)
HCT: 42.7 % (ref 39.0–52.0)
Hemoglobin: 14.8 g/dL (ref 13.0–17.0)
Lymphocytes Relative: 31 % (ref 12–46)
Lymphs Abs: 1.8 10*3/uL (ref 0.7–4.0)
MCH: 31.9 pg (ref 26.0–34.0)
MCHC: 34.7 g/dL (ref 30.0–36.0)
MCV: 92 fL (ref 78.0–100.0)
MPV: 10.3 fL (ref 8.6–12.4)
Monocytes Absolute: 0.6 10*3/uL (ref 0.1–1.0)
Monocytes Relative: 10 % (ref 3–12)
Neutro Abs: 3 10*3/uL (ref 1.7–7.7)
Neutrophils Relative %: 52 % (ref 43–77)
RBC: 4.64 MIL/uL (ref 4.22–5.81)
RDW: 14 % (ref 11.5–15.5)
WBC: 5.8 10*3/uL (ref 4.0–10.5)

## 2016-02-04 LAB — BASIC METABOLIC PANEL WITH GFR
BUN: 9 mg/dL (ref 7–25)
CO2: 27 mmol/L (ref 20–31)
Calcium: 8.9 mg/dL (ref 8.6–10.3)
Chloride: 105 mmol/L (ref 98–110)
Creat: 1.01 mg/dL (ref 0.60–1.35)
GFR, Est African American: >89 mL/min (ref >=60)
GFR, Est Non African American: 89 mL/min (ref >=60)
Glucose, Bld: 95 mg/dL (ref 65–99)
Potassium: 4 mmol/L (ref 3.5–5.3)
Sodium: 140 mmol/L (ref 135–146)

## 2016-02-04 LAB — MAGNESIUM: Magnesium: 2 mg/dL (ref 1.5–2.5)

## 2016-02-04 LAB — LIPID PANEL
Cholesterol: 135 mg/dL (ref 125–200)
HDL: 37 mg/dL — ABNORMAL LOW (ref >=40)
LDL Cholesterol: 53 mg/dL (ref <130)
Total CHOL/HDL Ratio: 3.6 Ratio (ref <=5.0)
Triglycerides: 225 mg/dL — ABNORMAL HIGH (ref <150)
VLDL: 45 mg/dL — ABNORMAL HIGH (ref <30)

## 2016-02-04 LAB — HEPATIC FUNCTION PANEL
ALT: 35 U/L (ref 9–46)
AST: 23 U/L (ref 10–40)
Albumin: 4.1 g/dL (ref 3.6–5.1)
Alkaline Phosphatase: 61 U/L (ref 40–115)
Bilirubin, Direct: 0.1 mg/dL (ref <=0.2)
Indirect Bilirubin: 0.4 mg/dL (ref 0.2–1.2)
Total Bilirubin: 0.5 mg/dL (ref 0.2–1.2)
Total Protein: 6.4 g/dL (ref 6.1–8.1)

## 2016-02-04 LAB — IRON AND TIBC
%SAT: 27 % (ref 15–60)
Iron: 85 ug/dL (ref 50–180)
TIBC: 315 ug/dL (ref 250–425)
UIBC: 230 ug/dL (ref 125–400)

## 2016-02-04 LAB — HEMOGLOBIN A1C
Hgb A1c MFr Bld: 5.5 % (ref <5.7)
Mean Plasma Glucose: 111 mg/dL (ref <117)

## 2016-02-04 LAB — VITAMIN B12: Vitamin B-12: 428 pg/mL (ref 200–1100)

## 2016-02-04 LAB — TSH: TSH: 3.2 mIU/L (ref 0.40–4.50)

## 2016-02-04 MED ORDER — TAMSULOSIN HCL 0.4 MG PO CAPS: ORAL_CAPSULE | ORAL | Status: DC

## 2016-02-04 MED ORDER — SERTRALINE HCL 100 MG PO TABS: ORAL_TABLET | ORAL | Status: DC

## 2016-02-05 LAB — MICROALBUMIN / CREATININE URINE RATIO
Creatinine, Urine: 230 mg/dL (ref 20–370)
Microalb Creat Ratio: 3 mcg/mg creat (ref <30)
Microalb, Ur: 0.6 mg/dL

## 2016-02-05 LAB — URINALYSIS, ROUTINE W REFLEX MICROSCOPIC
Bilirubin Urine: NEGATIVE
Glucose, UA: NEGATIVE
Hgb urine dipstick: NEGATIVE
Ketones, ur: NEGATIVE
Leukocytes, UA: NEGATIVE
Nitrite: NEGATIVE
Protein, ur: NEGATIVE
Specific Gravity, Urine: 1.023 (ref 1.001–1.035)
pH: 5.5 (ref 5.0–8.0)

## 2016-02-05 LAB — TESTOSTERONE: Testosterone: 466 ng/dL (ref 250–827)

## 2016-02-05 LAB — INSULIN, RANDOM: Insulin: 50.9 u[IU]/mL — ABNORMAL HIGH (ref 2.0–19.6)

## 2016-02-05 LAB — PSA: PSA: 0.72 ng/mL (ref <=4.00)

## 2016-02-05 LAB — VITAMIN D 25 HYDROXY (VIT D DEFICIENCY, FRACTURES): Vit D, 25-Hydroxy: 32 ng/mL (ref 30–100)

## 2016-02-29 ENCOUNTER — Encounter: Payer: Self-pay | Admitting: Internal Medicine

## 2016-02-29 ENCOUNTER — Ambulatory Visit (INDEPENDENT_AMBULATORY_CARE_PROVIDER_SITE_OTHER): Payer: 59 | Admitting: Internal Medicine

## 2016-02-29 VITALS — BP 106/68 | HR 84 | Temp 98.6°F | Resp 18 | Ht 70.0 in | Wt 258.0 lb

## 2016-02-29 DIAGNOSIS — J069 Acute upper respiratory infection, unspecified: Secondary | ICD-10-CM | POA: Diagnosis not present

## 2016-02-29 MED ORDER — AZITHROMYCIN 250 MG PO TABS: ORAL_TABLET | ORAL | Status: DC

## 2016-02-29 MED ORDER — PROMETHAZINE-DM 6.25-15 MG/5ML PO SYRP: ORAL_SOLUTION | ORAL | Status: DC

## 2016-02-29 MED ORDER — AZELASTINE HCL 0.1 % NA SOLN: 2.0000 | Freq: Two times a day (BID) | NASAL | Status: DC

## 2016-02-29 MED ORDER — MONTELUKAST SODIUM 10 MG PO TABS: 10.0000 mg | ORAL_TABLET | Freq: Every day | ORAL | Status: DC

## 2016-02-29 NOTE — Progress Notes (Signed)
HPI  Patient presents to the office for evaluation of cough and congestion.  It has been going on for 3 days.  Patient reports night > day, dry, barky, worse with lying down.  They also endorse change in voice, postnasal drip and nasal congestion,R ear pain, rhinorrhea clear, sore throat, itchy watery eyes.  .  They have tried antihistamines or mucinex.  They report that nothing has worked.  They denies other sick contacts.  Review of Systems  Constitutional: Positive for chills. Negative for fever and malaise/fatigue.  HENT: Positive for congestion, ear pain and sore throat.   Respiratory: Positive for cough. Negative for sputum production, shortness of breath and wheezing.   Cardiovascular: Negative for chest pain, palpitations and leg swelling.  Neurological: Positive for headaches.    PE:  Filed Vitals:   02/29/16 0921  BP: 106/68  Pulse: 84  Temp: 98.6 F (37 C)  Resp: 18    General:  Alert and non-toxic, WDWN, NAD HEENT: NCAT, PERLA, EOM normal, no occular discharge or erythema.  Nasal mucosal edema with sinus tenderness to palpation.  Oropharynx clear with minimal oropharyngeal edema and erythema.  Mucous membranes moist and pink. Neck:  Cervical adenopathy Chest:  RRR no MRGs.  Lungs clear to auscultation A&P with no wheezes rhonchi or rales.   Abdomen: +BS x 4 quadrants, soft, non-tender, no guarding, rigidity, or rebound. Skin: warm and dry no rash Neuro: A&Ox4, CN II-XII grossly intact  Assessment and Plan:   1. Acute URI -nasal saline -cont zyrtec - promethazine-dextromethorphan (PROMETHAZINE-DM) 6.25-15 MG/5ML syrup; Take 5-10 mL PO q8hrs prn for cold symptoms.  Dispense: 360 mL; Refill: 1 - azelastine (ASTELIN) 0.1 % nasal spray; Place 2 sprays into both nostrils 2 (two) times daily. Use in each nostril as directed  Dispense: 30 mL; Refill: 2 - montelukast (SINGULAIR) 10 MG tablet; Take 1 tablet (10 mg total) by mouth daily.  Dispense: 30 tablet; Refill: 2 -  azithromycin (ZITHROMAX Z-PAK) 250 MG tablet; 2 po day one, then 1 daily x 4 days  Dispense: 6 tablet; Refill: 0

## 2016-03-30 ENCOUNTER — Other Ambulatory Visit: Payer: Self-pay | Admitting: Internal Medicine

## 2016-05-01 ENCOUNTER — Other Ambulatory Visit: Payer: Self-pay | Admitting: Internal Medicine

## 2016-05-06 ENCOUNTER — Encounter: Payer: Self-pay | Admitting: Internal Medicine

## 2016-05-06 ENCOUNTER — Ambulatory Visit (INDEPENDENT_AMBULATORY_CARE_PROVIDER_SITE_OTHER): Payer: 59 | Admitting: Internal Medicine

## 2016-05-06 VITALS — BP 122/70 | HR 84 | Temp 98.2°F | Resp 16 | Ht 70.0 in | Wt 260.0 lb

## 2016-05-06 DIAGNOSIS — Z79899 Other long term (current) drug therapy: Secondary | ICD-10-CM | POA: Diagnosis not present

## 2016-05-06 DIAGNOSIS — I10 Essential (primary) hypertension: Secondary | ICD-10-CM

## 2016-05-06 DIAGNOSIS — E559 Vitamin D deficiency, unspecified: Secondary | ICD-10-CM | POA: Diagnosis not present

## 2016-05-06 DIAGNOSIS — R7303 Prediabetes: Secondary | ICD-10-CM

## 2016-05-06 DIAGNOSIS — F321 Major depressive disorder, single episode, moderate: Secondary | ICD-10-CM

## 2016-05-06 DIAGNOSIS — E785 Hyperlipidemia, unspecified: Secondary | ICD-10-CM | POA: Diagnosis not present

## 2016-05-06 MED ORDER — PHENTERMINE HCL 37.5 MG PO TABS: 37.5000 mg | ORAL_TABLET | Freq: Every day | ORAL | Status: DC

## 2016-05-06 NOTE — Patient Instructions (Signed)
Phentermine sustained-release capsules What is this medicine? PHENTERMINE (FEN ter meen) decreases your appetite. It is used with a reduced calorie diet and exercise to help you lose weight. This medicine may be used for other purposes; ask your health care provider or pharmacist if you have questions. What should I tell my health care provider before I take this medicine? They need to know if you have any of these conditions: -agitation -glaucoma -heart disease -high blood pressure -history of substance abuse -lung disease called Primary Pulmonary Hypertension (PPH) -taken an MAOI like Carbex, Eldepryl, Marplan, Nardil, or Parnate in last 14 days -thyroid disease -an unusual or allergic reaction to phentermine, other medicines, foods, dyes, or preservatives -pregnant or trying to get pregnant -breast-feeding How should I use this medicine? Take this medicine by mouth with a glass of water. Follow the directions on the prescription label. This medicine is usually taken before breakfast or at least 10 to 14 hours before going to bed. Avoid taking this medicine in the evening. It may interfere with sleep. Swallow whole. Do not open or chew the capsules. Take your doses at regular intervals. Do not take your medicine more often than directed. Talk to your pediatrician regarding the use of this medicine in children. Special care may be needed. Overdosage: If you think you have taken too much of this medicine contact a poison control center or emergency room at once. NOTE: This medicine is only for you. Do not share this medicine with others. What if I miss a dose? If you miss a dose, take it as soon as you can. If it is almost time for your next dose, take only that dose. Do not take double or extra doses. What may interact with this medicine? Do not take this medicine with any of the following medications: -duloxetine -MAOIs like Carbex, Eldepryl, Marplan, Nardil, and Parnate -medicines for  colds or breathing difficulties like pseudoephedrine or phenylephrine -procarbazine -sibutramine -SSRIs like citalopram, escitalopram, fluoxetine, fluvoxamine, paroxetine, and sertraline -stimulants like dexmethylphenidate, methylphenidate or modafinil -venlafaxine This medicine may also interact with the following medications: -medicines for diabetes This list may not describe all possible interactions. Give your health care provider a list of all the medicines, herbs, non-prescription drugs, or dietary supplements you use. Also tell them if you smoke, drink alcohol, or use illegal drugs. Some items may interact with your medicine. What should I watch for while using this medicine? Notify your physician immediately if you become short of breath while doing your normal activities. Do not take this medicine within 6 hours of bedtime. It can keep you from getting to sleep. Avoid drinks that contain caffeine and try to stick to a regular bedtime every night. This medicine was intended to be used in addition to a healthy diet and exercise. The best results are achieved this way. This medicine is only indicated for short-term use. Eventually your weight loss may level out. At that point, the drug will only help you maintain your new weight. Do not increase or in any way change your dose without consulting your doctor. You may get drowsy or dizzy. Do not drive, use machinery, or do anything that needs mental alertness until you know how this medicine affects you. Do not stand or sit up quickly, especially if you are an older patient. This reduces the risk of dizzy or fainting spells. Alcohol may increase dizziness and drowsiness. Avoid alcoholic drinks. What side effects may I notice from receiving this medicine? Side effects that you  should report to your doctor or health care professional as soon as possible: -chest pain, palpitations -depression or severe changes in mood -increased blood  pressure -irritability -nervousness or restlessness -severe dizziness -shortness of breath -problems urinating -unusual swelling of the legs -vomiting Side effects that usually do not require medical attention (report to your doctor or health care professional if they continue or are bothersome): -blurred vision or other eye problems -changes in sexual ability or desire -constipation or diarrhea -difficulty sleeping -dry mouth or unpleasant taste -headache -nausea This list may not describe all possible side effects. Call your doctor for medical advice about side effects. You may report side effects to FDA at 1-800-FDA-1088. Where should I keep my medicine? Keep out of the reach of children. This medicine can be abused. Keep your medicine in a safe place to protect it from theft. Do not share this medicine with anyone. Selling or giving away this medicine is dangerous and against the law. This medicine may cause accidental overdose and death if taken by other adults, children, or pets. Mix any unused medicine with a substance like cat litter or coffee grounds. Then throw the medicine away in a sealed container like a sealed bag or a coffee can with a lid. Do not use the medicine after the expiration date. Store at room temperature between 20 and 25 degrees C (68 and 77 degrees F). Keep container tightly closed. NOTE: This sheet is a summary. It may not cover all possible information. If you have questions about this medicine, talk to your doctor, pharmacist, or health care provider.    2016, [employer]. (2014-09-05 16:19:17)

## 2016-05-06 NOTE — Progress Notes (Signed)
Assessment and Plan:  Hypertension:  -Continue medication -monitor blood pressure at home. -Continue DASH diet -Reminder to go to the ER if any CP, SOB, nausea, dizziness, severe HA, changes vision/speech, left arm numbness and tingling and jaw pain.  Cholesterol - Continue diet and exercise -Check cholesterol.   PreDiabetes without complications -Continue diet and exercise.  -Check A1C  Vitamin D Def -check level -continue medications.   Severe Depression -referral to psychiatry -no med changes today  Continue diet and meds as discussed. Further disposition pending results of labs. Discussed med's effects and SE's.    HPI 45 y.o. male  presents for 3 month follow up with hypertension, hyperlipidemia, diabetes and vitamin D deficiency.   His blood pressure has been controlled at home, today their BP is BP: 122/70 mmHg.He does not workout. He denies chest pain, shortness of breath, dizziness.   He is not on cholesterol medication and denies myalgias. His cholesterol is at goal. The cholesterol was:  02/04/2016: Cholesterol 135; HDL 37*; LDL Cholesterol 53; Triglycerides 225*   He has been working on diet and exercise for diabetes without complications, he is not on bASA, he is not on ACE/ARB, and denies  foot ulcerations, hyperglycemia, hypoglycemia , increased appetite, nausea, paresthesia of the feet, polydipsia, polyuria, visual disturbances, vomiting and weight loss. Last A1C was: 02/04/2016: Hgb A1c MFr Bld 5.5   Patient is on Vitamin D supplement. 02/04/2016: Vit D, 25-Hydroxy 32  Patient reports that he is having right toe pain.  He reports that he knocked this a couple weeks ago.  He reports that it has been swollen and painful.    He reports that he has not been sleeping well despite using his mask.  He reports that he is tired all the time.  He reports that he has been taking xanax at night time.  He reports that when he wakes up he feels like he is worn out.  Sometimes he  feels fine though.  He feels like his mask is fitting well, he doesn't think that it is leaking at well.    He reports that he had some clenching of his jaw.  He reports that he thinks that it comes from the wellbutrin.  He reports that he didn't feel like this helped his mood a lot.  He is not sure that he has tried anything that has worked well for him.    Current Medications:  Current Outpatient Prescriptions on File Prior to Visit  Medication Sig Dispense Refill  . atorvastatin (LIPITOR) 80 MG tablet TAKE 1 TABLET EVERY DAY 90 tablet 1  . montelukast (SINGULAIR) 10 MG tablet Take 1 tablet (10 mg total) by mouth daily. 30 tablet 2  . sertraline (ZOLOFT) 100 MG tablet Take 1 tablet daily 90 tablet 1  . tamsulosin (FLOMAX) 0.4 MG CAPS capsule Take 1 capsule at Bedtime for Prostate 90 capsule 1   No current facility-administered medications on file prior to visit.   Medical History:  Past Medical History  Diagnosis Date  . Hypertension   . Hyperlipidemia   . Prediabetes   . Obesity (BMI 30-39.9)   . Hypogonadism male   . Vitamin D deficiency   . OSA on CPAP    Allergies:  Allergies  Allergen Reactions  . Codeine Other (See Comments)    unknown  . Effexor [Venlafaxine]   . Pravastatin   . Prednisone     Decrease sleep  . Tramadol Hcl Itching  . Zinc  Review of Systems:  Review of Systems  Constitutional: Positive for malaise/fatigue. Negative for fever and chills.  HENT: Negative for congestion, ear pain and sore throat.   Eyes: Negative.   Respiratory: Negative for cough, shortness of breath and wheezing.   Cardiovascular: Negative for chest pain, palpitations and leg swelling.  Gastrointestinal: Negative for heartburn, abdominal pain, diarrhea, constipation, blood in stool and melena.  Genitourinary: Negative.   Skin: Negative.   Neurological: Negative for dizziness, sensory change, loss of consciousness and headaches.  Psychiatric/Behavioral: Positive for  depression. The patient is nervous/anxious and has insomnia.     Family history- Review and unchanged  Social history- Review and unchanged  Physical Exam: BP 122/70 mmHg  Pulse 84  Temp(Src) 98.2 F (36.8 C) (Temporal)  Resp 16  Ht 5\' 10"  (1.778 m)  Wt 260 lb (117.935 kg)  BMI 37.31 kg/m2 Wt Readings from Last 3 Encounters:  05/06/16 260 lb (117.935 kg)  02/29/16 258 lb (117.028 kg)  02/04/16 264 lb (119.75 kg)   General Appearance: Well nourished well developed, non-toxic appearing, in no apparent distress. Eyes: PERRLA, EOMs, conjunctiva no swelling or erythema ENT/Mouth: Ear canals clear with no erythema, swelling, or discharge.  TMs normal bilaterally, oropharynx clear, moist, with no exudate.   Neck: Supple, thyroid normal, no JVD, no cervical adenopathy.  Respiratory: Respiratory effort normal, breath sounds clear A&P, no wheeze, rhonchi or rales noted.  No retractions, no accessory muscle usage Cardio: RRR with no MRGs. No noted edema.  Abdomen: Soft, + BS.  Non tender, no guarding, rebound, hernias, masses. Musculoskeletal: Full ROM, 5/5 strength, Normal gait Skin: Warm, dry without rashes, lesions, ecchymosis.  Neuro: Awake and oriented X 3, Cranial nerves intact. No cerebellar symptoms.  Psych: flat affect, Insight and Judgment appropriate.    Terri Piedraourtney Forcucci, PA-C 3:59 PM [facility]

## 2016-05-07 LAB — LIPID PANEL
Cholesterol: 114 mg/dL — ABNORMAL LOW (ref 125–200)
HDL: 34 mg/dL — ABNORMAL LOW (ref >=40)
LDL Cholesterol: 54 mg/dL (ref <130)
Total CHOL/HDL Ratio: 3.4 Ratio (ref <=5.0)
Triglycerides: 129 mg/dL (ref <150)
VLDL: 26 mg/dL (ref <30)

## 2016-05-07 LAB — CBC WITH DIFFERENTIAL/PLATELET
Basophils Absolute: 80 cells/uL (ref 0–200)
Basophils Relative: 1 %
Eosinophils Absolute: 400 cells/uL (ref 15–500)
Eosinophils Relative: 5 %
HCT: 44 % (ref 38.5–50.0)
Hemoglobin: 15.2 g/dL (ref 13.2–17.1)
Lymphocytes Relative: 22 %
Lymphs Abs: 1760 cells/uL (ref 850–3900)
MCH: 31.3 pg (ref 27.0–33.0)
MCHC: 34.5 g/dL (ref 32.0–36.0)
MCV: 90.7 fL (ref 80.0–100.0)
MPV: 10.8 fL (ref 7.5–12.5)
Monocytes Absolute: 880 cells/uL (ref 200–950)
Monocytes Relative: 11 %
Neutro Abs: 4880 cells/uL (ref 1500–7800)
Neutrophils Relative %: 61 %
RBC: 4.85 MIL/uL (ref 4.20–5.80)
RDW: 13.7 % (ref 11.0–15.0)
WBC: 8 10*3/uL (ref 3.8–10.8)

## 2016-05-07 LAB — HEPATIC FUNCTION PANEL
ALT: 29 U/L (ref 9–46)
AST: 19 U/L (ref 10–40)
Albumin: 4.3 g/dL (ref 3.6–5.1)
Alkaline Phosphatase: 63 U/L (ref 40–115)
Bilirubin, Direct: 0.1 mg/dL (ref <=0.2)
Indirect Bilirubin: 0.6 mg/dL (ref 0.2–1.2)
Total Bilirubin: 0.7 mg/dL (ref 0.2–1.2)
Total Protein: 6.7 g/dL (ref 6.1–8.1)

## 2016-05-07 LAB — BASIC METABOLIC PANEL WITH GFR
BUN: 12 mg/dL (ref 7–25)
CO2: 24 mmol/L (ref 20–31)
Calcium: 9.1 mg/dL (ref 8.6–10.3)
Chloride: 105 mmol/L (ref 98–110)
Creat: 1.07 mg/dL (ref 0.60–1.35)
GFR, Est African American: >89 mL/min (ref >=60)
GFR, Est Non African American: 83 mL/min (ref >=60)
Glucose, Bld: 99 mg/dL (ref 65–99)
Potassium: 3.9 mmol/L (ref 3.5–5.3)
Sodium: 139 mmol/L (ref 135–146)

## 2016-05-07 LAB — HEMOGLOBIN A1C
Hgb A1c MFr Bld: 5.4 % (ref <5.7)
Mean Plasma Glucose: 108 mg/dL

## 2016-05-07 LAB — TSH: TSH: 1.96 mIU/L (ref 0.40–4.50)

## 2016-05-24 ENCOUNTER — Other Ambulatory Visit: Payer: Self-pay | Admitting: Internal Medicine

## 2016-07-07 ENCOUNTER — Encounter (HOSPITAL_COMMUNITY): Payer: Self-pay | Admitting: *Deleted

## 2016-07-07 DIAGNOSIS — I10 Essential (primary) hypertension: Secondary | ICD-10-CM | POA: Diagnosis not present

## 2016-07-07 DIAGNOSIS — N201 Calculus of ureter: Secondary | ICD-10-CM | POA: Diagnosis not present

## 2016-07-07 DIAGNOSIS — Z87891 Personal history of nicotine dependence: Secondary | ICD-10-CM | POA: Insufficient documentation

## 2016-07-07 DIAGNOSIS — R109 Unspecified abdominal pain: Secondary | ICD-10-CM | POA: Diagnosis present

## 2016-07-07 LAB — URINALYSIS, ROUTINE W REFLEX MICROSCOPIC
Bilirubin Urine: NEGATIVE
Glucose, UA: NEGATIVE mg/dL
Ketones, ur: 15 mg/dL — AB
Nitrite: NEGATIVE
Protein, ur: 30 mg/dL — AB
Specific Gravity, Urine: 1.028 (ref 1.005–1.030)
pH: 5.5 (ref 5.0–8.0)

## 2016-07-07 LAB — BASIC METABOLIC PANEL
Anion gap: 6 (ref 5–15)
BUN: 13 mg/dL (ref 6–20)
CO2: 29 mmol/L (ref 22–32)
Calcium: 9.4 mg/dL (ref 8.9–10.3)
Chloride: 104 mmol/L (ref 101–111)
Creatinine, Ser: 1.2 mg/dL (ref 0.61–1.24)
GFR calc Af Amer: >60 mL/min (ref >60)
GFR calc non Af Amer: >60 mL/min (ref >60)
Glucose, Bld: 91 mg/dL (ref 65–99)
Potassium: 3.4 mmol/L — ABNORMAL LOW (ref 3.5–5.1)
Sodium: 139 mmol/L (ref 135–145)

## 2016-07-07 LAB — URINE MICROSCOPIC-ADD ON

## 2016-07-07 NOTE — ED Notes (Signed)
Pt c/o right flank pain with nausea, onset 45 minutes ago. Pt denies dysuria, hematuria, diarrhea.

## 2016-07-08 ENCOUNTER — Emergency Department (HOSPITAL_COMMUNITY)
Admission: EM | Admit: 2016-07-08 | Discharge: 2016-07-08 | Disposition: A | Discharge status: Home / Self Care | Payer: 59 | Attending: Emergency Medicine | Admitting: Emergency Medicine

## 2016-07-08 ENCOUNTER — Emergency Department (HOSPITAL_COMMUNITY): Payer: 59

## 2016-07-08 DIAGNOSIS — N200 Calculus of kidney: Secondary | ICD-10-CM

## 2016-07-08 DIAGNOSIS — R109 Unspecified abdominal pain: Secondary | ICD-10-CM

## 2016-07-08 LAB — CBC
HCT: 43 % (ref 39.0–52.0)
Hemoglobin: 15.2 g/dL (ref 13.0–17.0)
MCH: 31.9 pg (ref 26.0–34.0)
MCHC: 35.3 g/dL (ref 30.0–36.0)
MCV: 90.1 fL (ref 78.0–100.0)
Platelets: UNDETERMINED 10*3/uL (ref 150–400)
RBC: 4.77 MIL/uL (ref 4.22–5.81)
RDW: 12.8 % (ref 11.5–15.5)
WBC: 8.5 10*3/uL (ref 4.0–10.5)

## 2016-07-08 MED ORDER — CEPHALEXIN 500 MG PO CAPS: 500.0000 mg | ORAL_CAPSULE | Freq: Two times a day (BID) | ORAL | Status: DC

## 2016-07-08 MED ORDER — HYDROCODONE-ACETAMINOPHEN 5-325 MG PO TABS: 1.0000 | ORAL_TABLET | Freq: Two times a day (BID) | ORAL | Status: DC | PRN

## 2016-07-08 MED ORDER — CEPHALEXIN 250 MG PO CAPS
500.0000 mg | ORAL_CAPSULE | Freq: Once | ORAL | Status: AC
  Administered 2016-07-08: 500 mg via ORAL
  Filled 2016-07-08: qty 2

## 2016-07-08 NOTE — Discharge Instructions (Signed)
Dietary Guidelines to Help Prevent Kidney Stones [name], read below on diet changes to help prevent kidney stones.  See urology within 3 days for close follow up.  Take ibuprofen if symptoms return, take norco if the pain is severe.  Complete antibiotics for the bacteria in your urine.  Come back to the ED immediately for any worsening symptoms. Thank you. Your risk of kidney stones can be decreased by adjusting the foods you eat. The most important thing you can do is drink enough fluid. You should drink enough fluid to keep your urine clear or pale yellow. The following guidelines provide specific information for the type of kidney stone you have had. GUIDELINES ACCORDING TO TYPE OF KIDNEY STONE Calcium Oxalate Kidney Stones  Reduce the amount of salt you eat. Foods that have a lot of salt cause your body to release excess calcium into your urine. The excess calcium can combine with a substance called oxalate to form kidney stones.  Reduce the amount of animal protein you eat if the amount you eat is excessive. Animal protein causes your body to release excess calcium into your urine. Ask your dietitian how much protein from animal sources you should be eating.  Avoid foods that are high in oxalates. If you take vitamins, they should have less than 500 mg of vitamin C. Your body turns vitamin C into oxalates. You do not need to avoid fruits and vegetables high in vitamin C. Calcium Phosphate Kidney Stones  Reduce the amount of salt you eat to help prevent the release of excess calcium into your urine.  Reduce the amount of animal protein you eat if the amount you eat is excessive. Animal protein causes your body to release excess calcium into your urine. Ask your dietitian how much protein from animal sources you should be eating.  Get enough calcium from food or take a calcium supplement (ask your dietitian for recommendations). Food sources of calcium that do not increase your risk of  kidney stones include:  Broccoli.  Dairy products, such as cheese and yogurt.  Pudding. Uric Acid Kidney Stones  Do not have more than 6 oz of animal protein per day. FOOD SOURCES Animal Protein Sources  Meat (all types).  Poultry.  Eggs.  Fish, seafood. Foods High in MirantSalt  Salt seasonings.  Soy sauce.  Teriyaki sauce.  Cured and processed meats.  Salted crackers and snack foods.  Fast food.  Canned soups and most canned foods. Foods High in Oxalates  Grains:  Amaranth.  Barley.  Grits.  Wheat germ.  Bran.  Buckwheat flour.  All bran cereals.  Pretzels.  Whole wheat bread.  Vegetables:  Beans (wax).  Beets and beet greens.  Collard greens.  Eggplant.  Escarole.  Leeks.  Okra.  Parsley.  Rutabagas.  Spinach.  Swiss chard.  Tomato paste.  Fried potatoes.  Sweet potatoes.  Fruits:  Red currants.  Figs.  Kiwi.  Rhubarb.  Meat and Other Protein Sources:  Beans (dried).  Soy burgers and other soybean products.  Miso.  Nuts (peanuts, almonds, pecans, cashews, hazelnuts).  Nut butters.  Sesame seeds and tahini (paste made of sesame seeds).  Poppy seeds.  Beverages:  Chocolate drink mixes.  Soy milk.  Instant iced tea.  Juices made from high-oxalate fruits or vegetables.  Other:  Carob.  Chocolate.  Fruitcake.  Marmalades.   This information is not intended to replace advice given to you by your health care provider. Make sure you discuss any questions  you have with your health care provider.   Document Released: 04/11/2011 Document Revised: 12/20/2013 Document Reviewed: 11/11/2013 Elsevier Interactive Patient Education 2016 Else[employer] Inc. Kidney Stones Kidney stones (urolithiasis) are solid masses that form inside your kidneys. The intense pain is caused by the stone moving through the kidney, ureter, bladder, and urethra (urinary tract). When the stone moves, the ureter starts to spasm  around the stone. The stone is usually passed in your pee (urine).  HOME CARE  Drink enough fluids to keep your pee clear or pale yellow. This helps to get the stone out.  Take a 24-hour pee (urine) sample as told by your doctor. You may need to take another sample every 6-12 months.  Strain all pee through the provided strainer. Do not pee without peeing through the strainer, not even once. If you pee the stone out, catch it in the strainer. The stone may be as small as a grain of salt. Take this to your doctor. This will help your doctor figure out what you can do to try to prevent more kidney stones.  Only take medicine as told by your doctor.  Make changes to your daily diet as told by your doctor. You may be told to:  Limit how much salt you eat.  Eat 5 or more servings of fruits and vegetables each day.  Limit how much meat, poultry, fish, and eggs you eat.  Keep all follow-up visits as told by your doctor. This is important.  Get follow-up X-rays as told by your doctor. GET HELP IF: You have pain that gets worse even if you have been taking pain medicine. GET HELP RIGHT AWAY IF:   Your pain does not get better with medicine.  You have a fever or shaking chills.  Your pain increases and gets worse over 18 hours.  You have new belly (abdominal) pain.  You feel faint or pass out.  You are unable to pee.   This information is not intended to replace advice given to you by your health care provider. Make sure you discuss any questions you have with your health care provider.   Document Released: 06/02/2008 Document Revised: 09/05/2015 Document Reviewed: 05/18/2013 Elsevier Interactive Patient Education [employer]

## 2016-07-08 NOTE — ED Provider Notes (Signed)
CSN: [id number]     Arrival date & time 07/07/16  2303 History  By signing my name below, I, Bethel Born, attest that this documentation has been prepared under the direction and in the presence of Tomasita Crumble, MD. Electronically Signed: Bethel Born, ED Scribe. 07/08/2016. 3:02 AM   Chief Complaint  Patient presents with  . Flank Pain   The history is provided by the patient. No language interpreter was used.   Shane Saunders is a 45 y.o. male who presents to the Emergency Department complaining of new, constant, spasming/sharp, right flank pain with onset 45 minutes PTA. He took nothing for pain at home but notes that the pain has improved since initial onset. He rates the pain 4/10 in severity at present. Associated symptoms include nausea and diarrhea. Pt denies fever, vomiting, dysuria, and hematuria. He has no history of kidney stones.   Past Medical History  Diagnosis Date  . Hypertension   . Hyperlipidemia   . Prediabetes   . Obesity (BMI 30-39.9)   . Hypogonadism male   . Vitamin D deficiency   . OSA on CPAP    Past Surgical History  Procedure Laterality Date  . Vasectomy    . Abdominal exploration surgery  1998    Celiotomy  . Appendectomy  1998    during exploratory lap  . Arm wound repair / closure Right 05/31/2014    irrigation and exploration/notes 05/31/2014  . Abdominal exploration surgery  1990's    "appendix was on the wrong side; colon twisted"  . Appendectomy  1990's  . Colon surgery  1990's    "colon twisted and put back in place; didn't take any of it out"  . Artery repair Right 05/31/2014    Procedure: IRRIGATION, EXPLORATION, AND REPAIR OF RIGHT ARM WOUND;  Surgeon: Cherylynn Ridges, MD;  Location: [facility] OR;  Service: General;  Laterality: Right;   Family History  Problem Relation Age of Onset  . Hypertension Mother   . Migraines Mother   . Thyroid disease Mother   . Hypertension Father   . Hyperlipidemia Father   . Diabetes Father   . Cancer  Sister     thyroid   Social History  Substance Use Topics  . Smoking status: Former Smoker -- 0.50 packs/day for 10 years    Types: Cigarettes    Quit date: 10/27/2004  . Smokeless tobacco: Current User    Types: Snuff, Chew     Comment: "stopped smoking in ~ 2010"  . Alcohol Use: Yes     Comment: rarely    Review of Systems  10 Systems reviewed and all are negative for acute change except as noted in the HPI.  Allergies  Codeine; Effexor; Pravastatin; Prednisone; Tramadol hcl; and Zinc  Home Medications   Prior to Admission medications   Medication Sig Start Date End Date Taking? Authorizing Provider  atorvastatin (LIPITOR) 80 MG tablet TAKE 1 TABLET EVERY DAY 10/28/15   [name], MD  montelukast (SINGULAIR) 10 MG tablet TAKE 1 TABLET BY MOUTH EVERY DAY 05/24/16   [name], MD  phentermine (ADIPEX-P) 37.5 MG tablet Take 1 tablet (37.5 mg total) by mouth daily before breakfast. 05/06/16   Terri Piedra, PA-C  sertraline (ZOLOFT) 100 MG tablet Take 1 tablet daily 02/04/16   [name], MD  tamsulosin [facility]) 0.4 MG CAPS capsule Take 1 capsule at Bedtime for Prostate 02/04/16 08/03/16  [name], MD   BP 144/87 mmHg  Pulse 64  Temp(Src) 98.3  F (36.8 C) (Oral)  Resp 18  Ht 5\' 10"  (1.778 m)  Wt 240 lb (108.863 kg)  BMI 34.44 kg/m2  SpO2 98% Physical Exam  Constitutional: He is oriented to person, place, and time. Vital signs are normal. He appears well-developed and well-nourished.  Non-toxic appearance. He does not appear ill. No distress.  HENT:  Head: Normocephalic and atraumatic.  Nose: Nose normal.  Mouth/Throat: Oropharynx is clear and moist. No oropharyngeal exudate.  Eyes: Conjunctivae and EOM are normal. Pupils are equal, round, and reactive to light. No scleral icterus.  Neck: Normal range of motion. Neck supple. No tracheal deviation, no edema, no erythema and normal range of motion present. No thyroid mass and no thyromegaly present.   Cardiovascular: Normal rate, regular rhythm, S1 normal, S2 normal, normal heart sounds, intact distal pulses and normal pulses.  Exam reveals no gallop and no friction rub.   No murmur heard. Pulmonary/Chest: Effort normal and breath sounds normal. No respiratory distress. He has no wheezes. He has no rhonchi. He has no rales.  Abdominal: Soft. Normal appearance and bowel sounds are normal. He exhibits no distension, no ascites and no mass. There is no hepatosplenomegaly. There is no tenderness. There is no rebound, no guarding and no CVA tenderness.  Mid line abdominal scaring, well healed.   Musculoskeletal: Normal range of motion. He exhibits no edema or tenderness.  Lymphadenopathy:    He has no cervical adenopathy.  Neurological: He is alert and oriented to person, place, and time. He has normal strength. No cranial nerve deficit or sensory deficit.  Skin: Skin is warm, dry and intact. No petechiae and no rash noted. He is not diaphoretic. No erythema. No pallor.  Psychiatric: He has a normal mood and affect. His behavior is normal. Judgment normal.  Nursing note and vitals reviewed.   ED Course  Procedures (including critical care time) DIAGNOSTIC STUDIES: Oxygen Saturation is 98% on RA,  normal by my interpretation.    COORDINATION OF CARE: 2:49 AM Discussed treatment plan which includes lab work and CT renal stone with pt at bedside and pt agreed to plan. Pt declines pain medication and antiemetic medication at the time of initial assessment.   Labs Review Labs Reviewed  URINALYSIS, ROUTINE W REFLEX MICROSCOPIC (NOT AT [facility]) - Abnormal; Notable for the following:    Color, Urine AMBER (*)    APPearance TURBID (*)    Hgb urine dipstick LARGE (*)    Ketones, ur 15 (*)    Protein, ur 30 (*)    Leukocytes, UA TRACE (*)    All other components within normal limits  BASIC METABOLIC PANEL - Abnormal; Notable for the following:    Potassium 3.4 (*)    All other components within  normal limits  URINE MICROSCOPIC-ADD ON - Abnormal; Notable for the following:    Squamous Epithelial / LPF 6-30 (*)    Bacteria, UA MANY (*)    Crystals CA OXALATE CRYSTALS (*)    All other components within normal limits  CBC    Imaging Review Ct Renal Stone Study  07/08/2016  CLINICAL DATA:  45 year old male with right flank pain EXAM: CT ABDOMEN AND PELVIS WITHOUT CONTRAST TECHNIQUE: Multidetector CT imaging of the abdomen and pelvis was performed following the standard protocol without IV contrast. COMPARISON:  None. FINDINGS: Evaluation of this exam is limited in the absence of intravenous contrast. The visualized lung bases are clear. Minimal left lung base atelectatic changes noted. No intra-abdominal free air or  free fluid. Diffuse fatty infiltration of the liver. The gallbladder, pancreas, spleen, and the adrenal glands appear unremarkable. There is a 3 mm distal right ureteral stone approximately 3 cm from the right UVJ. There is no hydronephrosis. The left kidney, left ureter, and urinary bladder appear unremarkable. The prostate and seminal vesicles are grossly unremarkable. 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 The small bowel is located in the right hemipelvis and the colon is in the left hemipelvis. No evidence of bowel obstruction or volvulus. No active bowel inflammation. There is moderate stool throughout the colon. The cecum is located in the left lower quadrant. The appendix is not visualized with certainty. No inflammatory changes identified in the right lower quadrant. The abdominal aorta and IVC appear unremarkable on this noncontrast study. No portal venous gas identified. There is no adenopathy. There is a midline vertical anterior abdominal wall incisional scar. Small fat containing umbilical hernia. The abdominal wall soft tissues are otherwise unremarkable. The osseous structures are intact. IMPRESSION: A 3 mm distal right ureteral stone.  No hydronephrosis.  Congenital malrotation of the bowel. No evidence of volvulus or bowel obstruction. Electronically Signed   By: Elgie Collard M.D.   On: 07/08/2016 03:32   I have personally reviewed and evaluated these images and lab results as part of my medical decision-making.   EKG Interpretation None      MDM   Final diagnoses:  None    Patient presents to the ED for sudden onset flank pain. History consistent with nephrolithiasis and CT scan confirms this.  He has been pain free and is not requesting any medication in the ED.  Will give urology follow up, norco in case pain returns, and return precautions.  Education regarding kidney stones provided. He appears well and in NAD.  VS remain within his normal limits and he is safe for DC.   I personally performed the services described in this documentation, which was scribed in my presence. The recorded information has been reviewed and is accurate.     Tomasita Crumble, MD 07/08/16 [phone]

## 2016-07-10 ENCOUNTER — Other Ambulatory Visit: Payer: Self-pay | Admitting: Urology

## 2016-07-10 ENCOUNTER — Encounter (HOSPITAL_BASED_OUTPATIENT_CLINIC_OR_DEPARTMENT_OTHER): Payer: Self-pay | Admitting: *Deleted

## 2016-07-10 NOTE — Progress Notes (Signed)
NPO AFTER MN, PT VERBALIZED UNDERSTANDING THIS INCLUDES NO CHEW/DIP TOBACCO.   ARRIVE AT 0945.  CURRENT LAB RESULTS AND EKG IN CHART AND EPIC.  WILL TAKE AM MEDS W/ SIPS OF WATER AND IF NEEDED TAKE HYDROCODONE.

## 2016-07-11 ENCOUNTER — Ambulatory Visit (HOSPITAL_BASED_OUTPATIENT_CLINIC_OR_DEPARTMENT_OTHER): Payer: 59 | Admitting: Anesthesiology

## 2016-07-11 ENCOUNTER — Encounter (HOSPITAL_BASED_OUTPATIENT_CLINIC_OR_DEPARTMENT_OTHER): Payer: Self-pay | Admitting: *Deleted

## 2016-07-11 ENCOUNTER — Ambulatory Visit (HOSPITAL_BASED_OUTPATIENT_CLINIC_OR_DEPARTMENT_OTHER)
Admission: RE | Admit: 2016-07-11 | Discharge: 2016-07-11 | Disposition: A | Discharge status: Home / Self Care | Payer: 59 | Source: Ambulatory Visit | Attending: Urology | Admitting: Urology

## 2016-07-11 ENCOUNTER — Encounter (HOSPITAL_BASED_OUTPATIENT_CLINIC_OR_DEPARTMENT_OTHER)
Admission: RE | Disposition: A | Discharge status: Home / Self Care | Payer: Self-pay | Source: Ambulatory Visit | Attending: Urology

## 2016-07-11 DIAGNOSIS — Z6835 Body mass index (BMI) 35.0-35.9, adult: Secondary | ICD-10-CM | POA: Insufficient documentation

## 2016-07-11 DIAGNOSIS — N132 Hydronephrosis with renal and ureteral calculous obstruction: Secondary | ICD-10-CM | POA: Insufficient documentation

## 2016-07-11 DIAGNOSIS — G4733 Obstructive sleep apnea (adult) (pediatric): Secondary | ICD-10-CM | POA: Diagnosis not present

## 2016-07-11 DIAGNOSIS — F329 Major depressive disorder, single episode, unspecified: Secondary | ICD-10-CM | POA: Insufficient documentation

## 2016-07-11 DIAGNOSIS — Z87891 Personal history of nicotine dependence: Secondary | ICD-10-CM | POA: Insufficient documentation

## 2016-07-11 DIAGNOSIS — E785 Hyperlipidemia, unspecified: Secondary | ICD-10-CM | POA: Diagnosis not present

## 2016-07-11 DIAGNOSIS — F419 Anxiety disorder, unspecified: Secondary | ICD-10-CM | POA: Diagnosis not present

## 2016-07-11 DIAGNOSIS — Z79899 Other long term (current) drug therapy: Secondary | ICD-10-CM | POA: Insufficient documentation

## 2016-07-11 DIAGNOSIS — N400 Enlarged prostate without lower urinary tract symptoms: Secondary | ICD-10-CM | POA: Diagnosis not present

## 2016-07-11 HISTORY — DX: Elevated blood-pressure reading, without diagnosis of hypertension: R03.0

## 2016-07-11 HISTORY — DX: Congenital malformations of intestinal fixation: Q43.3

## 2016-07-11 HISTORY — PX: CYSTOSCOPY WITH RETROGRADE PYELOGRAM, URETEROSCOPY AND STENT PLACEMENT: SHX5789

## 2016-07-11 HISTORY — DX: Presence of spectacles and contact lenses: Z97.3

## 2016-07-11 HISTORY — DX: Benign prostatic hyperplasia without lower urinary tract symptoms: N40.0

## 2016-07-11 HISTORY — DX: Calculus of ureter: N20.1

## 2016-07-11 SURGERY — CYSTOURETEROSCOPY, WITH RETROGRADE PYELOGRAM AND STENT INSERTION
Anesthesia: General | Laterality: Right

## 2016-07-11 MED ORDER — DIATRIZOATE MEGLUMINE 30 % UR SOLN
URETHRAL | Status: DC | PRN
  Administered 2016-07-11: 2 mL via URETHRAL

## 2016-07-11 MED ORDER — DEXAMETHASONE SODIUM PHOSPHATE 4 MG/ML IJ SOLN
INTRAMUSCULAR | Status: DC | PRN
  Administered 2016-07-11: 5 mg via INTRAVENOUS

## 2016-07-11 MED ORDER — OXYCODONE HCL 5 MG PO TABS
5.0000 mg | ORAL_TABLET | Freq: Once | ORAL | Status: AC | PRN
  Administered 2016-07-11: 5 mg via ORAL
  Filled 2016-07-11: qty 1

## 2016-07-11 MED ORDER — FENTANYL CITRATE (PF) 100 MCG/2ML IJ SOLN
INTRAMUSCULAR | Status: DC | PRN
Start: 2016-07-11 — End: 2016-07-11
  Administered 2016-07-11 (×2): 50 ug via INTRAVENOUS

## 2016-07-11 MED ORDER — DEXAMETHASONE SODIUM PHOSPHATE 10 MG/ML IJ SOLN
INTRAMUSCULAR | Status: AC
  Filled 2016-07-11: qty 1

## 2016-07-11 MED ORDER — PROMETHAZINE HCL 25 MG/ML IJ SOLN
INTRAMUSCULAR | Status: AC
  Filled 2016-07-11: qty 1

## 2016-07-11 MED ORDER — GENTAMICIN IN SALINE 1.6-0.9 MG/ML-% IV SOLN
80.0000 mg | INTRAVENOUS | Status: DC
  Filled 2016-07-11: qty 50

## 2016-07-11 MED ORDER — PROPOFOL 500 MG/50ML IV EMUL
INTRAVENOUS | Status: AC
  Filled 2016-07-11: qty 50

## 2016-07-11 MED ORDER — KETOROLAC TROMETHAMINE 30 MG/ML IJ SOLN
INTRAMUSCULAR | Status: DC | PRN
  Administered 2016-07-11: 30 mg via INTRAVENOUS

## 2016-07-11 MED ORDER — OXYCODONE HCL 5 MG/5ML PO SOLN
5.0000 mg | Freq: Once | ORAL | Status: AC | PRN
  Filled 2016-07-11: qty 5

## 2016-07-11 MED ORDER — PROPOFOL 10 MG/ML IV BOLUS
INTRAVENOUS | Status: DC | PRN
  Administered 2016-07-11: 250 mg via INTRAVENOUS
  Administered 2016-07-11: 50 mg via INTRAVENOUS

## 2016-07-11 MED ORDER — KETOROLAC TROMETHAMINE 30 MG/ML IJ SOLN
INTRAMUSCULAR | Status: AC
  Filled 2016-07-11: qty 1

## 2016-07-11 MED ORDER — ONDANSETRON HCL 4 MG/2ML IJ SOLN
INTRAMUSCULAR | Status: DC | PRN
  Administered 2016-07-11: 4 mg via INTRAVENOUS

## 2016-07-11 MED ORDER — SODIUM CHLORIDE 0.9 % IR SOLN
Status: DC | PRN
  Administered 2016-07-11: 3000 mL via INTRAVESICAL
  Administered 2016-07-11: 500 mL
  Administered 2016-07-11: 1000 mL via INTRAVESICAL

## 2016-07-11 MED ORDER — PROMETHAZINE HCL 25 MG/ML IJ SOLN
6.2500 mg | INTRAMUSCULAR | Status: DC | PRN
  Administered 2016-07-11: 6.25 mg via INTRAVENOUS
  Filled 2016-07-11: qty 1

## 2016-07-11 MED ORDER — LACTATED RINGERS IV SOLN
INTRAVENOUS | Status: DC
  Administered 2016-07-11: 10:00:00 via INTRAVENOUS
  Filled 2016-07-11: qty 1000

## 2016-07-11 MED ORDER — FENTANYL CITRATE (PF) 100 MCG/2ML IJ SOLN
INTRAMUSCULAR | Status: AC
  Filled 2016-07-11: qty 2

## 2016-07-11 MED ORDER — EPHEDRINE SULFATE 50 MG/ML IJ SOLN
INTRAMUSCULAR | Status: DC | PRN
  Administered 2016-07-11 (×2): 25 mg via INTRAVENOUS

## 2016-07-11 MED ORDER — EPHEDRINE SULFATE 50 MG/ML IJ SOLN
INTRAMUSCULAR | Status: AC
  Filled 2016-07-11: qty 1

## 2016-07-11 MED ORDER — GENTAMICIN SULFATE 40 MG/ML IJ SOLN
5.0000 mg/kg | INTRAMUSCULAR | Status: AC
  Administered 2016-07-11: 440 mg via INTRAVENOUS
  Filled 2016-07-11: qty 11

## 2016-07-11 MED ORDER — OXYCODONE HCL 5 MG PO TABS
ORAL_TABLET | ORAL | Status: AC
  Filled 2016-07-11: qty 1

## 2016-07-11 MED ORDER — MIDAZOLAM HCL 5 MG/5ML IJ SOLN
INTRAMUSCULAR | Status: DC | PRN
  Administered 2016-07-11: 2 mg via INTRAVENOUS

## 2016-07-11 MED ORDER — FENTANYL CITRATE (PF) 100 MCG/2ML IJ SOLN
25.0000 ug | INTRAMUSCULAR | Status: DC | PRN
  Administered 2016-07-11 (×3): 25 ug via INTRAVENOUS
  Filled 2016-07-11: qty 1

## 2016-07-11 MED ORDER — ONDANSETRON HCL 4 MG/2ML IJ SOLN
INTRAMUSCULAR | Status: AC
  Filled 2016-07-11: qty 2

## 2016-07-11 MED ORDER — LIDOCAINE HCL (CARDIAC) 20 MG/ML IV SOLN
INTRAVENOUS | Status: DC | PRN
  Administered 2016-07-11: 100 mg via INTRAVENOUS

## 2016-07-11 MED ORDER — CEPHALEXIN 500 MG PO CAPS: 500.0000 mg | ORAL_CAPSULE | Freq: Two times a day (BID) | ORAL | Status: DC

## 2016-07-11 MED ORDER — KETOROLAC TROMETHAMINE 30 MG/ML IJ SOLN
30.0000 mg | Freq: Once | INTRAMUSCULAR | Status: DC | PRN
  Filled 2016-07-11: qty 1

## 2016-07-11 MED ORDER — LIDOCAINE HCL (CARDIAC) 20 MG/ML IV SOLN
INTRAVENOUS | Status: AC
  Filled 2016-07-11: qty 5

## 2016-07-11 MED ORDER — MIDAZOLAM HCL 2 MG/2ML IJ SOLN
INTRAMUSCULAR | Status: AC
  Filled 2016-07-11: qty 2

## 2016-07-11 SURGICAL SUPPLY — 25 items
BAG DRAIN URO-CYSTO SKYTR STRL (DRAIN) ×2 IMPLANT
BASKET DAKOTA 1.9FR 11X120 (BASKET) IMPLANT
BASKET LASER NITINOL 1.9FR (BASKET) IMPLANT
BASKET ZERO TIP NITINOL 2.4FR (BASKET) IMPLANT
CATH INTERMIT  6FR 70CM (CATHETERS) ×2 IMPLANT
CLOTH BEACON ORANGE TIMEOUT ST (SAFETY) ×2 IMPLANT
FIBER LASER FLEXIVA 365 (UROLOGICAL SUPPLIES) IMPLANT
FIBER LASER TRAC TIP (UROLOGICAL SUPPLIES) IMPLANT
GLOVE BIO SURGEON STRL SZ7.5 (GLOVE) ×2 IMPLANT
GOWN STRL REUS W/ TWL LRG LVL3 (GOWN DISPOSABLE) ×2 IMPLANT
GOWN STRL REUS W/ TWL XL LVL3 (GOWN DISPOSABLE) ×1 IMPLANT
GOWN STRL REUS W/TWL LRG LVL3 (GOWN DISPOSABLE) ×2
GOWN STRL REUS W/TWL XL LVL3 (GOWN DISPOSABLE) ×1
GUIDEWIRE ANG ZIPWIRE 038X150 (WIRE) ×2 IMPLANT
GUIDEWIRE STR DUAL SENSOR (WIRE) ×2 IMPLANT
IV NS 1000ML (IV SOLUTION) ×1
IV NS 1000ML BAXH (IV SOLUTION) ×1 IMPLANT
IV NS IRRIG 3000ML ARTHROMATIC (IV SOLUTION) ×2 IMPLANT
KIT ROOM TURNOVER WOR (KITS) ×2 IMPLANT
MANIFOLD NEPTUNE II (INSTRUMENTS) ×2 IMPLANT
PACK CYSTO (CUSTOM PROCEDURE TRAY) ×2 IMPLANT
STENT POLARIS 5FRX26 (STENTS) ×2 IMPLANT
SYRINGE 10CC LL (SYRINGE) ×2 IMPLANT
TUBE CONNECTING 12X1/4 (SUCTIONS) ×2 IMPLANT
TUBE FEEDING 8FR 16IN STR KANG (MISCELLANEOUS) ×2 IMPLANT

## 2016-07-11 NOTE — H&P (Signed)
Shane Saunders is an 45 y.o. male.    Chief Complaint: Pre-op RIGHT ureteroscopoic stone manipulation  HPI:   1 - Nephrolithiasis - 3mm Rt distal stone with very mild hydro by ER CT 06/2016 on eval flank pain. Stone is solitary, 3mm, distal 1/4 of ureter, 250 HU, SSD 14cm. No prior colic.   PMH sig for appy, vasectomy, congenital bowel malrotation. His PCP is [name] MD.   Today "Layla" is seen to proceed with RIGHT ureteroscopic stone manipulation for his refracory colic from right ureteral stone. No interval fevers.    Past Medical History  Diagnosis Date  . Hyperlipidemia   . Prediabetes   . Obesity (BMI 30-39.9)   . Hypogonadism male   . Vitamin D deficiency   . 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 Right ureteral stone   . Borderline hypertension   . BPH (benign prostatic hypertrophy)   . Wears glasses   . OSA (obstructive sleep apnea)     pt stopped using cpap June 2017 because he lost wt  . Depression   . Anxiety     Past Surgical History  Procedure Laterality Date  . Abdominal exploration surgery  1998    Celiotomy and Appendectomy /  (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, congenital)  . Artery repair Right 05/31/2014    Procedure: IRRIGATION, EXPLORATION, AND REPAIR OF RIGHT ARM WOUND;  Surgeon: Cherylynn Ridges, MD;  Location: [facility] OR;  Service: General;  Laterality: Right;    Family History  Problem Relation Age of Onset  . Hypertension Mother   . Migraines Mother   . Thyroid disease Mother   . Hypertension Father   . Hyperlipidemia Father   . Diabetes Father   . Cancer Sister     thyroid   Social History:  reports that he quit smoking about 6 years ago. His smoking use included Cigarettes. He has a 5 pack-year smoking history. His smokeless tobacco use includes Snuff and Chew. He reports that he drinks alcohol. He reports that he does not use illicit drugs.  Allergies:  Allergies  Allergen Reactions  . Codeine Other (See  Comments)    "can's sleep"  . Effexor [Venlafaxine] Other (See Comments)    ED  . Prednisone Other (See Comments)    "can't sleep"  . Tramadol Hcl Itching  . Zinc Other (See Comments)    "stomache upset"    No prescriptions prior to admission    No results found for this or any previous visit (from the past 48 hour(s)). No results found.  Review of Systems  Constitutional: Positive for malaise/fatigue. Negative for fever and chills.  HENT: Negative.   Eyes: Negative.   Respiratory: Negative.   Cardiovascular: Negative.   Gastrointestinal: Negative.   Genitourinary: Positive for flank pain.  Musculoskeletal: Negative.   Skin: Negative.   Neurological: Negative.   Endo/Heme/Allergies: Negative.   Psychiatric/Behavioral: Negative.     There were no vitals taken for this visit. Physical Exam  Constitutional: He appears well-developed.  HENT:  Head: Normocephalic.  Eyes: Pupils are equal, round, and reactive to light.  Neck: Normal range of motion.  Cardiovascular: Normal rate.   Respiratory: Effort normal.  GI: Soft.  Genitourinary:  Stable moderate Rt CVAT  Musculoskeletal: Normal range of motion.  Neurological: He is alert.  Skin: Skin is warm.  Psychiatric: He has a normal mood and affect. His behavior is normal. Judgment and thought content normal.  Assessment/Plan  1 - Nephrolithiasis - rediscussed options of medical therapy (at least 80% chance of passage given size / location), SWL (not favored given distal location), and Ureteroscopy (most invasive, but highest chances of immediate treatment) in detail. He opts for ureteroscopy todays as planned as his colic is severe.   Risks, benefits, alternatives, expected per-op coure, possible need for staged procedure (stent only today) and possible need for temporary post-op stent discussed again.   Sebastian AcheMANNY, Glada Wickstrom, MD 07/11/2016, 6:08 AM

## 2016-07-11 NOTE — Anesthesia Procedure Notes (Signed)
Procedure Name: LMA Insertion Date/Time: 07/11/2016 11:58 AM Performed by: Norva PavlovALLAWAY, Lakya Schrupp G Pre-anesthesia Checklist: Patient identified, Emergency Drugs available, Suction available and Patient being monitored Patient Re-evaluated:Patient Re-evaluated prior to inductionOxygen Delivery Method: Circle System Utilized Preoxygenation: Pre-oxygenation with 100% oxygen Intubation Type: IV induction Ventilation: Mask ventilation without difficulty LMA: LMA inserted LMA Size: 5.0 Number of attempts: 1 Airway Equipment and Method: bite block Placement Confirmation: positive ETCO2 Tube secured with: Tape Dental Injury: Teeth and Oropharynx as per pre-operative assessment

## 2016-07-11 NOTE — Anesthesia Preprocedure Evaluation (Addendum)
Anesthesia Evaluation  Patient identified by MRN, date of birth, ID band Patient awake    Reviewed: Allergy & Precautions, NPO status , Patient's Chart, lab work & pertinent test results  Airway Mallampati: III  TM Distance: <3 FB Neck ROM: Full    Dental no notable dental hx.    Pulmonary neg pulmonary ROS, former smoker,    Pulmonary exam normal breath sounds clear to auscultation       Cardiovascular negative cardio ROS Normal cardiovascular exam Rhythm:Regular Rate:Normal     Neuro/Psych negative neurological ROS  negative psych ROS   GI/Hepatic negative GI ROS, Neg liver ROS,   Endo/Other  Morbid obesity  Renal/GU negative Renal ROS  negative genitourinary   Musculoskeletal negative musculoskeletal ROS (+)   Abdominal   Peds negative pediatric ROS (+)  Hematology negative hematology ROS (+)   Anesthesia Other Findings   Reproductive/Obstetrics negative OB ROS                            Anesthesia Physical Anesthesia Plan  ASA: II  Anesthesia Plan: General   Post-op Pain Management:    Induction: Intravenous  Airway Management Planned: LMA  Additional Equipment:   Intra-op Plan:   Post-operative Plan: Extubation in OR  Informed Consent: I have reviewed the patients History and Physical, chart, labs and discussed the procedure including the risks, benefits and alternatives for the proposed anesthesia with the patient or authorized representative who has indicated his/her understanding and acceptance.   Dental advisory given  Plan Discussed with: CRNA and Surgeon  Anesthesia Plan Comments:         Anesthesia Quick Evaluation

## 2016-07-11 NOTE — Discharge Instructions (Signed)
1 - You may have urinary urgency (bladder spasms) and bloody urine on / off with stent in place. This is normal.  2 - Remove tethered stent on Monday morning at home by pulling on string, then blue-white plastic tubing, and discarding. Office is open Monday if any issues arise.   3 - Call MD or go to ER for fever >102, severe pain / nausea / vomiting not relieved by medications, or acute change in medical status  Post Anesthesia Home Care Instructions  Activity: Get plenty of rest for the remainder of the day. A responsible adult should stay with you for 24 hours following the procedure.  For the next 24 hours, DO NOT: -Drive a car -Operate machinery -Drink alcoholic beverages -Take any medication unless instructed by your physician -Make any legal decisions or sign important papers.  Meals: Start with liquid foods such as gelatin or soup. Progress to regular foods as tolerated. Avoid greasy, spicy, heavy foods. If nausea and/or vomiting occur, drink only clear liquids until the nausea and/or vomiting subsides. Call your physician if vomiting continues.  Special Instructions/Symptoms: Your throat may feel dry or sore from the anesthesia or the breathing tube placed in your throat during surgery. If this causes discomfort, gargle with warm salt water. The discomfort should disappear within 24 hours.  If you had a scopolamine patch placed behind your ear for the management of post- operative nausea and/or vomiting:  1. The medication in the patch is effective for 72 hours, after which it should be removed.  Wrap patch in a tissue and discard in the trash. Wash hands thoroughly with soap and water. 2. You may remove the patch earlier than 72 hours if you experience unpleasant side effects which may include dry mouth, dizziness or visual disturbances. 3. Avoid touching the patch. Wash your hands with soap and water after contact with the patch.    

## 2016-07-11 NOTE — Transfer of Care (Signed)
Last Vitals:  Filed Vitals:   07/11/16 0933 07/11/16 1230  BP: 107/72   Pulse: 56   Temp: 36.4 C 36.9 C  Resp: 14     Last Pain:  Filed Vitals:   07/11/16 1234  PainSc: 3       Patients Stated Pain Goal: 5 (07/11/16 0946) Immediate Anesthesia Transfer of Care Note  Patient: Shane Saunders  Procedure(s) Performed: Procedure(s) (LRB): CYSTOSCOPY WITH RETROGRADE PYELOGRAM, URETEROSCOPY, BASKET STONE EXTRACTION  AND STENT PLACEMENT (Right)  Patient Location: PACU  Anesthesia Type: General  Level of Consciousness: awake, alert  and oriented  Airway & Oxygen Therapy: Patient Spontanous Breathing and Patient connected to face mask oxygen  Post-op Assessment: Report given to PACU RN and Post -op Vital signs reviewed and stable  Post vital signs: Reviewed and stable  Complications: No apparent anesthesia complications

## 2016-07-11 NOTE — Anesthesia Postprocedure Evaluation (Signed)
Anesthesia Post Note  Patient: Shane Saunders  Procedure(s) Performed: Procedure(s) (LRB): CYSTOSCOPY WITH RETROGRADE PYELOGRAM, URETEROSCOPY, BASKET STONE EXTRACTION  AND STENT PLACEMENT (Right)  Patient location during evaluation: PACU Anesthesia Type: General Level of consciousness: sedated Pain management: pain level controlled Vital Signs Assessment: post-procedure vital signs reviewed and stable Respiratory status: spontaneous breathing and respiratory function stable Cardiovascular status: stable Anesthetic complications: no    Last Vitals:  Filed Vitals:   07/11/16 1300 07/11/16 1315  BP: 124/84 116/78  Pulse: 79 74  Temp:    Resp: 13 10    Last Pain:  Filed Vitals:   07/11/16 1318  PainSc: 2                  Torre Schaumburg DANIEL

## 2016-07-11 NOTE — Brief Op Note (Signed)
07/11/2016  12:17 PM  PATIENT:  Shane Saunders  45 y.o. male  PRE-OPERATIVE DIAGNOSIS:  RIGHT URETERAL STONE  POST-OPERATIVE DIAGNOSIS:  RIGHT URETERAL STONE  PROCEDURE:  Procedure(s) with comments: CYSTOSCOPY WITH RETROGRADE PYELOGRAM, URETEROSCOPY, BASKET STONE EXTRACTION  AND STENT PLACEMENT (Right) - 45 MINS  C-ARM DIGITAL URETEROSCOPE HOLMIUM LASER  SURGEON:  Surgeon(s) and Role:    * Sebastian Acheheodore Anthea Udovich, MD - Primary  PHYSICIAN ASSISTANT:   ASSISTANTS: none   ANESTHESIA:   general  EBL:     BLOOD ADMINISTERED:none  DRAINS: none   LOCAL MEDICATIONS USED:  NONE  SPECIMEN:  Source of Specimen:  Rt ureteral stone  DISPOSITION OF SPECIMEN:  [facility] for compositional analysis  COUNTS:  YES  TOURNIQUET:  * No tourniquets in log *  DICTATION: .Other Dictation: Dictation Number [id number]  PLAN OF CARE: Discharge to home after PACU  PATIENT DISPOSITION:  PACU - hemodynamically stable.   Delay start of Pharmacological VTE agent (>24hrs) due to surgical blood loss or risk of bleeding: not applicable

## 2016-07-14 ENCOUNTER — Encounter (HOSPITAL_BASED_OUTPATIENT_CLINIC_OR_DEPARTMENT_OTHER): Payer: Self-pay | Admitting: Urology

## 2016-07-14 NOTE — Op Note (Signed)
NAME:  Shane Saunders, Shane Saunders NO.:  [id number]  MEDICAL RECORD NO.:  [id number]  LOCATION:                                 FACILITY:  PHYSICIAN:  Sebastian Ache, MD     DATE OF BIRTH:  1971/06/21  DATE OF PROCEDURE: 07/11/2016                              OPERATIVE REPORT   DIAGNOSIS:  Right ureteral stone with refractory colic.  PROCEDURES: 1. Cystoscopy with right retrograde pyelogram and interpretation. 2. Right ureteroscopy with basketing of stone. 3. Insertion of right ureteral stent, 5 x 26, Polaris with tether.  ESTIMATED BLOOD LOSS:  Nil.  COMPLICATION:  None.  SPECIMENS:  Right distal ureteral stone for compositional analysis.  FINDINGS: 1. Unremarkable urinary bladder. 2. Very mild hydroureteronephrosis down to small filling defect in Shane     distal ureter consistent with known stone. 3. Distal ureteral stone approximately 2 cm above Shane right ureteral     orifice as anticipated, this was amenable to simple basketing. 4. Successful placement of right ureteral stent, proximal in Shane upper     pole and distal in Shane urinary bladder.  INDICATION:  Shane Saunders is a very pleasant 45 year old gentleman who was found on workup of severe colicky flank pain to have Shane right distal ureteral stone by axial imaging.  He underwent initial trial of medical therapy given Shane stone is relatively small size; however, he failed to pass Shane stone and his colic symptoms remained quite severe with refractory pain as well as significant nausea.  Options were discussed for management including continued medical therapy versus shockwave lithotripsy versus ureteroscopy and he adamantly wished to proceed with Shane latter.  Informed consent was obtained and placed in Shane medical record.  PROCEDURE IN DETAIL:  Shane Saunders being [facility], was verified. Procedure being right ureteroscopic stone manipulation was confirmed. Procedure was carried out.  Time-out was  performed.  Intravenous antibiotics were administered.  General LMA anesthesia was introduced. Shane Saunders was placed into a low lithotomy position and sterile field was created by prepping and draping Shane Saunders's penis, perineum, and proximal thighs using iodine x3.  Next, cystourethroscopy was performed using a 23-French rigid cystoscope with offset lens.  Inspection of Shane anterior and posterior urethra was unremarkable.  Inspection of Shane urinary bladder revealed no diverticula, calcifications, papular lesions.  Ureteral orifices appeared singleton bilaterally, but right ureteral orifice was cannulated with a 6-French end-hole catheter and right retrograde pyelogram was obtained.  Right retrograde pyelogram demonstrated a single right ureter with single-system right kidney.  There was a small mobile filling defect in Shane distal most ureter consistent with known stone.  There was very mild right hydroureteronephrosis above this.  A 0.038 zip wire was advanced to Shane level of Shane upper pole, set aside as a safety wire.  An 8-French feeding tube placed in Shane urinary bladder for pressure release.  Next, semi-rigid ureteroscopy was performed to Shane distal right ureter alongside a separate Sensor working wire.  This stone in question was indeed encountered in Shane distal ureter approximately 2 cm above Shane intramural ureter.  This was quite ovoid in appearance and did appear amenable to simple basketing.  As such, it  was grasped on its long axis and Escape-type basket and carefully navigated down in its entirety and set aside for compositional analysis.  Given Shane Saunders's significant colic symptoms, it was felt that interval stenting would be warranted. As such, a new 5 x 26 Polaris-type stent was placed over Shane remaining safety wire using fluoroscopic guidance, proximal deployment in Shane upper pole, distal in Shane urinary bladder.  Tether was left in place, fashioned to Shane dorsum of  Shane penis.  Procedure was then terminated. Shane Saunders tolerated Shane procedure well.  There were no immediate periprocedural complications.  Shane Saunders was taken to Shane postanesthesia care unit in stable condition.    ______________________________ Sebastian Acheheodore Aariya Ferrick, MD   ______________________________ Sebastian Acheheodore Adrianah Prophete, MD    TM/MEDQ  D:  07/11/2016  T:  07/12/2016  Job:  [id number]

## 2016-07-15 ENCOUNTER — Encounter (HOSPITAL_COMMUNITY): Payer: Self-pay | Admitting: Emergency Medicine

## 2016-07-15 ENCOUNTER — Ambulatory Visit (INDEPENDENT_AMBULATORY_CARE_PROVIDER_SITE_OTHER): Payer: 59 | Admitting: Internal Medicine

## 2016-07-15 ENCOUNTER — Emergency Department (HOSPITAL_COMMUNITY): Payer: 59

## 2016-07-15 ENCOUNTER — Emergency Department (HOSPITAL_COMMUNITY)
Admission: EM | Admit: 2016-07-15 | Discharge: 2016-07-15 | Disposition: A | Discharge status: Home / Self Care | Payer: 59 | Attending: Emergency Medicine | Admitting: Emergency Medicine

## 2016-07-15 VITALS — BP 138/80 | HR 76 | Temp 98.2°F | Resp 18 | Ht 70.0 in

## 2016-07-15 DIAGNOSIS — E785 Hyperlipidemia, unspecified: Secondary | ICD-10-CM | POA: Diagnosis not present

## 2016-07-15 DIAGNOSIS — F17220 Nicotine dependence, chewing tobacco, uncomplicated: Secondary | ICD-10-CM | POA: Insufficient documentation

## 2016-07-15 DIAGNOSIS — R109 Unspecified abdominal pain: Secondary | ICD-10-CM

## 2016-07-15 DIAGNOSIS — N201 Calculus of ureter: Secondary | ICD-10-CM | POA: Diagnosis not present

## 2016-07-15 DIAGNOSIS — N23 Unspecified renal colic: Secondary | ICD-10-CM

## 2016-07-15 DIAGNOSIS — R1084 Generalized abdominal pain: Secondary | ICD-10-CM | POA: Diagnosis not present

## 2016-07-15 DIAGNOSIS — K5900 Constipation, unspecified: Secondary | ICD-10-CM

## 2016-07-15 DIAGNOSIS — F329 Major depressive disorder, single episode, unspecified: Secondary | ICD-10-CM | POA: Diagnosis not present

## 2016-07-15 DIAGNOSIS — Z79899 Other long term (current) drug therapy: Secondary | ICD-10-CM | POA: Insufficient documentation

## 2016-07-15 LAB — CBC WITH DIFFERENTIAL/PLATELET
Basophils Absolute: 0.1 10*3/uL (ref 0.0–0.1)
Basophils Relative: 1 %
Eosinophils Absolute: 0.4 10*3/uL (ref 0.0–0.7)
Eosinophils Relative: 6 %
HCT: 44.1 % (ref 39.0–52.0)
Hemoglobin: 16.1 g/dL (ref 13.0–17.0)
Lymphocytes Relative: 21 %
Lymphs Abs: 1.6 10*3/uL (ref 0.7–4.0)
MCH: 31.9 pg (ref 26.0–34.0)
MCHC: 36.5 g/dL — ABNORMAL HIGH (ref 30.0–36.0)
MCV: 87.5 fL (ref 78.0–100.0)
Monocytes Absolute: 0.9 10*3/uL (ref 0.1–1.0)
Monocytes Relative: 12 %
Neutro Abs: 4.4 10*3/uL (ref 1.7–7.7)
Neutrophils Relative %: 60 %
Platelets: ADEQUATE 10*3/uL (ref 150–400)
RBC: 5.04 MIL/uL (ref 4.22–5.81)
RDW: 12.6 % (ref 11.5–15.5)
WBC: 7.4 10*3/uL (ref 4.0–10.5)

## 2016-07-15 LAB — URINALYSIS, ROUTINE W REFLEX MICROSCOPIC
Bilirubin Urine: NEGATIVE
Glucose, UA: NEGATIVE mg/dL
Ketones, ur: NEGATIVE mg/dL
Leukocytes, UA: NEGATIVE
Nitrite: NEGATIVE
Protein, ur: 100 mg/dL — AB
Specific Gravity, Urine: 1.026 (ref 1.005–1.030)
pH: 6 (ref 5.0–8.0)

## 2016-07-15 LAB — I-STAT CHEM 8, ED
BUN: 21 mg/dL — ABNORMAL HIGH (ref 6–20)
Calcium, Ion: 1.21 mmol/L (ref 1.13–1.30)
Chloride: 103 mmol/L (ref 101–111)
Creatinine, Ser: 1.4 mg/dL — ABNORMAL HIGH (ref 0.61–1.24)
Glucose, Bld: 110 mg/dL — ABNORMAL HIGH (ref 65–99)
HCT: 45 % (ref 39.0–52.0)
Hemoglobin: 15.3 g/dL (ref 13.0–17.0)
Potassium: 4.4 mmol/L (ref 3.5–5.1)
Sodium: 141 mmol/L (ref 135–145)
TCO2: 29 mmol/L (ref 0–100)

## 2016-07-15 LAB — URINE MICROSCOPIC-ADD ON

## 2016-07-15 MED ORDER — POLYETHYLENE GLYCOL 3350 17 G PO PACK: 17.0000 g | PACK | Freq: Two times a day (BID) | ORAL | Status: DC

## 2016-07-15 MED ORDER — HYDROMORPHONE HCL 1 MG/ML IJ SOLN
1.0000 mg | Freq: Once | INTRAMUSCULAR | Status: AC
  Administered 2016-07-15: 1 mg via INTRAVENOUS
  Filled 2016-07-15: qty 1

## 2016-07-15 MED ORDER — KETOROLAC TROMETHAMINE 30 MG/ML IJ SOLN
30.0000 mg | Freq: Once | INTRAMUSCULAR | Status: AC
  Administered 2016-07-15: 30 mg via INTRAVENOUS
  Filled 2016-07-15: qty 1

## 2016-07-15 MED ORDER — DIAZEPAM 5 MG PO TABS: 5.0000 mg | ORAL_TABLET | Freq: Three times a day (TID) | ORAL | Status: DC | PRN

## 2016-07-15 NOTE — ED Provider Notes (Signed)
CSN: [id number]     Arrival date & time 07/15/16  1415 History   First MD Initiated Contact with Patient 07/15/16 1434     Chief Complaint  Patient presents with  . Flank Pain     Patient is a 45 y.o. male presenting with flank pain. The history is provided by the patient.  Flank Pain This is a recurrent problem. Pertinent negatives include no chest pain, no abdominal pain and no shortness of breath.  Patient with right flank pain. History of ureteral stones and had a stent removed yesterday. Since then and has cramping pain. Some constipation 2. Pain got severe. Seen at primary care doctor and sent to ER due to severe pain. No relief of medicines at home. Has seen Dr. Berneice Heinrich. No fevers or chills. Pain is similar to pain he had with the stone.   Past Medical History  Diagnosis Date  . Hyperlipidemia   . Prediabetes   . Obesity (BMI 30-39.9)   . Hypogonadism male   . Vitamin D deficiency   . 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 Right ureteral stone   . Borderline hypertension   . BPH (benign prostatic hypertrophy)   . Wears glasses   . OSA (obstructive sleep apnea)     pt stopped using cpap June 2017 because he lost wt  . Depression   . Anxiety    Past Surgical History  Procedure Laterality Date  . Abdominal exploration surgery  1998    Celiotomy and Appendectomy /  (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, congenital)  . Artery repair Right 05/31/2014    Procedure: IRRIGATION, EXPLORATION, AND REPAIR OF RIGHT ARM WOUND;  Surgeon: Cherylynn Ridges, MD;  Location: [facility] OR;  Service: General;  Laterality: Right;  . Cystoscopy with retrograde pyelogram, ureteroscopy and stent placement Right 07/11/2016    Procedure: CYSTOSCOPY WITH RETROGRADE PYELOGRAM, URETEROSCOPY, BASKET STONE EXTRACTION  AND STENT PLACEMENT;  Surgeon: Sebastian Ache, MD;  Location: [facility];  Service: Urology;  Laterality: Right;  45 MINS  C-ARM DIGITAL URETEROSCOPE HOLMIUM LASER    Family History  Problem Relation Age of Onset  . Hypertension Mother   . Migraines Mother   . Thyroid disease Mother   . Hypertension Father   . Hyperlipidemia Father   . Diabetes Father   . Cancer Sister     thyroid   Social History  Substance Use Topics  . Smoking status: Former Smoker -- 0.50 packs/day for 10 years    Types: Cigarettes    Quit date: 10/27/2009  . Smokeless tobacco: Current User    Types: Snuff, Chew  . Alcohol Use: Yes     Comment: occasional    Review of Systems  Constitutional: Negative for appetite change.  HENT: Negative for congestion.   Respiratory: Negative for shortness of breath.   Cardiovascular: Negative for chest pain.  Gastrointestinal: Positive for nausea and constipation. Negative for abdominal pain.  Genitourinary: Positive for hematuria and flank pain.  Musculoskeletal: Positive for back pain.  Skin: Negative for wound.  Neurological: Negative for numbness.  Psychiatric/Behavioral: Negative for confusion.      Allergies  Codeine; Effexor; Prednisone; Tramadol hcl; and Zinc  Home Medications   Prior to Admission medications   Medication Sig Start Date End Date Taking? Authorizing Provider  ALPRAZolam Prudy Feeler) 1 MG tablet Take 1 mg by mouth at bedtime as needed for anxiety.   Yes Historical Provider, MD  atorvastatin (  LIPITOR) 80 MG tablet TAKE 1 TABLET EVERY DAY Patient taking differently: Take 40 mgs daily 10/28/15  Yes [name], MD  bismuth subsalicylate (PEPTO BISMOL) 262 MG/15ML suspension Take 30 mLs by mouth every 6 (six) hours as needed for indigestion.   Yes Historical Provider, MD  Cholecalciferol (VITAMIN D3) 2000 units TABS Take 6,000 tablets by mouth daily.    Yes Historical Provider, MD  diphenhydrAMINE (BENADRYL) 25 MG tablet Take 25 mg by mouth every 6 (six) hours as needed for allergies.   Yes Historical Provider, MD  ketorolac (TORADOL) 10 MG tablet Take 10 mg by mouth every 6 (six) hours as needed. for  pain 07/10/16  Yes Historical Provider, MD  oxyCODONE-acetaminophen (PERCOCET/ROXICET) 5-325 MG tablet Take 1-2 tablets by mouth every 6 (six) hours as needed for moderate pain.  07/10/16  Yes Historical Provider, MD  sertraline (ZOLOFT) 100 MG tablet Take 1 tablet daily Patient taking differently: Take 100 mg by mouth every morning.  02/04/16  Yes [name], MD  tamsulosin (FLOMAX) 0.4 MG CAPS capsule Take 1 capsule at Bedtime for Prostate Patient taking differently: Take 0.4 mg by mouth daily after breakfast.  02/04/16 08/03/16 Yes [name], MD  diazepam (VALIUM) 5 MG tablet Take 1 tablet (5 mg total) by mouth every 8 (eight) hours as needed for muscle spasms. 07/15/16   Benjiman Core, MD  HYDROcodone-acetaminophen (NORCO/VICODIN) 5-325 MG tablet Take 1 tablet by mouth 2 (two) times daily as needed for severe pain. Patient not taking: Reported on 07/15/2016 07/08/16   Tomasita Crumble, MD  polyethylene glycol (MIRALAX / GLYCOLAX) packet Take 17 g by mouth 2 (two) times daily. 07/15/16   Benjiman Core, MD   BP 117/60 mmHg  Pulse 52  Temp(Src) 98.3 F (36.8 C) (Oral)  Resp 16  Ht 5\' 10"  (1.778 m)  Wt 245 lb (111.131 kg)  BMI 35.15 kg/m2  SpO2 92% Physical Exam  Constitutional: He appears well-developed.  Patient standing appears uncomfortable.  HENT:  Head: Atraumatic.  Eyes: EOM are normal.  Neck: Neck supple.  Cardiovascular: Normal rate.   Pulmonary/Chest: Effort normal.  Abdominal: There is tenderness.  No distention.  Genitourinary:  CVA tenderness on right.  Neurological: He is alert.  Skin: Skin is warm.    ED Course  Procedures (including critical care time) Labs Review Labs Reviewed  CBC WITH DIFFERENTIAL/PLATELET - Abnormal; Notable for the following:    MCHC 36.5 (*)    All other components within normal limits  URINALYSIS, ROUTINE W REFLEX MICROSCOPIC (NOT AT [facility]) - Abnormal; Notable for the following:    Color, Urine AMBER (*)    APPearance CLOUDY (*)     Hgb urine dipstick LARGE (*)    Protein, ur 100 (*)    All other components within normal limits  URINE MICROSCOPIC-ADD ON - Abnormal; Notable for the following:    Squamous Epithelial / LPF 0-5 (*)    Bacteria, UA FEW (*)    All other components within normal limits  I-STAT CHEM 8, ED - Abnormal; Notable for the following:    BUN 21 (*)    Creatinine, Ser 1.40 (*)    Glucose, Bld 110 (*)    All other components within normal limits    Imaging Review US Renal  07/15/2016  CLINICAL DATA:  Right flank pain.  Recent stent removal. EXAM: RENAL / URINARY TRACT ULTRASOUND COMPLETE COMPARISON:  07/08/2016 FINDINGS: Right Kidney: Length: 11.2 cm. Echogenicity within normal limits. No mass or hydronephrosis visualized. Left  Kidney: Length: 11.7 cm. Echogenicity within normal limits. No mass or hydronephrosis visualized. Bladder: Appears normal for degree of bladder distention. IMPRESSION: 1. No sonographic abnormality of the kidneys urinary bladder is currently identified. The 3 mm distal ureteral calculus shown on the CT scan of 02/09/2016 is not well seen, although given its previous position, it might be difficult to visualize sonographically. Accordingly this stone may have passed or may still be in the distal ureter. Electronically Signed   By: Gaylyn RongWalter  Liebkemann M.D.   On: 07/15/2016 17:21   I have personally reviewed and evaluated these images and lab results as part of my medical decision-making.   EKG Interpretation None      MDM   Final diagnoses:  Ureteral colic    Patient with pain after having stent removed yesterday. Heals better after treatment. Discussed with Dr. Berneice HeinrichManny. Ultrasound done and did not show hydronephrosis. Will discharge home follow-up as needed. Will give muscle relaxer and miralax    Benjiman CoreNathan Fredrica Capano, MD 07/15/16 [phone]

## 2016-07-15 NOTE — ED Notes (Addendum)
Patient presents for right flank pain, mild dysuria. Removed stent yesterday. Sent by PCP for possible ureteral rupture.

## 2016-07-15 NOTE — Discharge Instructions (Signed)
Renal Colic  Renal colic is pain that is caused by passing a kidney stone. The pain can be sharp and severe. It may be felt in the back, abdomen, side (flank), or groin. It can cause nausea. Renal colic can come and go.  HOME CARE INSTRUCTIONS  Watch your condition for any changes. The following actions may help to lessen any discomfort that you are feeling:  · Take medicines only as directed by your health care provider.  · Ask your health care provider if it is okay to take over-the-counter pain medicine.  · Drink enough fluid to keep your urine clear or pale yellow. Drink 6-8 glasses of water each day.  · Limit the amount of salt that you eat to less than 2 grams per day.  · Reduce the amount of protein in your diet. Eat less meat, fish, nuts, and dairy.  · Avoid foods such as spinach, rhubarb, nuts, or bran. These may make kidney stones more likely to form.  SEEK MEDICAL CARE IF:  · You have a fever or chills.  · Your urine smells bad or looks cloudy.  · You have pain or burning when you pass urine.  SEEK IMMEDIATE MEDICAL CARE IF:  · Your flank pain or groin pain suddenly worsens.  · You become confused or disoriented or you lose consciousness.     This information is not intended to replace advice given to you by your health care provider. Make sure you discuss any questions you have with your health care provider.     Document Released: 09/24/2005 Document Revised: 01/05/2015 Document Reviewed: 10/25/2014  Elsevier Interactive Patient Education ©2016 [employer]

## 2016-07-15 NOTE — Progress Notes (Signed)
Subjective:    Patient ID: Shane Saunders, male    DOB: 1971/08/31, 45 y.o.   MRN: [id number]  HPI  Patient presents to the office for evaluation of right flank pain.  Patient was seen on 07/08/16 in the ER and diagnoses with a refractory ureteral colic.  He was seen by urology and had a cystoscopy with retrograde pyelogram and basketing of the stone with the insertion of a ureteral stent.  He removed the stent yesterday.  He developed severe pain in the last hour to two hours.  He reports that he did take oxycodone and did have some relief yesterday after taking the stent out.  He reports that he has not taken anything today.  He reports that he spoke with his urologist and they told him everything is normal.  He has not taken any pain medication today.  He reports that he was having some sharp pains in his chest.  He feels like it is a squeezing pain.  He is severely constipated.  He reports that his last bowel movement was today.  He is taking miralax, fiber, and an enema at home without relief.  He is passing small amounts of flatus but not much.      Review of Systems  Constitutional: Negative for fever, chills and fatigue.  Respiratory: Negative for chest tightness and shortness of breath.   Cardiovascular: Positive for chest pain. Negative for palpitations.  Gastrointestinal: Positive for abdominal pain and constipation. Negative for nausea, vomiting, diarrhea, blood in stool, abdominal distention and anal bleeding.  Genitourinary: Positive for hematuria, flank pain and testicular pain. Negative for dysuria and difficulty urinating.       Objective:   Physical Exam  Constitutional: He is oriented to person, place, and time. He appears well-developed and well-nourished. No distress.  HENT:  Head: Normocephalic.  Mouth/Throat: Oropharynx is clear and moist. No oropharyngeal exudate.  Eyes: Conjunctivae are normal. No scleral icterus.  Neck: Normal range of motion. Neck supple. No JVD  present. No thyromegaly present.  Cardiovascular: Normal rate, regular rhythm, normal heart sounds and intact distal pulses.  Exam reveals no gallop and no friction rub.   No murmur heard. Pulmonary/Chest: Effort normal and breath sounds normal.  Abdominal: Soft. Normal appearance and bowel sounds are normal. He exhibits no distension and no mass. There is generalized tenderness. There is CVA tenderness. There is no rigidity, no rebound, no guarding, no tenderness at McBurney's point and negative Murphy's sign.  Musculoskeletal: Normal range of motion.  Lymphadenopathy:    He has no cervical adenopathy.  Neurological: He is alert and oriented to person, place, and time. Coordination normal.  Skin: Skin is warm and dry. He is not diaphoretic.  Psychiatric: He has a normal mood and affect. His behavior is normal. Judgment and thought content normal.  Nursing note and vitals reviewed.   Filed Vitals:   07/15/16 1337  BP: 138/80  Pulse: 76  Temp: 98.2 F (36.8 C)  Resp: 18          Assessment & Plan:  Patient reports to the office after recent cystoscopy, basket retrieval of 3mm kidney stone and stent placement on 07/11/16.  He has severe right flank pain and also generalized abdominal tenderness to palpation.  Given recent procedure ddx includes ureteral spasm, ureteral perforation, bowel obstruction.  Likely needs IV pain medication as he was writhing in discomfort secondary to pain.  Will also need at least KUB vs. CT abdomen and pelvis.  If  appropriate contact urology.  Procedure was performed by Dr. Berneice HeinrichManny.  1. Right flank pain   2. Generalized abdominal pain   3. Constipation, unspecified constipation type

## 2016-08-01 ENCOUNTER — Other Ambulatory Visit: Payer: Self-pay | Admitting: Internal Medicine

## 2016-08-01 DIAGNOSIS — F411 Generalized anxiety disorder: Secondary | ICD-10-CM

## 2016-08-02 ENCOUNTER — Other Ambulatory Visit: Payer: Self-pay | Admitting: Internal Medicine

## 2016-08-12 ENCOUNTER — Ambulatory Visit (INDEPENDENT_AMBULATORY_CARE_PROVIDER_SITE_OTHER): Payer: 59 | Admitting: Internal Medicine

## 2016-08-12 ENCOUNTER — Encounter: Payer: Self-pay | Admitting: Internal Medicine

## 2016-08-12 ENCOUNTER — Other Ambulatory Visit: Payer: Self-pay | Admitting: Internal Medicine

## 2016-08-12 VITALS — BP 118/80 | HR 81 | Temp 97.9°F | Resp 14 | Ht 70.0 in | Wt 252.4 lb

## 2016-08-12 DIAGNOSIS — G4733 Obstructive sleep apnea (adult) (pediatric): Secondary | ICD-10-CM

## 2016-08-12 DIAGNOSIS — E559 Vitamin D deficiency, unspecified: Secondary | ICD-10-CM | POA: Diagnosis not present

## 2016-08-12 DIAGNOSIS — I10 Essential (primary) hypertension: Secondary | ICD-10-CM | POA: Diagnosis not present

## 2016-08-12 DIAGNOSIS — Z9989 Dependence on other enabling machines and devices: Secondary | ICD-10-CM

## 2016-08-12 DIAGNOSIS — Z79899 Other long term (current) drug therapy: Secondary | ICD-10-CM

## 2016-08-12 DIAGNOSIS — E785 Hyperlipidemia, unspecified: Secondary | ICD-10-CM

## 2016-08-12 DIAGNOSIS — E291 Testicular hypofunction: Secondary | ICD-10-CM

## 2016-08-12 DIAGNOSIS — R7303 Prediabetes: Secondary | ICD-10-CM

## 2016-08-12 LAB — BASIC METABOLIC PANEL WITH GFR
BUN: 14 mg/dL (ref 7–25)
CO2: 25 mmol/L (ref 20–31)
Calcium: 9.4 mg/dL (ref 8.6–10.3)
Chloride: 104 mmol/L (ref 98–110)
Creat: 1.31 mg/dL (ref 0.60–1.35)
GFR, Est African American: 75 mL/min (ref >=60)
GFR, Est Non African American: 65 mL/min (ref >=60)
Glucose, Bld: 170 mg/dL — ABNORMAL HIGH (ref 65–99)
Potassium: 3.8 mmol/L (ref 3.5–5.3)
Sodium: 139 mmol/L (ref 135–146)

## 2016-08-12 LAB — LIPID PANEL
Cholesterol: 171 mg/dL (ref 125–200)
HDL: 35 mg/dL — ABNORMAL LOW (ref >=40)
LDL Cholesterol: 98 mg/dL (ref <130)
Total CHOL/HDL Ratio: 4.9 Ratio (ref <=5.0)
Triglycerides: 192 mg/dL — ABNORMAL HIGH (ref <150)
VLDL: 38 mg/dL — ABNORMAL HIGH (ref <30)

## 2016-08-12 LAB — HEPATIC FUNCTION PANEL
ALT: 26 U/L (ref 9–46)
AST: 19 U/L (ref 10–40)
Albumin: 4.6 g/dL (ref 3.6–5.1)
Alkaline Phosphatase: 72 U/L (ref 40–115)
Bilirubin, Direct: 0.1 mg/dL (ref <=0.2)
Indirect Bilirubin: 0.7 mg/dL (ref 0.2–1.2)
Total Bilirubin: 0.8 mg/dL (ref 0.2–1.2)
Total Protein: 6.6 g/dL (ref 6.1–8.1)

## 2016-08-12 LAB — MAGNESIUM: Magnesium: 2.1 mg/dL (ref 1.5–2.5)

## 2016-08-12 LAB — TSH: TSH: 1.45 mIU/L (ref 0.40–4.50)

## 2016-08-12 NOTE — Progress Notes (Signed)
[facility]                       Shane Saunders, M.D.        Dyanne CarrelAmanda R. Steffanie Dunnollier, P.A.-C       Terri Piedraourtney Forcucci, P.A.-C   [facility]                [address]                [place], [place] [id number] Telephone [phone]) [phone] Telefax [phone] ______________________________________________________________________     This very nice 45 y.o. MWM presents for 6 month follow up with Hypertension, Hyperlipidemia, Pre-Diabetes and Vitamin D Deficiency.      Patient is treated for HTN circa 2010 & BP has been controlled at home. Today's BP is 118/80. Patient has had no complaints of any cardiac type chest pain, palpitations, dyspnea/orthopnea/PND, dizziness, claudication, or dependent edema.     Hyperlipidemia is controlled with diet & meds. Patient denies myalgias or other med SE's. Last Lipids were at goal: Lab Results  Component Value Date   CHOL 114 (L) 05/06/2016   HDL 34 (L) 05/06/2016   LDLCALC 54 05/06/2016   TRIG 129 05/06/2016   CHOLHDL 3.4 05/06/2016      Also, the patient has history of Morbid Obesity  (BMI 36+) and consequently is expectantly screened for PreDiabetes and has had no symptoms of reactive hypoglycemia, diabetic polys, paresthesias or visual blurring.  Last A1c was at goal: Lab Results  Component Value Date   HGBA1C 5.4 05/06/2016      Further, the patient also has history of Vitamin D Deficiency of "33" in 2008 and supplements vitamin D sporadically. Last vitamin D was still very low:  Lab Results  Component Value Date   VD25OH 32 02/04/2016   Current Outpatient Prescriptions on File Prior to Visit  Medication Sig  . ALPRAZolam (XANAX) 1 MG tablet TAKE 1/2 TO 1 TABLET BY MOUTH 2 TO 3 TIMES A DAY AS NEEDED FOR ANXIETY  . atorvastatin (LIPITOR) 80 MG tablet TAKE 1 TABLET EVERY DAY (Patient taking differently: Take 40 mgs daily)  . Cholecalciferol (VITAMIN D3) 2000 units TABS Take 6,000  tablets by mouth daily.   . diphenhydrAMINE (BENADRYL) 25 MG tablet Take 25 mg by mouth every 6 (six) hours as needed for allergies.  Marland Kitchen. ketorolac (TORADOL) 10 MG tablet Take 10 mg by mouth every 6 (six) hours as needed. for pain  . polyethylene glycol (MIRALAX / GLYCOLAX) packet Take 17 g by mouth 2 (two) times daily.  . sertraline (ZOLOFT) 100 MG tablet TAKE 1 TABLET BY MOUTH EVERY DAY   No current facility-administered medications on file prior to visit.    Allergies  Allergen Reactions  . Codeine Other (See Comments)    "can's sleep"  . Effexor [Venlafaxine] Other (See Comments)    ED  . Prednisone Other (See Comments)    "can't sleep"  . Tramadol Hcl Itching  . Zinc Other (See Comments)    "stomache upset"   PMHx:   Past Medical History:  Diagnosis Date  . Anxiety   . Borderline hypertension   . BPH (benign prostatic hypertrophy)   . Depression   . Hyperlipidemia   . Hypogonadism male   . 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 Obesity (BMI 30-39.9)   . OSA (obstructive sleep apnea)    pt stopped  using cpap June 2017 because he lost wt  . Prediabetes   . Right ureteral stone   . Vitamin D deficiency   . Wears glasses    Immunization History  Administered Date(s) Administered  . Influenza-Unspecified 10/20/2014  . PPD Test 01/16/2015  . Pneumococcal-Unspecified 10/27/2006  . Tdap 08/05/2013, 05/31/2014   Past Surgical History:  Procedure Laterality Date  . 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 ARTERY REPAIR Right 05/31/2014   Procedure: IRRIGATION, EXPLORATION, AND REPAIR OF RIGHT ARM WOUND;  Surgeon: Cherylynn RidgesJames O Wyatt, MD;  Location: [facility];  Service: General;  Laterality: Right;  . CYSTOSCOPY WITH RETROGRADE PYELOGRAM, URETEROSCOPY AND STENT PLACEMENT Right 07/11/2016   Procedure: CYSTOSCOPY WITH RETROGRADE PYELOGRAM, URETEROSCOPY, BASKET STONE EXTRACTION  AND STENT  PLACEMENT;  Surgeon: Sebastian Acheheodore Manny, MD;  [facility];  Service: Urology;  Laterality: Right;  45 MINS  C-ARM DIGITAL URETEROSCOPE HOLMIUM LASER   FHx:    Reviewed / unchanged  SHx:    Reviewed / unchanged  Systems Review:  Constitutional: Denies fever, chills, wt changes, headaches, insomnia, fatigue, night sweats, change in appetite. Eyes: Denies redness, blurred vision, diplopia, discharge, itchy, watery eyes.  ENT: Denies discharge, congestion, post nasal drip, epistaxis, sore throat, earache, hearing loss, dental pain, tinnitus, vertigo, sinus pain, snoring.  CV: Denies chest pain, palpitations, irregular heartbeat, syncope, dyspnea, diaphoresis, orthopnea, PND, claudication or edema. Respiratory: denies cough, dyspnea, DOE, pleurisy, hoarseness, laryngitis, wheezing.  Gastrointestinal: Denies dysphagia, odynophagia, heartburn, reflux, water brash, abdominal pain or cramps, nausea, vomiting, bloating, diarrhea, constipation, hematemesis, melena, hematochezia  or hemorrhoids. Does report recent issues with rectal spasms.  Genitourinary: Denies dysuria, frequency, urgency, nocturia, hesitancy, discharge, hematuria or flank pain. Musculoskeletal: Denies arthralgias, myalgias, stiffness, jt. swelling, pain, limping or strain/sprain.  Skin: Denies pruritus, rash, hives, warts, acne, eczema or change in skin lesion(s). Neuro: No weakness, tremor, incoordination, spasms, paresthesia or pain. Psychiatric: Denies confusion, memory loss or sensory loss. Endo: Denies change in weight, skin or hair change.  Heme/Lymph: No excessive bleeding, bruising or enlarged lymph nodes.  Physical Exam  BP 118/80   Pulse 81   Temp 97.9 F (36.6 C)   Resp 14   Ht 5\' 10"  (1.778 m)   Wt 252 lb 6.4 oz (114.5 kg)   SpO2 98%   BMI 36.22 kg/m   Appears overnourished and in no distress.  Eyes: PERRLA, EOMs, conjunctiva no swelling or erythema. Sinuses: No frontal/maxillary  tenderness ENT/Mouth: EAC's clear, TM's nl w/o erythema, bulging. Nares clear w/o erythema, swelling, exudates. Oropharynx clear without erythema or exudates. Oral hygiene is good. Tongue normal, non obstructing. Hearing intact.  Neck: Supple. Thyroid nl. Car 2+/2+ without bruits, nodes or JVD. Chest: Respirations nl with BS clear & equal w/o rales, rhonchi, wheezing or stridor.  Cor: Heart sounds normal w/ regular rate and rhythm without sig. murmurs, gallops, clicks, or rubs. Peripheral pulses normal and equal  without edema.  Abdomen: Soft, rotund & bowel sounds normal. Non-tender w/o guarding, rebound, hernias, masses, or organomegaly.  Lymphatics: Unremarkable.  Musculoskeletal: Full ROM all peripheral extremities, joint stability, 5/5 strength, and normal gait.  Skin: Warm, dry without exposed rashes, lesions or ecchymosis apparent.  Neuro: Cranial nerves intact, reflexes equal bilaterally. Sensory-motor testing grossly intact. Tendon reflexes grossly intact.  Pysch: Alert & oriented x 3.  Insight and judgement nl & appropriate. No ideations.  Assessment and Plan:  1. Essential hypertension  -  Continue medication, monitor blood pressure at home. Continue DASH diet. Reminder to go to the ER if any CP, SOB, nausea, dizziness, severe HA, changes vision/speech, left arm numbness and tingling and jaw pain. - TSH  2. Hyperlipidemia  - Continue diet/meds, exercise,& lifestyle modifications. Continue monitor periodic cholesterol/liver & renal functions  - Lipid panel - TSH  3. Prediabetes  - Continue diet, exercise, lifestyle modifications. Monitor appropriate labs. - Hemoglobin A1c - Insulin, random  4. Vitamin D deficiency  - Continue supplementation. - VITAMIN D 25 Hydroxy   5. Testosterone deficiency   6. OSA on CPAP   7. Medication management  - CBC with Differential/Platelet - BASIC METABOLIC PANEL WITH GFR - Hepatic function panel - Magnesium   Recommended  regular exercise, BP monitoring, weight control, and discussed med and SE's. Recommended labs to assess and monitor clinical status. Further disposition pending results of labs. Over 30 minutes of exam, counseling, chart review was performed

## 2016-08-12 NOTE — Patient Instructions (Signed)

## 2016-08-13 ENCOUNTER — Telehealth: Payer: Self-pay

## 2016-08-13 LAB — CBC WITH DIFFERENTIAL/PLATELET
Basophils Absolute: 63 cells/uL (ref 0–200)
Basophils Relative: 1 %
Eosinophils Absolute: 252 cells/uL (ref 15–500)
Eosinophils Relative: 4 %
HCT: 44.4 % (ref 38.5–50.0)
Hemoglobin: 15 g/dL (ref 13.2–17.1)
Lymphocytes Relative: 24 %
Lymphs Abs: 1512 cells/uL (ref 850–3900)
MCH: 31.1 pg (ref 27.0–33.0)
MCHC: 33.8 g/dL (ref 32.0–36.0)
MCV: 91.9 fL (ref 80.0–100.0)
MPV: 10.3 fL (ref 7.5–12.5)
Monocytes Absolute: 378 cells/uL (ref 200–950)
Monocytes Relative: 6 %
Neutro Abs: 4095 cells/uL (ref 1500–7800)
Neutrophils Relative %: 65 %
RBC: 4.83 MIL/uL (ref 4.20–5.80)
RDW: 13.7 % (ref 11.0–15.0)
WBC: 6.3 10*3/uL (ref 3.8–10.8)

## 2016-08-13 LAB — HEMOGLOBIN A1C
Hgb A1c MFr Bld: 5.2 % (ref <5.7)
Mean Plasma Glucose: 103 mg/dL

## 2016-08-13 LAB — VITAMIN D 25 HYDROXY (VIT D DEFICIENCY, FRACTURES): Vit D, 25-Hydroxy: 47 ng/mL (ref 30–100)

## 2016-08-13 NOTE — Telephone Encounter (Signed)
XANAX WAS CALLED INTO [facility].

## 2016-08-14 LAB — INSULIN, RANDOM: Insulin: 390.2 u[IU]/mL — ABNORMAL HIGH (ref 2.0–19.6)

## 2016-08-27 ENCOUNTER — Encounter: Payer: Self-pay | Admitting: Internal Medicine

## 2016-08-27 ENCOUNTER — Ambulatory Visit (INDEPENDENT_AMBULATORY_CARE_PROVIDER_SITE_OTHER): Payer: 59 | Admitting: Internal Medicine

## 2016-08-27 VITALS — BP 122/80 | HR 72 | Temp 97.4°F | Resp 16 | Ht 70.0 in | Wt 252.0 lb

## 2016-08-27 DIAGNOSIS — K594 Anal spasm: Secondary | ICD-10-CM | POA: Diagnosis not present

## 2016-08-27 MED ORDER — DILTIAZEM HCL 60 MG PO TABS: 60.0000 mg | ORAL_TABLET | Freq: Three times a day (TID) | ORAL | 5 refills | Status: DC

## 2016-08-27 NOTE — Patient Instructions (Signed)
Proctalgia Fugax Proctalgia fugax is a condition that involves very short episodes of intense pain in the rectum. The rectum is the last part of the large intestine. The pain can last from seconds to minutes. Episodes often occur during the night and awaken the person from sleep. This condition is not a sign of cancer, but your health care provider may want to rule out a number of other conditions. CAUSES The cause of this condition is not known. One possible cause may be spasm of the pelvic muscles or of the lowest part of the large intestine. SYMPTOMS The only symptom of this condition is rectal pain.  The pain may be intense or severe.  The pain may last for only a few seconds or it may last up to 30 minutes.  The pain may occur at night and wake you up from sleep. DIAGNOSIS This condition may be diagnosed by ruling out other problems that could cause the pain. Diagnosis may include:  Medical history and physical exam.  Various tests, such as:  Anoscopy. In this test, a lighted scope is put into the rectum to look for abnormalities.  Barium enema. In this test, X-rays are taken after a white chalky substance called barium is put into the colon. The barium makes it easier to see problems because it shows up well on the X-rays.  Blood tests to rule out infections or other problems. TREATMENT There is no specific treatment to cure this condition. Treatment options may include:  Medicines.  Warm baths.  Relaxation techniques.  Gentle massage of the painful area.  Biofeedback. HOME CARE INSTRUCTIONS   Follow instructions from your health care provider about diet.  Follow instructions from your health care provider about rest and physical activity.  Try warm baths, massaging the area, or progressive relaxation techniques as told by your health care provider.  Keep all follow-up visits as told by your health care provider. This is important.

## 2016-08-27 NOTE — Progress Notes (Signed)
  Subjective:    Patient ID: Shane Saunders, male    DOB: 10/09/1971, 45 y.o.   MRN: [id number]  HPI This nice 45 yo MWM presents with c/o rectal spasms occasionally awakening him from sleep and improved with hot tub soaks or taking his Xanax. Describes as an ache or cramping his rectal area. Denies constipation, difficulty defecating or noted blood or mucus in BM's.  Medication Sig  . ALPRAZolam 1 MG tablet TAKE 1/2 TO 1 TABLET BY MOUTH 2 TO 3 TIMES A DAY AS NEEDED FOR ANXIETY  . atorvastatin80 MG tablet TAKE 1 TABLET EVERY DAY (Patient taking differently: Take 40 mgs daily)  . VITAMIN D 2000 units TABS Take 6,000 tablets by mouth daily.   . diphenhydrAMINE  25 MG tablet Take 25 mg by mouth every 6 (six) hours as needed for allergies.  Marland Kitchen. sertraline  100 MG tablet TAKE 1 TABLET BY MOUTH EVERY DAY  . ketorolac / TORADOL) 10 MG tab Take 10 mg by mouth every 6 (six) hours as needed. for pain  . MIRALAX Take 17 g by mouth 2 (two) times daily.   Allergies  Allergen Reactions  . Codeine Other (See Comments)    "can's sleep"  . Effexor [Venlafaxine] Other (See Comments)    ED  . Prednisone Other (See Comments)    "can't sleep"  . Tramadol Hcl Itching  . Zinc Other (See Comments)    "stomache upset"   Past Medical History:  Diagnosis Date  . Anxiety   . Borderline hypertension   . BPH (benign prostatic hypertrophy)   . Depression   . Hyperlipidemia   . Hypogonadism male   . 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 Obesity (BMI 30-39.9)   . OSA (obstructive sleep apnea)    pt stopped using cpap June 2017 because he lost wt  . Prediabetes   . Right ureteral stone   . Vitamin D deficiency   . Wears glasses    Review of Systems  10 point systems review negative except as above.    Objective:   Physical Exam  BP 122/80   Pulse 72   Temp 97.4 F (36.3 C)   Resp 16   Ht 5\' 10"  (1.778 m)   Wt 252 lb (114.3 kg)   BMI 36.16 kg/m   HEENT - Eac's patent.  TM's Nl. EOM's full. PERRLA. NasoOroPharynx clear. Neck - supple. Nl Thyroid. Carotids 2+ & No bruits, nodes, JVD Chest - Clear equal BS w/o Rales, rhonchi, wheezes. Cor - Nl HS. RRR w/o sig MGR. PP 1(+). No edema. Abd - No palpable organomegaly, masses or tenderness. BS nl. GU - DRE - WNL . Neg hemoccult.  MS- FROM w/o deformities. Muscle power, tone and bulk Nl. Gait Nl. Neuro - No obvious Cr N abnormalities. Sensory, motor and Cerebellar functions appear Nl w/o focal abnormalities.    Assessment & Plan:   1. Proctalgia fugax  - Patient prefers to try pills 1st  before ointments  - diltiazem (CARDIZEM) 60 MG tablet; Take 1 tablet (60 mg total) by mouth 3 (three) times daily.  Dispense: 90 tablet; Refill: 5  - advised to call in 5-7 days to consider trial with NTG ung Or GI consult for anoscopy as indicated.

## 2016-09-10 ENCOUNTER — Other Ambulatory Visit: Payer: Self-pay | Admitting: Internal Medicine

## 2016-09-11 ENCOUNTER — Encounter: Payer: Self-pay | Admitting: Gastroenterology

## 2016-09-11 ENCOUNTER — Ambulatory Visit (INDEPENDENT_AMBULATORY_CARE_PROVIDER_SITE_OTHER): Payer: 59 | Admitting: Gastroenterology

## 2016-09-11 VITALS — BP 110/70 | HR 88 | Ht 68.0 in | Wt 251.0 lb

## 2016-09-11 DIAGNOSIS — K6289 Other specified diseases of anus and rectum: Secondary | ICD-10-CM | POA: Diagnosis not present

## 2016-09-11 MED ORDER — NA SULFATE-K SULFATE-MG SULF 17.5-3.13-1.6 GM/177ML PO SOLN: 1.0000 | Freq: Once | ORAL | 0 refills | Status: AC

## 2016-09-11 NOTE — Patient Instructions (Signed)

## 2016-09-11 NOTE — Progress Notes (Addendum)
09/11/2016 Shane Saunders [id number] 12/13/1971   HISTORY OF PRESENT ILLNESS:  This is a 45 year old male who is new to our practice and was referred here by his PCP, Dr. Melford Aase, for complaints of rectal pain. He tells me that this has been present for about the past 2 months and has been worsening. It is constant discomfort. He tells me that it feels like a constant spasm or knot in his rectum, says "it feels like someone has their fist in there". His PCP placed him on diltiazem 60 mg 3 times daily for rectal spasm, however, patient did not have any relief from this. He has not tried anything topically. He tells me that the pain is not in his anus; it feels higher up in his rectum. He does not describe any pain with bowel movements, but is uncomfortable to sit, etc. He is moving his bowels regularly without any issues. He says that he recently had a kidney stone for which he is taking some pain medicine and had one episode of constipation from that; he tried to use an enema unsuccessfully. The pain had started before all this, however.  CT scan abdomen and pelvis without contrast, which was a renal stone study showed congenital malrotation of the bowel and moderate stool throughout the colon but no other bowel issues. He denies any abdominal pain, rectal pain, fevers, chills, change in bowel habits, or any other associated issues. CBC, BMP, hepatic function panel, TSH were all unremarkable couple of weeks ago.   Past Medical History:  Diagnosis Date  . Anxiety   . Borderline hypertension   . BPH (benign prostatic hypertrophy)   . Depression   . Hyperlipidemia   . Hypogonadism male   . 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 Obesity (BMI 30-39.9)   . OSA (obstructive sleep apnea)    pt stopped using cpap June 2017 because he lost wt  . Prediabetes   . Right ureteral stone   . Vitamin D deficiency   . Wears glasses    Past Surgical History:  Procedure  Laterality Date  . 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 ARTERY REPAIR Right 05/31/2014   Procedure: IRRIGATION, EXPLORATION, AND REPAIR OF RIGHT ARM WOUND;  Surgeon: Gwenyth Ober, MD;  Location: [facility];  Service: General;  Laterality: Right;  . CYSTOSCOPY WITH RETROGRADE PYELOGRAM, URETEROSCOPY AND STENT PLACEMENT Right 07/11/2016   Procedure: CYSTOSCOPY WITH RETROGRADE PYELOGRAM, URETEROSCOPY, BASKET STONE EXTRACTION  AND STENT PLACEMENT;  Surgeon: Alexis Frock, MD;  Location: [facility];  Service: Urology;  Laterality: Right;  45 MINS  C-ARM DIGITAL URETEROSCOPE HOLMIUM LASER    reports that he quit smoking about 6 years ago. His smoking use included Cigarettes. He has a 5.00 pack-year smoking history. His smokeless tobacco use includes Snuff. He reports that he drinks alcohol. He reports that he does not use drugs. family history includes Cancer in his sister; Diabetes in his father; Hyperlipidemia in his father; Hypertension in his father and mother; Migraines in his mother; Thyroid disease in his mother. Allergies  Allergen Reactions  . Codeine Other (See Comments)    "can's sleep"  . Effexor [Venlafaxine] Other (See Comments)    ED  . Prednisone Other (See Comments)    "can't sleep"  . Tramadol Hcl Itching  . Zinc Other (See Comments)    "  stomache upset"      Outpatient Encounter Prescriptions as of 09/11/2016  Medication Sig  . ALPRAZolam (XANAX) 1 MG tablet TAKE 1/2 TO 1 TABLET BY MOUTH 2 TO 3 TIMES A DAY AS NEEDED FOR ANXIETY  . atorvastatin (LIPITOR) 80 MG tablet TAKE 1 TABLET BY MOUTH EVERY DAY  . Cholecalciferol (VITAMIN D3) 2000 units TABS Take 6,000 tablets by mouth daily.   Marland Kitchen diltiazem (CARDIZEM) 60 MG tablet Take 1 tablet (60 mg total) by mouth 3 (three) times daily.  . diphenhydrAMINE (BENADRYL) 25 MG tablet Take 25 mg by mouth every 6 (six) hours as needed for allergies.    Marland Kitchen sertraline (ZOLOFT) 100 MG tablet TAKE 1 TABLET BY MOUTH EVERY DAY  . Na Sulfate-K Sulfate-Mg Sulf 17.5-3.13-1.6 GM/180ML SOLN Take 1 kit by mouth once.   No facility-administered encounter medications on file as of 09/11/2016.      REVIEW OF SYSTEMS  : All other systems reviewed and negative except where noted in the History of Present Illness.   PHYSICAL EXAM: BP 110/70 (BP Location: Left Arm, Patient Position: Sitting, Cuff Size: Normal)   Pulse 88   Ht '5\' 8"'$  (1.727 m) Comment: height measured without shoes  Wt 251 lb (113.9 kg)   BMI 38.16 kg/m  General: Well developed white male in no acute distress Head: Normocephalic and atraumatic Eyes:  Sclerae anicteric, conjunctiva pink. Ears: Normal auditory acuity. Lungs: Clear throughout to auscultation Heart: Regular rate and rhythm Abdomen: Soft, non-distended.  BS present.  Non-tender.  Large laparotomy scar noted. Rectal:  No external abnormalities identified.  DRE did not reveal an definite abnormalities.  Patient described pain but not at entrance of anus, pain was more proximal in the rectum. Musculoskeletal: Symmetrical with no gross deformities  Skin: No lesions on visible extremities Extremities: No edema  Neurological: Alert oriented x 4, grossly non-focal Psychological:  Alert and cooperative. Normal mood and affect  ASSESSMENT AND PLAN: -Rectal pain:  Unsure of the cause. The source of his pain seems more in the rectum and the anal area. I did not see any fissure on exam, and exam/history was not necessarily consistent with a fissure.  He was not extremely intolerant of rectal exam. Will schedule for colonoscopy for evaluation. Was going to do flexible sigmoidoscopy instead, but I do not think he'll be tolerant to the enemas and said that he had tried to use an enema home but it did not work adequately. -Congenital malrotation of the bowel  CC:  Unk Pinto, MD

## 2016-09-11 NOTE — Progress Notes (Signed)
Thank you for sending this case to me. I have reviewed the entire note, and the outlined plan seems appropriate.   I personally reviewed the CT urogram images.  There is no oral contrast, of course, but no colon obstruction/dilatation , inflammation or mass seen.  Sounds like proctalgia fugax. Colonoscopy is appropriate to rule out neoplasia or other obstructing cause for symptoms.

## 2016-09-22 ENCOUNTER — Other Ambulatory Visit: Payer: Self-pay | Admitting: Internal Medicine

## 2016-09-23 ENCOUNTER — Other Ambulatory Visit: Payer: Self-pay | Admitting: Internal Medicine

## 2016-09-23 DIAGNOSIS — F411 Generalized anxiety disorder: Secondary | ICD-10-CM

## 2016-09-23 MED ORDER — SERTRALINE HCL 100 MG PO TABS
ORAL_TABLET | ORAL | 1 refills | Status: DC
Start: 2016-09-23 — End: 2016-11-11

## 2016-09-25 ENCOUNTER — Ambulatory Visit (AMBULATORY_SURGERY_CENTER): Payer: 59 | Admitting: Gastroenterology

## 2016-09-25 ENCOUNTER — Encounter: Payer: Self-pay | Admitting: Gastroenterology

## 2016-09-25 VITALS — BP 120/62 | HR 62 | Temp 97.7°F | Resp 15 | Ht 68.0 in | Wt 251.0 lb

## 2016-09-25 DIAGNOSIS — K6289 Other specified diseases of anus and rectum: Secondary | ICD-10-CM | POA: Diagnosis not present

## 2016-09-25 MED ORDER — SODIUM CHLORIDE 0.9 % IV SOLN: 500.0000 mL | INTRAVENOUS | Status: DC

## 2016-09-25 MED ORDER — HYOSCYAMINE SULFATE ER 0.375 MG PO TB12: 0.3750 mg | ORAL_TABLET | Freq: Two times a day (BID) | ORAL | 0 refills | Status: DC

## 2016-09-25 NOTE — Op Note (Signed)
[facility] Patient Name: Cherrie DistanceJason Howdyshell Procedure Date: 09/25/2016 7:38 AM MRN: [id number] Endoscopist: Sherilyn CooterHenry L. Myrtie Neitheranis , MD Age: [age] Referring MD:  Date of Birth: May 02, 1971 Gender: Male Account #: [id number] Procedure:                Colonoscopy Indications:              Rectal pain Medicines:                Monitored Anesthesia Care Procedure:                Pre-Anesthesia Assessment:                           - Prior to the procedure, a History and Physical                            was performed, and patient medications and                            allergies were reviewed. The patient's tolerance of                            previous anesthesia was also reviewed. The risks                            and benefits of the procedure and the sedation                            options and risks were discussed with the patient.                            All questions were answered, and informed consent                            was obtained. Prior Anticoagulants: The patient has                            taken no previous anticoagulant or antiplatelet                            agents. ASA Grade Assessment: II - A patient with                            mild systemic disease. After reviewing the risks                            and benefits, the patient was deemed in                            satisfactory condition to undergo the procedure.                           After obtaining informed consent, the colonoscope  was passed under direct vision. Throughout the                            procedure, the patient's blood pressure, pulse, and                            oxygen saturations were monitored continuously. The                            Model CF-HQ190L [id number]) scope was introduced                            through the anus and advanced to the the cecum,                            identified by appendiceal orifice and ileocecal                           valve. The colonoscopy was performed without                            difficulty. The patient tolerated the procedure                            well. The quality of the bowel preparation was                            good. The ileocecal valve, appendiceal orifice, and                            rectum were photographed. The bowel preparation                            used was SUPREP. Scope In: 8:04:24 AM Scope Out: 8:20:15 AM Scope Withdrawal Time: 0 hours 6 minutes 56 seconds  Total Procedure Duration: 0 hours 15 minutes 51 seconds  Findings:                 The perianal and digital rectal examinations were                            normal. Pertinent negatives include normal                            sphincter tone, no palpable rectal lesions and no                            anal fissure.                           The entire examined colon appeared normal on direct                            and retroflexion views. Complications:            No immediate complications.  Estimated Blood Loss:     Estimated blood loss: none. Impression:               - The entire examined colon is normal on direct and                            retroflexion views.                           - No specimens collected. Recommendation:           - Patient has a contact number available for                            emergencies. The signs and symptoms of potential                            delayed complications were discussed with the                            patient. Return to normal activities tomorrow.                            Written discharge instructions were provided to the                            patient.                           - Resume previous diet.                           - Continue present medications.                           - Repeat colonoscopy in 10 years for screening                            purposes.                           - Use Levbid 0.375  mg Extended Tabs 1 tab PO BID                            for 2 weeks.                           - Return to primary care physician as scheduled. Sophie Quiles L. Myrtie Neither, MD 09/25/2016 8:27:36 AM This report has been signed electronically.

## 2016-09-25 NOTE — Progress Notes (Signed)
To PACU Pt awake and alert Repor to RN

## 2016-09-25 NOTE — Patient Instructions (Signed)
YOU HAD AN ENDOSCOPIC PROCEDURE TODAY AT THE [facility]:   Refer to the procedure report that was given to you for any specific questions about what was found during the examination.  If the procedure report does not answer your questions, please call your gastroenterologist to clarify.  If you requested that your care partner not be given the details of your procedure findings, then the procedure report has been included in a sealed envelope for you to review at your convenience later.  YOU SHOULD EXPECT: Some feelings of bloating in the abdomen. Passage of more gas than usual.  Walking can help get rid of the air that was put into your GI tract during the procedure and reduce the bloating. If you had a lower endoscopy (such as a colonoscopy or flexible sigmoidoscopy) you may notice spotting of blood in your stool or on the toilet paper. If you underwent a bowel prep for your procedure, you may not have a normal bowel movement for a few days.  Please Note:  You might notice some irritation and congestion in your nose or some drainage.  This is from the oxygen used during your procedure.  There is no need for concern and it should clear up in a day or so.  SYMPTOMS TO REPORT IMMEDIATELY:   Following lower endoscopy (colonoscopy or flexible sigmoidoscopy):  Excessive amounts of blood in the stool  Significant tenderness or worsening of abdominal pains  Swelling of the abdomen that is new, acute  Fever of 100F or higher  For urgent or emergent issues, a gastroenterologist can be reached at any hour by calling [phone].   DIET:  We do recommend a small meal at first, but then you may proceed to your regular diet.  Drink plenty of fluids but you should avoid alcoholic beverages for 24 hours.  ACTIVITY:  You should plan to take it easy for the rest of today and you should NOT DRIVE or use heavy machinery until tomorrow (because of the sedation medicines used during the test).     FOLLOW UP: Our staff will call the number listed on your records the next business day following your procedure to check on you and address any questions or concerns that you may have regarding the information given to you following your procedure. If we do not reach you, we will leave a message.  However, if you are feeling well and you are not experiencing any problems, there is no need to return our call.  We will assume that you have returned to your regular daily activities without incident.  If any biopsies were taken you will be contacted by phone or by letter within the next 1-3 weeks.  Please call us at [phone] if you have not heard about the biopsies in 3 weeks.    SIGNATURES/CONFIDENTIALITY: You and/or your care partner have signed paperwork which will be entered into your electronic medical record.  These signatures attest to the fact that that the information above on your After Visit Summary has been reviewed and is understood.  Full responsibility of the confidentiality of this discharge information lies with you and/or your care-partner. 

## 2016-09-26 ENCOUNTER — Telehealth: Payer: Self-pay | Admitting: *Deleted

## 2016-09-26 NOTE — Telephone Encounter (Signed)
  Follow up Call-  Call back number 09/25/2016  comments NO VOICEMAIL  Some encounter information is confidential and restricted. Go to Review Flowsheets activity to see all data.  Some recent data might be hidden     Patient questions:  Do you have a fever, pain , or abdominal swelling? No. Pain Score  0 *  Have you tolerated food without any problems? Yes.    Have you been able to return to your normal activities? Yes.    Do you have any questions about your discharge instructions: Diet   No. Medications  No. Follow up visit  No.  Do you have questions or concerns about your Care? No.  Actions: * If pain score is 4 or above: No action needed, pain <4.

## 2016-09-29 ENCOUNTER — Other Ambulatory Visit: Payer: Self-pay | Admitting: Internal Medicine

## 2016-09-29 DIAGNOSIS — N400 Enlarged prostate without lower urinary tract symptoms: Secondary | ICD-10-CM

## 2016-11-10 ENCOUNTER — Other Ambulatory Visit: Payer: Self-pay | Admitting: Internal Medicine

## 2016-11-11 ENCOUNTER — Ambulatory Visit (INDEPENDENT_AMBULATORY_CARE_PROVIDER_SITE_OTHER): Payer: 59 | Admitting: Physician Assistant

## 2016-11-11 ENCOUNTER — Encounter: Payer: Self-pay | Admitting: Physician Assistant

## 2016-11-11 VITALS — BP 126/74 | HR 90 | Temp 97.1°F | Resp 16 | Ht 68.0 in | Wt 264.0 lb

## 2016-11-11 DIAGNOSIS — E559 Vitamin D deficiency, unspecified: Secondary | ICD-10-CM | POA: Diagnosis not present

## 2016-11-11 DIAGNOSIS — R7303 Prediabetes: Secondary | ICD-10-CM

## 2016-11-11 DIAGNOSIS — E785 Hyperlipidemia, unspecified: Secondary | ICD-10-CM

## 2016-11-11 DIAGNOSIS — Z79899 Other long term (current) drug therapy: Secondary | ICD-10-CM | POA: Diagnosis not present

## 2016-11-11 DIAGNOSIS — E291 Testicular hypofunction: Secondary | ICD-10-CM | POA: Diagnosis not present

## 2016-11-11 DIAGNOSIS — I10 Essential (primary) hypertension: Secondary | ICD-10-CM | POA: Diagnosis not present

## 2016-11-11 DIAGNOSIS — K594 Anal spasm: Secondary | ICD-10-CM | POA: Diagnosis not present

## 2016-11-11 LAB — LIPID PANEL
Cholesterol: 128 mg/dL (ref <200)
HDL: 38 mg/dL — ABNORMAL LOW (ref >40)
LDL Cholesterol: 57 mg/dL (ref <100)
Total CHOL/HDL Ratio: 3.4 Ratio (ref <5.0)
Triglycerides: 165 mg/dL — ABNORMAL HIGH (ref <150)
VLDL: 33 mg/dL — ABNORMAL HIGH (ref <30)

## 2016-11-11 LAB — CBC WITH DIFFERENTIAL/PLATELET
Basophils Absolute: 59 cells/uL (ref 0–200)
Basophils Relative: 1 %
Eosinophils Absolute: 236 cells/uL (ref 15–500)
Eosinophils Relative: 4 %
HCT: 43.5 % (ref 38.5–50.0)
Hemoglobin: 15 g/dL (ref 13.2–17.1)
Lymphocytes Relative: 23 %
Lymphs Abs: 1357 cells/uL (ref 850–3900)
MCH: 31.5 pg (ref 27.0–33.0)
MCHC: 34.5 g/dL (ref 32.0–36.0)
MCV: 91.4 fL (ref 80.0–100.0)
MPV: 11.3 fL (ref 7.5–12.5)
Monocytes Absolute: 767 cells/uL (ref 200–950)
Monocytes Relative: 13 %
Neutro Abs: 3481 cells/uL (ref 1500–7800)
Neutrophils Relative %: 59 %
RBC: 4.76 MIL/uL (ref 4.20–5.80)
RDW: 13.5 % (ref 11.0–15.0)
WBC: 5.9 10*3/uL (ref 3.8–10.8)

## 2016-11-11 LAB — HEPATIC FUNCTION PANEL
ALT: 26 U/L (ref 9–46)
AST: 20 U/L (ref 10–40)
Albumin: 4.3 g/dL (ref 3.6–5.1)
Alkaline Phosphatase: 68 U/L (ref 40–115)
Bilirubin, Direct: 0.1 mg/dL (ref <=0.2)
Indirect Bilirubin: 0.6 mg/dL (ref 0.2–1.2)
Total Bilirubin: 0.7 mg/dL (ref 0.2–1.2)
Total Protein: 6.6 g/dL (ref 6.1–8.1)

## 2016-11-11 LAB — BASIC METABOLIC PANEL WITH GFR
BUN: 12 mg/dL (ref 7–25)
CO2: 30 mmol/L (ref 20–31)
Calcium: 9.2 mg/dL (ref 8.6–10.3)
Chloride: 105 mmol/L (ref 98–110)
Creat: 1.05 mg/dL (ref 0.60–1.35)
GFR, Est African American: >89 mL/min (ref >=60)
GFR, Est Non African American: 85 mL/min (ref >=60)
Glucose, Bld: 105 mg/dL — ABNORMAL HIGH (ref 65–99)
Potassium: 4.1 mmol/L (ref 3.5–5.3)
Sodium: 140 mmol/L (ref 135–146)

## 2016-11-11 LAB — TSH: TSH: 2.27 mIU/L (ref 0.40–4.50)

## 2016-11-11 MED ORDER — DULOXETINE HCL 30 MG PO CPEP: 30.0000 mg | ORAL_CAPSULE | Freq: Every day | ORAL | 2 refills | Status: DC

## 2016-11-11 NOTE — Progress Notes (Signed)
Assessment and Plan:  Hypertension:  -Continue medication -monitor blood pressure at home. -Continue DASH diet -Reminder to go to the ER if any CP, SOB, nausea, dizziness, severe HA, changes vision/speech, left arm numbness and tingling and jaw pain.  Cholesterol - Continue diet and exercise -Check cholesterol.   PreDiabetes without complications -Continue diet and exercise.  -Check A1C  Vitamin D Def -check level -continue medications.   Proctalgia fugax difficult with defecation Will add on cymabalta 30mg , stop zoloft Suggest pelvic floor PT  Continue cardizem  Continue diet and meds as discussed. Further disposition pending results of labs. Discussed med's effects and SE's.    HPI 45 y.o. male  presents for 3 month follow up with hypertension, hyperlipidemia, diabetes and vitamin D deficiency.   His blood pressure has been controlled at home, today their BP is BP: 126/74.He does not workout. He denies chest pain, shortness of breath, dizziness.   He is on cholesterol medication and denies myalgias. His cholesterol is at goal. The cholesterol was:  08/12/2016: Cholesterol 171; HDL 35; LDL Cholesterol 98; Triglycerides 192   He has been working on diet and exercise for prediabetes without complications, he is not on bASA, he is not on ACE/ARB, and denies  foot ulcerations, hyperglycemia, hypoglycemia , increased appetite, nausea, paresthesia of the feet, polydipsia, polyuria, visual disturbances, vomiting and weight loss. Last A1C was: 08/12/2016: Hgb A1c MFr Bld 5.2   Patient is on Vitamin D supplement. 08/12/2016: Vit D, 25-Hydroxy 47   Continue to have rectal spasms, occ wakening him from sleep, xanax helps, states started after switch to zoloft/wellbutrin/phentermine- has had normal colonoscopy/exam. On cardizem at this time without help. Has trouble with complete defecation.   BMI is Body mass index is 40.14 kg/m., he is working on diet and exercise. Wt Readings from Last  3 Encounters:  11/11/16 264 lb (119.7 kg)  09/25/16 251 lb (113.9 kg)  09/11/16 251 lb (113.9 kg)    Current Medications:  Current Outpatient Prescriptions on File Prior to Visit  Medication Sig Dispense Refill  . ALPRAZolam (XANAX) 1 MG tablet TAKE 1/2 TO 1 TABLET 2 TO 3 TIMES A DAY AS NEEDED FOR ANXIETY 90 tablet 0  . atorvastatin (LIPITOR) 80 MG tablet TAKE 1 TABLET BY MOUTH EVERY DAY 90 tablet 1  . Cholecalciferol (VITAMIN D3) 2000 units TABS Take 6,000 tablets by mouth daily.     Marland Kitchen. diltiazem (CARDIZEM) 60 MG tablet Take 1 tablet (60 mg total) by mouth 3 (three) times daily. 90 tablet 5  . diphenhydrAMINE (BENADRYL) 25 MG tablet Take 25 mg by mouth every 6 (six) hours as needed for allergies.    . hyoscyamine (LEVBID) 0.375 MG 12 hr tablet Take 1 tablet (0.375 mg total) by mouth 2 (two) times daily. 60 tablet 0  . tamsulosin (FLOMAX) 0.4 MG CAPS capsule TAKE 1 CAPSULE AT BEDTIME FOR PROSTATE 90 capsule 1   Current Facility-Administered Medications on File Prior to Visit  Medication Dose Route Frequency Provider Last Rate Last Dose  . 0.9 %  sodium chloride infusion  500 mL Intravenous Continuous Sherrilyn RistHenry L Danis III, MD       Medical History:  Past Medical History:  Diagnosis Date  . Anxiety   . Borderline hypertension   . BPH (benign prostatic hypertrophy)   . Depression   . Hyperlipidemia   . Hypogonadism male   . 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 Obesity (BMI 30-39.9)   .  OSA (obstructive sleep apnea)    pt stopped using cpap June 2017 because he lost wt  . Prediabetes   . Right ureteral stone   . Sleep apnea    C PAP  . Vitamin D deficiency   . Wears glasses    Allergies:  Allergies  Allergen Reactions  . Codeine Other (See Comments)    "can's sleep"  . Effexor [Venlafaxine] Other (See Comments)    ED  . Prednisone Other (See Comments)    "can't sleep"  . Tramadol Hcl Itching  . Zinc Other (See Comments)    "stomache upset"      Review of Systems:  Review of Systems  Constitutional: Positive for malaise/fatigue. Negative for chills and fever.  HENT: Negative for congestion, ear pain and sore throat.   Eyes: Negative.   Respiratory: Negative for cough, shortness of breath and wheezing.   Cardiovascular: Negative for chest pain, palpitations and leg swelling.  Gastrointestinal: Positive for constipation (rectal pain). Negative for abdominal pain, blood in stool, diarrhea, heartburn and melena.  Genitourinary: Negative.   Skin: Negative.   Neurological: Negative for dizziness, sensory change, loss of consciousness and headaches.  Psychiatric/Behavioral: Positive for depression. The patient is nervous/anxious and has insomnia.     Family history- Review and unchanged  Social history- Review and unchanged  Physical Exam: BP 126/74   Pulse 90   Temp 97.1 F (36.2 C)   Resp 16   Ht 5\' 8"  (1.727 m)   Wt 264 lb (119.7 kg)   SpO2 97%   BMI 40.14 kg/m  Wt Readings from Last 3 Encounters:  11/11/16 264 lb (119.7 kg)  09/25/16 251 lb (113.9 kg)  09/11/16 251 lb (113.9 kg)   General Appearance: Well nourished well developed, non-toxic appearing, in no apparent distress. Eyes: PERRLA, EOMs, conjunctiva no swelling or erythema ENT/Mouth: Ear canals clear with no erythema, swelling, or discharge.  TMs normal bilaterally, oropharynx clear, moist, with no exudate.   Neck: Supple, thyroid normal, no JVD, no cervical adenopathy.  Respiratory: Respiratory effort normal, breath sounds clear A&P, no wheeze, rhonchi or rales noted.  No retractions, no accessory muscle usage Cardio: RRR with no MRGs. No noted edema.  Abdomen: Soft, + BS.  Non tender, no guarding, rebound, hernias, masses. Musculoskeletal: Full ROM, 5/5 strength, Normal gait Skin: Warm, dry without rashes, lesions, ecchymosis.  Neuro: Awake and oriented X 3, Cranial nerves intact. No cerebellar symptoms.  Psych: flat affect, Insight and Judgment  appropriate.    Quentin MullingAmanda Philomina Leon, PA-C 10:37 AM [facility]

## 2016-11-11 NOTE — Patient Instructions (Signed)
Stop the zoloft and start Cymbalta daily  We want weight loss that will last so you should lose 1-2 pounds a week.  THAT IS IT! Please pick THREE things a month to change. Once it is a habit check off the item. Then pick another three items off the list to become habits.  If you are already doing a habit on the list GREAT!  Cross that item off! o Don't drink your calories. Ie, alcohol, soda, fruit juice, and sweet tea.  o Drink more water. Drink a glass when you feel hungry or before each meal.  o Eat breakfast - Complex carb and protein (likeDannon light and fit yogurt, oatmeal, fruit, eggs, Malaw[place]urkey bacon). o Measure your cereal.  Eat no more than one cup a day. (ie [place]ashi) o Eat an apple a day. o Add a vegetable a day. o Try a new vegetable a month. o Use Pam! Stop using oil or butter to cook. o Don't finish your plate or use smaller plates. o Share your dessert. o Eat sugar free Jello for dessert or frozen grapes. o Don't eat 2-3 hours before bed. o Switch to whole wheat bread, pasta, and brown rice. o Make healthier choices when you eat out. No fries! o Pick baked chicken, NOT fried. o Don't forget to SLOW DOWN when you eat. It is not going anywhere.  o Take the stairs. o Park far away in the parking lot o State Farm[employer]ift soup cans (or weights) for 10 minutes while watching TV. o Walk at work for 10 minutes during break. o Walk outside 1 time a week with your friend, kids, dog, or significant other. o Start a walking group at church. o Walk the mall as much as you can tolerate.  o Keep a food diary. o Weigh yourself daily. o Walk for 15 minutes 3 days per week. o Cook at home more often and eat out less.  If life happens and you go back to old habits, it is okay.  Just start over. You can do it!   If you experience chest pain, get short of breath, or tired during the exercise, please stop immediately and inform your doctor.

## 2016-11-12 ENCOUNTER — Ambulatory Visit: Payer: Self-pay | Admitting: Physician Assistant

## 2017-02-10 ENCOUNTER — Other Ambulatory Visit: Payer: Self-pay | Admitting: Physician Assistant

## 2017-02-10 DIAGNOSIS — K594 Anal spasm: Secondary | ICD-10-CM

## 2017-02-11 ENCOUNTER — Other Ambulatory Visit: Payer: Self-pay | Admitting: *Deleted

## 2017-02-11 DIAGNOSIS — N400 Enlarged prostate without lower urinary tract symptoms: Secondary | ICD-10-CM

## 2017-02-11 MED ORDER — TAMSULOSIN HCL 0.4 MG PO CAPS: ORAL_CAPSULE | ORAL | 1 refills | Status: DC

## 2017-02-17 NOTE — Patient Instructions (Signed)

## 2017-02-17 NOTE — Progress Notes (Addendum)
[facility]   [name], M.D.    Dyanne Carrel. Steffanie Dunn, P.A.-C      Terri Piedra, P.A.-C  [facility]                [address]                [place], [place]. [address] Telephone [phone] Telefax [phone] Annual  Screening/Preventative Visit  & Comprehensive Evaluation & Examination     This very nice 47 y.o. MWM presents for a Screening/Preventative Visit & comprehensive evaluation and management of multiple medical co-morbidities.  Patient has been followed for HTN, T2_NIDDM  Prediabetes, Hyperlipidemia and Vitamin D Deficiency. Patient also has OSA on CPAP with improved restorative sleep and less daytime fatigue.      HTN predates since 2010. Patient's BP has been controlled at home.  Today's BP is at goal -  110/78. Patient denies any cardiac symptoms as chest pain, palpitations, shortness of breath, dizziness or ankle swelling.     Patient's hyperlipidemia is controlled with diet and medications. Patient denies myalgias or other medication SE's. Last lipids were at goal albeit sl elevated Trig's:  Lab Results  Component Value Date   CHOL 128 11/11/2016   HDL 38 (L) 11/11/2016   LDLCALC 57 11/11/2016   TRIG 165 (H) 11/11/2016   CHOLHDL 3.4 11/11/2016      Patient has Morbid Obesity  (BMI 37+) and consequent prediabetes (A1c 5.8% in 2016)  and patient denies reactive hypoglycemic symptoms, visual blurring, diabetic polys or paresthesias. Last A1c was at goal: Lab Results  Component Value Date   HGBA1C 5.2 08/12/2016       Finally, patient has history of Vitamin D Deficiency ("33" in 2008 and "29" in 2016)) and last vitamin D was still low (goal 70-100):  Lab Results  Component Value Date   VD25OH 47 08/12/2016   Current Outpatient Prescriptions on File Prior to Visit  Medication Sig  . ALPRAZolam (XANAX) 1 MG tablet TAKE 1/2 TO 1 TABLET 2 TO 3 TIMES A DAY AS NEEDED FOR ANXIETY  .  atorvastatin (LIPITOR) 80 MG tablet TAKE 1 TABLET BY MOUTH EVERY DAY  . VITAMIN D 2000 units  Take 6,000 daily.   . diphenhydrAMINE  25 MG  Take  every 6  hrs as needed for allergies.  . DULoxetine  30 MG c TAKE ONE CAP EVERY DAY  . hyoscyamine 0.375 MG  Take 1 tab 2  times daily.  . tamsulosin  0.4 MG CAPS  TAKE 1 CAP AT BEDTIME    Allergies  Allergen Reactions  . Codeine Other (See Comments)    "can's sleep"  . Effexor [Venlafaxine] Other (See Comments)    ED  . Prednisone Other (See Comments)    "can't sleep"  . Tramadol Hcl Itching  . Zinc Other (See Comments)    "stomache upset"   Past Medical History:  Diagnosis Date  . Anxiety   . Borderline hypertension   . BPH (benign prostatic hypertrophy)   . Depression   . Hyperlipidemia   . Hypogonadism male   . 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 Obesity (BMI 30-39.9)   . OSA (obstructive sleep apnea)    pt stopped using cpap June 2017 because he lost wt  . Prediabetes   . Right ureteral stone   . Sleep apnea    C PAP  . Vitamin  D deficiency   . Wears glasses    Health Maintenance  Topic Date Due  . INFLUENZA VACCINE  07/29/2016  . TETANUS/TDAP  05/31/2024  . HIV Screening  Completed   Immunization History  Administered Date(s) Administered  . Influenza-Unspecified 10/20/2014  . PPD Test 01/16/2015  . Pneumococcal-Unspecified 10/27/2006  . Tdap 08/05/2013, 05/31/2014   Past Surgical History:  Procedure Laterality Date  . 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 ARTERY REPAIR Right 05/31/2014   Procedure: IRRIGATION, EXPLORATION, AND REPAIR OF RIGHT ARM WOUND;  Surgeon: Cherylynn Ridges, MD;  Location: [facility] OR;  Service: General;  Laterality: Right;  . CYSTOSCOPY WITH RETROGRADE PYELOGRAM, URETEROSCOPY AND STENT PLACEMENT Right 07/11/2016   Procedure: CYSTOSCOPY WITH RETROGRADE PYELOGRAM, URETEROSCOPY, BASKET STONE  EXTRACTION  AND STENT PLACEMENT;  Surgeon: Sebastian Ache, MD;  Location: [facility];  Service: Urology;  Laterality: Right;  45 MINS  C-ARM DIGITAL URETEROSCOPE HOLMIUM LASER  . ESOPHAGOGASTRODUODENOSCOPY     Family History  Problem Relation Age of Onset  . Hypertension Mother   . Migraines Mother   . Thyroid disease Mother   . Hypertension Father   . Hyperlipidemia Father   . Diabetes Father   . Cancer Sister     thyroid   Social History  . Marital status: Married    Spouse name: N/A  . Number of children: 2  . Years of education: N/A   Occupational History  . Machinist/welder   Social History Main Topics  . Smoking status: Former Smoker    Packs/day: 0.50    Years: 10.00    Types: Cigarettes    Quit date: 10/27/2009  . Smokeless tobacco: Current User    Types: Snuff  . Alcohol use Yes     Comment: occasional  . Drug use: No  . Sexual activity: Yes     Comment: Vasectomy    ROS Constitutional: Denies fever, chills, weight loss/gain, headaches, insomnia,  night sweats or change in appetite. Does c/o fatigue. Eyes: Denies redness, blurred vision, diplopia, discharge, itchy or watery eyes.  ENT: Denies discharge, congestion, post nasal drip, epistaxis, sore throat, earache, hearing loss, dental pain, Tinnitus, Vertigo, Sinus pain or snoring.  Cardio: Denies chest pain, palpitations, irregular heartbeat, syncope, dyspnea, diaphoresis, orthopnea, PND, claudication or edema Respiratory: denies cough, dyspnea, DOE, pleurisy, hoarseness, laryngitis or wheezing.  Gastrointestinal: Denies dysphagia, heartburn, reflux, water brash, pain, cramps, nausea, vomiting, bloating, diarrhea, constipation, hematemesis, melena, hematochezia, jaundice or hemorrhoids Genitourinary: Denies dysuria, frequency, urgency, nocturia, hesitancy, discharge, hematuria or flank pain Musculoskeletal: Denies arthralgia, myalgia, stiffness, Jt. Swelling, pain, limp or strain/sprain.  Denies Falls. Skin: Denies puritis, rash, hives, warts, acne, eczema or change in skin lesion Neuro: No weakness, tremor, incoordination, spasms, paresthesia or pain Psychiatric: Denies confusion, memory loss or sensory loss. Denies Depression. Endocrine: Denies change in weight, skin, hair change, nocturia, and paresthesia, diabetic polys, visual blurring or hyper / hypo glycemic episodes.  Heme/Lymph: No excessive bleeding, bruising or enlarged lymph nodes.  Physical Exam  BP 110/78   Pulse 92   Temp 97.5 F (36.4 C)   Resp 16   Ht 5\' 10"  (1.778 m)   Wt 264 lb (119.7 kg)   BMI 37.88 kg/m   General Appearance: Over nourished, in no apparent distress.  Eyes: PERRLA, EOMs, conjunctiva no swelling or erythema, normal fundi and vessels. Sinuses: No frontal/maxillary tenderness ENT/Mouth: EACs patent / TMs  nl.  Nares clear without erythema, swelling, mucoid exudates. Oral hygiene is good. No erythema, swelling, or exudate. Tongue normal, non-obstructing. Tonsils not swollen or erythematous. Hearing normal.  Neck: Supple, thyroid normal. No bruits, nodes or JVD. Respiratory: Respiratory effort normal.  BS equal and clear bilateral without rales, rhonci, wheezing or stridor. Cardio: Heart sounds are normal with regular rate and rhythm and no murmurs, rubs or gallops. Peripheral pulses are normal and equal bilaterally without edema. No aortic or femoral bruits. Chest: symmetric with normal excursions and percussion.  Abdomen: Soft, with Nl bowel sounds. Nontender, no guarding, rebound, hernias, masses, or organomegaly.  Lymphatics: Non tender without lymphadenopathy.  Genitourinary: Deferred due to recent negative colonoscopy. Musculoskeletal: Full ROM all peripheral extremities, joint stability, 5/5 strength, and normal gait. Skin: Warm and dry without rashes, lesions, cyanosis, clubbing or  ecchymosis.  Neuro: Cranial nerves intact, reflexes equal bilaterally. Normal muscle tone, no  cerebellar symptoms. Sensation intact.  Pysch: Alert and oriented X 3 with normal affect, insight and judgment appropriate.   Assessment and Plan  1. Annual Preventative/Screening Exam   2. Essential hypertension  - Microalbumin / creatinine urine ratio - EKG 12-Lead - Urinalysis, Routine w reflex microscopic - CBC with Differential/Platelet - BASIC METABOLIC PANEL WITH GFR - TSH  3. Mixed hyperlipidemia  - EKG 12-Lead - Hepatic function panel - Lipid panel - TSH  4. Prediabetes  - EKG 12-Lead - Hemoglobin A1c - Insulin, random  5. Vitamin D deficiency  - VITAMIN D 25 Hydroxy   6. Testosterone deficiency  - Testosterone  7. Morbid obesity (HCC)   8. OSA on CPAP   9. Screening for rectal cancer   10. Prostate cancer screening  - PSA  11. Screening for ischemic heart disease  - EKG 12-Lead  12. Fatigue  - Vitamin B12 - Iron and TIBC - Testosterone - CBC with Differential/Platelet - TSH  13. Medication management  - Urinalysis, Routine w reflex microscopic - CBC with Differential/Platelet - BASIC METABOLIC PANEL WITH GFR - Hepatic function panel - Magnesium - Lipid panel - TSH  14. Screening examination for pulmonary tuberculosis  - PPD       Continue prudent diet as discussed, weight control, BP monitoring, regular exercise, and medications as discussed.  Discussed med effects and SE's. Routine screening labs and tests as requested with regular follow-up as recommended. Over 40 minutes of exam, counseling, chart review and high complex critical decision making was performed

## 2017-02-18 ENCOUNTER — Encounter: Payer: Self-pay | Admitting: Internal Medicine

## 2017-02-18 ENCOUNTER — Ambulatory Visit (INDEPENDENT_AMBULATORY_CARE_PROVIDER_SITE_OTHER): Payer: 59 | Admitting: Internal Medicine

## 2017-02-18 VITALS — BP 110/78 | HR 92 | Temp 97.5°F | Resp 16 | Ht 70.0 in | Wt 264.0 lb

## 2017-02-18 DIAGNOSIS — R5383 Other fatigue: Secondary | ICD-10-CM

## 2017-02-18 DIAGNOSIS — Z125 Encounter for screening for malignant neoplasm of prostate: Secondary | ICD-10-CM

## 2017-02-18 DIAGNOSIS — E291 Testicular hypofunction: Secondary | ICD-10-CM

## 2017-02-18 DIAGNOSIS — Z136 Encounter for screening for cardiovascular disorders: Secondary | ICD-10-CM

## 2017-02-18 DIAGNOSIS — E782 Mixed hyperlipidemia: Secondary | ICD-10-CM

## 2017-02-18 DIAGNOSIS — I10 Essential (primary) hypertension: Secondary | ICD-10-CM | POA: Diagnosis not present

## 2017-02-18 DIAGNOSIS — Z79899 Other long term (current) drug therapy: Secondary | ICD-10-CM

## 2017-02-18 DIAGNOSIS — R7303 Prediabetes: Secondary | ICD-10-CM

## 2017-02-18 DIAGNOSIS — Z0001 Encounter for general adult medical examination with abnormal findings: Secondary | ICD-10-CM

## 2017-02-18 DIAGNOSIS — Z111 Encounter for screening for respiratory tuberculosis: Secondary | ICD-10-CM

## 2017-02-18 DIAGNOSIS — Z0000 Encounter for general adult medical examination without abnormal findings: Secondary | ICD-10-CM

## 2017-02-18 DIAGNOSIS — E559 Vitamin D deficiency, unspecified: Secondary | ICD-10-CM

## 2017-02-18 DIAGNOSIS — Z9989 Dependence on other enabling machines and devices: Secondary | ICD-10-CM

## 2017-02-18 DIAGNOSIS — G4733 Obstructive sleep apnea (adult) (pediatric): Secondary | ICD-10-CM

## 2017-02-18 DIAGNOSIS — Z1212 Encounter for screening for malignant neoplasm of rectum: Secondary | ICD-10-CM

## 2017-02-18 LAB — BASIC METABOLIC PANEL WITH GFR
BUN: 13 mg/dL (ref 7–25)
CO2: 27 mmol/L (ref 20–31)
Calcium: 9.3 mg/dL (ref 8.6–10.3)
Chloride: 103 mmol/L (ref 98–110)
Creat: 1.53 mg/dL — ABNORMAL HIGH (ref 0.60–1.35)
GFR, Est African American: 62 mL/min (ref >=60)
GFR, Est Non African American: 53 mL/min — ABNORMAL LOW (ref >=60)
Glucose, Bld: 120 mg/dL — ABNORMAL HIGH (ref 65–99)
Potassium: 3.8 mmol/L (ref 3.5–5.3)
Sodium: 139 mmol/L (ref 135–146)

## 2017-02-18 LAB — IRON AND TIBC
%SAT: 29 % (ref 15–60)
Iron: 92 ug/dL (ref 50–180)
TIBC: 320 ug/dL (ref 250–425)
UIBC: 228 ug/dL (ref 125–400)

## 2017-02-18 LAB — CBC WITH DIFFERENTIAL/PLATELET
Basophils Absolute: 61 cells/uL (ref 0–200)
Basophils Relative: 1 %
Eosinophils Absolute: 305 cells/uL (ref 15–500)
Eosinophils Relative: 5 %
HCT: 45.1 % (ref 38.5–50.0)
Hemoglobin: 15.9 g/dL (ref 13.2–17.1)
Lymphocytes Relative: 24 %
Lymphs Abs: 1464 cells/uL (ref 850–3900)
MCH: 32.3 pg (ref 27.0–33.0)
MCHC: 35.3 g/dL (ref 32.0–36.0)
MCV: 91.7 fL (ref 80.0–100.0)
MPV: 10.6 fL (ref 7.5–12.5)
Monocytes Absolute: 549 cells/uL (ref 200–950)
Monocytes Relative: 9 %
Neutro Abs: 3721 cells/uL (ref 1500–7800)
Neutrophils Relative %: 61 %
RBC: 4.92 MIL/uL (ref 4.20–5.80)
RDW: 13.6 % (ref 11.0–15.0)
WBC: 6.1 10*3/uL (ref 3.8–10.8)

## 2017-02-18 LAB — LIPID PANEL
Cholesterol: 141 mg/dL (ref <200)
HDL: 32 mg/dL — ABNORMAL LOW (ref >40)
LDL Cholesterol: 66 mg/dL (ref <100)
Total CHOL/HDL Ratio: 4.4 Ratio (ref <5.0)
Triglycerides: 213 mg/dL — ABNORMAL HIGH (ref <150)
VLDL: 43 mg/dL — ABNORMAL HIGH (ref <30)

## 2017-02-18 LAB — PSA: PSA: 0.4 ng/mL (ref <=4.0)

## 2017-02-18 LAB — HEPATIC FUNCTION PANEL
ALT: 33 U/L (ref 9–46)
AST: 21 U/L (ref 10–40)
Albumin: 4.4 g/dL (ref 3.6–5.1)
Alkaline Phosphatase: 75 U/L (ref 40–115)
Bilirubin, Direct: 0.2 mg/dL (ref <=0.2)
Indirect Bilirubin: 0.6 mg/dL (ref 0.2–1.2)
Total Bilirubin: 0.8 mg/dL (ref 0.2–1.2)
Total Protein: 6.7 g/dL (ref 6.1–8.1)

## 2017-02-18 LAB — VITAMIN B12: Vitamin B-12: 386 pg/mL (ref 200–1100)

## 2017-02-18 LAB — TSH: TSH: 1.46 mIU/L (ref 0.40–4.50)

## 2017-02-18 LAB — HEMOGLOBIN A1C
Hgb A1c MFr Bld: 5.4 % (ref <5.7)
Mean Plasma Glucose: 108 mg/dL

## 2017-02-18 MED ORDER — DULOXETINE HCL 60 MG PO CPEP: ORAL_CAPSULE | ORAL | 1 refills | Status: DC

## 2017-02-19 ENCOUNTER — Other Ambulatory Visit: Payer: Self-pay | Admitting: Internal Medicine

## 2017-02-19 DIAGNOSIS — N289 Disorder of kidney and ureter, unspecified: Secondary | ICD-10-CM

## 2017-02-19 LAB — VITAMIN D 25 HYDROXY (VIT D DEFICIENCY, FRACTURES): Vit D, 25-Hydroxy: 57 ng/mL (ref 30–100)

## 2017-02-19 LAB — URINALYSIS, ROUTINE W REFLEX MICROSCOPIC
Bilirubin Urine: NEGATIVE
Glucose, UA: NEGATIVE
Hgb urine dipstick: NEGATIVE
Ketones, ur: NEGATIVE
Leukocytes, UA: NEGATIVE
Nitrite: NEGATIVE
Protein, ur: NEGATIVE
Specific Gravity, Urine: 1.022 (ref 1.001–1.035)
pH: 5.5 (ref 5.0–8.0)

## 2017-02-19 LAB — MICROALBUMIN / CREATININE URINE RATIO
Creatinine, Urine: 250 mg/dL (ref 20–370)
Microalb Creat Ratio: 2 mcg/mg creat (ref <30)
Microalb, Ur: 0.5 mg/dL

## 2017-02-19 LAB — INSULIN, RANDOM: Insulin: 159.5 u[IU]/mL — ABNORMAL HIGH (ref 2.0–19.6)

## 2017-02-19 LAB — MAGNESIUM: Magnesium: 2 mg/dL (ref 1.5–2.5)

## 2017-02-19 LAB — TESTOSTERONE: Testosterone: 440 ng/dL (ref 250–827)

## 2017-05-25 NOTE — Progress Notes (Signed)
Assessment and Plan:  Hypertension:  -Continue medication -monitor blood pressure at home. -Continue DASH diet -Reminder to go to the ER if any CP, SOB, nausea, dizziness, severe HA, changes vision/speech, left arm numbness and tingling and jaw pain.  Cholesterol - Continue diet and exercise -Check cholesterol.   PreDiabetes without complications -Continue diet and exercise.  -Check A1C  Vitamin D Def -check level -continue medications.   Proctalgia fugax difficult with defecation Continue cymbalta for now Continue exercises  Try amitriptyline  Continue diet and meds as discussed. Further disposition pending results of labs. Discussed med's effects and SE's.   Future Appointments Date Time Provider Department Center  08/27/2017 3:30 PM Lucky CowboyMcKeown, William, MD [facility] None  03/23/2018 2:00 PM Lucky CowboyMcKeown, William, MD [facility] None    HPI 46 y.o. male  presents for 3 month follow up with hypertension, hyperlipidemia, diabetes and vitamin D deficiency.   His blood pressure has been controlled at home, today their BP is BP: 120/76.He does workout, has been swimming before work. He denies chest pain, shortness of breath, dizziness.   He is on cholesterol medication and denies myalgias. His cholesterol is at goal. The cholesterol was:  02/18/2017: Cholesterol 141; HDL 32; LDL Cholesterol 66; Triglycerides 213   He has been working on diet and exercise for prediabetes without complications, he is not on bASA, he is not on ACE/ARB, and denies  foot ulcerations, hyperglycemia, hypoglycemia , increased appetite, nausea, paresthesia of the feet, polydipsia, polyuria, visual disturbances, vomiting and weight loss. Last A1C was: 02/18/2017: Hgb A1c MFr Bld 5.4   Patient is on Vitamin D supplement. 02/18/2017: Vit D, 25-Hydroxy 57   Continue to have rectal spasms, occ wakening him from sleep, on cymbalta 60mg , has had normal colonoscopy/exam. Has trouble with complete defecation. Went to pelvic  PT that helped for a day or two but did not help long term.   BMI is Body mass index is 37.48 kg/m., he is working on diet and exercise. Wt Readings from Last 3 Encounters:  05/27/17 261 lb 3.2 oz (118.5 kg)  02/18/17 264 lb (119.7 kg)  11/11/16 264 lb (119.7 kg)    Current Medications:  Current Outpatient Prescriptions on File Prior to Visit  Medication Sig Dispense Refill  . ALPRAZolam (XANAX) 1 MG tablet TAKE 1/2 TO 1 TABLET 2 TO 3 TIMES A DAY AS NEEDED FOR ANXIETY 90 tablet 0  . atorvastatin (LIPITOR) 80 MG tablet TAKE 1 TABLET BY MOUTH EVERY DAY 90 tablet 1  . Cholecalciferol (VITAMIN D3) 2000 units TABS Take 6,000 tablets by mouth daily.     . diphenhydrAMINE (BENADRYL) 25 MG tablet Take 25 mg by mouth every 6 (six) hours as needed for allergies.    . DULoxetine (CYMBALTA) 60 MG capsule Take 1 capsule daily for Mood & Anxiety 90 capsule 1  . tamsulosin (FLOMAX) 0.4 MG CAPS capsule TAKE 1 CAPSULE AT BEDTIME FOR PROSTATE 90 capsule 1   No current facility-administered medications on file prior to visit.    Medical History:  Past Medical History:  Diagnosis Date  . Borderline hypertension   . BPH (benign prostatic hypertrophy)   . Hyperlipidemia   . Hypogonadism male   . 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 Obesity (BMI 30-39.9)   . Prediabetes   . Right ureteral stone   . Sleep apnea    C PAP  . Vitamin D deficiency   . Wears glasses    Allergies:  Allergies  Allergen Reactions  . Codeine Other (See Comments)    "can's sleep"  . Effexor [Venlafaxine] Other (See Comments)    ED  . Prednisone Other (See Comments)    "can't sleep"  . Tramadol Hcl Itching  . Zinc Other (See Comments)    "stomache upset"     Review of Systems:  Review of Systems  Constitutional: Positive for malaise/fatigue. Negative for chills and fever.  HENT: Negative for congestion, ear pain and sore throat.   Eyes: Negative.   Respiratory: Negative for cough,  shortness of breath and wheezing.   Cardiovascular: Negative for chest pain, palpitations and leg swelling.  Gastrointestinal: Positive for constipation (rectal pain). Negative for abdominal pain, blood in stool, diarrhea, heartburn and melena.  Genitourinary: Negative.   Skin: Negative.   Neurological: Negative for dizziness, sensory change, loss of consciousness and headaches.  Psychiatric/Behavioral: Positive for depression. The patient is nervous/anxious and has insomnia.     Family history- Review and unchanged  Social history- Review and unchanged  Physical Exam: BP 120/76   Pulse (!) 101   Temp 97.5 F (36.4 C)   Resp 18   Ht 5\' 10"  (1.778 m)   Wt 261 lb 3.2 oz (118.5 kg)   SpO2 96%   BMI 37.48 kg/m  Wt Readings from Last 3 Encounters:  05/27/17 261 lb 3.2 oz (118.5 kg)  02/18/17 264 lb (119.7 kg)  11/11/16 264 lb (119.7 kg)   General Appearance: Well nourished well developed, non-toxic appearing, in no apparent distress. Eyes: PERRLA, EOMs, conjunctiva no swelling or erythema ENT/Mouth: Ear canals clear with no erythema, swelling, or discharge.  TMs normal bilaterally, oropharynx clear, moist, with no exudate.   Neck: Supple, thyroid normal, no JVD, no cervical adenopathy.  Respiratory: Respiratory effort normal, breath sounds clear A&P, no wheeze, rhonchi or rales noted.  No retractions, no accessory muscle usage Cardio: RRR with no MRGs. No noted edema.  Abdomen: Soft, + BS.  Non tender, no guarding, rebound, hernias, masses. Musculoskeletal: Full ROM, 5/5 strength, Normal gait Skin: Warm, dry without rashes, lesions, ecchymosis.  Neuro: Awake and oriented X 3, Cranial nerves intact. No cerebellar symptoms.  Psych: flat affect, Insight and Judgment appropriate.    Quentin Mulling, PA-C 4:09 PM [facility]

## 2017-05-27 ENCOUNTER — Encounter: Payer: Self-pay | Admitting: Physician Assistant

## 2017-05-27 ENCOUNTER — Ambulatory Visit (INDEPENDENT_AMBULATORY_CARE_PROVIDER_SITE_OTHER): Payer: 59 | Admitting: Physician Assistant

## 2017-05-27 VITALS — BP 120/76 | HR 101 | Temp 97.5°F | Resp 18 | Ht 70.0 in | Wt 261.2 lb

## 2017-05-27 DIAGNOSIS — E782 Mixed hyperlipidemia: Secondary | ICD-10-CM | POA: Diagnosis not present

## 2017-05-27 DIAGNOSIS — E291 Testicular hypofunction: Secondary | ICD-10-CM | POA: Diagnosis not present

## 2017-05-27 DIAGNOSIS — I10 Essential (primary) hypertension: Secondary | ICD-10-CM

## 2017-05-27 DIAGNOSIS — R7303 Prediabetes: Secondary | ICD-10-CM

## 2017-05-27 DIAGNOSIS — Z79899 Other long term (current) drug therapy: Secondary | ICD-10-CM

## 2017-05-27 DIAGNOSIS — F3341 Major depressive disorder, recurrent, in partial remission: Secondary | ICD-10-CM | POA: Diagnosis not present

## 2017-05-27 LAB — HEPATIC FUNCTION PANEL
ALT: 41 U/L (ref 9–46)
AST: 24 U/L (ref 10–40)
Albumin: 4.5 g/dL (ref 3.6–5.1)
Alkaline Phosphatase: 79 U/L (ref 40–115)
Bilirubin, Direct: 0.2 mg/dL (ref <=0.2)
Indirect Bilirubin: 0.6 mg/dL (ref 0.2–1.2)
Total Bilirubin: 0.8 mg/dL (ref 0.2–1.2)
Total Protein: 6.8 g/dL (ref 6.1–8.1)

## 2017-05-27 LAB — BASIC METABOLIC PANEL WITH GFR
BUN: 12 mg/dL (ref 7–25)
CO2: 28 mmol/L (ref 20–31)
Calcium: 9.7 mg/dL (ref 8.6–10.3)
Chloride: 104 mmol/L (ref 98–110)
Creat: 1.42 mg/dL — ABNORMAL HIGH (ref 0.60–1.35)
GFR, Est African American: 68 mL/min (ref >=60)
GFR, Est Non African American: 58 mL/min — ABNORMAL LOW (ref >=60)
Glucose, Bld: 84 mg/dL (ref 65–99)
Potassium: 4.2 mmol/L (ref 3.5–5.3)
Sodium: 142 mmol/L (ref 135–146)

## 2017-05-27 LAB — LIPID PANEL
Cholesterol: 123 mg/dL (ref <200)
HDL: 35 mg/dL — ABNORMAL LOW (ref >40)
LDL Cholesterol: 54 mg/dL (ref <100)
Total CHOL/HDL Ratio: 3.5 Ratio (ref <5.0)
Triglycerides: 171 mg/dL — ABNORMAL HIGH (ref <150)
VLDL: 34 mg/dL — ABNORMAL HIGH (ref <30)

## 2017-05-27 LAB — TSH: TSH: 1.9 mIU/L (ref 0.40–4.50)

## 2017-05-27 MED ORDER — AMITRIPTYLINE HCL 25 MG PO TABS: ORAL_TABLET | ORAL | 0 refills | Status: DC

## 2017-05-27 NOTE — Patient Instructions (Addendum)
Do the tamsulosin at night Increase water, get 100 oz a day    Simple math prevails.    1st - exercise does not produce significant weight loss - at best one converts fat into muscle , "bulks up", loses inches, but usually stays "weight neutral"     2nd - think of your body weightas a check book: If you eat more calories than you burn up - you save money or gain weight .... Or if you spend more money than you put in the check book, ie burn up more calories than you eat, then you lose weight     3rd - if you walk or run 1 mile, you burn up 100 calories - you have to burn up 3,500 calories to lose 1 pound, ie you have to walk/run 35 miles to lose 1 measly pound. So if you want to lose 10 #, then you have to walk/run 350 miles, so.... clearly exercise is not the solution.     4. So if you consume 1,500 calories, then you have to burn up the equivalent of 15 miles to stay weight neutral - It also stands to reason that if you consume 1,500 cal/day and don't lose weight, then you must be burning up about 1,500 cals/day to stay weight neutral.     5. If you really want to lose weight, you must cut your calorie intake 300 calories /day and at that rate you should lose about 1 # every 3 days.   6. Please purchase Dr Francis DowseJoel Fuhrman's book(s) "The End of Dieting" & "Eat to Live" . It has some great concepts and recipes.

## 2017-05-28 ENCOUNTER — Other Ambulatory Visit: Payer: Self-pay | Admitting: Physician Assistant

## 2017-05-28 DIAGNOSIS — I10 Essential (primary) hypertension: Secondary | ICD-10-CM

## 2017-05-28 LAB — CBC WITH DIFFERENTIAL/PLATELET
Basophils Absolute: 74 cells/uL (ref 0–200)
Basophils Relative: 1 %
Eosinophils Absolute: 296 cells/uL (ref 15–500)
Eosinophils Relative: 4 %
HCT: 47.6 % (ref 38.5–50.0)
Hemoglobin: 16.4 g/dL (ref 13.2–17.1)
Lymphocytes Relative: 28 %
Lymphs Abs: 2072 cells/uL (ref 850–3900)
MCH: 31.8 pg (ref 27.0–33.0)
MCHC: 34.5 g/dL (ref 32.0–36.0)
MCV: 92.4 fL (ref 80.0–100.0)
MPV: 11.3 fL (ref 7.5–12.5)
Monocytes Absolute: 1036 cells/uL — ABNORMAL HIGH (ref 200–950)
Monocytes Relative: 14 %
Neutro Abs: 3922 cells/uL (ref 1500–7800)
Neutrophils Relative %: 53 %
RBC: 5.15 MIL/uL (ref 4.20–5.80)
RDW: 13.6 % (ref 11.0–15.0)
WBC: 7.4 10*3/uL (ref 3.8–10.8)

## 2017-05-28 LAB — HEMOGLOBIN A1C
Hgb A1c MFr Bld: 5.3 % (ref <5.7)
Mean Plasma Glucose: 105 mg/dL

## 2017-05-28 LAB — MAGNESIUM: Magnesium: 2.3 mg/dL (ref 1.5–2.5)

## 2017-06-13 IMAGING — US US RENAL
1 series · 14 of 25 positions shown · non-contrast
Comparison: 07/08/2016

CLINICAL DATA: Right flank pain.  Recent stent removal.

EXAM:
RENAL / URINARY TRACT ULTRASOUND COMPLETE

[Series 1: us renal · 0.28mm/px · 14 of 33 slices shown]
[im 1/33]
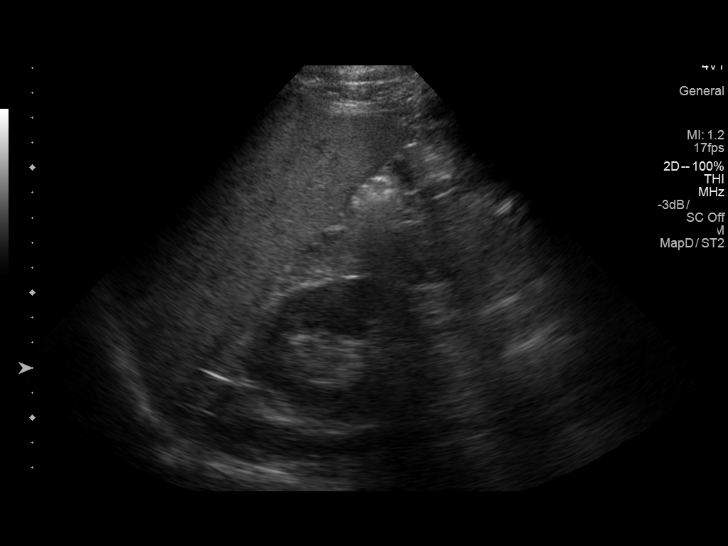
[im 3/33]
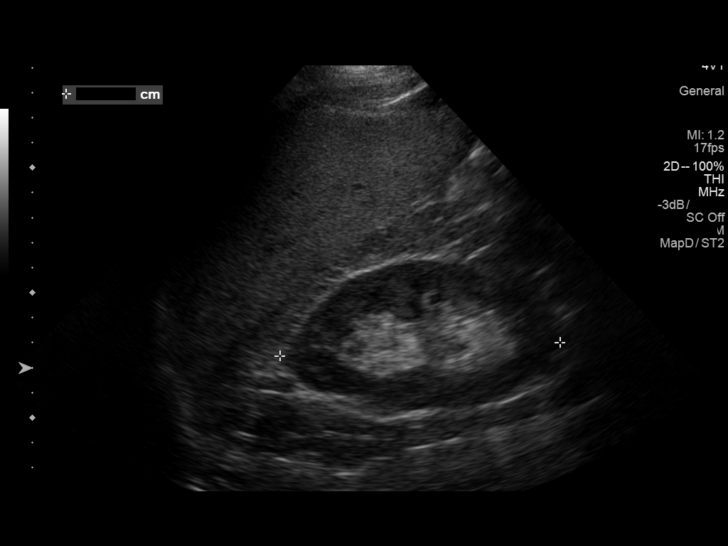
[im 6/33]
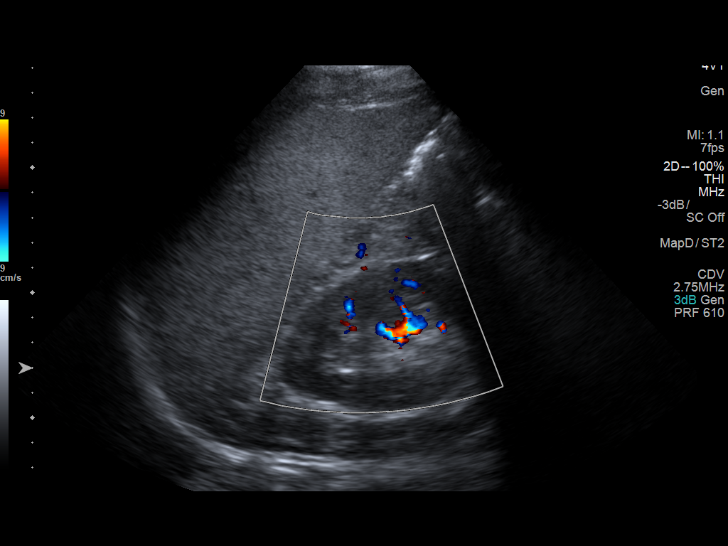
[im 9/33]
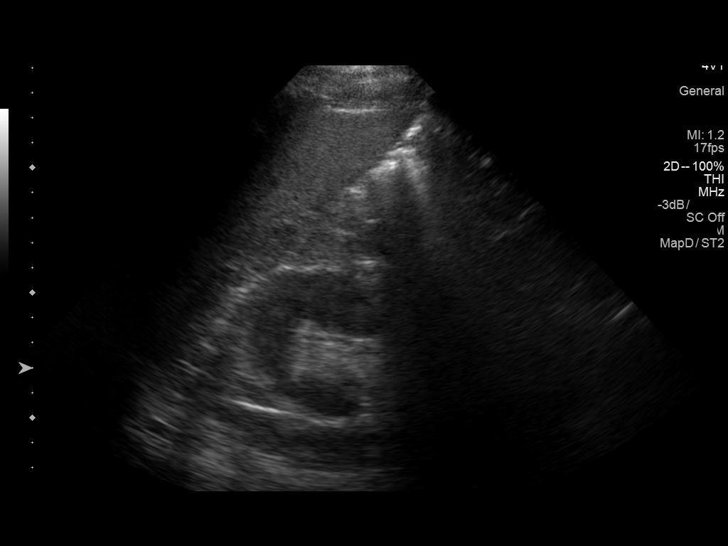
[im 11/33]
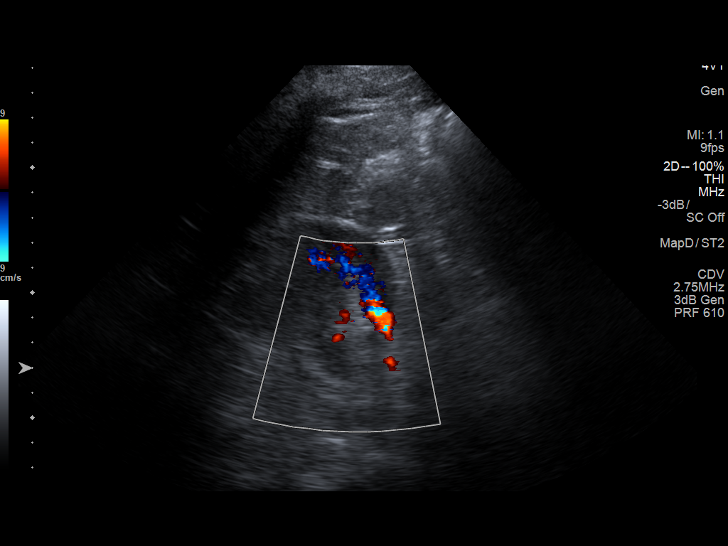
[im 13/33]
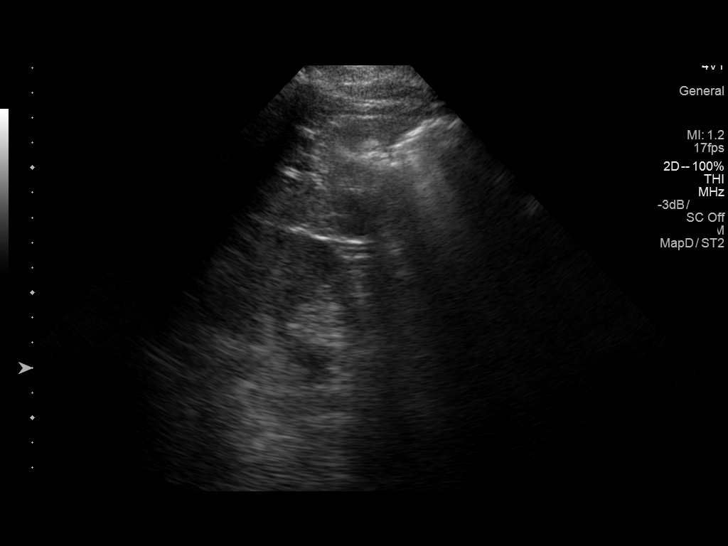
[im 15/33]
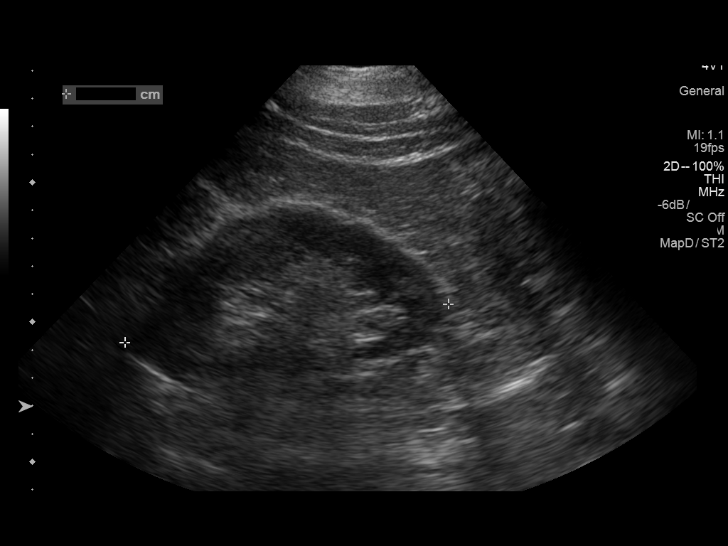
[im 18/33]
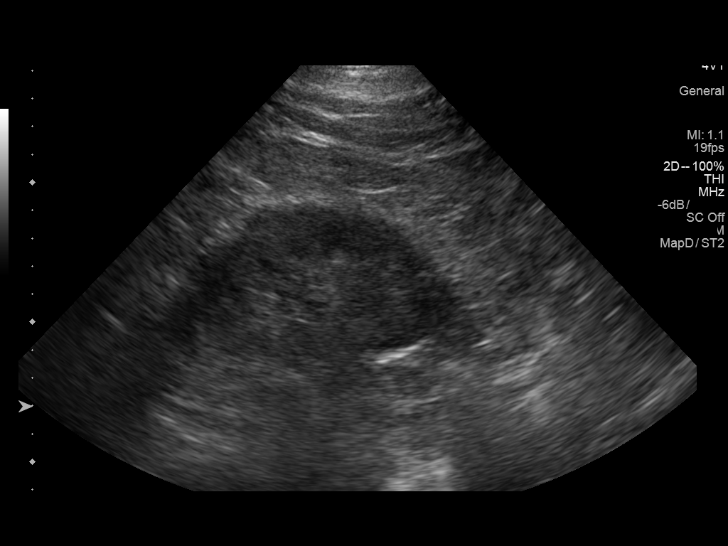
[im 21/33]
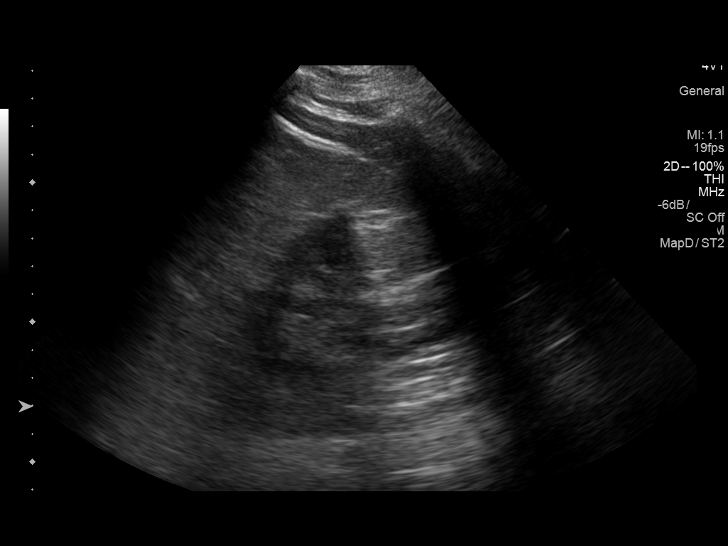
[im 22/33]
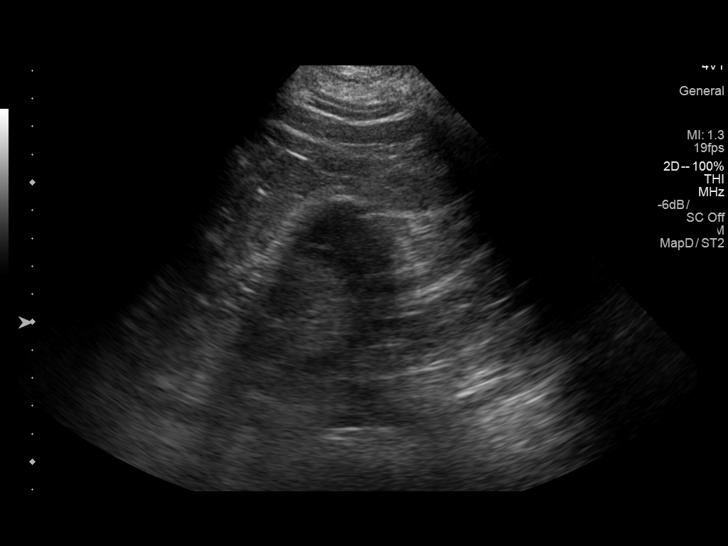
[im 25/33]
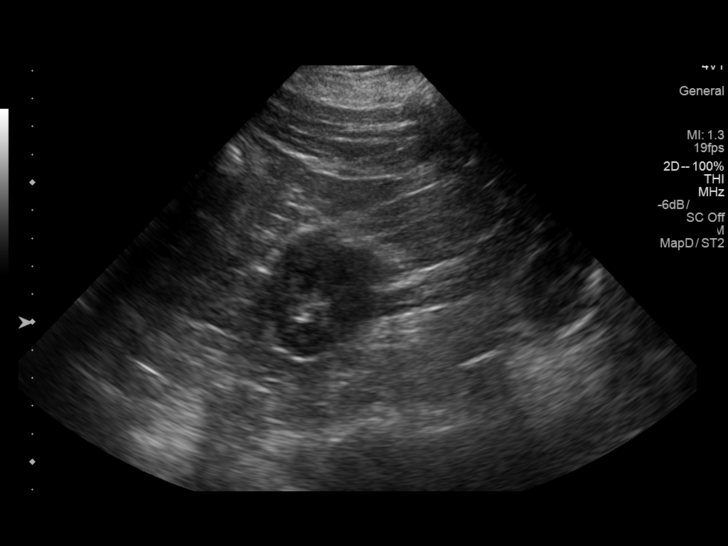
[im 27/33]
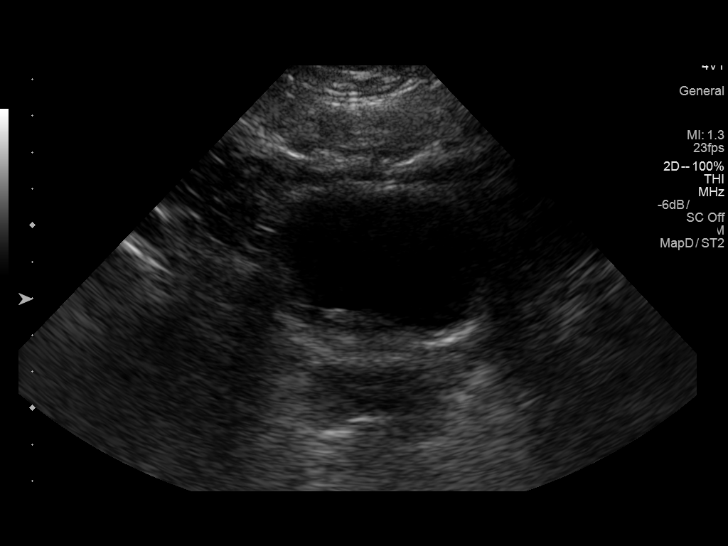
[im 30/33]
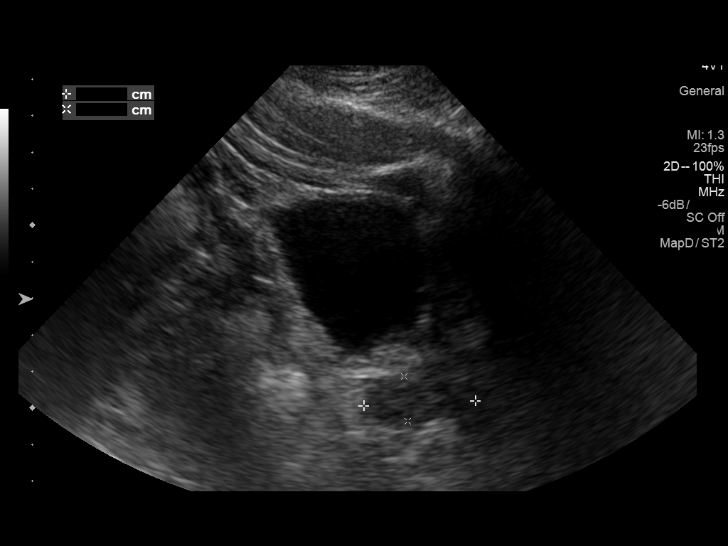
[im 33/33]
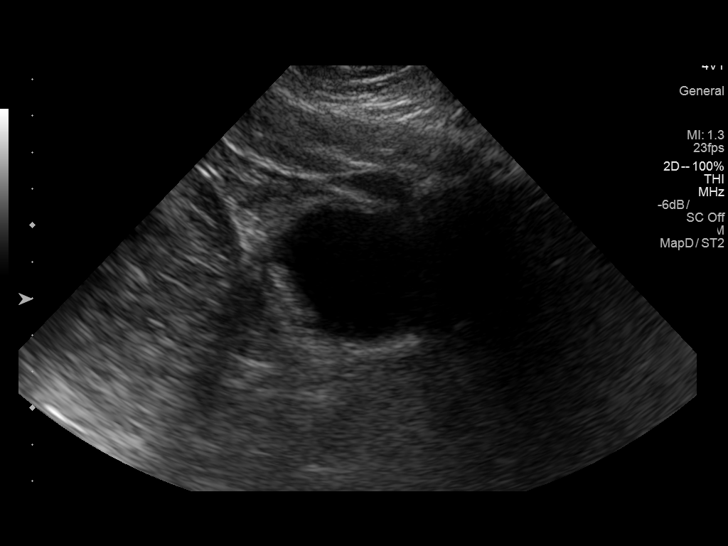

[14 of 25 positions shown; findings below may reference images not displayed]

FINDINGS: Right Kidney:

Length: 11.2 cm. Echogenicity within normal limits. No mass or
hydronephrosis visualized.

Left Kidney:

Length: 11.7 cm. Echogenicity within normal limits. No mass or
hydronephrosis visualized.

Bladder:

Appears normal for degree of bladder distention.
IMPRESSION: 1. No sonographic abnormality of the kidneys urinary bladder is
currently identified. The 3 mm distal ureteral calculus shown on the
CT scan of 02/09/2016 is not well seen, although given its previous
position, it might be difficult to visualize sonographically.
Accordingly this stone may have passed or may still be in the distal
ureter.

## 2017-06-29 ENCOUNTER — Other Ambulatory Visit: Payer: 59

## 2017-06-29 DIAGNOSIS — I10 Essential (primary) hypertension: Secondary | ICD-10-CM

## 2017-06-29 LAB — BASIC METABOLIC PANEL WITH GFR
BUN: 9 mg/dL (ref 7–25)
CO2: 29 mmol/L (ref 20–31)
Calcium: 9 mg/dL (ref 8.6–10.3)
Chloride: 104 mmol/L (ref 98–110)
Creat: 1.15 mg/dL (ref 0.60–1.35)
GFR, Est African American: 87 mL/min (ref >=60)
GFR, Est Non African American: 75 mL/min (ref >=60)
Glucose, Bld: 105 mg/dL — ABNORMAL HIGH (ref 65–99)
Potassium: 4 mmol/L (ref 3.5–5.3)
Sodium: 139 mmol/L (ref 135–146)

## 2017-07-16 ENCOUNTER — Other Ambulatory Visit: Payer: Self-pay | Admitting: Internal Medicine

## 2017-07-16 NOTE — Telephone Encounter (Signed)
Please call Alpraz  

## 2017-08-27 ENCOUNTER — Ambulatory Visit (INDEPENDENT_AMBULATORY_CARE_PROVIDER_SITE_OTHER): Payer: 59 | Admitting: Internal Medicine

## 2017-08-27 VITALS — BP 122/80 | HR 88 | Temp 97.0°F | Resp 18 | Ht 70.0 in | Wt 264.0 lb

## 2017-08-27 DIAGNOSIS — Z9989 Dependence on other enabling machines and devices: Secondary | ICD-10-CM | POA: Diagnosis not present

## 2017-08-27 DIAGNOSIS — Z79899 Other long term (current) drug therapy: Secondary | ICD-10-CM

## 2017-08-27 DIAGNOSIS — R7303 Prediabetes: Secondary | ICD-10-CM | POA: Diagnosis not present

## 2017-08-27 DIAGNOSIS — E559 Vitamin D deficiency, unspecified: Secondary | ICD-10-CM | POA: Diagnosis not present

## 2017-08-27 DIAGNOSIS — E782 Mixed hyperlipidemia: Secondary | ICD-10-CM | POA: Diagnosis not present

## 2017-08-27 DIAGNOSIS — G4733 Obstructive sleep apnea (adult) (pediatric): Secondary | ICD-10-CM | POA: Diagnosis not present

## 2017-08-27 DIAGNOSIS — I10 Essential (primary) hypertension: Secondary | ICD-10-CM

## 2017-08-27 NOTE — Progress Notes (Signed)
This very nice 46 y.o. MWM presents for 6 month follow up with Hypertension, Hyperlipidemia, Pre-Diabetes and Vitamin D Deficiency. Patient is on CPAP  For OSA & reports  improved  sleep hygiene and less daytime somnolensce.      Patient is treated for HTN (2010)  & BP has been controlled at home. Today's BP is at goal - 122/80. Patient has had no complaints of any cardiac type chest pain, palpitations, dyspnea/orthopnea/PND, dizziness, claudication, or dependent edema.     Hyperlipidemia is controlled with diet & meds. Patient denies myalgias or other med SE's. Last Lipids were at goal with sl elev of Trig's: Lab Results  Component Value Date   CHOL 123 05/27/2017   HDL 35 (L) 05/27/2017   LDLCALC 54 05/27/2017   TRIG 171 (H) 05/27/2017   CHOLHDL 3.5 05/27/2017      Also, the patient has Morbid Obesity (BMI 37+) and PreDiabetes (A1c 5.8% in 2016) and has had no symptoms of reactive hypoglycemia, diabetic polys, paresthesias or visual blurring.  Last A1c was at goal: Lab Results  Component Value Date   HGBA1C 5.3 05/27/2017      Further, the patient also has history of Vitamin D Deficiency ("33" / 2008) and supplements vitamin D without any suspected side-effects. Last vitamin D was near goal:   Lab Results  Component Value Date   VD25OH 79 02/18/2017   Current Outpatient Prescriptions on File Prior to Visit  Medication Sig  . ALPRAZolam  1 MG  TAKE 1/2 TO 1 TAB 2-3 x / DAY AS NEEDED   . Atorvastatin 80 MG  TAKE 1 TAB EVERY DAY - Takes 1/2 tab  . diphenhydrAMINE  25 MG  Take  every 6 hrs as needed  . DULoxetine  60 MG  Take 1 caps daily for Mood & Anxiety  . tamsulosin  0.4 MG  TAKE 1 CAP AT BEDTIME  . VITAMIN D 2000 units  Take 6,000 u / daily.    Allergies  Allergen Reactions  . Codeine Other (See Comments)    "can's sleep"  . Effexor [Venlafaxine] Other (See Comments)    ED  . Prednisone Other (See Comments)    "can't sleep"  . Tramadol Hcl Itching  . Zinc Other  (See Comments)    "stomache upset"   PMHx:   Past Medical History:  Diagnosis Date  . Borderline hypertension   . BPH (benign prostatic hypertrophy)   . Hyperlipidemia   . Hypogonadism male   . 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 Obesity (BMI 30-39.9)   . Prediabetes   . Right ureteral stone   . Sleep apnea    C PAP  . Vitamin D deficiency   . Wears glasses    Immunization History  Administered Date(s) Administered  . Influenza-Unspecified 10/20/2014  . PPD Test 01/16/2015, 02/18/2017  . Pneumococcal-Unspecified 10/27/2006  . Tdap 08/05/2013, 05/31/2014   Past Surgical History:  Procedure Laterality Date  . 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 ARTERY REPAIR Right 05/31/2014   Procedure: IRRIGATION, EXPLORATION, AND REPAIR OF RIGHT ARM WOUND;  Surgeon: Cherylynn Ridges, MD;  Location: [facility] OR;  Service: General;  Laterality: Right;  . CYSTOSCOPY WITH RETROGRADE PYELOGRAM, URETEROSCOPY AND STENT PLACEMENT Right 07/11/2016   Procedure: CYSTOSCOPY WITH RETROGRADE PYELOGRAM, URETEROSCOPY, BASKET STONE EXTRACTION  AND STENT PLACEMENT;  Surgeon:  Sebastian Ache, MD;  Location: [facility];  Service: Urology;  Laterality: Right;  45 MINS  C-ARM DIGITAL URETEROSCOPE HOLMIUM LASER  . ESOPHAGOGASTRODUODENOSCOPY     FHx:    Reviewed / unchanged  SHx:    Reviewed / unchanged  Systems Review:  Constitutional: Denies fever, chills, wt changes, headaches, insomnia, fatigue, night sweats, change in appetite. Eyes: Denies redness, blurred vision, diplopia, discharge, itchy, watery eyes.  ENT: Denies discharge, congestion, post nasal drip, epistaxis, sore throat, earache, hearing loss, dental pain, tinnitus, vertigo, sinus pain, snoring.  CV: Denies chest pain, palpitations, irregular heartbeat, syncope, dyspnea, diaphoresis, orthopnea, PND, claudication or  edema. Respiratory: denies cough, dyspnea, DOE, pleurisy, hoarseness, laryngitis, wheezing.  Gastrointestinal: Denies dysphagia, odynophagia, heartburn, reflux, water brash, abdominal pain or cramps, nausea, vomiting, bloating, diarrhea, constipation, hematemesis, melena, hematochezia  or hemorrhoids. Genitourinary: Denies dysuria, frequency, urgency, nocturia, hesitancy, discharge, hematuria or flank pain. Musculoskeletal: Denies arthralgias, myalgias, stiffness, jt. swelling, pain, limping or strain/sprain.  Skin: Denies pruritus, rash, hives, warts, acne, eczema or change in skin lesion(s). Neuro: No weakness, tremor, incoordination, spasms, paresthesia or pain. Psychiatric: Denies confusion, memory loss or sensory loss. Endo: Denies change in weight, skin or hair change.  Heme/Lymph: No excessive bleeding, bruising or enlarged lymph nodes.  Physical Exam  BP 122/80   Pulse 88   Temp (!) 97 F (36.1 C)   Resp 18   Ht 5\' 10"  (1.778 m)   Wt 264 lb (119.7 kg)   BMI 37.88 kg/m   Appears over nourished, well groomed  and in no distress.  Eyes: PERRLA, EOMs, conjunctiva no swelling or erythema. Sinuses: No frontal/maxillary tenderness ENT/Mouth: EAC's clear, TM's nl w/o erythema, bulging. Nares clear w/o erythema, swelling, exudates. Oropharynx clear without erythema or exudates. Oral hygiene is good. Tongue normal, non obstructing. Hearing intact.  Neck: Supple. Thyroid nl. Car 2+/2+ without bruits, nodes or JVD. Chest: Respirations nl with BS clear & equal w/o rales, rhonchi, wheezing or stridor.  Cor: Heart sounds normal w/ regular rate and rhythm without sig. murmurs, gallops, clicks or rubs. Peripheral pulses normal and equal  without edema.  Abdomen: Soft & bowel sounds normal. Non-tender w/o guarding, rebound, hernias, masses or organomegaly.  Lymphatics: Unremarkable.  Musculoskeletal: Full ROM all peripheral extremities, joint stability, 5/5 strength and normal gait.  Skin:  Warm, dry without exposed rashes, lesions or ecchymosis apparent.  Neuro: Cranial nerves intact, reflexes equal bilaterally. Sensory-motor testing grossly intact. Tendon reflexes grossly intact.  Pysch: Alert & oriented x 3.  Insight and judgement nl & appropriate. No ideations.  Assessment and Plan:  1. Essential hypertension  - Continue medication, monitor blood pressure at home.  - Continue DASH diet. Reminder to go to the ER if any CP,  SOB, nausea, dizziness, severe HA, changes vision/speech.  - CBC with Differential/Platelet - BASIC METABOLIC PANEL WITH GFR - Magnesium - TSH  2. Hyperlipidemia, mixed  - Continue diet/meds, exercise,& lifestyle modifications.  - Continue monitor periodic cholesterol/liver & renal functions  - Hepatic function panel - Lipid panel - TSH  3. Prediabetes  - Continue diet, exercise, lifestyle modifications.  - Monitor appropriate labs.  - Hemoglobin A1c - Insulin, fasting  4. Vitamin D deficiency  - Continue supplementation.  - VITAMIN D 25 Hydroxy  5. OSA on CPAP   6. Medication management  - CBC with Differential/Platelet - BASIC METABOLIC PANEL WITH GFR - Hepatic function panel - Magnesium - Lipid panel - TSH - Hemoglobin A1c - Insulin, fasting -  VITAMIN D 25 Hydroxy        Discussed  regular exercise, BP monitoring, weight control to achieve/maintain BMI less than 25 and discussed med and SE's. Recommended labs to assess and monitor clinical status with further disposition pending results of labs. Over 30 minutes of exam, counseling, chart review was performed.

## 2017-08-27 NOTE — Patient Instructions (Signed)

## 2017-08-28 LAB — CBC WITH DIFFERENTIAL/PLATELET
Basophils Absolute: 62 cells/uL (ref 0–200)
Basophils Relative: 0.8 %
Eosinophils Absolute: 239 cells/uL (ref 15–500)
Eosinophils Relative: 3.1 %
HCT: 42.8 % (ref 38.5–50.0)
Hemoglobin: 15.1 g/dL (ref 13.2–17.1)
Lymphs Abs: 1486 cells/uL (ref 850–3900)
MCH: 31.6 pg (ref 27.0–33.0)
MCHC: 35.3 g/dL (ref 32.0–36.0)
MCV: 89.5 fL (ref 80.0–100.0)
Monocytes Relative: 10.1 %
Neutro Abs: 5136 cells/uL (ref 1500–7800)
Neutrophils Relative %: 66.7 %
RBC: 4.78 10*6/uL (ref 4.20–5.80)
RDW: 12.8 % (ref 11.0–15.0)
Total Lymphocyte: 19.3 %
WBC mixed population: 778 cells/uL (ref 200–950)
WBC: 7.7 10*3/uL (ref 3.8–10.8)

## 2017-08-28 LAB — BASIC METABOLIC PANEL WITH GFR
BUN: 12 mg/dL (ref 7–25)
CO2: 27 mmol/L (ref 20–32)
Calcium: 9.6 mg/dL (ref 8.6–10.3)
Chloride: 103 mmol/L (ref 98–110)
Creat: 1.3 mg/dL (ref 0.60–1.35)
GFR, Est African American: 75 mL/min/{1.73_m2} (ref >=60)
GFR, Est Non African American: 65 mL/min/{1.73_m2} (ref >=60)
Glucose, Bld: 149 mg/dL — ABNORMAL HIGH (ref 65–99)
Potassium: 4 mmol/L (ref 3.5–5.3)
Sodium: 139 mmol/L (ref 135–146)

## 2017-08-28 LAB — HEPATIC FUNCTION PANEL
AG Ratio: 2 (calc) (ref 1.0–2.5)
ALT: 35 U/L (ref 9–46)
AST: 21 U/L (ref 10–40)
Albumin: 4.3 g/dL (ref 3.6–5.1)
Alkaline phosphatase (APISO): 75 U/L (ref 40–115)
Bilirubin, Direct: 0.1 mg/dL (ref 0.0–0.2)
Globulin: 2.1 g/dL (calc) (ref 1.9–3.7)
Indirect Bilirubin: 0.6 mg/dL (calc) (ref 0.2–1.2)
Total Bilirubin: 0.7 mg/dL (ref 0.2–1.2)
Total Protein: 6.4 g/dL (ref 6.1–8.1)

## 2017-08-28 LAB — INSULIN, FASTING: Insulin: 297.7 u[IU]/mL — ABNORMAL HIGH (ref 2.0–19.6)

## 2017-08-28 LAB — LIPID PANEL
Cholesterol: 124 mg/dL (ref <200)
HDL: 34 mg/dL — ABNORMAL LOW (ref >40)
LDL Cholesterol (Calc): 62 mg/dL (calc)
Non-HDL Cholesterol (Calc): 90 mg/dL (calc) (ref <130)
Total CHOL/HDL Ratio: 3.6 (calc) (ref <5.0)
Triglycerides: 210 mg/dL — ABNORMAL HIGH (ref <150)

## 2017-08-28 LAB — HEMOGLOBIN A1C
Hgb A1c MFr Bld: 5.4 % of total Hgb (ref <5.7)
Mean Plasma Glucose: 108 (calc)
eAG (mmol/L): 6 (calc)

## 2017-08-28 LAB — MAGNESIUM: Magnesium: 2.1 mg/dL (ref 1.5–2.5)

## 2017-08-28 LAB — VITAMIN D 25 HYDROXY (VIT D DEFICIENCY, FRACTURES): Vit D, 25-Hydroxy: 42 ng/mL (ref 30–100)

## 2017-08-28 LAB — TSH: TSH: 1.58 mIU/L (ref 0.40–4.50)

## 2017-08-29 ENCOUNTER — Encounter: Payer: Self-pay | Admitting: Internal Medicine

## 2017-09-17 ENCOUNTER — Other Ambulatory Visit: Payer: Self-pay | Admitting: Internal Medicine

## 2017-09-17 DIAGNOSIS — N400 Enlarged prostate without lower urinary tract symptoms: Secondary | ICD-10-CM

## 2017-11-12 ENCOUNTER — Other Ambulatory Visit: Payer: Self-pay | Admitting: Internal Medicine

## 2017-11-12 DIAGNOSIS — Z79899 Other long term (current) drug therapy: Secondary | ICD-10-CM

## 2017-12-01 NOTE — Progress Notes (Signed)
FOLLOW UP  Assessment and Plan:   Hypertension  Currently well controlled off of medications Monitor blood pressure at home; patient to call if consistently greater than 130/80 Continue DASH diet.   Reminder to go to the ER if any CP, SOB, nausea, dizziness, severe HA, changes vision/speech, left arm numbness and tingling and jaw pain.  Cholesterol Continue medication  Continue low cholesterol diet and exercise.  Check lipid panel.   Prediabetes/abnormal glucose Discussed disease and risks Discussed diet/exercise, weight management  A1C  Obesity with co morbidities Long discussion about weight loss, diet, and exercise Discussed ideal weight for height and initial weight goal (262 lb) Patient will work on small healthy choices - 80% good - whole wheat wrap instead of chicken biscuit, etc, start swimming again, increase water intake Will follow up in 3 months  Vitamin D Def/ osteoporosis prevention Continue supplementation Check Vit D level  Recurrent major depressive disorder, in partial remission (HCC) Continue medications  Lifestyle discussed: diet/exerise, sleep hygiene, stress management, hydration  Continue diet and meds as discussed. Further disposition pending results of labs. Discussed med's effects and SE's.   Over 30 minutes of exam, counseling, chart review, and critical decision making was performed.   Future Appointments  Date Time Provider Department Center  03/23/2018  2:00 PM Lucky CowboyMcKeown, William, MD [facility] None    ----------------------------------------------------------------------------------------------------------------------  HPI 46 y.o. male  presents for 3 month follow up on hypertension, cholesterol, prediabetes, morbid obesity and vitamin D deficiency.   he has a diagnosis of recurrent major depression with anxious features and is currently on cymbalta with PRN ativan, reports symptoms are well controlled on current regimen.   BMI is Body  mass index is 38.31 kg/m., he has not been working on diet and exercise. Wt Readings from Last 3 Encounters:  12/02/17 267 lb (121.1 kg)  08/27/17 264 lb (119.7 kg)  05/27/17 261 lb 3.2 oz (118.5 kg)  Currently drinks 3-4 bottles  Today their BP is BP: 124/82  He does not workout. He denies chest pain, shortness of breath, dizziness.   He is on cholesterol medication and denies myalgias. His cholesterol is not at goal. The cholesterol last visit was:   Lab Results  Component Value Date   CHOL 124 08/27/2017   HDL 34 (L) 08/27/2017   LDLCALC 54 05/27/2017   TRIG 210 (H) 08/27/2017   CHOLHDL 3.6 08/27/2017    He has not been working on diet and exercise for prediabetes/abnormal glucose, and denies nausea, paresthesia of the feet, polydipsia, polyuria, visual disturbances and vomiting. Last A1C in the office was:  Lab Results  Component Value Date   HGBA1C 5.4 08/27/2017   Patient is on Vitamin D supplement but remained below goal at the last visit:    Lab Results  Component Value Date   VD25OH 42 08/27/2017      Current Medications:  Current Outpatient Medications on File Prior to Visit  Medication Sig  . ALPRAZolam (XANAX) 1 MG tablet TAKE 1/2 TO 1 TABLET 2-3 TIMES PER DAY AS NEEDED FOR ANXIETY  . atorvastatin (LIPITOR) 80 MG tablet TAKE 1 TABLET BY MOUTH EVERY DAY  . Cholecalciferol (VITAMIN D3) 2000 units TABS Take 6,000 tablets by mouth daily.   . diphenhydrAMINE (BENADRYL) 25 MG tablet Take 25 mg by mouth every 6 (six) hours as needed for allergies.  . DULoxetine (CYMBALTA) 60 MG capsule TAKE 1 CAPSULE DAILY FOR MOOD & ANXIETY  . tamsulosin (FLOMAX) 0.4 MG CAPS capsule TAKE 1 CAPSULE  AT BEDTIME FOR PROSTATE   No current facility-administered medications on file prior to visit.      Allergies:  Allergies  Allergen Reactions  . Codeine Other (See Comments)    "can's sleep"  . Effexor [Venlafaxine] Other (See Comments)    ED  . Prednisone Other (See Comments)     "can't sleep"  . Tramadol Hcl Itching  . Zinc Other (See Comments)    "stomache upset"     Medical History:  Past Medical History:  Diagnosis Date  . Borderline hypertension   . BPH (benign prostatic hypertrophy)   . Hyperlipidemia   . Hypogonadism male   . 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 Obesity (BMI 30-39.9)   . Prediabetes   . Right ureteral stone   . Sleep apnea    C PAP  . Vitamin D deficiency   . Wears glasses    Family history- Reviewed and unchanged Social history- Reviewed and unchanged   Review of Systems:  Review of Systems  Constitutional: Negative for malaise/fatigue and weight loss.  HENT: Negative for hearing loss and tinnitus.   Eyes: Negative for blurred vision and double vision.  Respiratory: Negative for cough, shortness of breath and wheezing.   Cardiovascular: Negative for chest pain, palpitations, orthopnea, claudication and leg swelling.  Gastrointestinal: Negative for abdominal pain, blood in stool, constipation, diarrhea, heartburn, melena, nausea and vomiting.  Genitourinary: Negative.   Musculoskeletal: Negative for joint pain and myalgias.  Skin: Negative for rash.  Neurological: Positive for dizziness (Occasionally when stands too quickly). Negative for tingling, sensory change, weakness and headaches.  Endo/Heme/Allergies: Negative for polydipsia.  Psychiatric/Behavioral: Negative for depression.  All other systems reviewed and are negative.   Physical Exam: BP 124/82   Pulse 89   Temp 97.7 F (36.5 C)   Ht 5\' 10"  (1.778 m)   Wt 267 lb (121.1 kg)   SpO2 94%   BMI 38.31 kg/m  Wt Readings from Last 3 Encounters:  12/02/17 267 lb (121.1 kg)  08/27/17 264 lb (119.7 kg)  05/27/17 261 lb 3.2 oz (118.5 kg)   General Appearance: Well nourished, in no apparent distress. Eyes: PERRLA, EOMs, conjunctiva no swelling or erythema Sinuses: No Frontal/maxillary tenderness ENT/Mouth: Ext aud canals clear, TMs  without erythema, bulging. No erythema, swelling, or exudate on post pharynx.  Tonsils not swollen or erythematous. Hearing normal.  Neck: Supple, thyroid normal.  Respiratory: Respiratory effort normal, BS equal bilaterally without rales, rhonchi, wheezing or stridor.  Cardio: RRR with no MRGs. Brisk peripheral pulses without edema.  Abdomen: Soft, + BS.  Non tender, no guarding, rebound, hernias, masses. Lymphatics: Non tender without lymphadenopathy.  Musculoskeletal: Full ROM, 5/5 strength, Normal gait Skin: Warm, dry without rashes, lesions, ecchymosis.  Neuro: Cranial nerves intact. No cerebellar symptoms.  Psych: Awake and oriented X 3, normal affect, Insight and Judgment appropriate.   Dan MakerAshley C Lucill Mauck, NP 3:38 PM [facility]

## 2017-12-02 ENCOUNTER — Ambulatory Visit: Payer: 59 | Admitting: Adult Health

## 2017-12-02 ENCOUNTER — Encounter: Payer: Self-pay | Admitting: Adult Health

## 2017-12-02 VITALS — BP 124/82 | HR 89 | Temp 97.7°F | Ht 70.0 in | Wt 267.0 lb

## 2017-12-02 DIAGNOSIS — R7309 Other abnormal glucose: Secondary | ICD-10-CM | POA: Diagnosis not present

## 2017-12-02 DIAGNOSIS — F3341 Major depressive disorder, recurrent, in partial remission: Secondary | ICD-10-CM

## 2017-12-02 DIAGNOSIS — I10 Essential (primary) hypertension: Secondary | ICD-10-CM

## 2017-12-02 DIAGNOSIS — E559 Vitamin D deficiency, unspecified: Secondary | ICD-10-CM | POA: Diagnosis not present

## 2017-12-02 DIAGNOSIS — Z79899 Other long term (current) drug therapy: Secondary | ICD-10-CM

## 2017-12-02 DIAGNOSIS — E782 Mixed hyperlipidemia: Secondary | ICD-10-CM | POA: Diagnosis not present

## 2017-12-02 NOTE — Patient Instructions (Signed)
Recommend you make small sustainable changes - switch morning biscuit for eggwhite wrap, switch fries for side salad, etc. - aim to work up to making good choices 80% of the time  Look into nearby pool options  Increase water to 80-100 fluid ounces daily of water or unsweetened beverages daily - front load - aka drink more early in the day so you are well hydrated at work.   We want weight loss that will last so you should lose 1-2 pounds a week.  THAT IS IT! Please pick THREE things a month to change. Once it is a habit check off the item. Then pick another three items off the list to become habits.  If you are already doing a habit on the list GREAT!  Cross that item off! o Don't drink your calories. Ie, alcohol, soda, fruit juice, and sweet tea.  o Drink more water. Drink a glass when you feel hungry or before each meal.  o Eat breakfast - Complex carb and protein (likeDannon light and fit yogurt, oatmeal, fruit, eggs, Mal[place]urkey bacon). o Measure your cereal.  Eat no more than one cup a day. (ie [place]ashi) o Eat an apple a day. o Add a vegetable a day. o Try a new vegetable a month. o Use Pam! Stop using oil or butter to cook. o Don't finish your plate or use smaller plates. o Share your dessert. o Eat sugar free Jello for dessert or frozen grapes. o Don't eat 2-3 hours before bed. o Switch to whole wheat bread, pasta, and brown rice. o Make healthier choices when you eat out. No fries! o Pick baked chicken, NOT fried. o Don't forget to SLOW DOWN when you eat. It is not going anywhere.  o Take the stairs. o Park far away in the parking lot o [employer] Farm[employer] soup cans (or weights) for 10 minutes while watching TV. o Walk at work for 10 minutes during break. o Walk outside 1 time a week with your friend, kids, dog, or significant other. o Start a walking group at church. o Walk the mall as much as you can tolerate.  o Keep a food diary. o Weigh yourself daily. o Walk for 15 minutes 3 days per  week. o Cook at home more often and eat out less.  If life happens and you go back to old habits, it is okay.  Just start over. You can do it!   If you experience chest pain, get short of breath, or tired during the exercise, please stop immediately and inform your doctor.

## 2017-12-03 LAB — HEPATIC FUNCTION PANEL
AG Ratio: 2.2 (calc) (ref 1.0–2.5)
ALT: 43 U/L (ref 9–46)
AST: 27 U/L (ref 10–40)
Albumin: 4.4 g/dL (ref 3.6–5.1)
Alkaline phosphatase (APISO): 74 U/L (ref 40–115)
Bilirubin, Direct: 0.1 mg/dL (ref 0.0–0.2)
Globulin: 2 g/dL (calc) (ref 1.9–3.7)
Indirect Bilirubin: 0.6 mg/dL (calc) (ref 0.2–1.2)
Total Bilirubin: 0.7 mg/dL (ref 0.2–1.2)
Total Protein: 6.4 g/dL (ref 6.1–8.1)

## 2017-12-03 LAB — CBC WITH DIFFERENTIAL/PLATELET
Basophils Absolute: 70 cells/uL (ref 0–200)
Basophils Relative: 0.9 %
Eosinophils Absolute: 312 cells/uL (ref 15–500)
Eosinophils Relative: 4 %
HCT: 43.9 % (ref 38.5–50.0)
Hemoglobin: 15.4 g/dL (ref 13.2–17.1)
Lymphs Abs: 1825 cells/uL (ref 850–3900)
MCH: 31.5 pg (ref 27.0–33.0)
MCHC: 35.1 g/dL (ref 32.0–36.0)
MCV: 89.8 fL (ref 80.0–100.0)
Monocytes Relative: 11.4 %
Neutro Abs: 4703 cells/uL (ref 1500–7800)
Neutrophils Relative %: 60.3 %
RBC: 4.89 10*6/uL (ref 4.20–5.80)
RDW: 13 % (ref 11.0–15.0)
Total Lymphocyte: 23.4 %
WBC mixed population: 889 cells/uL (ref 200–950)
WBC: 7.8 10*3/uL (ref 3.8–10.8)

## 2017-12-03 LAB — BASIC METABOLIC PANEL WITH GFR
BUN: 13 mg/dL (ref 7–25)
CO2: 28 mmol/L (ref 20–32)
Calcium: 9.5 mg/dL (ref 8.6–10.3)
Chloride: 104 mmol/L (ref 98–110)
Creat: 1.12 mg/dL (ref 0.60–1.35)
GFR, Est African American: 90 mL/min/{1.73_m2} (ref >=60)
GFR, Est Non African American: 78 mL/min/{1.73_m2} (ref >=60)
Glucose, Bld: 81 mg/dL (ref 65–99)
Potassium: 4.2 mmol/L (ref 3.5–5.3)
Sodium: 140 mmol/L (ref 135–146)

## 2017-12-03 LAB — TSH: TSH: 1.8 mIU/L (ref 0.40–4.50)

## 2017-12-03 LAB — LIPID PANEL
Cholesterol: 128 mg/dL (ref <200)
HDL: 34 mg/dL — ABNORMAL LOW (ref >40)
LDL Cholesterol (Calc): 67 mg/dL (calc)
Non-HDL Cholesterol (Calc): 94 mg/dL (calc) (ref <130)
Total CHOL/HDL Ratio: 3.8 (calc) (ref <5.0)
Triglycerides: 202 mg/dL — ABNORMAL HIGH (ref <150)

## 2017-12-03 LAB — HEMOGLOBIN A1C
Hgb A1c MFr Bld: 5.5 % of total Hgb (ref <5.7)
Mean Plasma Glucose: 111 (calc)
eAG (mmol/L): 6.2 (calc)

## 2017-12-08 ENCOUNTER — Other Ambulatory Visit: Payer: Self-pay | Admitting: *Deleted

## 2017-12-08 DIAGNOSIS — N400 Enlarged prostate without lower urinary tract symptoms: Secondary | ICD-10-CM

## 2017-12-08 MED ORDER — TAMSULOSIN HCL 0.4 MG PO CAPS: ORAL_CAPSULE | ORAL | 1 refills | Status: DC

## 2017-12-09 ENCOUNTER — Other Ambulatory Visit: Payer: Self-pay | Admitting: Internal Medicine

## 2017-12-09 DIAGNOSIS — N400 Enlarged prostate without lower urinary tract symptoms: Secondary | ICD-10-CM

## 2017-12-09 MED ORDER — TAMSULOSIN HCL 0.4 MG PO CAPS: ORAL_CAPSULE | ORAL | 1 refills | Status: DC

## 2018-01-15 ENCOUNTER — Other Ambulatory Visit: Payer: Self-pay | Admitting: Internal Medicine

## 2018-01-24 ENCOUNTER — Other Ambulatory Visit: Payer: Self-pay

## 2018-01-24 ENCOUNTER — Encounter (HOSPITAL_COMMUNITY): Payer: Self-pay | Admitting: Emergency Medicine

## 2018-01-24 ENCOUNTER — Emergency Department (HOSPITAL_COMMUNITY)
Admission: EM | Admit: 2018-01-24 | Discharge: 2018-01-24 | Disposition: A | Discharge status: Home / Self Care | Payer: BC Managed Care – PPO | Attending: Emergency Medicine | Admitting: Emergency Medicine

## 2018-01-24 DIAGNOSIS — N400 Enlarged prostate without lower urinary tract symptoms: Secondary | ICD-10-CM | POA: Diagnosis not present

## 2018-01-24 DIAGNOSIS — Z79899 Other long term (current) drug therapy: Secondary | ICD-10-CM | POA: Diagnosis not present

## 2018-01-24 DIAGNOSIS — R202 Paresthesia of skin: Secondary | ICD-10-CM | POA: Insufficient documentation

## 2018-01-24 DIAGNOSIS — R220 Localized swelling, mass and lump, head: Secondary | ICD-10-CM | POA: Diagnosis present

## 2018-01-24 DIAGNOSIS — Z87891 Personal history of nicotine dependence: Secondary | ICD-10-CM | POA: Diagnosis not present

## 2018-01-24 DIAGNOSIS — T7840XA Allergy, unspecified, initial encounter: Secondary | ICD-10-CM | POA: Diagnosis not present

## 2018-01-24 MED ORDER — FAMOTIDINE 20 MG PO TABS
40.0000 mg | ORAL_TABLET | Freq: Once | ORAL | Status: AC
  Administered 2018-01-24: 40 mg via ORAL
  Filled 2018-01-24: qty 2

## 2018-01-24 MED ORDER — PREDNISONE 10 MG PO TABS: 40.0000 mg | ORAL_TABLET | Freq: Every day | ORAL | 0 refills | Status: DC

## 2018-01-24 MED ORDER — FAMOTIDINE 20 MG PO TABS: 20.0000 mg | ORAL_TABLET | Freq: Two times a day (BID) | ORAL | 0 refills | Status: DC

## 2018-01-24 MED ORDER — PREDNISONE 20 MG PO TABS
60.0000 mg | ORAL_TABLET | Freq: Once | ORAL | Status: AC
  Administered 2018-01-24: 60 mg via ORAL
  Filled 2018-01-24: qty 3

## 2018-01-24 MED ORDER — DIPHENHYDRAMINE HCL 25 MG PO CAPS
25.0000 mg | ORAL_CAPSULE | Freq: Once | ORAL | Status: AC
  Administered 2018-01-24: 25 mg via ORAL
  Filled 2018-01-24: qty 1

## 2018-01-24 NOTE — ED Provider Notes (Signed)
[facility]-EMERGENCY DEPT Provider Note   CSN: [id number] Arrival date & time: 01/24/18  2019     History   Chief Complaint Chief Complaint  Patient presents with  . Oral Pain    HPI Shane Saunders is a 47 y.o. male presenting with sudden onset right cheek swelling just prior to arrival.  Patient reports that all of a sudden his left cheek was tingling and swollen.  Has improved since onset.  Denies sudden onset headache, visual disturbances, weakness.  Patient has not taken anything prior to arrival. He was going to take Benadryl but forgot after calling his PCP. No difficulty breathing, chest pain, narrowing in his throat, fever, chills, dental pain, facial pain, or other symptoms.   HPI  Past Medical History:  Diagnosis Date  . Borderline hypertension   . BPH (benign prostatic hypertrophy)   . Hyperlipidemia   . Hypogonadism male   . 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 Obesity (BMI 30-39.9)   . Prediabetes   . Right ureteral stone   . Sleep apnea    C PAP  . Vitamin D deficiency   . Wears glasses     Patient Active Problem List   Diagnosis Date Noted  . Rectal pain 09/11/2016  . Crushing injury of left elbow and forearm 07/13/2015  . 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 Medication management 02/08/2014  . Hypertension   . Hyperlipidemia   . OSA on CPAP   . Abnormal glucose   . Morbid obesity (HCC)   . Testosterone deficiency   . Vitamin D deficiency     Past Surgical History:  Procedure Laterality Date  . 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 ARTERY REPAIR Right 05/31/2014   Procedure: IRRIGATION, EXPLORATION, AND REPAIR OF RIGHT ARM WOUND;  Surgeon: Cherylynn Ridges, MD;  Location: [facility] OR;  Service: General;  Laterality: Right;  . CYSTOSCOPY WITH RETROGRADE PYELOGRAM, URETEROSCOPY AND STENT PLACEMENT Right  07/11/2016   Procedure: CYSTOSCOPY WITH RETROGRADE PYELOGRAM, URETEROSCOPY, BASKET STONE EXTRACTION  AND STENT PLACEMENT;  Surgeon: Sebastian Ache, MD;  Location: [facility];  Service: Urology;  Laterality: Right;  45 MINS  C-ARM DIGITAL URETEROSCOPE HOLMIUM LASER  . ESOPHAGOGASTRODUODENOSCOPY         Home Medications    Prior to Admission medications   Medication Sig Start Date End Date Taking? Authorizing Provider  ALPRAZolam Prudy Feeler) 1 MG tablet TAKE 1/2 TO 1 TABLET 2 TO 3 TIMES PER DAY AS NEEDED FOR ANXIETY 01/15/18   Quentin Mulling, PA-C  atorvastatin (LIPITOR) 80 MG tablet TAKE 1 TABLET BY MOUTH EVERY DAY 09/17/17   [name], MD  Cholecalciferol (VITAMIN D3) 2000 units TABS Take 6,000 tablets by mouth daily.     [provider]  diphenhydrAMINE (BENADRYL) 25 MG tablet Take 25 mg by mouth every 6 (six) hours as needed for allergies.    [provider]  DULoxetine (CYMBALTA) 60 MG capsule TAKE 1 CAPSULE DAILY FOR MOOD & ANXIETY 11/12/17   [name], MD  tamsulosin (FLOMAX) 0.4 MG CAPS capsule TAKE 1 CAPSULE AT BEDTIME FOR PROSTATE 12/09/17   [name], MD    Family History Family History  Problem Relation Age of Onset  . Hypertension Mother   . Migraines Mother   . Thyroid disease Mother   . Hypertension Father   .  Hyperlipidemia Father   . Diabetes Father   . Cancer Sister        thyroid    Social History Social History   Tobacco Use  . Smoking status: Former Smoker    Packs/day: 0.50    Years: 10.00    Pack years: 5.00    Types: Cigarettes    Last attempt to quit: 10/27/2009    Years since quitting: 8.2  . Smokeless tobacco: Current User    Types: Snuff  Substance Use Topics  . Alcohol use: Yes    Comment: occasional  . Drug use: No     Allergies   Codeine; Effexor [venlafaxine]; Prednisone; Tramadol hcl; and Zinc   Review of Systems Review of Systems  Constitutional: Negative for chills,  diaphoresis and fever.  HENT: Positive for facial swelling. Negative for congestion, dental problem, sinus pressure, sore throat, tinnitus, trouble swallowing and voice change.   Eyes: Negative for photophobia, pain, redness and visual disturbance.  Respiratory: Negative for cough, choking, chest tightness, shortness of breath, wheezing and stridor.   Cardiovascular: Negative for chest pain and palpitations.  Gastrointestinal: Negative for nausea and vomiting.  Musculoskeletal: Negative for myalgias, neck pain and neck stiffness.  Skin: Negative for color change, pallor, rash and wound.  Neurological: Positive for numbness. Negative for dizziness, facial asymmetry, speech difficulty, weakness, light-headedness and headaches.       Patient reports a tingling sensation in the swollen cheek     Physical Exam Updated Vital Signs BP (!) 154/99 (BP Location: Left Arm)   Pulse 78   Temp 98.2 F (36.8 C) (Oral)   Resp 18   Ht 5\' 10"  (1.778 m)   Wt 117.9 kg (260 lb)   SpO2 98%   BMI 37.31 kg/m   Physical Exam  Constitutional: He is oriented to person, place, and time. He appears well-developed and well-nourished. No distress.  Afebrile, nontoxic-appearing, sitting comfortably in chair in no acute distress.  HENT:  Head: Normocephalic and atraumatic.  Mouth/Throat: Oropharynx is clear and moist. No oropharyngeal exudate.  No gross oral abscess. Uvula is midline, arches are simmetrical and intact. No peritonsilar swelling or exudate. No trismus. Sublingual mucosa is soft and non-tender. Tolerating oral secretions. No concern for ludwig's angina.  Eyes: Conjunctivae and EOM are normal. Pupils are equal, round, and reactive to light. Right eye exhibits no discharge. Left eye exhibits no discharge.  Neck: Normal range of motion. Neck supple.  Cardiovascular: Normal rate, regular rhythm and normal heart sounds.  No murmur heard. Pulmonary/Chest: Effort normal and breath sounds normal. No  stridor. No respiratory distress. He has no wheezes. He has no rales.  Abdominal: He exhibits no distension.  Musculoskeletal: Normal range of motion. He exhibits no edema.  Lymphadenopathy:    He has no cervical adenopathy.  Neurological: He is alert and oriented to person, place, and time. No cranial nerve deficit. He exhibits normal muscle tone.  Skin: Skin is warm and dry. No rash noted. He is not diaphoretic. No erythema. No pallor.  Psychiatric: He has a normal mood and affect.  Nursing note and vitals reviewed.    ED Treatments / Results  Labs (all labs ordered are listed, but only abnormal results are displayed) Labs Reviewed - No data to display  EKG  EKG Interpretation None       Radiology No results found.  Procedures Procedures (including critical care time)  Medications Ordered in ED Medications  diphenhydrAMINE (BENADRYL) capsule 25 mg (25 mg Oral Given  01/24/18 2149)  famotidine (PEPCID) tablet 40 mg (40 mg Oral Given 01/24/18 2150)  predniSONE (DELTASONE) tablet 60 mg (60 mg Oral Given 01/24/18 2149)     Initial Impression / Assessment and Plan / ED Course  I have reviewed the triage vital signs and the nursing notes.  Pertinent labs & imaging results that were available during my care of the patient were reviewed by me and considered in my medical decision making (see chart for details).     Patient presenting with possible allergic reaction.  He experienced sudden onset of focal swelling in the right cheek which made his mouth feel tingly.  The swelling improved on its own and patient has not taken anything for symptoms.  Denies any pain.  No dental pain.  No chest pain or shortness of breath.  Normal neuro.  Was given prednisone Pepcid and Benadryl while in the emergency department.  He was observed for some time without  Recurrence.  He is otherwise well-appearing nontoxic afebrile. Lungs CTA bilaterally.  Tolerating oral secretions. No evidence of  infection, erythema, oral abscess, pain, or fever.  Patient overall improved while in the emergency department. We will discharge home with prednisone and antihistamines with close follow-up with PCP for allergy skin testing.  Discussed strict return precautions and advised to return to the emergency department if experiencing any new or worsening symptoms. Instructions were understood and patient agreed with discharge plan. Final Clinical Impressions(s) / ED Diagnoses   Final diagnoses:  Right facial swelling  Allergic reaction, initial encounter    ED Discharge Orders    None       Gregary Cromer 01/24/18 2231    Raeford Razor, MD 01/24/18 2251

## 2018-01-24 NOTE — ED Triage Notes (Addendum)
Pt reports having swelling and pain to right inner cheek that started appx 1 hr prior to arrival. No acute distress noted at time of triage

## 2018-01-24 NOTE — Discharge Instructions (Signed)
As discussed, continue taking your prednisone course and Benadryl or Zyrtec and follow-up with your primary care provider for allergy skin testing.  Avoid anything that you consumed just prior to this episode.  Return if symptoms worsen, facial swelling, difficulty swallowing, difficulty breathing, chest pain, shortness of breath or other new concerning symptoms in the meantime.

## 2018-01-25 ENCOUNTER — Ambulatory Visit: Payer: BC Managed Care – PPO | Admitting: Internal Medicine

## 2018-01-25 VITALS — BP 122/84 | HR 84 | Temp 97.5°F | Resp 18 | Ht 70.0 in | Wt 257.6 lb

## 2018-01-25 DIAGNOSIS — L03211 Cellulitis of face: Secondary | ICD-10-CM

## 2018-01-25 MED ORDER — PREDNISONE 20 MG PO TABS: ORAL_TABLET | ORAL | 0 refills | Status: DC

## 2018-01-25 MED ORDER — AZITHROMYCIN 250 MG PO TABS: ORAL_TABLET | ORAL | 1 refills | Status: DC

## 2018-01-25 NOTE — Progress Notes (Signed)
  Subjective:    Patient ID: Shane Saunders, male    DOB: [birth year], 47 y.o.   MRN: [id number]  HPI  Patient is a nice 47 yo MWM who was seen in the ER yesterday with vague dysthesias of his Rt lateral inner upper lip and was given a prednisone taper. Denies recent unique food exposures or contacts. He was also advised to take Claritin daily and Prn Benadryl.   Medication Sig  . ALPRAZolam (XANAX) 1 MG tablet TAKE 1/2 TO 1 TABLET 2 TO 3 TIMES PER DAY AS NEEDED FOR ANXIETY  . atorvastatin (LIPITOR) 80 MG tablet TAKE 1 TABLET BY MOUTH EVERY DAY  . Cholecalciferol (VITAMIN D3) 2000 units TABS Take 6,000 tablets by mouth daily.   . diphenhydrAMINE (BENADRYL) 25 MG tablet Take 25 mg by mouth every 6 (six) hours as needed for allergies.  . DULoxetine (CYMBALTA) 60 MG capsule TAKE 1 CAPSULE DAILY FOR MOOD & ANXIETY  . famotidine (PEPCID) 20 MG tablet Take 1 tablet (20 mg total) by mouth 2 (two) times daily.  . tamsulosin (FLOMAX) 0.4 MG CAPS capsule TAKE 1 CAPSULE AT BEDTIME FOR PROSTATE  . predniSONE (DELTASONE) 10 MG tablet Take 4 tablets (40 mg total) by mouth daily for 4 days.    Allergies  Allergen Reactions  . Codeine Other (See Comments)    "can's sleep"  . Effexor [Venlafaxine] Other (See Comments)    ED  . Prednisone Other (See Comments)    "can't sleep"  . Tramadol Hcl Itching  . Zinc Other (See Comments)    "stomache upset"   Past Medical History:  Diagnosis Date  . Borderline hypertension   . BPH (benign prostatic hypertrophy)   . Hyperlipidemia   . Hypogonadism male   . 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 Obesity (BMI 30-39.9)   . Prediabetes   . Right ureteral stone   . Sleep apnea    C PAP  . Vitamin D deficiency   . Wears glasses    Review of Systems  10 point systems review negative except as above    Objective:   Physical Exam  BP 122/84   Pulse 84   Temp (!) 97.5 F (36.4 C)   Resp 18   Ht 5\' 10"  (1.778 m)   Wt 257 lb 9.6  oz (116.8 kg)   BMI 36.96 kg/m   In No Distress. No stridor.   HEENT -EAC"s/TM's Nl.  N/clear  O/P with sl palpable induration of the R upper lip extending laterally to the cheek. No obvious skin changes.  Neck - supple.  Chest - Clear equal BS. Cor - Nl HS. RRR w/o sig MGR. PP 1(+). No edema. MS- FROM w/o deformities.  Gait Nl. Neuro -  Nl w/o focal abnormalities. Skin - No rashes.      Assessment & Plan:   1. Cellulitis, face - suspect  - azithromycin (ZITHROMAX) 250 MG tablet; Take 2 tablets (500 mg) on  Day 1,  followed by 1 tablet (250 mg) once daily on Days 2 through 5.  Dispense: 6 each; Refill: 1  -Continue Prednisone taper -  predniSONE  20 MG tablet; 1 tab 3 x day for 3 days, then 1 tab 2 x day for 3 days, then 1 tab 1 x day for 5 days  Dispense: 20 tablet; Refill: 0  - discussed meds/SE's and ROV prn

## 2018-03-10 ENCOUNTER — Other Ambulatory Visit: Payer: Self-pay | Admitting: Internal Medicine

## 2018-03-12 ENCOUNTER — Encounter: Payer: Self-pay | Admitting: Physician Assistant

## 2018-03-12 ENCOUNTER — Ambulatory Visit: Payer: BC Managed Care – PPO | Admitting: Physician Assistant

## 2018-03-12 VITALS — BP 118/76 | HR 80 | Temp 97.9°F | Resp 16 | Ht 70.0 in | Wt 272.2 lb

## 2018-03-12 DIAGNOSIS — R079 Chest pain, unspecified: Secondary | ICD-10-CM

## 2018-03-12 DIAGNOSIS — R0602 Shortness of breath: Secondary | ICD-10-CM

## 2018-03-12 DIAGNOSIS — J309 Allergic rhinitis, unspecified: Secondary | ICD-10-CM

## 2018-03-12 MED ORDER — AZELASTINE HCL 0.1 % NA SOLN: 2.0000 | Freq: Two times a day (BID) | NASAL | 2 refills | Status: DC

## 2018-03-12 MED ORDER — PREDNISONE 20 MG PO TABS: ORAL_TABLET | ORAL | 0 refills | Status: DC

## 2018-03-12 NOTE — Progress Notes (Signed)
47 y.o. male presents with 2 days of URI symptoms. He is on Claritin, Afrin x 2 weeks.   Symptoms include Runny nose, congestion/pressure, no fever, chills. No coughing wheezing, SOB.   He states he has been having dizziness for several months, worse with movement. He also states last 1 month had palpitations and chest pain, no radiation, dull ache with exertion, some SOBand dizziness, will last only 30 seconds. He states he will wake up at night and feel like he can not breath and will have palpitations, he has some mild edema. He has sleep apnea, not doing well with wearing daily but it is better since he wears his mask. Has not had sleep study in several years.    Blood pressure 118/76, pulse 80, temperature 97.9 F (36.6 C), resp. rate 16, height 5\' 10"  (1.778 m), weight 272 lb 3.2 oz (123.5 kg), SpO2 96 %.  Problem list has Hypertension; Hyperlipidemia; OSA on CPAP; Abnormal glucose; Morbid obesity (HCC); Testosterone deficiency; Vitamin D deficiency; Medication management; Depression, major, in partial remission (HCC); Crushing injury of left elbow and forearm; and Rectal pain on their problem list.  Medications Current Outpatient Medications on File Prior to Visit  Medication Sig  . ALPRAZolam (XANAX) 1 MG tablet TAKE 1/2 TO 1 TABLET 2 TO 3 TIMES PER DAY AS NEEDED FOR ANXIETY  . atorvastatin (LIPITOR) 80 MG tablet TAKE 1 TABLET BY MOUTH EVERY DAY  . Cholecalciferol (VITAMIN D3) 2000 units TABS Take 6,000 tablets by mouth daily.   . diphenhydrAMINE (BENADRYL) 25 MG tablet Take 25 mg by mouth every 6 (six) hours as needed for allergies.  . DULoxetine (CYMBALTA) 60 MG capsule TAKE 1 CAPSULE DAILY FOR MOOD & ANXIETY  . tamsulosin (FLOMAX) 0.4 MG CAPS capsule TAKE 1 CAPSULE AT BEDTIME FOR PROSTATE   No current facility-administered medications on file prior to visit.     Allergies: Allergies  Allergen Reactions  . Codeine Other (See Comments)    "can's sleep"  . Effexor [Venlafaxine]  Other (See Comments)    ED  . Prednisone Other (See Comments)    "can't sleep"  . Tramadol Hcl Itching  . Zinc Other (See Comments)    "stomache upset"   ROS: See HPI  Physical Exam Physical Exam  Constitutional: He is oriented to person, place, and time. He appears well-developed and well-nourished.  HENT:  Head: Normocephalic and atraumatic.  Right Ear: Hearing and tympanic membrane normal.  Left Ear: Hearing and tympanic membrane normal.  Nose: Right sinus exhibits maxillary sinus tenderness. Left sinus exhibits maxillary sinus tenderness.  Mouth/Throat: Uvula is midline and mucous membranes are normal. Posterior oropharyngeal erythema present. No oropharyngeal exudate, posterior oropharyngeal edema or tonsillar abscesses.  Eyes: Conjunctivae are normal. Pupils are equal, round, and reactive to light.  Neck: Normal range of motion. Neck supple.  Cardiovascular: Normal rate and regular rhythm.  Pulmonary/Chest: Effort normal and breath sounds normal.  Abdominal: Soft. Bowel sounds are normal.  Musculoskeletal: Normal range of motion.  Lymphadenopathy:    He has no cervical adenopathy.  Neurological: He is alert and oriented to person, place, and time.  Skin: Skin is warm and dry. No rash noted.    Assessment and Plan: Allergic rhinitis Since it has only been 2 days, we do not want to send in an antibiotic yet. We want to wait 7-10 days for you body to fight off an infection like a virus. We can treat the inflammation which will treat the symptoms. If you are  not better after 7-10 days we will send in an antibiotic for you.  Stop afrin Get on astelin/nasonex Prednisone short course  If you are not getting better call the office for an office visit. If you are getting worse go to the ER.   chest pain/SOB with exertion/mild edema Get back on CPAP all the time? Need new study With risk factors (former smoker, obesity, OSA,HTN, chol) will send to cardio for evaluation Get  CXR/ EKG Weight loss advised Get labs- check BNP  Go to the ER if any chest pain, shortness of breath, nausea, dizziness, severe HA, changes vision/speech   Future Appointments  Date Time Provider Department Center  03/23/2018  2:00 PM [name], MD [facility] None

## 2018-03-12 NOTE — Patient Instructions (Addendum)
Stop afrin Prednisone x 2-3 days only Get on astelin nasal spray AND nasonex/flonase over the counter Continue claritin If not better Monday/Tuesday or at least improving message me for antibiotic  Rhinitis medicamentosa -   Rhinitis medicamentosa is a type of rhinitis that develops as a result of overuse of over-the-counter decongestant nasal sprays.   If patients use an over the counter nasal spray containing a nasal decongestant (eg, oxymetazoline or phenylephrine), this can provide temporary help but NEEDS TO BE STOPPED AFTER 2-3 DAYS OR IF CAN LEAD TO ADDICTION. Although these sprays can give rapid relief of congestion when used occasionally, the effects lessen if they are used regularly. Over time, many patients become tolerant to their effects. When this occurs, decongestant sprays actually worsen symptoms, causing the nose to swell unless the spray is used. In such instances it may be difficult to discontinue the spray, and to do so, a medical professional may be needed.  Rhinitis medicamentosa is treated by discontinuing the drug that is causing the condition. Steroid nasal sprays can speed the recovery from this condition.  Make sure you are on an allergy pill, see below for more details. Please take the prednisone as directed below, this is NOT an antibiotic so you do NOT have to finish it. You can take it for a few days and stop it if you are doing better.   Please take the prednisone to help decrease inflammation and therefore decrease symptoms. Take it it with food to avoid GI upset. It can cause increased energy but on the other hand it can make it hard to sleep at night so please take it AT NIGHT WITH DINNER, it takes 8-12 hours to start working so it will NOT affect your sleeping if you take it at night with your food!!  If you are diabetic it will increase your sugars so decrease carbs and monitor your sugars closely.     HOW TO TREAT VIRAL COUGH AND COLD SYMPTOMS:  -Symptoms  usually last at least 1 week with the worst symptoms being around day 4.  - colds usually start with a sore throat and end with a cough, and the cough can take 2 weeks to get better.  -No antibiotics are needed for colds, flu, sore throats, cough, bronchitis UNLESS symptoms are longer than 7 days OR if you are getting better then get drastically worse.  -There are a lot of combination medications (Dayquil, Nyquil, Vicks 44, tyelnol cold and sinus, ETC). Please look at the ingredients on the back so that you are treating the correct symptoms and not doubling up on medications/ingredients.    Medicines you can use  Nasal congestion  Little Remedies saline spray (aerosol/mist)- can try this, it is in the kids section - pseudoephedrine (Sudafed)- behind the counter, do not use if you have high blood pressure, medicine that have -D in them.  - phenylephrine (Sudafed PE) -Dextormethorphan + chlorpheniramine (Coridcidin HBP)- okay if you have high blood pressure -Oxymetazoline (Afrin) nasal spray- LIMIT to 3 days -Saline nasal spray -Neti pot (used distilled or bottled water)  Ear pain/congestion  -pseudoephedrine (sudafed) - Nasonex/flonase nasal spray  Fever  -Acetaminophen (Tyelnol) -Ibuprofen (Advil, motrin, aleve)  Sore Throat  -Acetaminophen (Tyelnol) -Ibuprofen (Advil, motrin, aleve) -Drink a lot of water -Gargle with salt water - Rest your voice (don't talk) -Throat sprays -Cough drops  Body Aches  -Acetaminophen (Tyelnol) -Ibuprofen (Advil, motrin, aleve)  Headache  -Acetaminophen (Tyelnol) -Ibuprofen (Advil, motrin, aleve) - Exedrin, Exedrin Migraine  Allergy symptoms (cough, sneeze, runny nose, itchy eyes) -Claritin or loratadine cheapest but likely the weakest  -Zyrtec or certizine at night because it can make you sleepy -The strongest is allegra or fexafinadine  Cheapest at [employer], [employer], [employer]  Cough  -Dextromethorphan (Delsym)- medicine that has DM in  it -Guafenesin (Mucinex/Robitussin) - cough drops - drink lots of water  Chest Congestion  -Guafenesin (Mucinex/Robitussin)  Red Itchy Eyes  - Naphcon-A  Upset Stomach  - Bland diet (nothing spicy, greasy, fried, and high acid foods like tomatoes, oranges, berries) -OKAY- cereal, bread, soup, crackers, rice -Eat smaller more frequent meals -reduce caffeine, no alcohol -Loperamide (Imodium-AD) if diarrhea -Prevacid for heart burn  General health when sick  -Hydration -wash your hands frequently -keep surfaces clean -change pillow cases and sheets often -Get fresh air but do not exercise strenuously -Vitamin D, double up on it - Vitamin C -Zinc     Go to the ER if any chest pain, shortness of breath, nausea, dizziness, severe HA, changes vision/speech

## 2018-03-13 LAB — HEPATIC FUNCTION PANEL
AG Ratio: 2.3 (calc) (ref 1.0–2.5)
ALT: 44 U/L (ref 9–46)
AST: 25 U/L (ref 10–40)
Albumin: 4.4 g/dL (ref 3.6–5.1)
Alkaline phosphatase (APISO): 79 U/L (ref 40–115)
Bilirubin, Direct: 0.1 mg/dL (ref 0.0–0.2)
Globulin: 1.9 g/dL (calc) (ref 1.9–3.7)
Indirect Bilirubin: 0.5 mg/dL (calc) (ref 0.2–1.2)
Total Bilirubin: 0.6 mg/dL (ref 0.2–1.2)
Total Protein: 6.3 g/dL (ref 6.1–8.1)

## 2018-03-13 LAB — BASIC METABOLIC PANEL WITH GFR
BUN: 10 mg/dL (ref 7–25)
CO2: 27 mmol/L (ref 20–32)
Calcium: 9.7 mg/dL (ref 8.6–10.3)
Chloride: 104 mmol/L (ref 98–110)
Creat: 1.12 mg/dL (ref 0.60–1.35)
GFR, Est African American: 90 mL/min/{1.73_m2} (ref >=60)
GFR, Est Non African American: 77 mL/min/{1.73_m2} (ref >=60)
Glucose, Bld: 97 mg/dL (ref 65–99)
Potassium: 4.3 mmol/L (ref 3.5–5.3)
Sodium: 141 mmol/L (ref 135–146)

## 2018-03-13 LAB — CBC WITH DIFFERENTIAL/PLATELET
Basophils Absolute: 77 cells/uL (ref 0–200)
Basophils Relative: 1.1 %
Eosinophils Absolute: 490 cells/uL (ref 15–500)
Eosinophils Relative: 7 %
HCT: 42.7 % (ref 38.5–50.0)
Hemoglobin: 15.3 g/dL (ref 13.2–17.1)
Lymphs Abs: 1680 cells/uL (ref 850–3900)
MCH: 32.1 pg (ref 27.0–33.0)
MCHC: 35.8 g/dL (ref 32.0–36.0)
MCV: 89.7 fL (ref 80.0–100.0)
Monocytes Relative: 15.8 %
Neutro Abs: 3647 cells/uL (ref 1500–7800)
Neutrophils Relative %: 52.1 %
RBC: 4.76 10*6/uL (ref 4.20–5.80)
RDW: 12.8 % (ref 11.0–15.0)
Total Lymphocyte: 24 %
WBC mixed population: 1106 cells/uL — ABNORMAL HIGH (ref 200–950)
WBC: 7 10*3/uL (ref 3.8–10.8)

## 2018-03-13 LAB — BRAIN NATRIURETIC PEPTIDE: Brain Natriuretic Peptide: <4 pg/mL (ref <100)

## 2018-03-13 LAB — TSH: TSH: 1.64 mIU/L (ref 0.40–4.50)

## 2018-03-21 NOTE — Progress Notes (Signed)
[facility]   [name], M.D.     Dyanne Carrel. Steffanie Dunn, P.A.-C Judd Gaudier, DNP [facility]                [address]                [place], [place]. [address] Telephone [phone] Telefax [phone] Annual  Screening/Preventative Visit  & Comprehensive Evaluation & Examination     This very nice 47 y.o. MWM presents for a Screening/Preventative Visit & comprehensive evaluation and management of multiple medical co-morbidities.  Patient has been followed for HTN, HLD, Prediabetes and Vitamin D Deficiency. Patient has hx/o Depression and admits tendencies to depressed mood and has been intolerant to various meds due to sexual SE's and is currently on Cymbalta.  Patient also relate smoking cessation about 3 months ago and admits still has strong cravings for tobacco.                                        HTN predates circa 2010. Patient's BP has been controlled at home.  Today's BP: is at goal - 138/82. Patient denies any cardiac symptoms as chest pain, palpitations, shortness of breath, dizziness or ankle swelling.     Patient's hyperlipidemia is controlled with diet and medications. Patient denies myalgias or other medication SE's. Last lipids were at goal albeit elevated Trig's: Lab Results  Component Value Date   CHOL 128 12/02/2017   HDL 34 (L) 12/02/2017   LDLCALC 67 12/02/2017   TRIG 202 (H) 12/02/2017   CHOLHDL 3.8 12/02/2017      Patient has Morbid Obesity (BMI 37+) and Prediabetes (A1c 5.8%/2016)  since    and patient denies reactive hypoglycemic symptoms, visual blurring, diabetic polys or paresthesias. Last A1c was Normal & at goal: Lab Results  Component Value Date   HGBA1C 5.5 12/02/2017       Finally, patient has history of Vitamin D Deficiency ("33"/2008 and "29"/2016) and last vitamin D was still low:  Lab Results  Component Value Date   VD25OH 42 08/27/2017   Current Outpatient  Medications on File Prior to Visit  Medication Sig  . ALPRAZolam (XANAX) 1 MG tablet TAKE 1/2 TO 1 TABLET 2 TO 3 TIMES PER DAY AS NEEDED FOR ANXIETY  . atorvastatin (LIPITOR) 80 MG tablet TAKE 1 TABLET BY MOUTH EVERY DAY  . azelastine (ASTELIN) 0.1 % nasal spray Place 2 sprays into both nostrils 2 (two) times daily. Use in each nostril as directed  . Cholecalciferol (VITAMIN D3) 2000 units TABS Take 6,000 tablets by mouth daily.   . diphenhydrAMINE (BENADRYL) 25 MG tablet Take 25 mg by mouth every 6 (six) hours as needed for allergies.  . DULoxetine (CYMBALTA) 60 MG capsule TAKE 1 CAPSULE DAILY FOR MOOD & ANXIETY  . tamsulosin (FLOMAX) 0.4 MG CAPS capsule TAKE 1 CAPSULE AT BEDTIME FOR PROSTATE   No current facility-administered medications on file prior to visit.    Allergies  Allergen Reactions  . Codeine Other (See Comments)    "can's sleep"  . Effexor [Venlafaxine] Other (See Comments)    ED  . Prednisone Other (See Comments)    "can't sleep"  . Tramadol Hcl Itching  . Zinc Other (See Comments)    "stomache upset"   Past Medical History:  Diagnosis Date  . Borderline hypertension   .  BPH (benign prostatic hypertrophy)   . Hyperlipidemia   . Hypogonadism male   . 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 Obesity (BMI 30-39.9)   . Prediabetes   . Right ureteral stone   . Sleep apnea    C PAP  . Vitamin D deficiency   . Wears glasses    Health Maintenance  Topic Date Due  . INFLUENZA VACCINE  07/29/2017  . TETANUS/TDAP  05/31/2024  . HIV Screening  Completed   Immunization History  Administered Date(s) Administered  . Influenza-Unspecified 10/20/2014  . PPD Test 01/16/2015, 02/18/2017  . Pneumococcal-Unspecified 10/27/2006  . Tdap 08/05/2013, 05/31/2014   Last Colon - 09.28.2017 - Colon - Negative - Dr Myrtie Neither - recc f/u 10 years  Past Surgical History:  Procedure Laterality Date  . ABDOMINAL EXPLORATION SURGERY  1998   Celiotomy and  Appendectomy /  (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, congenital)  . ARTERY REPAIR Right 05/31/2014   Procedure: IRRIGATION, EXPLORATION, AND REPAIR OF RIGHT ARM WOUND;  Surgeon: Cherylynn Ridges, MD;  Location: [facility];  Service: General;  Laterality: Right;  . CYSTOSCOPY WITH RETROGRADE PYELOGRAM, URETEROSCOPY AND STENT PLACEMENT Right 07/11/2016   Procedure: CYSTOSCOPY WITH RETROGRADE PYELOGRAM, URETEROSCOPY, BASKET STONE EXTRACTION  AND STENT PLACEMENT;  Surgeon: Sebastian Ache, MD;  Location: [facility];  Service: Urology;  Laterality: Right;  45 MINS  C-ARM DIGITAL URETEROSCOPE HOLMIUM LASER  . ESOPHAGOGASTRODUODENOSCOPY     Family History  Problem Relation Age of Onset  . Hypertension Mother   . Migraines Mother   . Thyroid disease Mother   . Hypertension Father   . Hyperlipidemia Father   . Diabetes Father   . Cancer Sister        thyroid   Social History   Socioeconomic History  . Marital status: Married    Spouse name: Not on file  . Number of children: 2  Occupational History  . Not on file  Tobacco Use  . Smoking status: Former Smoker    Packs/day: 0.50    Years: 10.00    Pack years: 5.00    Types: Cigarettes    Last attempt to quit: 10/27/2009    Years since quitting: 8.4  . Smokeless tobacco: Current User    Types: Snuff  Substance and Sexual Activity  . Alcohol use: Yes    Comment: occasional  . Drug use: No  . Sexual activity: Yes    Comment: Vasectomy  Lifestyle  . Physical activity:    Days per week: Not on file    Minutes per session: Not on file    ROS Constitutional: Denies fever, chills, weight loss/gain, headaches, insomnia,  night sweats or change in appetite. Does c/o fatigue. Eyes: Denies redness, blurred vision, diplopia, discharge, itchy or watery eyes.  ENT: Denies discharge, congestion, post nasal drip, epistaxis, sore throat, earache, hearing loss, dental pain, Tinnitus, Vertigo, Sinus pain or snoring.  Cardio: Denies chest  pain, palpitations, irregular heartbeat, syncope, dyspnea, diaphoresis, orthopnea, PND, claudication or edema Respiratory: denies cough, dyspnea, DOE, pleurisy, hoarseness, laryngitis or wheezing.  Gastrointestinal: Denies dysphagia, heartburn, reflux, water brash, pain, cramps, nausea, vomiting, bloating, diarrhea, constipation, hematemesis, melena, hematochezia, jaundice or hemorrhoids Genitourinary: Denies dysuria, frequency, urgency,  discharge, hematuria or flank pain.  Has occas nocturia x 1-2 and mild hesitancy. Musculoskeletal: Denies arthralgia, myalgia, stiffness, Jt. Swelling, pain, limp or strain/sprain. Denies Falls. Skin: Denies puritis, rash, hives, warts, acne, eczema or change in  skin lesion Neuro: No weakness, tremor, incoordination, spasms, paresthesia or pain Psychiatric: Denies confusion, memory loss or sensory loss. Denies Depression. Endocrine: Denies change in weight, skin, hair change, nocturia, and paresthesia, diabetic polys, visual blurring or hyper / hypo glycemic episodes.  Heme/Lymph: No excessive bleeding, bruising or enlarged lymph nodes.  Physical Exam  BP 138/82   Pulse 92   Temp (!) 97.4 F (36.3 C)   Resp 18   Ht 5\' 10"  (1.778 m)   Wt 274 lb (124.3 kg)   BMI 39.31 kg/m   General Appearance: Well nourished and well groomed and in no apparent distress.  Eyes: PERRLA, EOMs, conjunctiva no swelling or erythema, normal fundi and vessels. Sinuses: No frontal/maxillary tenderness ENT/Mouth: EACs patent / TMs  nl. Nares clear without erythema, swelling, mucoid exudates. Oral hygiene is good. No erythema, swelling, or exudate. Tongue normal, non-obstructing. Tonsils not swollen or erythematous. Hearing normal.  Neck: Supple, thyroid not palpable. No bruits, nodes or JVD. Respiratory: Respiratory effort normal.  BS equal and clear bilateral without rales, rhonci, wheezing or stridor. Cardio: Heart sounds are normal with regular rate and rhythm and no murmurs,  rubs or gallops. Peripheral pulses are normal and equal bilaterally without edema. No aortic or femoral bruits. Chest: symmetric with normal excursions and percussion.  Abdomen: Soft, with Nl bowel sounds. Nontender, no guarding, rebound, hernias, masses, or organomegaly.  Lymphatics: Non tender without lymphadenopathy.  Genitourinary:  DRE - Patient declined . Musculoskeletal: Full ROM all peripheral extremities, joint stability, 5/5 strength, and normal gait. Skin: Warm and dry without rashes, lesions, cyanosis, clubbing or  ecchymosis.  Neuro: Cranial nerves intact, reflexes equal bilaterally. Normal muscle tone, no cerebellar symptoms. Sensation intact.  Pysch: Alert and oriented X 3 with normal affect, insight and judgment appropriate.   Assessment and Plan  1. Annual Preventative/Screening Exam   2. Essential hypertension  - EKG 12-Lead - Urinalysis, Routine w reflex microscopic - Microalbumin / creatinine urine ratio - CBC with Differential/Platelet - BASIC METABOLIC PANEL WITH GFR - Magnesium - TSH  3. Hyperlipidemia, mixed  - EKG 12-Lead - Hepatic function panel - Lipid panel - TSH  4. Prediabetes  - Hemoglobin A1c - Insulin, random  5. Vitamin D deficiency  - VITAMIN D 25 Hydroxy  6. Testosterone deficiency   7. OSA on CPAP   8. Screening for colorectal cancer  - POC Hemoccult Bld/Stl  9. Prostate cancer screening  - Vitamin B12 - PSA  10. Screening for ischemic heart disease  - EKG 12-Lead  11. Former smoker  - EKG 12-Lead  12. Family history of hypertension  - EKG 12-Lead  13. Fatigue, unspecified type  - Iron,Total/Total Iron Binding Cap - Vitamin B12 - Testosterone - CBC with Differential/Platelet - TSH  14. Medication management  - Urinalysis, Routine w reflex microscopic - Microalbumin / creatinine urine ratio - BASIC METABOLIC PANEL WITH GFR - Hepatic function panel - Magnesium - Lipid panel - TSH - Hemoglobin  A1c - Insulin, random - VITAMIN D 25 Hydroxyl         Patient was counseled in prudent diet, weight control to achieve/maintain BMI less than 25, BP monitoring, regular exercise and medications as discussed.  Discussed med effects and SE's. Routine screening labs and tests as requested with regular follow-up as recommended. Over 40 minutes of exam, counseling, chart review and high complex critical decision making was performed

## 2018-03-21 NOTE — Patient Instructions (Signed)

## 2018-03-23 ENCOUNTER — Ambulatory Visit: Payer: BC Managed Care – PPO | Admitting: Internal Medicine

## 2018-03-23 ENCOUNTER — Encounter: Payer: Self-pay | Admitting: Internal Medicine

## 2018-03-23 VITALS — BP 138/82 | HR 92 | Temp 97.4°F | Resp 18 | Ht 70.0 in | Wt 274.0 lb

## 2018-03-23 DIAGNOSIS — Z125 Encounter for screening for malignant neoplasm of prostate: Secondary | ICD-10-CM

## 2018-03-23 DIAGNOSIS — Z130 Encounter for screening for diseases of the blood and blood-forming organs and certain disorders involving the immune mechanism: Secondary | ICD-10-CM

## 2018-03-23 DIAGNOSIS — Z0001 Encounter for general adult medical examination with abnormal findings: Secondary | ICD-10-CM

## 2018-03-23 DIAGNOSIS — E559 Vitamin D deficiency, unspecified: Secondary | ICD-10-CM

## 2018-03-23 DIAGNOSIS — F320 Major depressive disorder, single episode, mild: Secondary | ICD-10-CM

## 2018-03-23 DIAGNOSIS — I10 Essential (primary) hypertension: Secondary | ICD-10-CM

## 2018-03-23 DIAGNOSIS — Z79899 Other long term (current) drug therapy: Secondary | ICD-10-CM

## 2018-03-23 DIAGNOSIS — Z1389 Encounter for screening for other disorder: Secondary | ICD-10-CM | POA: Diagnosis not present

## 2018-03-23 DIAGNOSIS — Z87891 Personal history of nicotine dependence: Secondary | ICD-10-CM

## 2018-03-23 DIAGNOSIS — E291 Testicular hypofunction: Secondary | ICD-10-CM

## 2018-03-23 DIAGNOSIS — Z8249 Family history of ischemic heart disease and other diseases of the circulatory system: Secondary | ICD-10-CM

## 2018-03-23 DIAGNOSIS — Z1211 Encounter for screening for malignant neoplasm of colon: Secondary | ICD-10-CM

## 2018-03-23 DIAGNOSIS — Z136 Encounter for screening for cardiovascular disorders: Secondary | ICD-10-CM | POA: Diagnosis not present

## 2018-03-23 DIAGNOSIS — Z131 Encounter for screening for diabetes mellitus: Secondary | ICD-10-CM

## 2018-03-23 DIAGNOSIS — F17200 Nicotine dependence, unspecified, uncomplicated: Secondary | ICD-10-CM | POA: Insufficient documentation

## 2018-03-23 DIAGNOSIS — E782 Mixed hyperlipidemia: Secondary | ICD-10-CM

## 2018-03-23 DIAGNOSIS — Z0000 Encounter for general adult medical examination without abnormal findings: Secondary | ICD-10-CM | POA: Diagnosis not present

## 2018-03-23 DIAGNOSIS — Z111 Encounter for screening for respiratory tuberculosis: Secondary | ICD-10-CM

## 2018-03-23 DIAGNOSIS — R5383 Other fatigue: Secondary | ICD-10-CM

## 2018-03-23 DIAGNOSIS — Z13220 Encounter for screening for lipoid disorders: Secondary | ICD-10-CM

## 2018-03-23 DIAGNOSIS — Z1329 Encounter for screening for other suspected endocrine disorder: Secondary | ICD-10-CM | POA: Diagnosis not present

## 2018-03-23 DIAGNOSIS — R7303 Prediabetes: Secondary | ICD-10-CM

## 2018-03-23 DIAGNOSIS — G4733 Obstructive sleep apnea (adult) (pediatric): Secondary | ICD-10-CM

## 2018-03-23 DIAGNOSIS — Z9989 Dependence on other enabling machines and devices: Secondary | ICD-10-CM

## 2018-03-23 DIAGNOSIS — Z1212 Encounter for screening for malignant neoplasm of rectum: Secondary | ICD-10-CM

## 2018-03-23 MED ORDER — BUPROPION HCL ER (XL) 300 MG PO TB24: ORAL_TABLET | ORAL | 3 refills | Status: DC

## 2018-03-24 LAB — CBC WITH DIFFERENTIAL/PLATELET
Basophils Absolute: 72 cells/uL (ref 0–200)
Basophils Relative: 1.3 %
Eosinophils Absolute: 336 cells/uL (ref 15–500)
Eosinophils Relative: 6.1 %
HCT: 42.3 % (ref 38.5–50.0)
Hemoglobin: 15 g/dL (ref 13.2–17.1)
Lymphs Abs: 1419 cells/uL (ref 850–3900)
MCH: 31.8 pg (ref 27.0–33.0)
MCHC: 35.5 g/dL (ref 32.0–36.0)
MCV: 89.8 fL (ref 80.0–100.0)
Monocytes Relative: 9.4 %
Neutro Abs: 3157 cells/uL (ref 1500–7800)
Neutrophils Relative %: 57.4 %
RBC: 4.71 10*6/uL (ref 4.20–5.80)
RDW: 12.3 % (ref 11.0–15.0)
Total Lymphocyte: 25.8 %
WBC mixed population: 517 cells/uL (ref 200–950)
WBC: 5.5 10*3/uL (ref 3.8–10.8)

## 2018-03-24 LAB — BASIC METABOLIC PANEL WITH GFR
BUN/Creatinine Ratio: 8 (calc) (ref 6–22)
BUN: 11 mg/dL (ref 7–25)
CO2: 30 mmol/L (ref 20–32)
Calcium: 9.3 mg/dL (ref 8.6–10.3)
Chloride: 104 mmol/L (ref 98–110)
Creat: 1.42 mg/dL — ABNORMAL HIGH (ref 0.60–1.35)
GFR, Est African American: 67 mL/min/{1.73_m2} (ref >=60)
GFR, Est Non African American: 58 mL/min/{1.73_m2} — ABNORMAL LOW (ref >=60)
Glucose, Bld: 151 mg/dL — ABNORMAL HIGH (ref 65–99)
Potassium: 4.2 mmol/L (ref 3.5–5.3)
Sodium: 141 mmol/L (ref 135–146)

## 2018-03-24 LAB — URINALYSIS, ROUTINE W REFLEX MICROSCOPIC
Bilirubin Urine: NEGATIVE
Glucose, UA: NEGATIVE
Hgb urine dipstick: NEGATIVE
Ketones, ur: NEGATIVE
Leukocytes, UA: NEGATIVE
Nitrite: NEGATIVE
Protein, ur: NEGATIVE
Specific Gravity, Urine: 1.011 (ref 1.001–1.03)
pH: 5.5 (ref 5.0–8.0)

## 2018-03-24 LAB — HEPATIC FUNCTION PANEL
AG Ratio: 2.2 (calc) (ref 1.0–2.5)
ALT: 36 U/L (ref 9–46)
AST: 25 U/L (ref 10–40)
Albumin: 4.2 g/dL (ref 3.6–5.1)
Alkaline phosphatase (APISO): 74 U/L (ref 40–115)
Bilirubin, Direct: 0.1 mg/dL (ref 0.0–0.2)
Globulin: 1.9 g/dL (calc) (ref 1.9–3.7)
Indirect Bilirubin: 0.6 mg/dL (calc) (ref 0.2–1.2)
Total Bilirubin: 0.7 mg/dL (ref 0.2–1.2)
Total Protein: 6.1 g/dL (ref 6.1–8.1)

## 2018-03-24 LAB — IRON, TOTAL/TOTAL IRON BINDING CAP
%SAT: 31 % (calc) (ref 15–60)
Iron: 90 ug/dL (ref 50–180)
TIBC: 294 mcg/dL (calc) (ref 250–425)

## 2018-03-24 LAB — INSULIN, RANDOM: Insulin: 255.1 u[IU]/mL — ABNORMAL HIGH (ref 2.0–19.6)

## 2018-03-24 LAB — LIPID PANEL
Cholesterol: 137 mg/dL (ref <200)
HDL: 35 mg/dL — ABNORMAL LOW (ref >40)
LDL Cholesterol (Calc): 69 mg/dL (calc)
Non-HDL Cholesterol (Calc): 102 mg/dL (calc) (ref <130)
Total CHOL/HDL Ratio: 3.9 (calc) (ref <5.0)
Triglycerides: 238 mg/dL — ABNORMAL HIGH (ref <150)

## 2018-03-24 LAB — PSA: PSA: 0.5 ng/mL (ref <=4.0)

## 2018-03-24 LAB — HEMOGLOBIN A1C
Hgb A1c MFr Bld: 5.5 % of total Hgb (ref <5.7)
Mean Plasma Glucose: 111 (calc)
eAG (mmol/L): 6.2 (calc)

## 2018-03-24 LAB — VITAMIN D 25 HYDROXY (VIT D DEFICIENCY, FRACTURES): Vit D, 25-Hydroxy: 39 ng/mL (ref 30–100)

## 2018-03-24 LAB — MAGNESIUM: Magnesium: 2 mg/dL (ref 1.5–2.5)

## 2018-03-24 LAB — MICROALBUMIN / CREATININE URINE RATIO
Creatinine, Urine: 93 mg/dL (ref 20–320)
Microalb Creat Ratio: 2 mcg/mg creat (ref <30)
Microalb, Ur: 0.2 mg/dL

## 2018-03-24 LAB — VITAMIN B12: Vitamin B-12: 396 pg/mL (ref 200–1100)

## 2018-03-24 LAB — TSH: TSH: 1.75 mIU/L (ref 0.40–4.50)

## 2018-03-24 LAB — TESTOSTERONE: Testosterone: 292 ng/dL (ref 250–827)

## 2018-04-11 NOTE — Progress Notes (Signed)
Cardiology Office Note:    Date:  04/12/2018   ID:  Shane Saunders, DOB August 01, 1971, MRN [id number]  PCP:  [name], MD  Cardiologist:   New- Dr. Elease Hashimoto  Referring MD: [name], MD   Chief Complaint  Patient presents with  . Shortness of Breath  . Dizziness    History of Present Illness:    Shane Saunders is a 47 y.o. male who is being seen today for the evaluation of palpitations, shortness of breath, edema at the request of [name], MD.   The patient has a past medical history significant for hyperlipidemia, vitamin D deficiency, remote smoking, obesity, OSA on CPAP and depression. He has no history of cardiac issues. He did have normal stress test many years ago. He has no significant family cardiac history except for paternal grandfather of which he does no know the specifics. He smoked < 1PPD for 5-6 years, having quit in his 20's. He drinks 1-2 beers per week. He eats fast food every day.   Pt developed dizziness about a year ago, mostly upon rising up from the floor. It became worse and now occurs while just standing or walking. He says that it can come on in an instant and sometimes he feels like he is going to hit the floor. This lasts for a few seconds and it he feels normal afterwards. He feels occasional palpitations- not beating right, irregular, lasting less than a minute not every day. Lately he has been awakened with shortness of breath and heart beating fast, 4-5 times in last 2 months, less than once a week. He is also having exertional chest discomfort, heaviness and shortness of breath with walking up hills that last 2-3 minutes and resolves with rest. He hikes, looking for gems, 4-6 miles. He sleeps on 2 pillows for comfort with the CPAP but he denies trouble breathing with laying flat. No significant edema.   No flowsheet data found.      Past Medical History:  Diagnosis Date  . Abnormal glucose   . Borderline hypertension   . BPH  (benign prostatic hypertrophy)   . Crushing injury of left elbow and forearm 07/13/2015  . 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 Hyperlipidemia   . Hypertension   . Hypogonadism male   . 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 Morbid obesity (HCC)   . Obesity (BMI 30-39.9)   . OSA on CPAP   . Prediabetes   . Right ureteral stone   . Sleep apnea    C PAP  . Vitamin D deficiency   . Wears glasses     Past Surgical History:  Procedure Laterality Date  . 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 ARTERY REPAIR Right 05/31/2014   Procedure: IRRIGATION, EXPLORATION, AND REPAIR OF RIGHT ARM WOUND;  Surgeon: Cherylynn Ridges, MD;  Location: [facility] OR;  Service: General;  Laterality: Right;  . CYSTOSCOPY WITH RETROGRADE PYELOGRAM, URETEROSCOPY AND STENT PLACEMENT Right 07/11/2016   Procedure: CYSTOSCOPY WITH RETROGRADE PYELOGRAM, URETEROSCOPY, BASKET STONE EXTRACTION  AND STENT PLACEMENT;  Surgeon: Sebastian Ache, MD;  Location: [facility];  Service: Urology;  Laterality: Right;  45 MINS  C-ARM DIGITAL URETEROSCOPE HOLMIUM LASER  . ESOPHAGOGASTRODUODENOSCOPY      Current Medications: Current Meds  Medication Sig  . ALPRAZolam (XANAX) 1 MG tablet TAKE 1/2 TO  1 TABLET 2 TO 3 TIMES PER DAY AS NEEDED FOR ANXIETY  . atorvastatin (LIPITOR) 80 MG tablet TAKE 1 TABLET BY MOUTH EVERY DAY  . azelastine (ASTELIN) 0.1 % nasal spray Place 2 sprays into both nostrils 2 (two) times daily. Use in each nostril as directed  . buPROPion (WELLBUTRIN XL) 300 MG 24 hr tablet Take 300 mg by mouth daily.  . Cholecalciferol (VITAMIN D3) 2000 units TABS Take 6,000 tablets by mouth daily.   . diphenhydrAMINE (BENADRYL) 25 MG tablet Take 25 mg by mouth every 6 (six) hours as needed for allergies.  . DULoxetine (CYMBALTA) 60 MG capsule Take 60 mg by mouth daily. (For Mood and Anxiety)  .  tamsulosin (FLOMAX) 0.4 MG CAPS capsule Take 0.4 mg by mouth at bedtime. (For Prostate)     Allergies:   Codeine; Effexor [venlafaxine]; Prednisone; Tramadol hcl; and Zinc   Social History   Socioeconomic History  . Marital status: Married    Spouse name: Not on file  . Number of children: 2  . Years of education: Not on file  . Highest education level: Not on file  Occupational History  . Not on file  Social Needs  . Financial resource strain: Not on file  . Food insecurity:    Worry: Not on file    Inability: Not on file  . Transportation needs:    Medical: Not on file    Non-medical: Not on file  Tobacco Use  . Smoking status: Former Smoker    Packs/day: 0.50    Years: 10.00    Pack years: 5.00    Types: Cigarettes    Last attempt to quit: 10/27/2009    Years since quitting: 8.4  . Smokeless tobacco: Former Neurosurgeon    Types: Snuff  Substance and Sexual Activity  . Alcohol use: Yes    Comment: occasional  . Drug use: No  . Sexual activity: Yes    Comment: Vasectomy  Lifestyle  . Physical activity:    Days per week: Not on file    Minutes per session: Not on file  . Stress: Not on file  Relationships  . Social connections:    Talks on phone: Not on file    Gets together: Not on file    Attends religious service: Not on file    Active member of club or organization: Not on file    Attends meetings of clubs or organizations: Not on file    Relationship status: Not on file  Other Topics Concern  . Not on file  Social History Narrative   ** Merged History Encounter **         Family History: The patient's family history includes CAD in his paternal grandfather; Cancer in his sister; Diabetes in his father; Hyperlipidemia in his father; Hypertension in his father and mother; Migraines in his mother; Thyroid disease in his mother. ROS:   Please see the history of present illness.     All other systems reviewed and are negative.  EKGs/Labs/Other Studies  Reviewed:    The following studies were reviewed today:  None   EKG:  EKG is not ordered today.  The ekg from 03/22/18 reviewed today demonstrates NSR at 83 bpm,   QTC 421  Recent Labs: 03/12/2018: Brain Natriuretic Peptide <4 03/23/2018: ALT 36; BUN 11; Creat 1.42; Hemoglobin 15.0; Magnesium 2.0; Platelets CANCELED; Potassium 4.2; Sodium 141; TSH 1.75   Recent Lipid Panel    Component Value Date/Time   CHOL 137  03/23/2018 1439   TRIG 238 (H) 03/23/2018 1439   HDL 35 (L) 03/23/2018 1439   CHOLHDL 3.9 03/23/2018 1439   VLDL 34 (H) 05/27/2017 1622   LDLCALC 69 03/23/2018 1439    Physical Exam:    VS:  BP 112/70   Pulse 80   Ht 5\' 11"  (1.803 m)   Wt 267 lb 12.8 oz (121.5 kg)   BMI 37.35 kg/m     Wt Readings from Last 3 Encounters:  04/12/18 267 lb 12.8 oz (121.5 kg)  03/23/18 274 lb (124.3 kg)  03/12/18 272 lb 3.2 oz (123.5 kg)     GEN: Well nourished, with central obesity, well developed in no acute distress HEENT: Normal NECK: No JVD; No carotid bruits LYMPHATICS: No lymphadenopathy CARDIAC: RRR, no murmurs, rubs, gallops RESPIRATORY:  Clear to auscultation without rales, wheezing or rhonchi  ABDOMEN: Soft, non-tender, non-distended MUSCULOSKELETAL:  No edema; No deformity  SKIN: Warm and dry NEUROLOGIC:  Alert and oriented x 3 PSYCHIATRIC:  Normal affect   ASSESSMENT:    1. Exertional chest pain   2. DOE (dyspnea on exertion)   3. Mixed hyperlipidemia   4. Dizziness   5. Palpitations   6. OSA on CPAP    PLAN:    Pt was seen and examined by myself and Dr Graciela Husbands in clinic today. This patient's case was discussed in depth with Dr. Graciela Husbands. The plan below was formulated per our discussion.  In order of problems listed above:  DOE and vague chest discomfort: With more intense activity, but has also awakened with shortness of breath and "hard heart beat" several times in the last 2 months. CVD risk factors include former remote smoker, obesity, HLD. 10 year risk  of CVD is estimated to be 1.9%. No signs of volume overload on exam. With his concerning symptoms will evaluate coronary arteries with CCTA.  Dizziness: Sudden dizziness and near syncope, progressive over the last year. Currenlty less than once a week, but occ leads to near syncope. Possibility of bradycardic episodes, but EKG is normal. Also has occ irregular heart beat or "skipping". Will assess cardiac event monitor, 1 month.   Hyperlipidemia:    Atorvastatin 80 mg daily. LDL 69. Continue current therapy.   OSA:  On CPAP, using every night.   Medication Adjustments/Labs and Tests Ordered: Current medicines are reviewed at length with the patient today.  Concerns regarding medicines are outlined above. Labs and tests ordered and medication changes are outlined in the patient instructions below:  Patient Instructions  Medication Instructions:  Your physician recommends that you continue on your current medications as directed. Please refer to the Current Medication list given to you today.  Labwork: BMET prior to your CT.  Testing/Procedures: Your provider recommends that you have a Coronary CT with FFR.   Your physician has recommended that you wear an event monitor. Event monitors are medical devices that record the heart's electrical activity. Doctors most often [place] these monitors to diagnose arrhythmias. Arrhythmias are problems with the speed or rhythm of the heartbeat. The monitor is a small, portable device. You can wear one while you do your normal daily activities. This is usually used to diagnose what is causing palpitations/syncope (passing out).   Follow-Up: Your physician recommends that you schedule a follow-up appointment as needed with Dr. Elease Hashimoto.   Any Other Special Instructions Will Be Listed Below (If Applicable).  Please arrive at the [facility] main entrance of [facility] at xx:xx AM (30-45 minutes  prior to test start time)  [facility] [address] [place], [place] [address] [phone]  Proceed to the [facility] Radiology Department (First Floor).  Please follow these instructions carefully (unless otherwise directed):  Hold all erectile dysfunction medications at least 48 hours prior to test.  On the Night Before the Test: . Drink plenty of water. . Do not consume any caffeinated/decaffeinated beverages or chocolate 12 hours prior to your test. . Do not take any antihistamines 12 hours prior to your test. (Do not take your Benadryl)    On the Day of the Test: . Drink plenty of water. Do not drink any water within one hour of the test. . Do not eat any food 4 hours prior to the test. . You may take your regular medications prior to the test. . IF NOT ON A BETA BLOCKER - Take 50 mg of lopressor (metoprolol) one hour before the test. . HOLD Furosemide morning of the test.  After the Test: . Drink plenty of water. . After receiving IV contrast, you may experience a mild flushed feeling. This is normal. . On occasion, you may experience a mild rash up to 24 hours after the test. This is not dangerous. If this occurs, you can take Benadryl 25 mg and increase your fluid intake. . If you experience trouble breathing, this can be serious. If it is severe call 911 IMMEDIATELY. If it is mild, please call our office. . If you take any of these medications: Glipizide/Metformin, Avandament, Glucavance, please do not take 48 hours after completing test.    If you need a refill on your cardiac medications before your next appointment, please call your pharmacy.      Signed, Berton BonJanine Estill Llerena, NP  04/12/2018 1:37 PM    [facility]

## 2018-04-12 ENCOUNTER — Encounter: Payer: Self-pay | Admitting: Cardiology

## 2018-04-12 ENCOUNTER — Ambulatory Visit: Payer: BC Managed Care – PPO | Admitting: Cardiology

## 2018-04-12 VITALS — BP 112/70 | HR 80 | Ht 71.0 in | Wt 267.8 lb

## 2018-04-12 DIAGNOSIS — R42 Dizziness and giddiness: Secondary | ICD-10-CM | POA: Diagnosis not present

## 2018-04-12 DIAGNOSIS — R0609 Other forms of dyspnea: Secondary | ICD-10-CM | POA: Diagnosis not present

## 2018-04-12 DIAGNOSIS — E782 Mixed hyperlipidemia: Secondary | ICD-10-CM | POA: Diagnosis not present

## 2018-04-12 DIAGNOSIS — G4733 Obstructive sleep apnea (adult) (pediatric): Secondary | ICD-10-CM

## 2018-04-12 DIAGNOSIS — R079 Chest pain, unspecified: Secondary | ICD-10-CM

## 2018-04-12 DIAGNOSIS — R002 Palpitations: Secondary | ICD-10-CM | POA: Diagnosis not present

## 2018-04-12 DIAGNOSIS — Z9989 Dependence on other enabling machines and devices: Secondary | ICD-10-CM | POA: Diagnosis not present

## 2018-04-12 DIAGNOSIS — R0600 Dyspnea, unspecified: Secondary | ICD-10-CM

## 2018-04-12 MED ORDER — METOPROLOL TARTRATE 50 MG PO TABS: ORAL_TABLET | ORAL | 0 refills | Status: DC

## 2018-04-12 NOTE — Patient Instructions (Addendum)
Medication Instructions:  Your physician recommends that you continue on your current medications as directed. Please refer to the Current Medication list given to you today.  Labwork: BMET prior to your CT.  Testing/Procedures: Your provider recommends that you have a Coronary CT with FFR.   Your physician has recommended that you wear an event monitor. Event monitors are medical devices that record the heart's electrical activity. Doctors most often us these monitors to diagnose arrhythmias. Arrhythmias are problems with the speed or rhythm of the heartbeat. The monitor is a small, portable device. You can wear one while you do your normal daily activities. This is usually used to diagnose what is causing palpitations/syncope (passing out).   Follow-Up: Your physician recommends that you schedule a follow-up appointment as needed with Dr. Elease HashimotoNahser.   Any Other Special Instructions Will Be Listed Below (If Applicable).  Please [facility] of [facility] at xx:xx AM (30-45 minutes prior to test start time)  [facility]  [address] [place], [place] [address] [phone]  Proceed to the [facility] Radiology Department (First Floor).  Please follow these instructions carefully (unless otherwise directed):  Hold all erectile dysfunction medications at least 48 hours prior to test.  On the Night Before the Test: . Drink plenty of water. . Do not consume any caffeinated/decaffeinated beverages or chocolate 12 hours prior to your test. . Do not take any antihistamines 12 hours prior to your test. (Do not take your Benadryl)    On the Day of the Test: . Drink plenty of water. Do not drink any water within one hour of the test. . Do not eat any food 4 hours prior to the test. . You may take your regular medications prior to the test. . IF NOT ON A BETA BLOCKER - Take 50 mg of lopressor (metoprolol) one hour before the test. . HOLD  Furosemide morning of the test.  After the Test: . Drink plenty of water. . After receiving IV contrast, you may experience a mild flushed feeling. This is normal. . On occasion, you may experience a mild rash up to 24 hours after the test. This is not dangerous. If this occurs, you can take Benadryl 25 mg and increase your fluid intake. . If you experience trouble breathing, this can be serious. If it is severe call 911 IMMEDIATELY. If it is mild, please call our office. . If you take any of these medications: Glipizide/Metformin, Avandament, Glucavance, please do not take 48 hours after completing test.    If you need a refill on your cardiac medications before your next appointment, please call your pharmacy.

## 2018-04-13 ENCOUNTER — Other Ambulatory Visit: Payer: Self-pay | Admitting: Cardiology

## 2018-04-13 ENCOUNTER — Ambulatory Visit (INDEPENDENT_AMBULATORY_CARE_PROVIDER_SITE_OTHER): Payer: BC Managed Care – PPO

## 2018-04-13 DIAGNOSIS — R42 Dizziness and giddiness: Secondary | ICD-10-CM

## 2018-04-13 DIAGNOSIS — R002 Palpitations: Secondary | ICD-10-CM | POA: Diagnosis not present

## 2018-04-23 ENCOUNTER — Other Ambulatory Visit: Payer: Self-pay | Admitting: Physician Assistant

## 2018-05-04 ENCOUNTER — Other Ambulatory Visit: Payer: Self-pay | Admitting: Internal Medicine

## 2018-05-04 DIAGNOSIS — Z79899 Other long term (current) drug therapy: Secondary | ICD-10-CM

## 2018-05-18 ENCOUNTER — Ambulatory Visit: Payer: BC Managed Care – PPO | Admitting: Adult Health

## 2018-05-18 ENCOUNTER — Encounter: Payer: Self-pay | Admitting: Adult Health

## 2018-05-18 VITALS — BP 130/72 | HR 78 | Temp 97.7°F | Resp 16 | Ht 71.0 in | Wt 265.2 lb

## 2018-05-18 DIAGNOSIS — J309 Allergic rhinitis, unspecified: Secondary | ICD-10-CM

## 2018-05-18 DIAGNOSIS — J00 Acute nasopharyngitis [common cold]: Secondary | ICD-10-CM

## 2018-05-18 MED ORDER — DEXAMETHASONE SODIUM PHOSPHATE 10 MG/ML IJ SOLN
10.0000 mg | Freq: Once | INTRAMUSCULAR | Status: AC
Start: 2018-05-18 — End: 2018-05-18
  Administered 2018-05-18: 10 mg via INTRAMUSCULAR

## 2018-05-18 MED ORDER — AZITHROMYCIN 250 MG PO TABS: ORAL_TABLET | ORAL | 1 refills | Status: AC

## 2018-05-18 NOTE — Progress Notes (Signed)
Assessment and Plan:  Shane Saunders was seen today for acute visit, other and sore throat.  Diagnoses and all orders for this visit:  Acute nasopharyngitis - Discussed the importance of avoiding unnecessary antibiotic therapy. Suggested symptomatic OTC remedies. Nasal saline spray for congestion. Nasal steroids, allergy pill, oral steroids Follow up as needed. -     dexamethasone (DECADRON) injection 10 mg  Other orders Fill and take for symptoms lasting 10+ days, new progressively productive cough -     azithromycin (ZITHROMAX) 250 MG tablet; Take 2 tablets (500 mg) on  Day 1,  followed by 1 tablet (250 mg) once daily on Days 2 through 5.  Further disposition pending results of labs. Discussed med's effects and SE's.   Over 15 minutes of exam, counseling, chart review, and critical decision making was performed.   Future Appointments  Date Time Provider Department Center  05/21/2018 11:00 AM [facility]  05/31/2018  8:30 AM [facility] 1 [facility]  05/31/2018  9:00 AM [facility] 1 [facility]  07/06/2018  3:45 PM Judd Gaudier, NP [facility] None  10/12/2018  3:30 PM [name], MD [facility] None  04/07/2019  2:00 PM [name], MD [facility] None    ------------------------------------------------------------------------------------------------------------------   HPI BP 130/72   Pulse 78   Temp 97.7 F (36.5 C)   Resp 16   Ht  (1.803 m)   Wt 265 lb 3.2 oz (120.3 kg)   SpO2 98%   BMI 36.99 kg/m   47 y.o.male , former smoker with dx of OSA with CPAP presents for 3-4 days of URI symptoms; started with mild sore throat, nasal congestion/facial pressure, post nasal drip, non-productive cough (dry/tickle), worse at night, mild headache. He denies fever/chills, N/V/D, vision changes, dizziness.   He does take claritin/zyrtec alternating, astaline nasal spray. He has added mucinex, nyquil in the evenings.   Past Medical History:  Diagnosis Date  .  Abnormal glucose   . Borderline hypertension   . BPH (benign prostatic hypertrophy)   . Crushing injury of left elbow and forearm 07/13/2015  . 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 Hyperlipidemia   . Hypertension   . Hypogonadism male   . 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 Morbid obesity (HCC)   . Obesity (BMI 30-39.9)   . OSA on CPAP   . Prediabetes   . Right ureteral stone   . Sleep apnea    C PAP  . Vitamin D deficiency   . Wears glasses      Allergies  Allergen Reactions  . Codeine Other (See Comments)    "can's sleep"  . Effexor [Venlafaxine] Other (See Comments)    ED  . Prednisone Other (See Comments)    "can't sleep"  . Tramadol Hcl Itching  . Zinc Other (See Comments)    "stomache upset"    Current Outpatient Medications on File Prior to Visit  Medication Sig  . ALPRAZolam (XANAX) 1 MG tablet Take 1/2 to 1 tablet 2 to 3 x / day for Anxiety Attack & please try to limit to 5 days /week to avoid addiction  . atorvastatin (LIPITOR) 80 MG tablet TAKE 1 TABLET BY MOUTH EVERY DAY  . azelastine (ASTELIN) 0.1 % nasal spray Place 2 sprays into both nostrils 2 (two) times daily. Use in each nostril as directed  . buPROPion (WELLBUTRIN XL) 300 MG 24 hr tablet Take 300 mg by mouth daily.  Marland Kitchen  Cholecalciferol (VITAMIN D3) 2000 units TABS Take 6,000 tablets by mouth daily.   . diphenhydrAMINE (BENADRYL) 25 MG tablet Take 25 mg by mouth every 6 (six) hours as needed for allergies.  . DULoxetine (CYMBALTA) 60 MG capsule TAKE 1 CAPSULE DAILY FOR MOOD & ANXIETY  . metoprolol tartrate (LOPRESSOR) 50 MG tablet Take one tablet by mouth one hour prior to your CT.  . tamsulosin (FLOMAX) 0.4 MG CAPS capsule Take 0.4 mg by mouth at bedtime. (For Prostate)   No current facility-administered medications on file prior to visit.     ROS: all negative except above.   Physical Exam:  BP 130/72   Pulse 78   Temp 97.7 F (36.5 C)   Resp  16   Ht  (1.803 m)   Wt 265 lb 3.2 oz (120.3 kg)   SpO2 98%   BMI 36.99 kg/m   General Appearance: Well nourished, in no apparent distress. Eyes: PERRLA, EOMs, conjunctiva no swelling or erythema Sinuses: No Frontal/maxillary tenderness ENT/Mouth: Ext aud canals clear, TMs without erythema, bulging. No erythema, swelling, or exudate on post pharynx.  Tonsils not swollen or erythematous. Hearing normal.  Neck: Supple, thyroid normal.  Respiratory: Respiratory effort normal, BS equal bilaterally without rales, rhonchi, wheezing or stridor.  Cardio: RRR with no MRGs. Brisk peripheral pulses without edema.  Abdomen: Soft, + BS.  Non tender, no guarding, rebound, hernias, masses. Lymphatics: Non tender without lymphadenopathy.  Musculoskeletal: Full ROM, symmetrical strength, normal gait.  Skin: Warm, dry without rashes, lesions, ecchymosis.  Neuro: Cranial nerves intact. Normal muscle tone, no cerebellar symptoms. Sensation intact.  Psych: Awake and oriented X 3, normal affect, Insight and Judgment appropriate.     Dan Maker, NP 3:15 PM [facility]

## 2018-05-18 NOTE — Patient Instructions (Signed)
Hold antibiotic unless symptoms ongoing for 10+ days, or getting better then suddenly gets worse, or develop progressive productive cough, fever/chills  Salt water gargles, humidifier at night Flonase daily to help with ear/nasal pressure Call if need cough medication   HOW TO TREAT VIRAL COUGH AND COLD SYMPTOMS:  -Symptoms usually last at least 1 week with the worst symptoms being around day 4.  - colds usually start with a sore throat and end with a cough, and the cough can take 2 weeks to get better.  -No antibiotics are needed for colds, flu, sore throats, cough, bronchitis UNLESS symptoms are longer than 7 days OR if you are getting better then get drastically worse.  -There are a lot of combination medications (Dayquil, Nyquil, Vicks 44, tyelnol cold and sinus, ETC). Please look at the ingredients on the back so that you are treating the correct symptoms and not doubling up on medications/ingredients.    Medicines you can use  Nasal congestion  Little Remedies saline spray (aerosol/mist)- can try this, it is in the kids section - pseudoephedrine (Sudafed)- behind the counter, do not use if you have high blood pressure, medicine that have -D in them.  - phenylephrine (Sudafed PE) -Dextormethorphan + chlorpheniramine (Coridcidin HBP)- okay if you have high blood pressure -Oxymetazoline (Afrin) nasal spray- LIMIT to 3 days -Saline nasal spray -Neti pot (used distilled or bottled water)  Ear pain/congestion  -pseudoephedrine (sudafed) - Nasonex/flonase nasal spray  Fever  -Acetaminophen (Tyelnol) -Ibuprofen (Advil, motrin, aleve)  Sore Throat  -Acetaminophen (Tyelnol) -Ibuprofen (Advil, motrin, aleve) -Drink a lot of water -Gargle with salt water - Rest your voice (don't talk) -Throat sprays -Cough drops  Body Aches  -Acetaminophen (Tyelnol) -Ibuprofen (Advil, motrin, aleve)  Headache  -Acetaminophen (Tyelnol) -Ibuprofen (Advil, motrin, aleve) - Exedrin, Exedrin  Migraine  Allergy symptoms (cough, sneeze, runny nose, itchy eyes) -Claritin or loratadine cheapest but likely the weakest  -Zyrtec or certizine at night because it can make you sleepy -The strongest is allegra or fexafinadine  Cheapest at [employer], [employer], [employer]  Cough  -Dextromethorphan (Delsym)- medicine that has DM in it -Guafenesin (Mucinex/Robitussin) - cough drops - drink lots of water  Chest Congestion  -Guafenesin (Mucinex/Robitussin)  Red Itchy Eyes  - Naphcon-A  Upset Stomach  - Bland diet (nothing spicy, greasy, fried, and high acid foods like tomatoes, oranges, berries) -OKAY- cereal, bread, soup, crackers, rice -Eat smaller more frequent meals -reduce caffeine, no alcohol -Loperamide (Imodium-AD) if diarrhea -Prevacid for heart burn  General health when sick  -Hydration -wash your hands frequently -keep surfaces clean -change pillow cases and sheets often -Get fresh air but do not exercise strenuously -Vitamin D, double up on it - Vitamin C -Zinc

## 2018-05-21 ENCOUNTER — Other Ambulatory Visit: Payer: BC Managed Care – PPO

## 2018-05-25 ENCOUNTER — Other Ambulatory Visit: Payer: BC Managed Care – PPO | Admitting: *Deleted

## 2018-05-25 DIAGNOSIS — R002 Palpitations: Secondary | ICD-10-CM

## 2018-05-25 DIAGNOSIS — R079 Chest pain, unspecified: Secondary | ICD-10-CM

## 2018-05-25 DIAGNOSIS — R0609 Other forms of dyspnea: Secondary | ICD-10-CM

## 2018-05-25 DIAGNOSIS — R0600 Dyspnea, unspecified: Secondary | ICD-10-CM

## 2018-05-25 LAB — BASIC METABOLIC PANEL
BUN/Creatinine Ratio: 8 — ABNORMAL LOW (ref 9–20)
BUN: 10 mg/dL (ref 6–24)
CO2: 25 mmol/L (ref 20–29)
Calcium: 9.1 mg/dL (ref 8.7–10.2)
Chloride: 102 mmol/L (ref 96–106)
Creatinine, Ser: 1.26 mg/dL (ref 0.76–1.27)
GFR calc Af Amer: 77 mL/min/{1.73_m2} (ref >59)
GFR calc non Af Amer: 67 mL/min/{1.73_m2} (ref >59)
Glucose: 110 mg/dL — ABNORMAL HIGH (ref 65–99)
Potassium: 3.8 mmol/L (ref 3.5–5.2)
Sodium: 140 mmol/L (ref 134–144)

## 2018-05-31 ENCOUNTER — Ambulatory Visit (HOSPITAL_COMMUNITY): Payer: BC Managed Care – PPO

## 2018-05-31 ENCOUNTER — Ambulatory Visit (HOSPITAL_COMMUNITY)
Admission: RE | Admit: 2018-05-31 | Discharge: 2018-05-31 | Disposition: A | Discharge status: Home / Self Care | Payer: BC Managed Care – PPO | Source: Ambulatory Visit | Attending: Cardiology | Admitting: Cardiology

## 2018-05-31 DIAGNOSIS — R079 Chest pain, unspecified: Secondary | ICD-10-CM

## 2018-05-31 DIAGNOSIS — I2510 Atherosclerotic heart disease of native coronary artery without angina pectoris: Secondary | ICD-10-CM | POA: Insufficient documentation

## 2018-05-31 DIAGNOSIS — R0600 Dyspnea, unspecified: Secondary | ICD-10-CM

## 2018-05-31 DIAGNOSIS — R0609 Other forms of dyspnea: Secondary | ICD-10-CM

## 2018-05-31 MED ORDER — NITROGLYCERIN 0.4 MG SL SUBL
0.8000 mg | SUBLINGUAL_TABLET | Freq: Once | SUBLINGUAL | Status: AC
  Administered 2018-05-31: 0.8 mg via SUBLINGUAL

## 2018-05-31 MED ORDER — METOPROLOL TARTRATE 5 MG/5ML IV SOLN
5.0000 mg | Freq: Once | INTRAVENOUS | Status: AC
  Administered 2018-05-31: 5 mg via INTRAVENOUS
  Filled 2018-05-31: qty 5

## 2018-05-31 MED ORDER — IOPAMIDOL (ISOVUE-370) INJECTION 76%
INTRAVENOUS | Status: AC
  Filled 2018-05-31: qty 100

## 2018-05-31 MED ORDER — NITROGLYCERIN 0.4 MG SL SUBL
SUBLINGUAL_TABLET | SUBLINGUAL | Status: AC
  Filled 2018-05-31: qty 2

## 2018-05-31 MED ORDER — IOPAMIDOL (ISOVUE-370) INJECTION 76%
80.0000 mL | Freq: Once | INTRAVENOUS | Status: AC | PRN
  Administered 2018-05-31: 80 mL via INTRAVENOUS

## 2018-05-31 MED ORDER — METOPROLOL TARTRATE 5 MG/5ML IV SOLN
INTRAVENOUS | Status: AC
  Filled 2018-05-31: qty 5

## 2018-06-13 ENCOUNTER — Other Ambulatory Visit: Payer: Self-pay | Admitting: Internal Medicine

## 2018-06-13 DIAGNOSIS — F320 Major depressive disorder, single episode, mild: Secondary | ICD-10-CM

## 2018-07-05 NOTE — Progress Notes (Deleted)
FOLLOW UP  Assessment and Plan:   Hypertension  Currently well controlled off of medications Monitor blood pressure at home; patient to call if consistently greater than 130/80 Continue DASH diet.   Reminder to go to the ER if any CP, SOB, nausea, dizziness, severe HA, changes vision/speech, left arm numbness and tingling and jaw pain.  Cholesterol Continue medication  Continue low cholesterol diet and exercise.  Check lipid panel.   Prediabetes/abnormal glucose Discussed disease and risks Discussed diet/exercise, weight management  A1C  Obesity with co morbidities Long discussion about weight loss, diet, and exercise Discussed ideal weight for height and initial weight goal (262 lb) Patient will work on small healthy choices - 80% good - whole wheat wrap instead of chicken biscuit, etc, start swimming again, increase water intake Will follow up in 3 months  Vitamin D Def/ osteoporosis prevention Continue supplementation Check Vit D level  Recurrent major depressive disorder, in partial remission (HCC) Continue medications, reminded to limit benzo use, aim for <5 days/week Lifestyle discussed: diet/exerise, sleep hygiene, stress management, hydration   Tobacco use Discussed risks associated with tobacco use and advised to reduce or quit Patient is not ready to do so, but advised to consider strongly Will follow up at the next visit    Continue diet and meds as discussed. Further disposition pending results of labs. Discussed med's effects and SE's.   Over 30 minutes of exam, counseling, chart review, and critical decision making was performed.   Future Appointments  Date Time Provider Department Center  07/06/2018  3:45 PM Judd Gaudierorbett, Dajanee Voorheis, NP [facility] None  10/12/2018  3:30 PM Lucky CowboyMcKeown, William, MD [facility] None  04/07/2019  2:00 PM Lucky CowboyMcKeown, William, MD [facility] None     ----------------------------------------------------------------------------------------------------------------------  HPI 47 y.o. male  presents for 3 month follow up on hypertension, cholesterol, glucose management, morbid obesity and vitamin D deficiency.   he currently continues to smoke *** pack a day; discussed risks associated with smoking, patient {ACTION; IS/IS [id number]} ready to quit.   he has a diagnosis of recurrent major depression with anxious features and is currently on cymbalta with PRN ativan, reports symptoms are well controlled on current regimen.   BMI is There is no height or weight on file to calculate BMI., he has not been working on diet and exercise. Wt Readings from Last 3 Encounters:  05/18/18 265 lb 3.2 oz (120.3 kg)  04/12/18 267 lb 12.8 oz (121.5 kg)  03/23/18 274 lb (124.3 kg)  Currently drinks 3-4 bottles  Today their BP is    He does not workout. He denies chest pain, shortness of breath, dizziness.   He is on cholesterol medication and denies myalgias. His LDL cholesterol is at goal, triglycerides remain elevated. The cholesterol last visit was:   Lab Results  Component Value Date   CHOL 137 03/23/2018   HDL 35 (L) 03/23/2018   LDLCALC 69 03/23/2018   TRIG 238 (H) 03/23/2018   CHOLHDL 3.9 03/23/2018    He has not been working on diet and exercise for prediabetes/abnormal glucose, and denies nausea, paresthesia of the feet, polydipsia, polyuria, visual disturbances and vomiting. Last A1C in the office was:  Lab Results  Component Value Date   HGBA1C 5.5 03/23/2018   Patient is on Vitamin D supplement but remained below goal at the last visit:    Lab Results  Component Value Date   VD25OH 39 03/23/2018      Current Medications:  Current Outpatient Medications on File Prior to  Visit  Medication Sig  . ALPRAZolam (XANAX) 1 MG tablet Take 1/2 to 1 tablet 2 to 3 x / day for Anxiety Attack & please try to limit to 5 days /week to avoid  addiction  . atorvastatin (LIPITOR) 80 MG tablet TAKE 1 TABLET BY MOUTH EVERY DAY  . azelastine (ASTELIN) 0.1 % nasal spray Place 2 sprays into both nostrils 2 (two) times daily. Use in each nostril as directed  . buPROPion (WELLBUTRIN XL) 300 MG 24 hr tablet Take 300 mg by mouth daily.  Marland Kitchen buPROPion (WELLBUTRIN XL) 300 MG 24 hr tablet TAKE 1 TABLET BY MOUTH EVERY DAY IN THE MORNING  . Cholecalciferol (VITAMIN D3) 2000 units TABS Take 6,000 tablets by mouth daily.   . diphenhydrAMINE (BENADRYL) 25 MG tablet Take 25 mg by mouth every 6 (six) hours as needed for allergies.  . DULoxetine (CYMBALTA) 60 MG capsule TAKE 1 CAPSULE DAILY FOR MOOD & ANXIETY  . metoprolol tartrate (LOPRESSOR) 50 MG tablet Take one tablet by mouth one hour prior to your CT.  . tamsulosin (FLOMAX) 0.4 MG CAPS capsule Take 0.4 mg by mouth at bedtime. (For Prostate)   No current facility-administered medications on file prior to visit.      Allergies:  Allergies  Allergen Reactions  . Codeine Other (See Comments)    "can's sleep"  . Effexor [Venlafaxine] Other (See Comments)    ED  . Prednisone Other (See Comments)    "can't sleep"  . Tramadol Hcl Itching  . Zinc Other (See Comments)    "stomache upset"     Medical History:  Past Medical History:  Diagnosis Date  . Abnormal glucose   . Borderline hypertension   . BPH (benign prostatic hypertrophy)   . Crushing injury of left elbow and forearm 07/13/2015  . 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 Hyperlipidemia   . Hypertension   . Hypogonadism male   . 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 Morbid obesity (HCC)   . Obesity (BMI 30-39.9)   . OSA on CPAP   . Prediabetes   . Right ureteral stone   . Sleep apnea    C PAP  . Vitamin D deficiency   . Wears glasses    Family history- Reviewed and unchanged Social history- Reviewed and unchanged   Review of Systems:  Review of Systems  Constitutional:  Negative for malaise/fatigue and weight loss.  HENT: Negative for hearing loss and tinnitus.   Eyes: Negative for blurred vision and double vision.  Respiratory: Negative for cough, shortness of breath and wheezing.   Cardiovascular: Negative for chest pain, palpitations, orthopnea, claudication and leg swelling.  Gastrointestinal: Negative for abdominal pain, blood in stool, constipation, diarrhea, heartburn, melena, nausea and vomiting.  Genitourinary: Negative.   Musculoskeletal: Negative for joint pain and myalgias.  Skin: Negative for rash.  Neurological: Positive for dizziness (Occasionally when stands too quickly). Negative for tingling, sensory change, weakness and headaches.  Endo/Heme/Allergies: Negative for polydipsia.  Psychiatric/Behavioral: Negative for depression.  All other systems reviewed and are negative.   Physical Exam: There were no vitals taken for this visit. Wt Readings from Last 3 Encounters:  05/18/18 265 lb 3.2 oz (120.3 kg)  04/12/18 267 lb 12.8 oz (121.5 kg)  03/23/18 274 lb (124.3 kg)   General Appearance: Well nourished, in no apparent distress. Eyes: PERRLA, EOMs, conjunctiva no swelling or erythema Sinuses: No Frontal/maxillary tenderness ENT/Mouth: Ext aud canals  clear, TMs without erythema, bulging. No erythema, swelling, or exudate on post pharynx.  Tonsils not swollen or erythematous. Hearing normal.  Neck: Supple, thyroid normal.  Respiratory: Respiratory effort normal, BS equal bilaterally without rales, rhonchi, wheezing or stridor.  Cardio: RRR with no MRGs. Brisk peripheral pulses without edema.  Abdomen: Soft, + BS.  Non tender, no guarding, rebound, hernias, masses. Lymphatics: Non tender without lymphadenopathy.  Musculoskeletal: Full ROM, 5/5 strength, Normal gait Skin: Warm, dry without rashes, lesions, ecchymosis.  Neuro: Cranial nerves intact. No cerebellar symptoms.  Psych: Awake and oriented X 3, normal affect, Insight and Judgment  appropriate.   Dan Maker, NP 1:13 PM [facility]

## 2018-07-06 ENCOUNTER — Ambulatory Visit: Payer: Self-pay | Admitting: Adult Health

## 2018-08-24 ENCOUNTER — Other Ambulatory Visit: Payer: Self-pay | Admitting: Internal Medicine

## 2018-09-10 ENCOUNTER — Other Ambulatory Visit: Payer: Self-pay | Admitting: Adult Health

## 2018-10-11 ENCOUNTER — Encounter: Payer: Self-pay | Admitting: Internal Medicine

## 2018-10-11 DIAGNOSIS — R7303 Prediabetes: Secondary | ICD-10-CM | POA: Insufficient documentation

## 2018-10-11 DIAGNOSIS — Z87891 Personal history of nicotine dependence: Secondary | ICD-10-CM | POA: Insufficient documentation

## 2018-10-11 DIAGNOSIS — Z8249 Family history of ischemic heart disease and other diseases of the circulatory system: Secondary | ICD-10-CM | POA: Insufficient documentation

## 2018-10-11 NOTE — Progress Notes (Signed)
This very nice 47 y.o. MWM presents for 6 month follow up with HTN, HLD, Pre-Diabetes and Vitamin D Deficiency. Patient has OSA and is on CPAP with improved sleep hygiene.      Patient is treated for HTN (2010)  & BP has been controlled at home. Today's BP is at goal - 126/84. Patient has had no complaints of any cardiac type chest pain, palpitations, dyspnea / orthopnea / PND, dizziness, claudication, or dependent edema.     Hyperlipidemia is controlled with diet & meds. Patient denies myalgias or other med SE's. Last Lipids were at goal with elevated Trig's: Lab Results  Component Value Date   CHOL 137 03/23/2018   HDL 35 (L) 03/23/2018   LDLCALC 69 03/23/2018   TRIG 238 (H) 03/23/2018   CHOLHDL 3.9 03/23/2018      Also, the patient has history of  Morbid Obesity (BMI 36+) and PreDiabetes (A1c 5.8%/2016)  and has had no symptoms of reactive hypoglycemia, diabetic polys, paresthesias or visual blurring.  Last A1c was Normal & at goal: Lab Results  Component Value Date   HGBA1C 5.5 03/23/2018      Further, the patient also has history of Vitamin D Deficiency ("33"/2008 & "29"/2016) and supplements vitamin D sporadically. Last vitamin D was still low: Lab Results  Component Value Date   VD25OH 39 03/23/2018   Current Outpatient Medications on File Prior to Visit  Medication Sig  . ALPRAZolam (XANAX) 1 MG tablet TAKE 1/2-1 TABLET BY MOUTH 2-3 TIMES A DAY AS NEEDED FOR ANXIETY ATTACKS. LIMIT TO 5 DAYS/WEEK  . atorvastatin (LIPITOR) 80 MG tablet TAKE 1 TABLET BY MOUTH EVERY DAY  . buPROPion (WELLBUTRIN XL) 300 MG 24 hr tablet TAKE 1 TABLET BY MOUTH EVERY DAY IN THE MORNING  . Cholecalciferol (VITAMIN D3) 2000 units TABS Take 6,000 tablets by mouth daily.   . diphenhydrAMINE (BENADRYL) 25 MG tablet Take 25 mg by mouth every 6 (six) hours as needed for allergies.  . DULoxetine (CYMBALTA) 60 MG capsule TAKE 1 CAPSULE DAILY FOR MOOD & ANXIETY  . tamsulosin (FLOMAX) 0.4 MG CAPS capsule  Take 0.4 mg by mouth at bedtime. (For Prostate)   No current facility-administered medications on file prior to visit.    Allergies  Allergen Reactions  . Codeine Other (See Comments)    "can's sleep"  . Effexor [Venlafaxine] Other (See Comments)    ED  . Prednisone Other (See Comments)    "can't sleep"  . Tramadol Hcl Itching  . Zinc Other (See Comments)    "stomache upset"   PMHx:   Past Medical History:  Diagnosis Date  . Abnormal glucose   . Borderline hypertension   . BPH (benign prostatic hypertrophy)   . Crushing injury of left elbow and forearm 07/13/2015  . 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 Hyperlipidemia   . Hypertension   . Hypogonadism male   . 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 Morbid obesity (HCC)   . Obesity (BMI 30-39.9)   . OSA on CPAP   . Prediabetes   . Right ureteral stone   . Sleep apnea    C PAP  . Vitamin D deficiency   . Wears glasses    Immunization History  Administered Date(s) Administered  . Influenza-Unspecified 10/20/2014  . PPD Test 01/16/2015, 02/18/2017, 03/23/2018  . Pneumococcal-Unspecified 10/27/2006  . Tdap 08/05/2013, 05/31/2014   Past Surgical History:  Procedure Laterality Date  . 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 ARTERY REPAIR Right 05/31/2014   Procedure: IRRIGATION, EXPLORATION, AND REPAIR OF RIGHT ARM WOUND;  Surgeon: Cherylynn Ridges, MD;  Location: [facility] OR;  Service: General;  Laterality: Right;  . CYSTOSCOPY WITH RETROGRADE PYELOGRAM, URETEROSCOPY AND STENT PLACEMENT Right 07/11/2016   Procedure: CYSTOSCOPY WITH RETROGRADE PYELOGRAM, URETEROSCOPY, BASKET STONE EXTRACTION  AND STENT PLACEMENT;  Surgeon: Sebastian Ache, MD;  Location: [facility];  Service: Urology;  Laterality: Right;  45 MINS  C-ARM DIGITAL URETEROSCOPE HOLMIUM LASER  . ESOPHAGOGASTRODUODENOSCOPY     FHx:     Reviewed / unchanged  SHx:    Reviewed / unchanged   Systems Review:  Constitutional: Denies fever, chills, wt changes, headaches, insomnia, fatigue, night sweats, change in appetite. Eyes: Denies redness, blurred vision, diplopia, discharge, itchy, watery eyes.  ENT: Denies discharge, congestion, post nasal drip, epistaxis, sore throat, earache, hearing loss, dental pain, tinnitus, vertigo, sinus pain, snoring.  CV: Denies chest pain, palpitations, irregular heartbeat, syncope, dyspnea, diaphoresis, orthopnea, PND, claudication or edema. Respiratory: denies cough, dyspnea, DOE, pleurisy, hoarseness, laryngitis, wheezing.  Gastrointestinal: Denies dysphagia, odynophagia, heartburn, reflux, water brash, abdominal pain or cramps, nausea, vomiting, bloating, diarrhea, constipation, hematemesis, melena, hematochezia  or hemorrhoids. Genitourinary: Denies dysuria, frequency, urgency, nocturia, hesitancy, discharge, hematuria or flank pain. Musculoskeletal: Denies arthralgias, myalgias, stiffness, jt. swelling, pain, limping or strain/sprain.  Skin: Denies pruritus, rash, hives, warts, acne, eczema or change in skin lesion(s). Neuro: No weakness, tremor, incoordination, spasms, paresthesia or pain. Psychiatric: Denies confusion, memory loss or sensory loss. Endo: Denies change in weight, skin or hair change.  Heme/Lymph: No excessive bleeding, bruising or enlarged lymph nodes.  Physical Exam  BP 126/84   Pulse 76   Temp (!) 97.1 F (36.2 C)   Resp 18   Ht 5\' 11"  (1.803 m)   Wt 260 lb 12.8 oz (118.3 kg)   BMI 36.37 kg/m   Appears  over nourished, well groomed  and in no distress.  Eyes: PERRLA, EOMs, conjunctiva no swelling or erythema. Sinuses: No frontal/maxillary tenderness ENT/Mouth: EAC's clear, TM's nl w/o erythema, bulging. Nares clear w/o erythema, swelling, exudates. Oropharynx clear without erythema or exudates. Oral hygiene is good. Tongue normal, non obstructing. Hearing  intact.  Neck: Supple. Thyroid not palpable. Car 2+/2+ without bruits, nodes or JVD. Chest: Respirations nl with BS clear & equal w/o rales, rhonchi, wheezing or stridor.  Cor: Heart sounds normal w/ regular rate and rhythm without sig. murmurs, gallops, clicks or rubs. Peripheral pulses normal and equal  without edema.  Abdomen: Soft & bowel sounds normal. Non-tender w/o guarding, rebound, hernias, masses or organomegaly.  Lymphatics: Unremarkable.  Musculoskeletal: Full ROM all peripheral extremities, joint stability, 5/5 strength and normal gait.  Skin: Warm, dry without exposed rashes, lesions or ecchymosis apparent.  Neuro: Cranial nerves intact, reflexes equal bilaterally. Sensory-motor testing grossly intact. Tendon reflexes grossly intact.  Pysch: Alert & oriented x 3.  Insight and judgement nl & appropriate. No ideations.  Assessment and Plan:  1. Essential hypertension  - Continue medication, monitor blood pressure at home.  - Continue DASH diet.  Reminder to go to the ER if any CP,  SOB, nausea, dizziness, severe HA, changes vision/speech.  - CBC with Differential/Platelet - COMPLETE METABOLIC PANEL WITH GFR - Magnesium - TSH  2. Hyperlipidemia, mixed  - Continue diet/meds, exercise,& lifestyle modifications.  - Continue  monitor periodic cholesterol/liver & renal functions   - Lipid panel - TSH  3. Prediabetes  - Continue diet, exercise,  - lifestyle modifications.  - Monitor appropriate labs.  - Hemoglobin A1c - Insulin, random  4. Vitamin D deficiency  - Continue supplementation.   - VITAMIN D 25 Hydroxyl  5. OSA on CPAP   6. Medication management  - CBC with Differential/Platelet - COMPLETE METABOLIC PANEL WITH GFR - Magnesium - Lipid panel - TSH - Hemoglobin A1c - Insulin, random - VITAMIN D 25 Hydroxyl         Discussed  regular exercise, BP monitoring, weight control to achieve/maintain BMI less than 25 and discussed med and SE's.  Recommended labs to assess and monitor clinical status with further disposition pending results of labs. Over 30 minutes of exam, counseling, chart review was performed.

## 2018-10-11 NOTE — Patient Instructions (Signed)

## 2018-10-12 ENCOUNTER — Ambulatory Visit: Payer: BC Managed Care – PPO | Admitting: Internal Medicine

## 2018-10-12 VITALS — BP 126/84 | HR 76 | Temp 97.1°F | Resp 18 | Ht 71.0 in | Wt 260.8 lb

## 2018-10-12 DIAGNOSIS — R7303 Prediabetes: Secondary | ICD-10-CM

## 2018-10-12 DIAGNOSIS — E782 Mixed hyperlipidemia: Secondary | ICD-10-CM | POA: Diagnosis not present

## 2018-10-12 DIAGNOSIS — G4733 Obstructive sleep apnea (adult) (pediatric): Secondary | ICD-10-CM

## 2018-10-12 DIAGNOSIS — Z79899 Other long term (current) drug therapy: Secondary | ICD-10-CM

## 2018-10-12 DIAGNOSIS — Z23 Encounter for immunization: Secondary | ICD-10-CM | POA: Diagnosis not present

## 2018-10-12 DIAGNOSIS — E559 Vitamin D deficiency, unspecified: Secondary | ICD-10-CM

## 2018-10-12 DIAGNOSIS — I10 Essential (primary) hypertension: Secondary | ICD-10-CM

## 2018-10-12 DIAGNOSIS — Z6836 Body mass index (BMI) 36.0-36.9, adult: Secondary | ICD-10-CM

## 2018-10-12 DIAGNOSIS — Z9989 Dependence on other enabling machines and devices: Secondary | ICD-10-CM

## 2018-10-13 LAB — LIPID PANEL
Cholesterol: 122 mg/dL (ref <200)
HDL: 35 mg/dL — ABNORMAL LOW (ref >40)
LDL Cholesterol (Calc): 63 mg/dL (calc)
Non-HDL Cholesterol (Calc): 87 mg/dL (calc) (ref <130)
Total CHOL/HDL Ratio: 3.5 (calc) (ref <5.0)
Triglycerides: 162 mg/dL — ABNORMAL HIGH (ref <150)

## 2018-10-13 LAB — COMPLETE METABOLIC PANEL WITH GFR
AG Ratio: 2.4 (calc) (ref 1.0–2.5)
ALT: 38 U/L (ref 9–46)
AST: 22 U/L (ref 10–40)
Albumin: 4.5 g/dL (ref 3.6–5.1)
Alkaline phosphatase (APISO): 78 U/L (ref 40–115)
BUN: 13 mg/dL (ref 7–25)
CO2: 28 mmol/L (ref 20–32)
Calcium: 9.7 mg/dL (ref 8.6–10.3)
Chloride: 104 mmol/L (ref 98–110)
Creat: 1.32 mg/dL (ref 0.60–1.35)
GFR, Est African American: 73 mL/min/{1.73_m2} (ref >=60)
GFR, Est Non African American: 63 mL/min/{1.73_m2} (ref >=60)
Globulin: 1.9 g/dL (calc) (ref 1.9–3.7)
Glucose, Bld: 97 mg/dL (ref 65–99)
Potassium: 4 mmol/L (ref 3.5–5.3)
Sodium: 140 mmol/L (ref 135–146)
Total Bilirubin: 0.7 mg/dL (ref 0.2–1.2)
Total Protein: 6.4 g/dL (ref 6.1–8.1)

## 2018-10-13 LAB — URINALYSIS, ROUTINE W REFLEX MICROSCOPIC
Bilirubin Urine: NEGATIVE
Glucose, UA: NEGATIVE
Hgb urine dipstick: NEGATIVE
Ketones, ur: NEGATIVE
Leukocytes, UA: NEGATIVE
Nitrite: NEGATIVE
Protein, ur: NEGATIVE
Specific Gravity, Urine: 1.018 (ref 1.001–1.03)
pH: 5.5 (ref 5.0–8.0)

## 2018-10-13 LAB — CBC WITH DIFFERENTIAL/PLATELET
Basophils Absolute: 58 cells/uL (ref 0–200)
Basophils Relative: 1 %
Eosinophils Absolute: 273 cells/uL (ref 15–500)
Eosinophils Relative: 4.7 %
HCT: 44.1 % (ref 38.5–50.0)
Hemoglobin: 15.4 g/dL (ref 13.2–17.1)
Lymphs Abs: 1282 cells/uL (ref 850–3900)
MCH: 31.8 pg (ref 27.0–33.0)
MCHC: 34.9 g/dL (ref 32.0–36.0)
MCV: 90.9 fL (ref 80.0–100.0)
MPV: 10.5 fL (ref 7.5–12.5)
Monocytes Relative: 12.6 %
Neutro Abs: 3457 cells/uL (ref 1500–7800)
Neutrophils Relative %: 59.6 %
Platelets: 34 10*3/uL — ABNORMAL LOW (ref 140–400)
RBC: 4.85 10*6/uL (ref 4.20–5.80)
RDW: 12.9 % (ref 11.0–15.0)
Total Lymphocyte: 22.1 %
WBC mixed population: 731 cells/uL (ref 200–950)
WBC: 5.8 10*3/uL (ref 3.8–10.8)

## 2018-10-13 LAB — HEMOGLOBIN A1C
Hgb A1c MFr Bld: 5.4 % of total Hgb (ref <5.7)
Mean Plasma Glucose: 108 (calc)
eAG (mmol/L): 6 (calc)

## 2018-10-13 LAB — VITAMIN D 25 HYDROXY (VIT D DEFICIENCY, FRACTURES): Vit D, 25-Hydroxy: 52 ng/mL (ref 30–100)

## 2018-10-13 LAB — MAGNESIUM: Magnesium: 2.1 mg/dL (ref 1.5–2.5)

## 2018-10-13 LAB — INSULIN, RANDOM: Insulin: 23.5 u[IU]/mL — ABNORMAL HIGH (ref 2.0–19.6)

## 2018-10-13 LAB — TSH: TSH: 2.53 mIU/L (ref 0.40–4.50)

## 2018-10-16 ENCOUNTER — Encounter: Payer: Self-pay | Admitting: Internal Medicine

## 2018-10-29 ENCOUNTER — Other Ambulatory Visit: Payer: Self-pay | Admitting: Physician Assistant

## 2018-10-29 DIAGNOSIS — Z79899 Other long term (current) drug therapy: Secondary | ICD-10-CM

## 2018-12-16 ENCOUNTER — Other Ambulatory Visit: Payer: Self-pay | Admitting: Internal Medicine

## 2018-12-16 DIAGNOSIS — F320 Major depressive disorder, single episode, mild: Secondary | ICD-10-CM

## 2019-01-14 ENCOUNTER — Other Ambulatory Visit: Payer: Self-pay | Admitting: Adult Health

## 2019-01-17 NOTE — Progress Notes (Signed)
FOLLOW UP  Assessment and Plan:   Hypertension  Currently well controlled off of medications Monitor blood pressure at home; patient to call if consistently greater than 130/80 Continue DASH diet.   Reminder to go to the ER if any CP, SOB, nausea, dizziness, severe HA, changes vision/speech, left arm numbness and tingling and jaw pain.  Cholesterol Continue medication  Continue low cholesterol diet and exercise.  Check lipid panel.   Prediabetes/abnormal glucose Recent A1Cs at goal Discussed diet/exercise, weight management  Defer A1C; check CMP  Obesity with co morbidities Long discussion about weight loss, diet, and exercise Discussed ideal weight for height and initial weight goal (262 lb) Patient will work on small healthy choices - 80% good - whole wheat wrap instead of chicken biscuit, etc, start swimming again, increase water intake Will follow up in 3 months  Vitamin D Def/ osteoporosis prevention Continue supplementation Check Vit D level  Recurrent major depressive disorder, in partial remission (HCC) Continue medications  Lifestyle discussed: diet/exerise, sleep hygiene, stress management, hydration  LUTS Slow urine stream previously improved by tamsulosin; has seen urology, PSAs well controlled. Will add finasteride, discussed takes 12-18 months for full benefit, will try switching to rapaflo, given 4 mg with instructions to start at 1 tab daily, titrate up to 8 mg if needed and call back to report beneficial dose.  Follow up in 3 months  Continue diet and meds as discussed. Further disposition pending results of labs. Discussed med's effects and SE's.   Over 30 minutes of exam, counseling, chart review, and critical decision making was performed.   Future Appointments  Date Time Provider Department Center  04/25/2019  3:45 PM [name], MD [facility] None     ----------------------------------------------------------------------------------------------------------------------  HPI 48 y.o. male  presents for 3 month follow up on hypertension, cholesterol, prediabetes, morbid obesity and vitamin D deficiency.   he has a diagnosis of recurrent major depression with anxious features and is currently on cymbalta 60 mg daily, wellbutrin 300 mg daily, with PRN xanax, reports symptoms are well controlled on current regimen. He reports using xanax 0.5 mg in the evening for sleep.  BMI is Body mass index is 38.08 kg/m., he has been working on diet and exercise and was down to 260lb before the holiday but gained back. He was snacking on fruit and nuts Wt Readings from Last 3 Encounters:  01/18/19 273 lb (123.8 kg)  10/12/18 260 lb 12.8 oz (118.3 kg)  05/18/18 265 lb 3.2 oz (120.3 kg)  Currently drinks 3-4 bottles  Today their BP is BP: 120/88  He does not workout. He denies chest pain, shortness of breath, dizziness.   He is on cholesterol medication, taking lipitor 40 mg daily and denies myalgias. His cholesterol is not at goal. The cholesterol last visit was:   Lab Results  Component Value Date   CHOL 122 10/12/2018   HDL 35 (L) 10/12/2018   LDLCALC 63 10/12/2018   TRIG 162 (H) 10/12/2018   CHOLHDL 3.5 10/12/2018    He has not been working on diet and exercise for prediabetes/abnormal glucose, and denies nausea, paresthesia of the feet, polydipsia, polyuria, visual disturbances and vomiting. Last A1C in the office was:  Lab Results  Component Value Date   HGBA1C 5.4 10/12/2018   Patient is on Vitamin D supplement but remained below goal at the last visit:    Lab Results  Component Value Date   VD25OH 52 10/12/2018     He is on flomax for  slow urine stream; has been on med for several years, was working well but not so much in the past 1-2 months;  Lab Results  Component Value Date   PSA 0.5 03/23/2018   PSA 0.4 02/18/2017   PSA 0.72  02/04/2016     Current Medications:  Current Outpatient Medications on File Prior to Visit  Medication Sig  . ALPRAZolam (XANAX) 1 MG tablet Take 1/2 to 1 tablet 2 to 3 x /day ONLY if needed for Panic / Anxiety Attacks & limit to 5 days /week to avoid addiction  . atorvastatin (LIPITOR) 80 MG tablet TAKE 1 TABLET BY MOUTH EVERY DAY  . buPROPion (WELLBUTRIN XL) 300 MG 24 hr tablet TAKE 1 TABLET BY MOUTH EVERY DAY IN THE MORNING  . Cholecalciferol (VITAMIN D3) 2000 units TABS Take 10,000 tablets by mouth daily.   . diphenhydrAMINE (BENADRYL) 25 MG tablet Take 25 mg by mouth every 6 (six) hours as needed for allergies.  . DULoxetine (CYMBALTA) 60 MG capsule TAKE 1 CAPSULE DAILY FOR MOOD & ANXIETY  . tamsulosin (FLOMAX) 0.4 MG CAPS capsule TAKE 1 CAPSULE AT BEDTIME FOR PROSTATE   No current facility-administered medications on file prior to visit.      Allergies:  Allergies  Allergen Reactions  . Codeine Other (See Comments)    "can's sleep"  . Effexor [Venlafaxine] Other (See Comments)    ED  . Prednisone Other (See Comments)    "can't sleep"  . Tramadol Hcl Itching  . Zinc Other (See Comments)    "stomache upset"     Medical History:  Past Medical History:  Diagnosis Date  . Abnormal glucose   . Borderline hypertension   . BPH (benign prostatic hypertrophy)   . Crushing injury of left elbow and forearm 07/13/2015  . 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 Hyperlipidemia   . Hypertension   . Hypogonadism male   . 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 Morbid obesity (HCC)   . Obesity (BMI 30-39.9)   . OSA on CPAP   . Prediabetes   . Right ureteral stone   . Sleep apnea    C PAP  . Vitamin D deficiency   . Wears glasses    Family history- Reviewed and unchanged Social history- Reviewed and unchanged   Review of Systems:  Review of Systems  Constitutional: Negative for malaise/fatigue and weight loss.  HENT: Negative  for hearing loss and tinnitus.   Eyes: Negative for blurred vision and double vision.  Respiratory: Negative for cough, shortness of breath and wheezing.   Cardiovascular: Negative for chest pain, palpitations, orthopnea, claudication and leg swelling.  Gastrointestinal: Negative for abdominal pain, blood in stool, constipation, diarrhea, heartburn, melena, nausea and vomiting.  Genitourinary: Negative for dysuria, flank pain, frequency, hematuria and urgency.       Endorses slow urine stream; denies nocturia, split stream, sensation of incomplete bladder emptying.   Musculoskeletal: Negative for joint pain and myalgias.  Skin: Negative for rash.  Neurological: Negative for dizziness, tingling, sensory change, weakness and headaches.  Endo/Heme/Allergies: Negative for polydipsia.  Psychiatric/Behavioral: Negative for depression.  All other systems reviewed and are negative.   Physical Exam: BP 120/88   Pulse 89   Temp (!) 97.5 F (36.4 C)   Ht 5\' 11"  (1.803 m)   Wt 273 lb (123.8 kg)   SpO2 96%   BMI 38.08 kg/m  Wt Readings from Last 3 Encounters:  01/18/19 273 lb (123.8 kg)  10/12/18 260 lb 12.8 oz (118.3 kg)  05/18/18 265 lb 3.2 oz (120.3 kg)   General Appearance: Well nourished, in no apparent distress. Eyes: PERRLA, EOMs, conjunctiva no swelling or erythema Sinuses: No Frontal/maxillary tenderness ENT/Mouth: Ext aud canals clear, TMs without erythema, bulging. No erythema, swelling, or exudate on post pharynx.  Tonsils not swollen or erythematous. Hearing normal.  Neck: Supple, thyroid normal.  Respiratory: Respiratory effort normal, BS equal bilaterally without rales, rhonchi, wheezing or stridor.  Cardio: RRR with no MRGs. Brisk peripheral pulses without edema.  Abdomen: Soft, + BS.  Non tender, no guarding, rebound, hernias, masses. Lymphatics: Non tender without lymphadenopathy.  Musculoskeletal: Full ROM, 5/5 strength, Normal gait Skin: Warm, dry without rashes,  lesions, ecchymosis.  Neuro: Cranial nerves intact. No cerebellar symptoms.  Psych: Awake and oriented X 3, normal affect, Insight and Judgment appropriate.   Shane MakerAshley C Marcus Groll, NP 4:08 PM [facility]

## 2019-01-18 ENCOUNTER — Ambulatory Visit: Payer: Self-pay | Admitting: Physician Assistant

## 2019-01-18 ENCOUNTER — Ambulatory Visit: Payer: BC Managed Care – PPO | Admitting: Adult Health

## 2019-01-18 ENCOUNTER — Encounter: Payer: Self-pay | Admitting: Adult Health

## 2019-01-18 VITALS — BP 120/88 | HR 89 | Temp 97.5°F | Ht 71.0 in | Wt 273.0 lb

## 2019-01-18 DIAGNOSIS — E291 Testicular hypofunction: Secondary | ICD-10-CM

## 2019-01-18 DIAGNOSIS — G4733 Obstructive sleep apnea (adult) (pediatric): Secondary | ICD-10-CM

## 2019-01-18 DIAGNOSIS — R7303 Prediabetes: Secondary | ICD-10-CM

## 2019-01-18 DIAGNOSIS — F3341 Major depressive disorder, recurrent, in partial remission: Secondary | ICD-10-CM

## 2019-01-18 DIAGNOSIS — I10 Essential (primary) hypertension: Secondary | ICD-10-CM

## 2019-01-18 DIAGNOSIS — Z87891 Personal history of nicotine dependence: Secondary | ICD-10-CM

## 2019-01-18 DIAGNOSIS — E782 Mixed hyperlipidemia: Secondary | ICD-10-CM

## 2019-01-18 DIAGNOSIS — Z9989 Dependence on other enabling machines and devices: Secondary | ICD-10-CM

## 2019-01-18 DIAGNOSIS — Z79899 Other long term (current) drug therapy: Secondary | ICD-10-CM

## 2019-01-18 DIAGNOSIS — R7309 Other abnormal glucose: Secondary | ICD-10-CM

## 2019-01-18 DIAGNOSIS — E559 Vitamin D deficiency, unspecified: Secondary | ICD-10-CM

## 2019-01-18 MED ORDER — SILODOSIN 4 MG PO CAPS: ORAL_CAPSULE | ORAL | 1 refills | Status: DC

## 2019-01-18 MED ORDER — FINASTERIDE 5 MG PO TABS: 5.0000 mg | ORAL_TABLET | Freq: Every day | ORAL | 1 refills | Status: DC

## 2019-01-18 NOTE — Patient Instructions (Addendum)
Goals    . DIET - INCREASE WATER INTAKE     Aim for 65-80+ fluid ounces daily     . Weight (lb) < 260 lb (117.9 kg)      Dizziness Dizziness is a common problem. It is a feeling of unsteadiness or light-headedness. You may feel like you are about to faint. Dizziness can lead to injury if you stumble or fall. Anyone can become dizzy, but dizziness is more common in older adults. This condition can be caused by a number of things, including medicines, dehydration, or illness. Follow these instructions at home: Eating and drinking  Drink enough fluid to keep your urine clear or pale yellow. This helps to keep you from becoming dehydrated. Try to drink more clear fluids, such as water.  Do not drink alcohol.  Limit your caffeine intake if told to do so by your health care provider. Check ingredients and nutrition facts to see if a food or beverage contains caffeine.  Limit your salt (sodium) intake if told to do so by your health care provider. Check ingredients and nutrition facts to see if a food or beverage contains sodium. Activity  Avoid making quick movements. ? Rise slowly from chairs and steady yourself until you feel okay. ? In the morning, first sit up on the side of the bed. When you feel okay, stand slowly while you hold onto something until you know that your balance is fine.  If you need to stand in one place for a long time, move your legs often. Tighten and relax the muscles in your legs while you are standing.  Do not drive or use heavy machinery if you feel dizzy.  Avoid bending down if you feel dizzy. Place items in your home so that they are easy for you to reach without leaning over. Lifestyle  Do not use any products that contain nicotine or tobacco, such as cigarettes and e-cigarettes. If you need help quitting, ask your health care provider.  Try to reduce your stress level by using methods such as yoga or meditation. Talk with your health care provider if you  need help to manage your stress. General instructions  Watch your dizziness for any changes.  Take over-the-counter and prescription medicines only as told by your health care provider. Talk with your health care provider if you think that your dizziness is caused by a medicine that you are taking.  Tell a friend or a family member that you are feeling dizzy. If he or she notices any changes in your behavior, have this person call your health care provider.  Keep all follow-up visits as told by your health care provider. This is important. Contact a health care provider if:  Your dizziness does not go away.  Your dizziness or light-headedness gets worse.  You feel nauseous.  You have reduced hearing.  You have new symptoms.  You are unsteady on your feet or you feel like the room is spinning. Get help right away if:  You vomit or have diarrhea and are unable to eat or drink anything.  You have problems talking, walking, swallowing, or using your arms, hands, or legs.  You feel generally weak.  You are not thinking clearly or you have trouble forming sentences. It may take a friend or family member to notice this.  You have chest pain, abdominal pain, shortness of breath, or sweating.  Your vision changes.  You have any bleeding.  You have a severe headache.  You  have neck pain or a stiff neck.  You have a fever. These symptoms may represent a serious problem that is an emergency. Do not wait to see if the symptoms will go away. Get medical help right away. Call your local emergency services (911 in the [place] Do not drive yourself to the hospital. Summary  Dizziness is a feeling of unsteadiness or light-headedness. This condition can be caused by a number of things, including medicines, dehydration, or illness.  Anyone can become dizzy, but dizziness is more common in older adults.  Drink enough fluid to keep your urine clear or pale yellow. Do not drink  alcohol.  Avoid making quick movements if you feel dizzy. Monitor your dizziness for any changes. This information is not intended to replace advice given to you by your health care provider. Make sure you discuss any questions you have with your health care provider. Document Released: 06/10/2001 Document Revised: 01/17/2017 Document Reviewed: 01/17/2017 Elsevier Interactive Patient Education  2019 Elsevier Inc.    Finasteride (Proscar) tablets What is this medicine? FINASTERIDE (fi NAS teer ide) is used to treat benign prostatic hyperplasia (BPH) in men. This is a condition that causes you to have an enlarged prostate. This medicine helps to control your symptoms, decrease urinary retention, and reduces your risk of needing surgery. When used in combination with certain other medicines, this drug can slow down the progression of your disease. This medicine may be used for other purposes; ask your health care provider or pharmacist if you have questions. COMMON BRAND NAME(S): Proscar What should I tell my health care provider before I take this medicine? They need to know if you have any of these conditions: -liver disease -an unusual or allergic reaction to finasteride, other medicines, foods, dyes, or preservatives -pregnant or trying to get pregnant -breast-feeding How should I use this medicine? Take this medicine by mouth with a glass of water. Follow the directions on the prescription label. You can take this medicine with or without food. Take your doses at regular intervals. Do not take your medicine more often than directed. Do not stop taking except on the advice of your doctor or health care professional. Talk to your pediatrician regarding the use of this medicine in children. Special care may be needed. Overdosage: If you think you have taken too much of this medicine contact a poison control center or emergency room at once. NOTE: This medicine is only for you. Do not share  this medicine with others. What if I miss a dose? If you miss a dose, take it as soon as you can. If it is almost time for your next dose, take only that dose. Do not take double or extra doses. What may interact with this medicine? -saw palmetto or other dietary supplements This list may not describe all possible interactions. Give your health care provider a list of all the medicines, herbs, non-prescription drugs, or dietary supplements you use. Also tell them if you smoke, drink alcohol, or use illegal drugs. Some items may interact with your medicine. What should I watch for while using this medicine? Do not donate blood while you are taking this medicine. This will prevent giving this medicine to a pregnant male through a blood transfusion. Ask your doctor or health care professional when it is safe to donate blood after you stop taking this medicine. Women who are pregnant or may get pregnant must not handle broken or crushed finasteride tablets. The active ingredient could harm the unborn  baby. If a pregnant woman comes into contact with broken or crushed tablets she should check with her doctor or health care professional. Exposure to whole tablets is not expected to cause harm as long as they are not swallowed. Contact your doctor or health care professional if your symptoms do not start to get better. You may need to take this medicine for 6 to 12 months to get the best results. This medicine can interfere with PSA laboratory tests for prostate cancer. If you are scheduled to have a lab test for prostate cancer, tell your doctor or health care professional that you are taking this medicine. This medicine may increase your risk of getting some cancers, like breast cancer. Talk with your doctor. What side effects may I notice from receiving this medicine? Side effects that you should report to your doctor or health care professional as soon as possible: -any signs of an allergic reaction  like rash, itching, hives or swelling of the lips or face -changes in breast like lumps, pain or fluids leaking from the nipple -pain in the testicles Side effects that usually do not require medical attention (report to your doctor or health care professional if they continue or are bothersome): -sexual difficulties like decreased sexual desire or ability to get an erection -small amount of semen released during sex This list may not describe all possible side effects. Call your doctor for medical advice about side effects. You may report side effects to FDA at 1-800-FDA-1088. Where should I keep my medicine? Keep out of the reach of children. Store at room temperature below 30 degrees C (86 degrees F). Protect from light. Keep container tightly closed. Throw away any unused medicine after the expiration date. NOTE: This sheet is a summary. It may not cover all possible information. If you have questions about this medicine, talk to your doctor, pharmacist, or health care provider.  2019 Elsevier/Gold Standard (2015-08-02 17:24:30)

## 2019-01-19 LAB — LIPID PANEL
Cholesterol: 146 mg/dL (ref <200)
HDL: 37 mg/dL — ABNORMAL LOW (ref >40)
LDL Cholesterol (Calc): 82 mg/dL (calc)
Non-HDL Cholesterol (Calc): 109 mg/dL (calc) (ref <130)
Total CHOL/HDL Ratio: 3.9 (calc) (ref <5.0)
Triglycerides: 161 mg/dL — ABNORMAL HIGH (ref <150)

## 2019-01-19 LAB — CBC WITH DIFFERENTIAL/PLATELET
Absolute Monocytes: 814 cells/uL (ref 200–950)
Basophils Absolute: 101 cells/uL (ref 0–200)
Basophils Relative: 1.4 %
Eosinophils Absolute: 331 cells/uL (ref 15–500)
Eosinophils Relative: 4.6 %
HCT: 42.4 % (ref 38.5–50.0)
Hemoglobin: 15.1 g/dL (ref 13.2–17.1)
Lymphs Abs: 1843 cells/uL (ref 850–3900)
MCH: 32.6 pg (ref 27.0–33.0)
MCHC: 35.6 g/dL (ref 32.0–36.0)
MCV: 91.6 fL (ref 80.0–100.0)
MPV: 11.2 fL (ref 7.5–12.5)
Monocytes Relative: 11.3 %
Neutro Abs: 4111 cells/uL (ref 1500–7800)
Neutrophils Relative %: 57.1 %
Platelets: 51 10*3/uL — ABNORMAL LOW (ref 140–400)
RBC: 4.63 10*6/uL (ref 4.20–5.80)
RDW: 12.9 % (ref 11.0–15.0)
Total Lymphocyte: 25.6 %
WBC: 7.2 10*3/uL (ref 3.8–10.8)

## 2019-01-19 LAB — COMPLETE METABOLIC PANEL WITH GFR
AG Ratio: 2 (calc) (ref 1.0–2.5)
ALT: 38 U/L (ref 9–46)
AST: 25 U/L (ref 10–40)
Albumin: 4.4 g/dL (ref 3.6–5.1)
Alkaline phosphatase (APISO): 74 U/L (ref 40–115)
BUN/Creatinine Ratio: 9 (calc) (ref 6–22)
BUN: 13 mg/dL (ref 7–25)
CO2: 29 mmol/L (ref 20–32)
Calcium: 9.7 mg/dL (ref 8.6–10.3)
Chloride: 104 mmol/L (ref 98–110)
Creat: 1.4 mg/dL — ABNORMAL HIGH (ref 0.60–1.35)
GFR, Est African American: 68 mL/min/{1.73_m2} (ref >=60)
GFR, Est Non African American: 59 mL/min/{1.73_m2} — ABNORMAL LOW (ref >=60)
Globulin: 2.2 g/dL (calc) (ref 1.9–3.7)
Glucose, Bld: 87 mg/dL (ref 65–99)
Potassium: 4.4 mmol/L (ref 3.5–5.3)
Sodium: 142 mmol/L (ref 135–146)
Total Bilirubin: 0.6 mg/dL (ref 0.2–1.2)
Total Protein: 6.6 g/dL (ref 6.1–8.1)

## 2019-01-19 LAB — TSH: TSH: 2.87 mIU/L (ref 0.40–4.50)

## 2019-02-25 ENCOUNTER — Ambulatory Visit: Payer: BC Managed Care – PPO | Admitting: Adult Health Nurse Practitioner

## 2019-02-25 ENCOUNTER — Encounter: Payer: Self-pay | Admitting: Adult Health Nurse Practitioner

## 2019-02-25 VITALS — BP 102/70 | HR 79 | Temp 97.9°F | Ht 71.0 in | Wt 265.8 lb

## 2019-02-25 DIAGNOSIS — J0140 Acute pansinusitis, unspecified: Secondary | ICD-10-CM | POA: Diagnosis not present

## 2019-02-25 DIAGNOSIS — I10 Essential (primary) hypertension: Secondary | ICD-10-CM | POA: Diagnosis not present

## 2019-02-25 MED ORDER — AZITHROMYCIN 250 MG PO TABS: ORAL_TABLET | ORAL | 0 refills | Status: DC

## 2019-02-25 MED ORDER — DEXAMETHASONE SODIUM PHOSPHATE 10 MG/ML IJ SOLN
10.0000 mg | Freq: Once | INTRAMUSCULAR | Status: AC
  Administered 2019-02-25: 10 mg via INTRAMUSCULAR

## 2019-02-25 NOTE — Progress Notes (Signed)
Assessment and Plan:  Shane Saunders was seen today for acute visit.  Diagnoses and all orders for this visit:  Acute non-recurrent pansinusitis  Pick one from below: Zyrtec / Cetirizine, regular not D  This will help to dry up the fluid draining Take 10mg  by mouth May cause drowsiness, take nightly Be sure to drink plenty of water If this is not effective, try Xyzal or Allegra  OR  Xyzal / Levocetirazine  Take 5mg  by mouth May cause drowsiness, take nightly Be sure to drink plenty of water If this is not effective try Allegra or Zyrtec  OR  Allegra / fexofenadine Take 180mg  by mouth daily If this is not effective try Zyrtec or Xyzal   Ibuprofen / Advil / Motrin - Will also help with inflammation  600mg  every six hours OR 800mg  every 8 hours If you have this at home each tablet is 200mg .  For Sinus Pain:  Tylenol Extra Strength 500mg  Take 1-2 tablets every 8 hours as needed for fever or pain Do not take more than 4,000mg  to tylenol in 24 hours  May rotate between Tylenol and ibuprofen.  They are different.   -     azithromycin (ZITHROMAX) 250 MG tablet; Take 2 tablets PO on day 1, then 1 tablet PO Q24H x 4 days -     dexamethasone (DECADRON) injection 10 mg in office  Essential hypertension Continue to monitor: Monitor blood pressure at home; call if consistently over 130/80 Continue DASH diet.   Reminder to go to the ER if any CP, SOB, nausea, dizziness, severe HA, changes vision/speech, left arm numbness and tingling and jaw pain.  Morbid obesity (HCC) Discussed dietary and exercise modifications Continue to monitor    Discussed hospital precautions with patient and agrees with plan of care.  May contact office via phone [phone] or MyChart.   Follow up prn for with new or worsening symptoms   Further disposition pending results of labs. Discussed med's effects and SE's.   Over 30 minutes of exam, counseling, chart review, and critical decision making was  performed.   Future Appointments  Date Time Provider Department Center  02/25/2019 10:00 AM Elder Negus, NP [facility] None  04/25/2019  3:45 PM [name], MD [facility] None    ------------------------------------------------------------------------------------------------------------------   HPI 48 y.o.male presents for sinus symptoms.  Reports he has been fighting these symptoms for the past three weeks.  Reports he is having excessive post nasal drip and blowing nose frequently.  Reports he is having fullness in his ear the popping.  He has tried Claritin D and Tylenol sinus pressure medication.  Denies a sore throat or cough.  He is having increasing sinus tenderness and reports discomfort when he bends over to tie shoes.  Reports he has also tried Benadryl, and three days ago tried Zyrtec but it has not been improving his symptoms.   Past Medical History:  Diagnosis Date  . Abnormal glucose   . Borderline hypertension   . BPH (benign prostatic hypertrophy)   . Crushing injury of left elbow and forearm 07/13/2015  . 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 Hyperlipidemia   . Hypertension   . Hypogonadism male   . 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 Morbid obesity (HCC)   . Obesity (BMI 30-39.9)   . OSA on CPAP   . Prediabetes   . Right ureteral stone   . Sleep apnea    C  PAP  . Vitamin D deficiency   . Wears glasses      Allergies  Allergen Reactions  . Codeine Other (See Comments)    "can's sleep"  . Effexor [Venlafaxine] Other (See Comments)    ED  . Prednisone Other (See Comments)    "can't sleep"  . Tramadol Hcl Itching  . Zinc Other (See Comments)    "stomache upset"    Current Outpatient Medications on File Prior to Visit  Medication Sig  . ALPRAZolam (XANAX) 1 MG tablet Take 1/2 to 1 tablet 2 to 3 x /day ONLY if needed for Panic / Anxiety Attacks & limit to 5 days /week to avoid addiction  .  atorvastatin (LIPITOR) 80 MG tablet TAKE 1 TABLET BY MOUTH EVERY DAY  . buPROPion (WELLBUTRIN XL) 300 MG 24 hr tablet TAKE 1 TABLET BY MOUTH EVERY DAY IN THE MORNING  . Cholecalciferol (VITAMIN D3) 2000 units TABS Take 10,000 tablets by mouth daily.   . diphenhydrAMINE (BENADRYL) 25 MG tablet Take 25 mg by mouth every 6 (six) hours as needed for allergies.  . DULoxetine (CYMBALTA) 60 MG capsule TAKE 1 CAPSULE DAILY FOR MOOD & ANXIETY  . finasteride (PROSCAR) 5 MG tablet Take 1 tablet (5 mg total) by mouth daily. For prostate.  . silodosin (RAPAFLO) 4 MG CAPS capsule Take 1-2 tabs daily for urine flow daily (take instead of flomax).   No current facility-administered medications on file prior to visit.     ROS: Review of Systems  Constitutional: Negative for chills, diaphoresis, fever, malaise/fatigue and weight loss.  HENT: Positive for sinus pain. Negative for congestion, ear discharge, ear pain, hearing loss, nosebleeds, sore throat and tinnitus.   Respiratory: Negative for cough, hemoptysis, sputum production, shortness of breath, wheezing and stridor.   Cardiovascular: Negative for chest pain, palpitations, orthopnea, claudication, leg swelling and PND.  Gastrointestinal: Negative for abdominal pain, blood in stool, constipation, diarrhea, heartburn, melena, nausea and vomiting.  Genitourinary: Negative for dysuria, flank pain, frequency, hematuria and urgency.  Skin: Negative for itching and rash.    Physical Exam:  BP 102/70   Pulse 79   Temp 97.9 F (36.6 C)   Ht 5\' 11"  (1.803 m)   Wt 265 lb 12.8 oz (120.6 kg)   SpO2 97%   BMI 37.07 kg/m   General Appearance: Well nourished, in no apparent distress. Eyes: PERRLA, EOMs, conjunctiva no swelling or erythema Sinuses: Frontal/maxillary tenderness ENT/Mouth: Ext aud canals clear, TMs without erythema, bulging. Serous noted behind bilateral TM's. No erythema, swelling, or exudate on post pharynx.  Tonsils not swollen or  erythematous. Hearing normal.  Neck: Supple, thyroid normal.  Respiratory: Respiratory effort normal, BS equal bilaterally without rales, rhonchi, wheezing or stridor.  Cardio: RRR with no MRGs. Brisk peripheral pulses without edema.  Abdomen: Soft, + BS.  Non tender, no guarding, rebound, hernias, masses. Lymphatics: Anterior cervical tender with lymphadenopathy.  Remaining lymph without tenderness or lymphadenopathy.   Musculoskeletal: Full ROM, 5/5 strength, normal gait.  Skin: Warm, dry without rashes, lesions, ecchymosis.  Neuro: Cranial nerves intact. Normal muscle tone, no cerebellar symptoms. Sensation intact.  Psych: Awake and oriented X 3, normal affect, Insight and Judgment appropriate.     Elder Negus, NP 9:58 AM [facility]

## 2019-02-25 NOTE — Patient Instructions (Addendum)
Today you are being treated for:   Sinusitis  Treat the symptoms!  Please take these medications:     Allergy Symptoms / Runny Nose:  Zyrtec / Cetirizine, regular not D  This will help to dry up the fluid draining Take 10mg  by mouth May cause drowsiness, take nightly Be sure to drink plenty of water If this is not effective, try Xyzal or Allegra  OR  Xyzal / Levocetirazine  Take 5mg  by mouth May cause drowsiness, take nightly Be sure to drink plenty of water If this is not effective try Allegra or Zyrtec  OR  Allegra / fexofenadine Take 180mg  by mouth daily If this is not effective try Zyrtec or Xyzal     Fevers:  Tylenol Extra Strength 500mg  Take 1-2 tablets every 8 hours as needed for fever or pain Do not take more than 4,000mg  to tylenol in 24 hours  May rotate between Tylenol and ibuprofen.  They are different.   Ibuprofen / Advil / Motrin - Will also help with inflammation  600mg  every six hours OR 800mg  every 8 hours If you have this at home each tablet is 200mg .   Antibiotic: Azithromyacin take two tablets today then one tablet daily until gone.  General Care:  Drink plenty of clear fluids  Get plenty of rest  Wash hands frequently and sanitize shared surfaces, kitchens, bathrooms.  Call or return with new or worsening symptoms

## 2019-03-16 ENCOUNTER — Other Ambulatory Visit: Payer: Self-pay | Admitting: Internal Medicine

## 2019-03-30 ENCOUNTER — Telehealth: Payer: BC Managed Care – PPO | Admitting: Adult Health Nurse Practitioner

## 2019-03-30 ENCOUNTER — Encounter: Payer: Self-pay | Admitting: Adult Health Nurse Practitioner

## 2019-03-30 DIAGNOSIS — R599 Enlarged lymph nodes, unspecified: Secondary | ICD-10-CM

## 2019-03-30 DIAGNOSIS — J0141 Acute recurrent pansinusitis: Secondary | ICD-10-CM

## 2019-03-30 DIAGNOSIS — J029 Acute pharyngitis, unspecified: Secondary | ICD-10-CM

## 2019-03-30 DIAGNOSIS — R000 Tachycardia, unspecified: Secondary | ICD-10-CM

## 2019-03-30 DIAGNOSIS — R062 Wheezing: Secondary | ICD-10-CM

## 2019-03-30 MED ORDER — LEVOFLOXACIN 750 MG PO TABS: 750.0000 mg | ORAL_TABLET | Freq: Every day | ORAL | 0 refills | Status: AC

## 2019-03-30 NOTE — Patient Instructions (Addendum)
Today you had a video visit with Elder Negus, DNP.  Below is a summary of your visit.   Today you are being treated for:  Sinusitis, treatment failure from 02/25/19.    We will send in Levofloxacin 750mg  once daily for five days.  Check with [facility] regarding use of antibiotic above, ibuprofen and sudafed.  Please let me know if your symptoms do not improve after your appointment.  Please take these medications:     Sinus Congestion:  Sudafed (pseudoephedrine) Nasal decongestant Take one tablet every 6 hours as needed Check packaging instructions IF you take blood pressure medication, this medication can raise your blood pressure. Monitor your blood pressure and if elevation occurs, stop medication. You can get this at any pharmacy, will need your drivers license to purchase this   [facility] Sinus Rinse / Neti Pot Use warm bottled or distilled water, dissolve packet in warm water DO NOT use tap water! Use twice a day as needed This will help to sooth irritated sinuses and clear nasal congestion If using nasal sprays, do so after completing this.   Sinus Inflammation / Pain:  Ibuprofen / Advil / Motrin - Will also help with inflammation  600mg  every six hours OR 800mg  every 8 hours If you have this at home each tablet is 200mg .  May rotate between Tylenol and ibuprofen.  They are different.   Tylenol Extra Strength 500mg  Take 1-2 tablets every 8 hours as needed for fever or pain Do not take more than 4,000mg  to tylenol in 24 hours   Antibiotic: Take as directed on your prescription   Sore Throat:  This should improve after allergy identified.  In the mean time.  Cepacol lozenges as needed You can get these at any pharmacy  Throat Spray: Use as directed on package as needed You may get this at any pharmacy  Warm / Cold drinks: This will help to sooth your throat  Gargle warm salt water: Gargle then spit water out, do not swallow   General  Care:  Drink plenty of clear fluids  Get plenty of rest  Wash hands frequently and sanitize shared surfaces, kitchens, bathrooms.  Call or return with new or worsening symptoms

## 2019-03-30 NOTE — Progress Notes (Signed)
Virtual Visit via Video Note  I connected with Shane Saunders on 03/30/19 at  by a video enabled telemedicine application and verified that I am speaking with the correct person using two identifiers.  Patient was informed I was in my home, only person in the room, in my office.   I discussed the limitations of evaluation and management by telemedicine and the availability of in person appointments. The patient expressed understanding and agreed to proceed.  History of Present Illness: Shane Saunders is a 48 yo single, caucasian male presenting for video visit today regarding unresolved symptoms from previous visit on 02/25/19.  At that time he received an in office dexamethasone injection and azithromyacin along with antihistamine suggestions to help with severe rhinitis.  Reports his symptoms improved for about four days.  He reports the zyrtec would help some but not lasting throughout the entire day.  He also attempted switching to other antihistamines suggested, ie allegra, zyxal & singular.  Reports some improvement with the rhinitis but he now has sinus congestion with continued drainage down the back of his throat.  Reports the sore throat is intermittent and worse in the mornings until he is able to clear his throat and drink some warm liquids.  His symptoms are worse in the morning and when he lays down at night.  He also reports ear fullness, bilaterally.  He reports cervical lymph tenderness.  Denies any fever of chills.  He has also taken acetaminophen for his sinus pain and headache.  Reports the headache has been consistent for the past three days.    He provided vitals today with equipment from home. He also denies recent travel, cough or shortness of breath or known exposure to anyone with COVID-19.  Vitals:   03/30/19 1415  BP: 110/82  Pulse: (!) 106  Resp: 16  Temp: 97.6 F (36.4 C)     Review of Systems : Constitutional: Negative for chills, diaphoresis, fever,  malaise/fatigue and weight loss.  HENT: Positive for congestion, ear pain, sinus pain and sore throat. Negative for ear discharge, hearing loss and tinnitus.   Eyes: Negative for blurred vision, double vision, photophobia, pain, discharge and redness.  Respiratory: Negative for stridor.   Gastrointestinal: Negative for abdominal pain, blood in stool, constipation, diarrhea, heartburn, melena, nausea and vomiting.  Skin: Negative for itching and rash.  Neurological: Positive for headaches. Negative for dizziness, tingling, tremors, sensory change, speech change, focal weakness, seizures, loss of consciousness and weakness.     Observations/Objective: Patient is alert and oriented and able to carry on conversation without complications.  He does not appear in any distress, no shortness of breath, audiable wheezing or coughing noted during conversation.  Instructed patient to lean  Forward toward his knees, while he was sitting, endorses tenderness to frontal and maxillary sinuses.  Also reports tenderness with self palpation to these areas.    Assessment and Plan:  Diagnoses and all orders for this visit:  Acute recurrent pansinusitis -     levofloxacin (LEVAQUIN) 750 MG tablet; Take 1 tablet (750 mg total) by mouth daily for 5 days. You may take: buprofen / Advil / Motrin - for pain swelling of throat  600mg  every six hours OR 800mg  every 8 hours If you have this at home each tablet is 200mg .   AND / OR  Tylenol Extra Strength 500mg  - for pain Take 1-2 tablets every 8 hours as needed for fever or pain Do not take more than 4,000mg  to  tylenol in 24 hours  Sore throat OTC lozenges or throat spray Gargle walt salt water, spit out Warm / cold liquids   General Care: Drink plenty of clear fluids Get plenty of rest Wash hands frequently and sanitize shared surfaces, kitchens, bathrooms.  Swollen lymph nodes Continue to monitor  Tachycardia Discussed increasing water  intake  Wheezing Intermittent and likely from drainage.  Monitor symptoms Contact office with worsening or persistent wheezing or shortness of breath.   Follow Up Instructions:    I discussed the assessment and treatment plan with the patient. The patient was provided an opportunity to ask questions and all were answered. The patient agreed with the plan and demonstrated an understanding of the instructions.   The patient was advised to call back or seek an in-person evaluation if the symptoms worsen or if the condition fails to improve as anticipated.  After visit summary provided to patient via MyChart.  Acknowledged receiving this.  I provided 15 minutes of non-face-to-face time during this encounter.  Elder Negus, NP [facility] 03/30/2019  2:37 PM

## 2019-04-07 ENCOUNTER — Encounter: Payer: Self-pay | Admitting: Internal Medicine

## 2019-04-18 ENCOUNTER — Other Ambulatory Visit: Payer: Self-pay | Admitting: Internal Medicine

## 2019-04-24 ENCOUNTER — Encounter: Payer: Self-pay | Admitting: Internal Medicine

## 2019-04-24 NOTE — Progress Notes (Signed)
[facility]   [name], M.D.     Dyanne Carrel. Steffanie Dunn, P.A.-C Judd Gaudier, DNP [facility]                [address]                [place], [place]. [address] Telephone [phone] Telefax [phone] Annual  Screening/Preventative Visit  & Comprehensive Evaluation & Examination  History of Present Illness:     This very nice 48 y.o. MWM presents for a Screening /Preventative Visit & comprehensive evaluation and management of multiple medical co-morbidities.  Patient has been followed for HTN, HLD, Prediabetes and Vitamin D Deficiency.     Patient has hx/o Depression and admits tendencies to depressed mood and has been intolerant to various meds due to sexual SE's and is currently on Cymbalta.       HTN predates since 2010. Patient's BP has been controlled at home.  Today's BP is at goal - 126/84. Patient denies any cardiac symptoms as chest pain, palpitations, shortness of breath, dizziness or ankle swelling.     Patient's hyperlipidemia is controlled with diet and Atorvastatin. Patient denies myalgias or other medication SE's. Last lipids were at goal albeit sl elevated Trig's:  Lab Results  Component Value Date   CHOL 146 01/18/2019   HDL 37 (L) 01/18/2019   LDLCALC 82 01/18/2019   TRIG 161 (H) 01/18/2019   CHOLHDL 3.9 01/18/2019      Patient has Morbid Obesity (BMI 37+) & consequent hx/o prediabetes (A1c 5.8% / 2016).  Patient denies reactive hypoglycemic symptoms, visual blurring, diabetic polys or paresthesias. Last A1c was Normal & at goal: Lab Results  Component Value Date   HGBA1C 5.4 10/12/2018       Finally, patient has history of Vitamin D Deficiency  ("33" / 2008 and "29" / 2016)  and last vitamin D was at goal: Lab Results  Component Value Date   VD25OH 52 10/12/2018   Current Outpatient Medications on File Prior to Visit  Medication Sig  . ALPRAZolam (XANAX) 1 MG tablet TAKE 1/2 TO 1  TAB BY MOUTH 2TO3 TIMES A DAY AS NEEDED FORPANIC/ANXIETY ATTACKS. LIMIT 5DAYS PER WEEK  . atorvastatin (LIPITOR) 80 MG tablet TAKE 1 TABLET BY MOUTH EVERY DAY  . buPROPion (WELLBUTRIN XL) 300 MG 24 hr tablet TAKE 1 TABLET BY MOUTH EVERY DAY IN THE MORNING  . Cholecalciferol (VITAMIN D3) 2000 units TABS Take 10,000 tablets by mouth daily.   . DULoxetine (CYMBALTA) 60 MG capsule TAKE 1 CAPSULE DAILY FOR MOOD & ANXIETY  . finasteride (PROSCAR) 5 MG tablet Take 1 tablet (5 mg total) by mouth daily. For prostate.  . silodosin (RAPAFLO) 4 MG CAPS capsule Take 1-2 tabs daily for urine flow daily (take instead of flomax).   No current facility-administered medications on file prior to visit.    Allergies  Allergen Reactions  . Codeine Other (See Comments)    "can's sleep"  . Effexor [Venlafaxine] Other (See Comments)    ED  . Prednisone Other (See Comments)    "can't sleep"  . Tramadol Hcl Itching  . Zinc Other (See Comments)    "stomache upset"   Past Medical History:  Diagnosis Date  . Abnormal glucose   . Borderline hypertension   . BPH (benign prostatic hypertrophy)   . Crushing injury of left elbow and forearm 07/13/2015  . 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 Hyperlipidemia   .  Hypertension   . Hypogonadism male   . 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 Morbid obesity (HCC)   . Obesity (BMI 30-39.9)   . OSA on CPAP   . Prediabetes   . Right ureteral stone   . Sleep apnea    C PAP  . Vitamin D deficiency   . Wears glasses    Health Maintenance  Topic Date Due  . INFLUENZA VACCINE  07/30/2019  . TETANUS/TDAP  05/31/2024  . HIV Screening  Completed   Immunization History  Administered Date(s) Administered  . Influenza Inj Mdck Quad With Preservative 10/12/2018  . Influenza-Unspecified 10/20/2014  . PPD Test 01/16/2015, 02/18/2017, 03/23/2018, 04/25/2019  . Pneumococcal-Unspecified 10/27/2006  . Tdap 08/05/2013,  05/31/2014   Past Surgical History:  Procedure Laterality Date  . 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 ARTERY REPAIR Right 05/31/2014   Procedure: IRRIGATION, EXPLORATION, AND REPAIR OF RIGHT ARM WOUND;  Surgeon: Cherylynn Ridges, MD;  Location: [facility] OR;  Service: General;  Laterality: Right;  . CYSTOSCOPY WITH RETROGRADE PYELOGRAM, URETEROSCOPY AND STENT PLACEMENT Right 07/11/2016   Procedure: CYSTOSCOPY WITH RETROGRADE PYELOGRAM, URETEROSCOPY, BASKET STONE EXTRACTION  AND STENT PLACEMENT;  Surgeon: Sebastian Ache, MD;  Location: [facility];  Service: Urology;  Laterality: Right;  45 MINS  C-ARM DIGITAL URETEROSCOPE HOLMIUM LASER  . ESOPHAGOGASTRODUODENOSCOPY     Family History  Problem Relation Age of Onset  . Hypertension Mother   . Migraines Mother   . Thyroid disease Mother   . Hypertension Father   . Hyperlipidemia Father   . Diabetes Father   . Cancer Sister        thyroid  . CAD Paternal Grandfather    Social History   Socioeconomic History  . Marital status: Married    Spouse name: Angelica Chessman  . Number of children: 2  Occupational History  . Machinist  Tobacco Use  . Smoking status: Former Smoker    Packs/day: 0.50    Years: 10.00    Pack years: 5.00    Types: Cigarettes    Last attempt to quit: 10/27/2009    Years since quitting: 9.4  . Smokeless tobacco: Former Neurosurgeon    Types: Snuff  Substance and Sexual Activity  . Alcohol use: Yes    Comment: occasional  . Drug use: No  . Sexual activity: Yes    Comment: Vasectomy    ROS Constitutional: Denies fever, chills, weight loss/gain, headaches, insomnia,  night sweats or change in appetite. Does c/o fatigue. Eyes: Denies redness, blurred vision, diplopia, discharge, itchy or watery eyes.  ENT: Denies discharge, congestion, post nasal drip, epistaxis, sore throat, earache, hearing loss, dental pain, Tinnitus, Vertigo, Sinus  pain or snoring.  Cardio: Denies chest pain, palpitations, irregular heartbeat, syncope, dyspnea, diaphoresis, orthopnea, PND, claudication or edema Respiratory: denies cough, dyspnea, DOE, pleurisy, hoarseness, laryngitis or wheezing.  Gastrointestinal: Denies dysphagia, heartburn, reflux, water brash, pain, cramps, nausea, vomiting, bloating, diarrhea, constipation, hematemesis, melena, hematochezia, jaundice or hemorrhoids Genitourinary: Denies dysuria, frequency, urgency, nocturia, hesitancy, discharge, hematuria or flank pain Musculoskeletal: Denies arthralgia, myalgia, stiffness, Jt. Swelling, pain, limp or strain/sprain. Denies Falls. Skin: Denies puritis, rash, hives, warts, acne, eczema or change in skin lesion Neuro: No weakness, tremor, incoordination, spasms, paresthesia or pain Psychiatric: Denies confusion, memory loss or sensory loss. Denies Depression. Endocrine: Denies change in weight, skin, hair change, nocturia, and  paresthesia, diabetic polys, visual blurring or hyper / hypo glycemic episodes.  Heme/Lymph: No excessive bleeding, bruising or enlarged lymph nodes.  Physical Exam  BP 126/84   Pulse 64   Temp (!) 97.3 F (36.3 C)   Resp 16   Ht 5\' 11"  (1.803 m)   Wt 272 lb 6.4 oz (123.6 kg)   BMI 37.99 kg/m   General Appearance: Well nourished and well groomed and in no apparent distress.  Eyes: PERRLA, EOMs, conjunctiva no swelling or erythema, normal fundi and vessels. Sinuses: No frontal/maxillary tenderness ENT/Mouth: EACs patent / TMs  nl. Nares clear without erythema, swelling, mucoid exudates. Oral hygiene is good. No erythema, swelling, or exudate. Tongue normal, non-obstructing. Tonsils not swollen or erythematous. Hearing normal.  Neck: Supple, thyroid not palpable. No bruits, nodes or JVD. Respiratory: Respiratory effort normal.  BS equal and clear bilateral without rales, rhonci, wheezing or stridor. Cardio: Heart sounds are normal with regular rate and  rhythm and no murmurs, rubs or gallops. Peripheral pulses are normal and equal bilaterally without edema. No aortic or femoral bruits. Chest: symmetric with normal excursions and percussion.  Abdomen: Soft, with Nl bowel sounds. Nontender, no guarding, rebound, hernias, masses, or organomegaly.  Lymphatics: Non tender without lymphadenopathy.  Musculoskeletal: Full ROM all peripheral extremities, joint stability, 5/5 strength, and normal gait. Skin: Warm and dry without rashes, lesions, cyanosis, clubbing or  ecchymosis.  Neuro: Cranial nerves intact, reflexes equal bilaterally. Normal muscle tone, no cerebellar symptoms. Sensation intact.  Pysch: Alert and oriented X 3 with normal affect, insight and judgment appropriate.   Assessment and Plan  1. Annual Preventative/Screening Exam    2. Essential hypertension  - EKG 12-Lead - Urinalysis, Routine w reflex microscopic - Microalbumin / creatinine urine ratio - CBC with Differential/Platelet - COMPLETE METABOLIC PANEL WITH GFR - Magnesium - TSH  3. Hyperlipidemia, mixed  - EKG 12-Lead - Lipid panel - TSH  4. Abnormal glucose  - EKG 12-Lead - Hemoglobin A1c - Insulin, random  5. Vitamin D deficiency  - VITAMIN D 25 Hydroxyl  6. Prediabetes  - EKG 12-Lead - Hemoglobin A1c - Insulin, random  7. OSA on CPAP   8. Testosterone deficiency  - Testosterone  9. Screening for colorectal cancer  - POC Hemoccult Bld/Stl  10. Screening examination for pulmonary tuberculosis  - TB Skin Test  11. BPH with obstruction/lower urinary tract symptoms  - PSA  12. Prostate cancer screening  - PSA  13. Screening for ischemic heart disease  - EKG 12-Lead  14. Family history of hypertension  - EKG 12-Lead  15. Former smoker  - EKG 12-Lead  16. Fatigue  - Iron,Total/Total Iron Binding Cap - Vitamin B12 - Testosterone - CBC with Differential/Platelet - TSH  17. Medication management  - Urinalysis, Routine  w reflex microscopic - Microalbumin / creatinine urine ratio - CBC with Differential/Platelet - COMPLETE METABOLIC PANEL WITH GFR - Magnesium - Lipid panel - TSH - Hemoglobin A1c - Insulin, random - VITAMIN D 25 Hydroxyl             Patient was counseled in prudent diet, weight control to achieve/maintain BMI less than 25, BP monitoring, regular exercise and medications as discussed.  Discussed med effects and SE's. Routine screening labs and tests as requested with regular follow-up as recommended. I discussed the assessment and treatment plan as above with the patient. The patient was provided an opportunity to ask questions and all were answered. The patient agreed with the plan  and demonstrated an understanding of the instructions.Over 40 minutes of exam, counseling, chart review and high complex critical decision making was performed  Marinus Maw, MD

## 2019-04-24 NOTE — Patient Instructions (Signed)
Coronavirus (COVID-19) Are you at risk?  Are you at risk for the Coronavirus (COVID-19)?  To be considered HIGH RISK for Coronavirus (COVID-19), you have to meet the following criteria:  . Traveled to [place], [place], [place], [place] or [place]; or in the [place] to [place], [place], [place]  . or [place]; and have fever, cough, and shortness of breath within the last 2 weeks of travel OR . Been in close contact with a person diagnosed with COVID-19 within the last 2 weeks and have  . fever, cough,and shortness of breath .  . IF YOU DO NOT MEET THESE CRITERIA, YOU ARE CONSIDERED LOW RISK FOR COVID-19.  What to do if you are HIGH RISK for COVID-19?  . If you are having a medical emergency, call 911. . Seek medical care right away. Before you go to a doctor's office, urgent care or emergency department, .  call ahead and tell them about your recent travel, contact with someone diagnosed with COVID-19  .  and your symptoms.  . You should receive instructions from your physician's office regarding next steps of care.  . When you arrive at healthcare provider, tell the healthcare staff immediately you have returned from  . visiting [place], [place], [place], [place] or [place]; or traveled in the [place] to [place], [place],  . [place] or [place] in the last two weeks or you have been in close contact with a person diagnosed with  . COVID-19 in the last 2 weeks.   . Tell the health care staff about your symptoms: fever, cough and shortness of breath. . After you have been seen by a medical provider, you will be either: o Tested for (COVID-19) and discharged home on quarantine except to seek medical care if  o symptoms worsen, and asked to  - Stay home and avoid contact with others until you get your results (4-5 days)  - Avoid travel on public transportation if possible (such as bus, train, or airplane) or o Sent to the Emergency Department by EMS for evaluation,  COVID-19 testing  and  o possible admission depending on your condition and test results.  What to do if you are LOW RISK for COVID-19?  Reduce your risk of any infection by using the same precautions used for avoiding the common cold or flu:  . Wash your hands often with soap and warm water for at least 20 seconds.  If soap and water are not readily available,  . use an alcohol-based hand sanitizer with at least 60% alcohol.  . If coughing or sneezing, cover your mouth and nose by coughing or sneezing into the elbow areas of your shirt or coat, .  into a tissue or into your sleeve (not your hands). . Avoid shaking hands with others and consider head nods or verbal greetings only. . Avoid touching your eyes, nose, or mouth with unwashed hands.  . Avoid close contact with people who are sick. . Avoid places or events with large numbers of people in one location, like concerts or sporting events. . Carefully consider travel plans you have or are making. . If you are planning any travel outside or inside the [place], visit the CDC's Travelers' Health webpage for the latest health notices. . If you have some symptoms but not all symptoms, continue to monitor at home and seek medical attention  . if your symptoms worsen. . If you are having a medical emergency, call 911. >>>>>>>>>>>>>>>>>>>>>>>>>>>>   Preventive Care for Adults  A healthy lifestyle and preventive care can promote health and wellness. Preventive health guidelines for men include the following key practices:  A routine yearly physical is a good way to check with your health care provider about your health and preventative screening. It is a chance to share any concerns and updates on your health and to receive a thorough exam.  Visit your dentist for a routine exam and preventative care every 6 months. Brush your teeth twice a day and floss once a day. Good oral hygiene prevents tooth decay and gum disease.  The frequency of eye exams  is based on your age, health, family medical history, use of contact lenses, and other factors. Follow your health care provider's recommendations for frequency of eye exams.  Eat a healthy diet. Foods such as vegetables, fruits, whole grains, low-fat dairy products, and lean protein foods contain the nutrients you need without too many calories. Decrease your intake of foods high in solid fats, added sugars, and salt. Eat the right amount of calories for you. Get information about a proper diet from your health care provider, if necessary.  Regular physical exercise is one of the most important things you can do for your health. Most adults should get at least 150 minutes of moderate-intensity exercise (any activity that increases your heart rate and causes you to sweat) each week. In addition, most adults need muscle-strengthening exercises on 2 or more days a week.  Maintain a healthy weight. The body mass index (BMI) is a screening tool to identify possible weight problems. It provides an estimate of body fat based on height and weight. Your health care provider can find your BMI and can help you achieve or maintain a healthy weight. For adults 20 years and older:  A BMI below 18.5 is considered underweight.  A BMI of 18.5 to 24.9 is normal.  A BMI of 25 to 29.9 is considered overweight.  A BMI of 30 and above is considered obese.  Maintain normal blood lipids and cholesterol levels by exercising and minimizing your intake of saturated fat. Eat a balanced diet with plenty of fruit and vegetables. Blood tests for lipids and cholesterol should begin at age 20 and be repeated every 5 years. If your lipid or cholesterol levels are high, you are over 50, or you are at high risk for heart disease, you may need your cholesterol levels checked more frequently. Ongoing high lipid and cholesterol levels should be treated with medicines if diet and exercise are not working.  If you smoke, find out from  your health care provider how to quit. If you do not use tobacco, do not start.  Lung cancer screening is recommended for adults aged 55-80 years who are at high risk for developing lung cancer because of a history of smoking. A yearly low-dose CT scan of the lungs is recommended for people who have at least a 30-pack-year history of smoking and are a current smoker or have quit within the past 15 years. A pack year of smoking is smoking an average of 1 pack of cigarettes a day for 1 year (for example: 1 pack a day for 30 years or 2 packs a day for 15 years). Yearly screening should continue until the smoker has stopped smoking for at least 15 years. Yearly screening should be stopped for people who develop a health problem that would prevent them from having lung cancer treatment.  If you choose to drink alcohol,   do not have more than 2 drinks per day. One drink is considered to be 12 ounces (355 mL) of beer, 5 ounces (148 mL) of wine, or 1.5 ounces (44 mL) of liquor.  Avoid use of street drugs. Do not share needles with anyone. Ask for help if you need support or instructions about stopping the use of drugs.  High blood pressure causes heart disease and increases the risk of stroke. Your blood pressure should be checked at least every 1-2 years. Ongoing high blood pressure should be treated with medicines, if weight loss and exercise are not effective.  If you are 45-79 years old, ask your health care provider if you should take aspirin to prevent heart disease.  Diabetes screening involves taking a blood sample to check your fasting blood sugar level. This should be done once every 3 years, after age 45, if you are within normal weight and without risk factors for diabetes. Testing should be considered at a younger age or be carried out more frequently if you are overweight and have at least 1 risk factor for diabetes.  Colorectal cancer can be detected and often prevented. Most routine colorectal  cancer screening begins at the age of 50 and continues through age 75. However, your health care provider may recommend screening at an earlier age if you have risk factors for colon cancer. On a yearly basis, your health care provider may provide home test kits to check for hidden blood in the stool. Use of a small camera at the end of a tube to directly examine the colon (sigmoidoscopy or colonoscopy) can detect the earliest forms of colorectal cancer. Talk to your health care provider about this at age 50, when routine screening begins. Direct exam of the colon should be repeated every 5-10 years through age 75, unless early forms of precancerous polyps or small growths are found.   Talk with your health care provider about prostate cancer screening.  Testicular cancer screening isrecommended for adult males. Screening includes self-exam, a health care provider exam, and other screening tests. Consult with your health care provider about any symptoms you have or any concerns you have about testicular cancer.  Use sunscreen. Apply sunscreen liberally and repeatedly throughout the day. You should seek shade when your shadow is shorter than you. Protect yourself by wearing long sleeves, pants, a wide-brimmed hat, and sunglasses year round, whenever you are outdoors.  Once a month, do a whole-body skin exam, using a mirror to look at the skin on your back. Tell your health care provider about new moles, moles that have irregular borders, moles that are larger than a pencil eraser, or moles that have changed in shape or color.  Stay current with required vaccines (immunizations).  Influenza vaccine. All adults should be immunized every year.  Tetanus, diphtheria, and acellular pertussis (Td, Tdap) vaccine. An adult who has not previously received Tdap or who does not know his vaccine status should receive 1 dose of Tdap. This initial dose should be followed by tetanus and diphtheria toxoids (Td) booster  doses every 10 years. Adults with an unknown or incomplete history of completing a 3-dose immunization series with Td-containing vaccines should begin or complete a primary immunization series including a Tdap dose. Adults should receive a Td booster every 10 years.  Varicella vaccine. An adult without evidence of immunity to varicella should receive 2 doses or a second dose if he has previously received 1 dose.  Human papillomavirus (HPV) vaccine. Males aged 13-21   years who have not received the vaccine previously should receive the 3-dose series. Males aged 22-26 years may be immunized. Immunization is recommended through the age of 26 years for any male who has sex with males and did not get any or all doses earlier. Immunization is recommended for any person with an immunocompromised condition through the age of 26 years if he did not get any or all doses earlier. During the 3-dose series, the second dose should be obtained 4-8 weeks after the first dose. The third dose should be obtained 24 weeks after the first dose and 16 weeks after the second dose.  Zoster vaccine. One dose is recommended for adults aged 60 years or older unless certain conditions are present.    PREVNAR  - Pneumococcal 13-valent conjugate (PCV13) vaccine. When indicated, a person who is uncertain of his immunization history and has no record of immunization should receive the PCV13 vaccine. An adult aged 19 years or older who has certain medical conditions and has not been previously immunized should receive 1 dose of PCV13 vaccine. This PCV13 should be followed with a dose of pneumococcal polysaccharide (PPSV23) vaccine. The PPSV23 vaccine dose should be obtained at least 1 r more year(s) after the dose of PCV13 vaccine. An adult aged 19 years or older who has certain medical conditions and previously received 1 or more doses of PPSV23 vaccine should receive 1 dose of PCV13. The PCV13 vaccine dose should be obtained 1 or more  years after the last PPSV23 vaccine dose.    PNEUMOVAX - Pneumococcal polysaccharide (PPSV23) vaccine. When PCV13 is also indicated, PCV13 should be obtained first. All adults aged 65 years and older should be immunized. An adult younger than age 65 years who has certain medical conditions should be immunized. Any person who resides in a nursing home or long-term care facility should be immunized. An adult smoker should be immunized. People with an immunocompromised condition and certain other conditions should receive both PCV13 and PPSV23 vaccines. People with human immunodeficiency virus (HIV) infection should be immunized as soon as possible after diagnosis. Immunization during chemotherapy or radiation therapy should be avoided. Routine use of PPSV23 vaccine is not recommended for American Indians, [place] Natives, or people younger than 65 years unless there are medical conditions that require PPSV23 vaccine. When indicated, people who have unknown immunization and have no record of immunization should receive PPSV23 vaccine. One-time revaccination 5 years after the first dose of PPSV23 is recommended for people aged 19-64 years who have chronic kidney failure, nephrotic syndrome, asplenia, or immunocompromised conditions. People who received 1-2 doses of PPSV23 before age 65 years should receive another dose of PPSV23 vaccine at age 65 years or later if at least 5 years have passed since the previous dose. Doses of PPSV23 are not needed for people immunized with PPSV23 at or after age 65 years.    Hepatitis A vaccine. Adults who wish to be protected from this disease, have certain high-risk conditions, work with hepatitis A-infected animals, work in hepatitis A research labs, or travel to or work in countries with a high rate of hepatitis A should be immunized. Adults who were previously unvaccinated and who anticipate close contact with an international adoptee during the first 60 days after arrival  in the [place] from a country with a high rate of hepatitis A should be immunized.    Hepatitis B vaccine. Adults should be immunized if they wish to be protected from this disease, have certain   high-risk conditions, may be exposed to blood or other infectious body fluids, are household contacts or sex partners of hepatitis B positive people, are clients or workers in certain care facilities, or travel to or work in countries with a high rate of hepatitis B.   Preventive Service / Frequency   Ages 40 to 64  Blood pressure check.  Lipid and cholesterol check  Lung cancer screening. / Every year if you are aged 55-80 years and have a 30-pack-year history of smoking and currently smoke or have quit within the past 15 years. Yearly screening is stopped once you have quit smoking for at least 15 years or develop a health problem that would prevent you from having lung cancer treatment.  Fecal occult blood test (FOBT) of stool. / Every year beginning at age 50 and continuing until age 75. You may not have to do this test if you get a colonoscopy every 10 years.  Flexible sigmoidoscopy** or colonoscopy.** / Every 5 years for a flexible sigmoidoscopy or every 10 years for a colonoscopy beginning at age 50 and continuing until age 75. Screening for abdominal aortic aneurysm (AAA)  by ultrasound is recommended for people who have history of high blood pressure or who are current or former smokers. +++++++++++ Recommend Adult Low Dose Aspirin or  coated  Aspirin 81 mg daily  To reduce risk of Colon Cancer 20 %,  Skin Cancer 26 % ,  Malignant Melanoma 46%  and  Pancreatic cancer 60% ++++++++++++++++++++ Vitamin D goal  is between 70-100.  Please make sure that you are taking your Vitamin D as directed.  It is very important as a natural anti-inflammatory  helping hair, skin, and nails, as well as reducing stroke and heart attack risk.  It helps your bones and helps with mood. It also  decreases numerous cancer risks so please take it as directed.  Low Vit D is associated with a 200-300% higher risk for CANCER  and 200-300% higher risk for HEART   ATTACK  &  STROKE.   ...................................... It is also associated with higher death rate at younger ages,  autoimmune diseases like Rheumatoid arthritis, Lupus, Multiple Sclerosis.    Also many other serious conditions, like depression, Alzheimer's Dementia, infertility, muscle aches, fatigue, fibromyalgia - just to name a few. +++++++++++++++++++++ Recommend the book "The END of DIETING" by Dr Joel Fuhrman  & the book "The END of DIABETES " by Dr Joel Fuhrman At [employer] - get book & Audio CD's    Being diabetic has a  300% increased risk for heart attack, stroke, cancer, and alzheimer- type vascular dementia. It is very important that you work harder with diet by avoiding all foods that are white. Avoid white rice (brown & wild rice is OK), white potatoes (sweetpotatoes in moderation is OK), White bread or wheat bread or anything made out of white flour like bagels, donuts, rolls, buns, biscuits, cakes, pastries, cookies, pizza crust, and pasta (made from white flour & egg whites) - vegetarian pasta or spinach or wheat pasta is OK. Multigrain breads like Arnold's or [employer], or multigrain sandwich thins or flatbreads.  Diet, exercise and weight loss can reverse and cure diabetes in the early stages.  Diet, exercise and weight loss is very important in the control and prevention of complications of diabetes which affects every system in your body, ie. Brain - dementia/stroke, eyes - glaucoma/blindness, heart - heart attack/heart failure, kidneys - dialysis, stomach - gastric paralysis, intestines - malabsorption,   nerves - severe painful neuritis, circulation - gangrene & loss of a leg(s), and finally cancer and Alzheimers.    I recommend avoid fried & greasy foods,  sweets/candy, white rice (brown or wild rice or  Quinoa is OK), white potatoes (sweet potatoes are OK) - anything made from white flour - bagels, doughnuts, rolls, buns, biscuits,white and wheat breads, pizza crust and traditional pasta made of white flour & egg white(vegetarian pasta or spinach or wheat pasta is OK).  Multi-grain bread is OK - like multi-grain flat bread or sandwich thins. Avoid alcohol in excess. Exercise is also important.    Eat all the vegetables you want - avoid meat, especially red meat and dairy - especially cheese.  Cheese is the most concentrated form of trans-fats which is the worst thing to clog up our arteries. Veggie cheese is OK which can be found in the fresh produce section at [employer] or [employer] or [employer]  ++++++++++++++++++++++ DASH Eating Plan  DASH stands for "Dietary Approaches to Stop Hypertension."   The DASH eating plan is a healthy eating plan that has been shown to reduce high blood pressure (hypertension). Additional health benefits may include reducing the risk of type 2 diabetes mellitus, heart disease, and stroke. The DASH eating plan may also help with weight loss. WHAT DO I NEED TO KNOW ABOUT THE DASH EATING PLAN? For the DASH eating plan, you will follow these general guidelines:  Choose foods with a percent daily value for sodium of less than 5% (as listed on the food label).  Use salt-free seasonings or herbs instead of table salt or sea salt.  Check with your health care provider or pharmacist before using salt substitutes.  Eat lower-sodium products, often labeled as "lower sodium" or "no salt added."  Eat fresh foods.  Eat more vegetables, fruits, and low-fat dairy products.  Choose whole grains. Look for the word "whole" as the first word in the ingredient list.  Choose fish   Limit sweets, desserts, sugars, and sugary drinks.  Choose heart-healthy fats.  Eat veggie cheese   Eat more home-cooked food and less restaurant, buffet, and fast food.  Limit fried  foods.  Cook foods using methods other than frying.  Limit canned vegetables. If you do use them, rinse them well to decrease the sodium.  When eating at a restaurant, ask that your food be prepared with less salt, or no salt if possible.                      WHAT FOODS CAN I EAT? Read Dr Joel Fuhrman's books on The End of Dieting & The End of Diabetes  Grains Whole grain or whole wheat bread. Brown rice. Whole grain or whole wheat pasta. Quinoa, bulgur, and whole grain cereals. Low-sodium cereals. Corn or whole wheat flour tortillas. Whole grain cornbread. Whole grain crackers. Low-sodium crackers.  Vegetables Fresh or frozen vegetables (raw, steamed, roasted, or grilled). Low-sodium or reduced-sodium tomato and vegetable juices. Low-sodium or reduced-sodium tomato sauce and paste. Low-sodium or reduced-sodium canned vegetables.   Fruits All fresh, canned (in natural juice), or frozen fruits.  Protein Products  All fish and seafood.  Dried beans, peas, or lentils. Unsalted nuts and seeds. Unsalted canned beans.  Dairy Low-fat dairy products, such as skim or 1% milk, 2% or reduced-fat cheeses, low-fat ricotta or cottage cheese, or plain low-fat yogurt. Low-sodium or reduced-sodium cheeses.  Fats and Oils Tub margarines without trans fats. Light or reduced-fat mayonnaise   and salad dressings (reduced sodium). Avocado. Safflower, olive, or canola oils. Natural peanut or almond butter.  Other Unsalted popcorn and pretzels. The items listed above may not be a complete list of recommended foods or beverages. Contact your dietitian for more options.  +++++++++++++++++++  WHAT FOODS ARE NOT RECOMMENDED? Grains/ White flour or wheat flour White bread. White pasta. White rice. Refined cornbread. Bagels and croissants. Crackers that contain trans fat.  Vegetables  Creamed or fried vegetables. Vegetables in a . Regular canned vegetables. Regular canned tomato sauce and paste. Regular  tomato and vegetable juices.  Fruits Dried fruits. Canned fruit in light or heavy syrup. Fruit juice.  Meat and Other Protein Products Meat in general - RED meat & White meat.  Fatty cuts of meat. Ribs, chicken wings, all processed meats as bacon, sausage, bologna, salami, fatback, hot dogs, bratwurst and packaged luncheon meats.  Dairy Whole or 2% milk, cream, half-and-half, and cream cheese. Whole-fat or sweetened yogurt. Full-fat cheeses or blue cheese. Non-dairy creamers and whipped toppings. Processed cheese, cheese spreads, or cheese curds.  Condiments Onion and garlic salt, seasoned salt, table salt, and sea salt. Canned and packaged gravies. Worcestershire sauce. Tartar sauce. Barbecue sauce. Teriyaki sauce. Soy sauce, including reduced sodium. Steak sauce. Fish sauce. Oyster sauce. Cocktail sauce. Horseradish. Ketchup and mustard. Meat flavorings and tenderizers. Bouillon cubes. Hot sauce. Tabasco sauce. Marinades. Taco seasonings. Relishes.  Fats and Oils Butter, stick margarine, lard, shortening and bacon fat. Coconut, palm kernel, or palm oils. Regular salad dressings.  Pickles and olives. Salted popcorn and pretzels.  The items listed above may not be a complete list of foods and beverages to avoid.    

## 2019-04-25 ENCOUNTER — Ambulatory Visit: Payer: BC Managed Care – PPO | Admitting: Internal Medicine

## 2019-04-25 ENCOUNTER — Other Ambulatory Visit: Payer: Self-pay

## 2019-04-25 VITALS — BP 126/84 | HR 64 | Temp 97.3°F | Resp 16 | Ht 71.0 in | Wt 272.4 lb

## 2019-04-25 DIAGNOSIS — E559 Vitamin D deficiency, unspecified: Secondary | ICD-10-CM

## 2019-04-25 DIAGNOSIS — E782 Mixed hyperlipidemia: Secondary | ICD-10-CM

## 2019-04-25 DIAGNOSIS — Z13220 Encounter for screening for lipoid disorders: Secondary | ICD-10-CM

## 2019-04-25 DIAGNOSIS — I10 Essential (primary) hypertension: Secondary | ICD-10-CM

## 2019-04-25 DIAGNOSIS — R350 Frequency of micturition: Secondary | ICD-10-CM | POA: Diagnosis not present

## 2019-04-25 DIAGNOSIS — Z1329 Encounter for screening for other suspected endocrine disorder: Secondary | ICD-10-CM

## 2019-04-25 DIAGNOSIS — R7303 Prediabetes: Secondary | ICD-10-CM | POA: Diagnosis not present

## 2019-04-25 DIAGNOSIS — Z136 Encounter for screening for cardiovascular disorders: Secondary | ICD-10-CM | POA: Diagnosis not present

## 2019-04-25 DIAGNOSIS — Z0000 Encounter for general adult medical examination without abnormal findings: Secondary | ICD-10-CM | POA: Diagnosis not present

## 2019-04-25 DIAGNOSIS — Z1212 Encounter for screening for malignant neoplasm of rectum: Secondary | ICD-10-CM

## 2019-04-25 DIAGNOSIS — Z0001 Encounter for general adult medical examination with abnormal findings: Secondary | ICD-10-CM

## 2019-04-25 DIAGNOSIS — Z131 Encounter for screening for diabetes mellitus: Secondary | ICD-10-CM

## 2019-04-25 DIAGNOSIS — N401 Enlarged prostate with lower urinary tract symptoms: Secondary | ICD-10-CM | POA: Diagnosis not present

## 2019-04-25 DIAGNOSIS — Z125 Encounter for screening for malignant neoplasm of prostate: Secondary | ICD-10-CM

## 2019-04-25 DIAGNOSIS — Z1389 Encounter for screening for other disorder: Secondary | ICD-10-CM | POA: Diagnosis not present

## 2019-04-25 DIAGNOSIS — R7309 Other abnormal glucose: Secondary | ICD-10-CM

## 2019-04-25 DIAGNOSIS — Z79899 Other long term (current) drug therapy: Secondary | ICD-10-CM

## 2019-04-25 DIAGNOSIS — Z87891 Personal history of nicotine dependence: Secondary | ICD-10-CM

## 2019-04-25 DIAGNOSIS — Z111 Encounter for screening for respiratory tuberculosis: Secondary | ICD-10-CM

## 2019-04-25 DIAGNOSIS — E291 Testicular hypofunction: Secondary | ICD-10-CM

## 2019-04-25 DIAGNOSIS — Z9989 Dependence on other enabling machines and devices: Secondary | ICD-10-CM

## 2019-04-25 DIAGNOSIS — R5383 Other fatigue: Secondary | ICD-10-CM

## 2019-04-25 DIAGNOSIS — Z130 Encounter for screening for diseases of the blood and blood-forming organs and certain disorders involving the immune mechanism: Secondary | ICD-10-CM

## 2019-04-25 DIAGNOSIS — Z8249 Family history of ischemic heart disease and other diseases of the circulatory system: Secondary | ICD-10-CM

## 2019-04-25 DIAGNOSIS — G4733 Obstructive sleep apnea (adult) (pediatric): Secondary | ICD-10-CM

## 2019-04-25 DIAGNOSIS — N138 Other obstructive and reflux uropathy: Secondary | ICD-10-CM

## 2019-04-25 DIAGNOSIS — Z1211 Encounter for screening for malignant neoplasm of colon: Secondary | ICD-10-CM

## 2019-04-26 LAB — LIPID PANEL
Cholesterol: 142 mg/dL (ref <200)
HDL: 40 mg/dL (ref >=40)
LDL Cholesterol (Calc): 81 mg/dL (calc)
Non-HDL Cholesterol (Calc): 102 mg/dL (calc) (ref <130)
Total CHOL/HDL Ratio: 3.6 (calc) (ref <5.0)
Triglycerides: 113 mg/dL (ref <150)

## 2019-04-26 LAB — IRON, TOTAL/TOTAL IRON BINDING CAP
%SAT: 35 % (calc) (ref 20–48)
Iron: 106 ug/dL (ref 50–180)
TIBC: 300 mcg/dL (calc) (ref 250–425)

## 2019-04-26 LAB — HEMOGLOBIN A1C
Hgb A1c MFr Bld: 5.5 % of total Hgb (ref <5.7)
Mean Plasma Glucose: 111 (calc)
eAG (mmol/L): 6.2 (calc)

## 2019-04-26 LAB — CBC WITH DIFFERENTIAL/PLATELET
Absolute Monocytes: 632 cells/uL (ref 200–950)
Basophils Absolute: 68 cells/uL (ref 0–200)
Basophils Relative: 1.1 %
Eosinophils Absolute: 384 cells/uL (ref 15–500)
Eosinophils Relative: 6.2 %
HCT: 44.9 % (ref 38.5–50.0)
Hemoglobin: 15.3 g/dL (ref 13.2–17.1)
Lymphs Abs: 1631 cells/uL (ref 850–3900)
MCH: 32 pg (ref 27.0–33.0)
MCHC: 34.1 g/dL (ref 32.0–36.0)
MCV: 93.9 fL (ref 80.0–100.0)
MPV: 11 fL (ref 7.5–12.5)
Monocytes Relative: 10.2 %
Neutro Abs: 3484 cells/uL (ref 1500–7800)
Neutrophils Relative %: 56.2 %
Platelets: 70 10*3/uL — ABNORMAL LOW (ref 140–400)
RBC: 4.78 10*6/uL (ref 4.20–5.80)
RDW: 13.1 % (ref 11.0–15.0)
Total Lymphocyte: 26.3 %
WBC: 6.2 10*3/uL (ref 3.8–10.8)

## 2019-04-26 LAB — COMPLETE METABOLIC PANEL WITH GFR
AG Ratio: 2 (calc) (ref 1.0–2.5)
ALT: 39 U/L (ref 9–46)
AST: 25 U/L (ref 10–40)
Albumin: 4.3 g/dL (ref 3.6–5.1)
Alkaline phosphatase (APISO): 63 U/L (ref 36–130)
BUN: 11 mg/dL (ref 7–25)
CO2: 30 mmol/L (ref 20–32)
Calcium: 9.5 mg/dL (ref 8.6–10.3)
Chloride: 103 mmol/L (ref 98–110)
Creat: 1.25 mg/dL (ref 0.60–1.35)
GFR, Est African American: 78 mL/min/{1.73_m2} (ref >=60)
GFR, Est Non African American: 67 mL/min/{1.73_m2} (ref >=60)
Globulin: 2.1 g/dL (calc) (ref 1.9–3.7)
Glucose, Bld: 92 mg/dL (ref 65–99)
Potassium: 4.3 mmol/L (ref 3.5–5.3)
Sodium: 140 mmol/L (ref 135–146)
Total Bilirubin: 0.6 mg/dL (ref 0.2–1.2)
Total Protein: 6.4 g/dL (ref 6.1–8.1)

## 2019-04-26 LAB — URINALYSIS, ROUTINE W REFLEX MICROSCOPIC
Bacteria, UA: NONE SEEN /HPF
Bilirubin Urine: NEGATIVE
Glucose, UA: NEGATIVE
Hgb urine dipstick: NEGATIVE
Hyaline Cast: NONE SEEN /LPF
Ketones, ur: NEGATIVE
Nitrite: NEGATIVE
Protein, ur: NEGATIVE
RBC / HPF: NONE SEEN /HPF (ref 0–2)
Specific Gravity, Urine: 1.019 (ref 1.001–1.03)
Squamous Epithelial / HPF: NONE SEEN /HPF (ref <=5)
WBC, UA: NONE SEEN /HPF (ref 0–5)
pH: <=5 (ref 5.0–8.0)

## 2019-04-26 LAB — MAGNESIUM: Magnesium: 2 mg/dL (ref 1.5–2.5)

## 2019-04-26 LAB — MICROALBUMIN / CREATININE URINE RATIO
Creatinine, Urine: 155 mg/dL (ref 20–320)
Microalb Creat Ratio: 1 mcg/mg creat (ref <30)
Microalb, Ur: 0.2 mg/dL

## 2019-04-26 LAB — TSH: TSH: 2.09 mIU/L (ref 0.40–4.50)

## 2019-04-26 LAB — VITAMIN D 25 HYDROXY (VIT D DEFICIENCY, FRACTURES): Vit D, 25-Hydroxy: 84 ng/mL (ref 30–100)

## 2019-04-26 LAB — PSA: PSA: 0.7 ng/mL (ref <=4.0)

## 2019-04-26 LAB — VITAMIN B12: Vitamin B-12: 425 pg/mL (ref 200–1100)

## 2019-04-26 LAB — INSULIN, RANDOM: Insulin: 21.3 u[IU]/mL — ABNORMAL HIGH

## 2019-04-26 LAB — TESTOSTERONE: Testosterone: 552 ng/dL (ref 250–827)

## 2019-05-09 ENCOUNTER — Other Ambulatory Visit: Payer: Self-pay | Admitting: Physician Assistant

## 2019-05-09 DIAGNOSIS — Z79899 Other long term (current) drug therapy: Secondary | ICD-10-CM

## 2019-05-17 ENCOUNTER — Other Ambulatory Visit: Payer: Self-pay | Admitting: Adult Health

## 2019-06-09 ENCOUNTER — Other Ambulatory Visit: Payer: Self-pay | Admitting: *Deleted

## 2019-06-09 ENCOUNTER — Other Ambulatory Visit: Payer: Self-pay | Admitting: Internal Medicine

## 2019-06-14 ENCOUNTER — Other Ambulatory Visit: Payer: Self-pay | Admitting: Internal Medicine

## 2019-06-14 DIAGNOSIS — F320 Major depressive disorder, single episode, mild: Secondary | ICD-10-CM

## 2019-07-05 ENCOUNTER — Other Ambulatory Visit: Payer: Self-pay

## 2019-07-05 ENCOUNTER — Other Ambulatory Visit: Payer: Self-pay | Admitting: Adult Health Nurse Practitioner

## 2019-07-05 ENCOUNTER — Encounter: Payer: Self-pay | Admitting: Adult Health Nurse Practitioner

## 2019-07-05 ENCOUNTER — Ambulatory Visit: Payer: BC Managed Care – PPO | Admitting: Adult Health Nurse Practitioner

## 2019-07-05 VITALS — BP 118/80 | HR 64 | Temp 97.2°F | Resp 18 | Ht 71.0 in | Wt 268.8 lb

## 2019-07-05 DIAGNOSIS — G4733 Obstructive sleep apnea (adult) (pediatric): Secondary | ICD-10-CM | POA: Diagnosis not present

## 2019-07-05 DIAGNOSIS — R220 Localized swelling, mass and lump, head: Secondary | ICD-10-CM

## 2019-07-05 DIAGNOSIS — I10 Essential (primary) hypertension: Secondary | ICD-10-CM | POA: Diagnosis not present

## 2019-07-05 DIAGNOSIS — J309 Allergic rhinitis, unspecified: Secondary | ICD-10-CM | POA: Diagnosis not present

## 2019-07-05 DIAGNOSIS — Z9989 Dependence on other enabling machines and devices: Secondary | ICD-10-CM

## 2019-07-05 NOTE — Progress Notes (Signed)
Assessment and Plan:  Shane Saunders was seen today for oral swelling.  Diagnoses and all orders for this visit:  Localized swelling, mass, and lump of head -     CT Maxillofacial W/Cm; Future -     CBC with Differential/Platelet -     COMPLETE METABOLIC PANEL WITH GFR Further treatment after imaging  Allergic rhinitis, unspecified seasonality, unspecified trigger -Continue current regiment Allegra & fluticasone Continue to monitor symptoms and triggers  Essential hypertension Hypertension Continue medication: Monitor blood pressure at home; call if consistently over 130/80 Continue DASH diet.   Reminder to go to the ER if any CP, SOB, nausea, dizziness, severe HA, changes vision/speech, left arm numbness and tingling and jaw pain.  OSA on CPAP Discussed mask hygeine, regular cleanings of both mask and tubing Ensure fitting properly, too tight? Helping with daytime fatigue, weight loss still advised.     Further disposition pending results of labs. Discussed med's effects and SE's.   Over 30 minutes of exam, counseling, chart review, and critical decision making was performed.   Future Appointments  Date Time Provider Shane Saunders  07/28/2019  4:30 PM Liane Comber, NP [facility] None  11/01/2019  4:30 PM Unk Pinto, MD [facility] None  05/07/2020  2:00 PM Unk Pinto, MD [facility] None    ------------------------------------------------------------------------------------------------------------------   HPI 48 y.o.male presents for swelling in his right cheek and upper lip that started to increase three days ago.  Reports that this has been an ongoing recurrent issue for him.  This is the fourth episode.  He denies any pain, open lesions, drainage, discomfort with chewing or difficulties swallowing.  He reports that he started taking benadryl for this and feels like it has not made a difference.  The first episode 01/24/2018 and he went to ER and followed up with  our office the next day.  He had resolution after treatment with antibiotics and prednisone.  The other two episodes resolved after a few days with conservative treatments.  This flare he reports that the area that is swelling becomes larger with each episode. He has completed a referral with [facility].  There was modifications to his allergy regiment including taking allegra and fluticasone.  Reports the skin prick test reveal allergy to multiple environmental plant/tree allergies.  He is unable to associate this edema with any dietary or environmental exposures.  He has sleep apnea and wears a CPAP mask nightly.  Reports he clean the tubing every two weeks and his mask monthly.  Reports that the mask fits well and he does not have any difficulties with the fit.    Past Medical History:  Diagnosis Date  . Abnormal glucose   . Borderline hypertension   . BPH (benign prostatic hypertrophy)   . Crushing injury of left elbow and forearm 07/13/2015  . Depression, major, in partial remission ([facility]) 07/12/2015  . Hyperlipidemia   . Hypertension   . Hypogonadism male   . 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 Morbid obesity ([facility])   . Obesity (BMI 30-39.9)   . OSA on CPAP   . Prediabetes   . Right ureteral stone   . Sleep apnea    C PAP  . Vitamin D deficiency   . Wears glasses      Allergies  Allergen Reactions  . Codeine Other (See Comments)    "can's sleep"  . Effexor [Venlafaxine] Other (See Comments)    ED  .  Prednisone Other (See Comments)    "can't sleep"  . Tramadol Hcl Itching  . Zinc Other (See Comments)    "stomache upset"    Current Outpatient Medications on File Prior to Visit  Medication Sig  . ALPRAZolam (XANAX) 1 MG tablet TAKE 1/2 TO 1 TAB BY MOUTH 2TO3 TIMES A DAY AS NEEDED FORPANIC/ANXIETY ATTACKS. LIMIT 5DAYS PER WEEK  . atorvastatin (LIPITOR) 80 MG tablet TAKE 1 TABLET BY MOUTH EVERY DAY  . buPROPion (WELLBUTRIN XL)  300 MG 24 hr tablet TAKE 1 TABLET BY MOUTH EVERY DAY IN THE MORNING  . Cholecalciferol (VITAMIN D3) 2000 units TABS Take 10,000 tablets by mouth daily.   . DULoxetine (CYMBALTA) 60 MG capsule TAKE 1 CAPSULE DAILY FOR MOOD & ANXIETY  . finasteride (PROSCAR) 5 MG tablet Take 1 tablet (5 mg total) by mouth daily. For prostate.  . silodosin (RAPAFLO) 8 MG CAPS capsule Take 1 capsule at night for Prostate   No current facility-administered medications on file prior to visit.     ROS: all negative except above.   Physical Exam:  BP 118/80   Pulse 64   Temp (!) 97.2 F (36.2 C)   Resp 18   Ht 5\' 11"  (1.803 m)   Wt 268 lb 12.8 oz (121.9 kg)   BMI 37.49 kg/m   General Appearance: Well nourished, in no apparent distress. Eyes: PERRLA, EOMs, conjunctiva no swelling or erythema Sinuses: No Frontal/maxillary tenderness ENT/Mouth: Ext aud canals clear, TMs without erythema, bulging. No erythema, swelling, or exudate on post pharynx.  Tonsils not swollen or erythematous. Hearing normal. Edema noted just lateral to right nasolabial fold.  Localized to this area, no erythema or tenderness.  Area is solid, non-moveable with palpation from inside buccal mucosa.  No open lesions. Neck: Supple, thyroid normal.  Respiratory: Respiratory effort normal, BS equal bilaterally without rales, rhonchi, wheezing or stridor.  Cardio: RRR with no MRGs. Brisk peripheral pulses without edema.  Abdomen: Soft, + BS.  Non tender, no guarding, rebound, hernias, masses. Lymphatics: Non tender without lymphadenopathy.  Musculoskeletal: Full ROM, 5/5 strength, normal gait.  Skin: Warm, dry without rashes, lesions, ecchymosis.  Neuro: Cranial nerves intact. Normal muscle tone, no cerebellar symptoms. Sensation intact.  Psych: Awake and oriented X 3, normal affect, Insight and Judgment appropriate.     Elder NegusKyra Sonny Poth, NP 11:25 AM [facility]

## 2019-07-05 NOTE — Patient Instructions (Addendum)
We are going to place an  Order for CT scan to check for stones or blockage in your parotid gland vs. Cellulitis.  Based on these results we pursue further treatment and or referral to ENT.   Be sure that you are cleaning you CPAP mask and tubing regularly.    Please contact the office if you have any new or worsening symptoms

## 2019-07-06 LAB — CBC WITH DIFFERENTIAL/PLATELET
Absolute Monocytes: 713 cells/uL (ref 200–950)
Basophils Absolute: 70 {cells}/uL (ref 0–200)
Basophils Relative: 1.3 %
Eosinophils Absolute: 329 cells/uL (ref 15–500)
Eosinophils Relative: 6.1 %
HCT: 44.9 % (ref 38.5–50.0)
Hemoglobin: 15.6 g/dL (ref 13.2–17.1)
Lymphs Abs: 1669 cells/uL (ref 850–3900)
MCH: 32.6 pg (ref 27.0–33.0)
MCHC: 34.7 g/dL (ref 32.0–36.0)
MCV: 93.7 fL (ref 80.0–100.0)
MPV: 11.3 fL (ref 7.5–12.5)
Monocytes Relative: 13.2 %
Neutro Abs: 2619 cells/uL (ref 1500–7800)
Neutrophils Relative %: 48.5 %
Platelets: 69 10*3/uL — ABNORMAL LOW (ref 140–400)
RBC: 4.79 10*6/uL (ref 4.20–5.80)
RDW: 12.9 % (ref 11.0–15.0)
Total Lymphocyte: 30.9 %
WBC: 5.4 10*3/uL (ref 3.8–10.8)

## 2019-07-06 LAB — COMPLETE METABOLIC PANEL WITH GFR
AG Ratio: 2.2 (calc) (ref 1.0–2.5)
ALT: 36 U/L (ref 9–46)
AST: 20 U/L (ref 10–40)
Albumin: 4.4 g/dL (ref 3.6–5.1)
Alkaline phosphatase (APISO): 70 U/L (ref 36–130)
BUN: 11 mg/dL (ref 7–25)
CO2: 30 mmol/L (ref 20–32)
Calcium: 9.5 mg/dL (ref 8.6–10.3)
Chloride: 104 mmol/L (ref 98–110)
Creat: 1.18 mg/dL (ref 0.60–1.35)
GFR, Est African American: 83 mL/min/{1.73_m2} (ref >=60)
GFR, Est Non African American: 72 mL/min/{1.73_m2} (ref >=60)
Globulin: 2 g/dL (calc) (ref 1.9–3.7)
Glucose, Bld: 91 mg/dL (ref 65–99)
Potassium: 4.4 mmol/L (ref 3.5–5.3)
Sodium: 140 mmol/L (ref 135–146)
Total Bilirubin: 0.9 mg/dL (ref 0.2–1.2)
Total Protein: 6.4 g/dL (ref 6.1–8.1)

## 2019-07-14 ENCOUNTER — Other Ambulatory Visit: Payer: Self-pay | Admitting: Adult Health

## 2019-07-18 ENCOUNTER — Ambulatory Visit
Admission: RE | Admit: 2019-07-18 | Discharge: 2019-07-18 | Disposition: A | Discharge status: Home / Self Care | Payer: BC Managed Care – PPO | Source: Ambulatory Visit | Attending: Adult Health Nurse Practitioner | Admitting: Adult Health Nurse Practitioner

## 2019-07-18 ENCOUNTER — Other Ambulatory Visit: Payer: Self-pay

## 2019-07-18 DIAGNOSIS — R220 Localized swelling, mass and lump, head: Secondary | ICD-10-CM

## 2019-07-18 MED ORDER — IOPAMIDOL (ISOVUE-300) INJECTION 61%
75.0000 mL | Freq: Once | INTRAVENOUS | Status: AC | PRN
  Administered 2019-07-18: 75 mL via INTRAVENOUS

## 2019-07-21 ENCOUNTER — Other Ambulatory Visit: Payer: Self-pay | Admitting: Adult Health

## 2019-07-27 DIAGNOSIS — N138 Other obstructive and reflux uropathy: Secondary | ICD-10-CM | POA: Insufficient documentation

## 2019-07-27 NOTE — Progress Notes (Signed)
FOLLOW UP  Assessment and Plan:   Hypertension  Currently well controlled off of medications Monitor blood pressure at home; patient to call if consistently greater than 130/80 Continue DASH diet.   Reminder to go to the ER if any CP, SOB, nausea, dizziness, severe HA, changes vision/speech, left arm numbness and tingling and jaw pain.  Cholesterol Continue medication  Continue low cholesterol diet and exercise.  Check lipid panel.   Prediabetes/abnormal glucose Recent A1Cs at goal Discussed diet/exercise, weight management  Defer A1C; check CMP  Obesity with co morbidities Long discussion about weight loss, diet, and exercise Discussed ideal weight for height and initial weight goal (262 lb) Patient will work on small healthy choices - 80% good - whole wheat wrap instead of chicken biscuit, etc, start swimming again, increase water intake Will follow up in 3 months  Vitamin D Def At goal at recent check; continue to recommend supplementation for goal of 60-100 Defer vitamin D level  Recurrent major depressive disorder, in partial remission ([facility]) Continue medications; reminded to avoid daily use to benzo, limit to <5 days/ week to avoid tolerance  Lifestyle discussed: diet/exerise, sleep hygiene, stress management, hydration  BPH with LUTS Slow urine stream previously improved by tamsulosin; has seen urology, PSAs well controlled. Continue finasteride/rapaflo.   Continue diet and meds as discussed. Further disposition pending results of labs. Discussed med's effects and SE's.   Over 30 minutes of exam, counseling, chart review, and critical decision making was performed.   Future Appointments  Date Time Provider Department Center  11/01/2019  4:30 PM Lucky CowboyMcKeown, William, MD [facility] None  05/07/2020  2:00 PM Lucky CowboyMcKeown, William, MD [facility] None     ----------------------------------------------------------------------------------------------------------------------  HPI 48 y.o. male  presents for 3 month follow up on hypertension, cholesterol, prediabetes, morbid obesity and vitamin D deficiency.   he has a diagnosis of recurrent major depression with anxious features and is currently on cymbalta 60 mg daily, wellbutrin 300 mg daily, with PRN xanax, reports symptoms are well controlled on current regimen. He reports using xanax 0.5-1 mg in the evening for sleep.  BMI is Body mass index is 37.18 kg/m., he has been working on diet and exercise, he has been very active around his house remodeling, working towards weight goal of 260 lb;  Wt Readings from Last 3 Encounters:  07/28/19 266 lb 9.6 oz (120.9 kg)  07/05/19 268 lb 12.8 oz (121.9 kg)  04/25/19 272 lb 6.4 oz (123.6 kg)   Today their BP is BP: 112/74  He does not workout. He denies chest pain, shortness of breath, dizziness.   He is on cholesterol medication, taking lipitor 40 mg daily  and denies myalgias. His cholesterol is at goal. The cholesterol last visit was:   Lab Results  Component Value Date   CHOL 142 04/25/2019   HDL 40 04/25/2019   LDLCALC 81 04/25/2019   TRIG 113 04/25/2019   CHOLHDL 3.6 04/25/2019    He has not been working on diet and exercise for hx of prediabetes/abnormal glucose, and denies nausea, paresthesia of the feet, polydipsia, polyuria, visual disturbances and vomiting. Last A1C in the office was:  Lab Results  Component Value Date   HGBA1C 5.5 04/25/2019   Patient is on Vitamin D supplement and at goal at the last visit:    Lab Results  Component Value Date   VD25OH 84 04/25/2019     He is on flomax and more recently added finasteride due to slow urine stream;  Lab  Results  Component Value Date   PSA 0.7 04/25/2019   PSA 0.5 03/23/2018   PSA 0.4 02/18/2017     Current Medications:  Current Outpatient Medications on File Prior to  Visit  Medication Sig  . ALPRAZolam (XANAX) 1 MG tablet TAKE 1/2 TO 1 TABLET 2 TO 3 TIMES A DAY AS NEEDED FOR PANIC/ANXIETY ATTACKS. LIMIT 5DAYS PER WEEK  . atorvastatin (LIPITOR) 80 MG tablet TAKE 1 TABLET BY MOUTH EVERY DAY  . buPROPion (WELLBUTRIN XL) 300 MG 24 hr tablet TAKE 1 TABLET BY MOUTH EVERY DAY IN THE MORNING  . Cholecalciferol (VITAMIN D3) 2000 units TABS Take 10,000 tablets by mouth daily.   . DULoxetine (CYMBALTA) 60 MG capsule TAKE 1 CAPSULE DAILY FOR MOOD & ANXIETY  . finasteride (PROSCAR) 5 MG tablet TAKE 1 TABLET BY MOUTH EVERY DAY FOR PROSTATE  . silodosin (RAPAFLO) 8 MG CAPS capsule Take 1 capsule at night for Prostate   No current facility-administered medications on file prior to visit.      Allergies:  Allergies  Allergen Reactions  . Codeine Other (See Comments)    "can's sleep"  . Effexor [Venlafaxine] Other (See Comments)    ED  . Prednisone Other (See Comments)    "can't sleep"  . Tramadol Hcl Itching  . Zinc Other (See Comments)    "stomache upset"     Medical History:  Past Medical History:  Diagnosis Date  . Abnormal glucose   . Borderline hypertension   . BPH (benign prostatic hypertrophy)   . Crushing injury of left elbow and forearm 07/13/2015  . 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 Hyperlipidemia   . Hypertension   . Hypogonadism male   . 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 Morbid obesity ([facility])   . Obesity (BMI 30-39.9)   . OSA on CPAP   . Prediabetes   . Right ureteral stone   . Sleep apnea    C PAP  . Vitamin D deficiency   . Wears glasses    Family history- Reviewed and unchanged Social history- Reviewed and unchanged   Review of Systems:  Review of Systems  Constitutional: Negative for malaise/fatigue and weight loss.  HENT: Negative for hearing loss and tinnitus.   Eyes: Negative for blurred vision and double vision.  Respiratory: Negative for cough, shortness of breath  and wheezing.   Cardiovascular: Negative for chest pain, palpitations, orthopnea, claudication and leg swelling.  Gastrointestinal: Negative for abdominal pain, blood in stool, constipation, diarrhea, heartburn, melena, nausea and vomiting.  Genitourinary: Negative for dysuria, flank pain, frequency, hematuria and urgency.       Endorses improved slow urine stream  Musculoskeletal: Positive for joint pain (left knee). Negative for myalgias.  Skin: Negative for rash.  Neurological: Negative for dizziness, tingling, sensory change, weakness and headaches.  Endo/Heme/Allergies: Negative for polydipsia.  Psychiatric/Behavioral: Negative for depression.  All other systems reviewed and are negative.   Physical Exam: BP 112/74   Pulse 85   Temp (!) 97.5 F (36.4 C)   Wt 266 lb 9.6 oz (120.9 kg)   SpO2 96%   BMI 37.18 kg/m  Wt Readings from Last 3 Encounters:  07/28/19 266 lb 9.6 oz (120.9 kg)  07/05/19 268 lb 12.8 oz (121.9 kg)  04/25/19 272 lb 6.4 oz (123.6 kg)   General Appearance: Well nourished, in no apparent distress. Eyes: PERRLA, EOMs, conjunctiva no swelling or erythema Sinuses: No Frontal/maxillary tenderness  ENT/Mouth: Ext aud canals clear, TMs without erythema, bulging. No erythema, swelling, or exudate on post pharynx.  Tonsils not swollen or erythematous. Hearing normal.  Neck: Supple, thyroid normal.  Respiratory: Respiratory effort normal, BS equal bilaterally without rales, rhonchi, wheezing or stridor.  Cardio: RRR with no MRGs. Brisk peripheral pulses without edema.  Abdomen: Soft, + BS.  Non tender, no guarding, rebound, hernias, masses. Lymphatics: Non tender without lymphadenopathy.  Musculoskeletal: Full ROM, 5/5 strength, Normal gait. Bil knees without effusion, crepitus, popping/clicking, laxity.  Skin: Warm, dry without rashes, lesions, ecchymosis.  Neuro: Cranial nerves intact. No cerebellar symptoms.  Psych: Awake and oriented X 3, normal affect, Insight  and Judgment appropriate.   Izora Ribas, NP 4:44 PM [facility]

## 2019-07-28 ENCOUNTER — Other Ambulatory Visit: Payer: Self-pay

## 2019-07-28 ENCOUNTER — Ambulatory Visit (INDEPENDENT_AMBULATORY_CARE_PROVIDER_SITE_OTHER): Payer: BC Managed Care – PPO | Admitting: Adult Health

## 2019-07-28 ENCOUNTER — Encounter: Payer: Self-pay | Admitting: Adult Health

## 2019-07-28 VITALS — BP 112/74 | HR 85 | Temp 97.5°F | Wt 266.6 lb

## 2019-07-28 DIAGNOSIS — E291 Testicular hypofunction: Secondary | ICD-10-CM

## 2019-07-28 DIAGNOSIS — E782 Mixed hyperlipidemia: Secondary | ICD-10-CM

## 2019-07-28 DIAGNOSIS — Z87891 Personal history of nicotine dependence: Secondary | ICD-10-CM

## 2019-07-28 DIAGNOSIS — R7309 Other abnormal glucose: Secondary | ICD-10-CM

## 2019-07-28 DIAGNOSIS — R7303 Prediabetes: Secondary | ICD-10-CM

## 2019-07-28 DIAGNOSIS — F3341 Major depressive disorder, recurrent, in partial remission: Secondary | ICD-10-CM

## 2019-07-28 DIAGNOSIS — E559 Vitamin D deficiency, unspecified: Secondary | ICD-10-CM

## 2019-07-28 DIAGNOSIS — Z79899 Other long term (current) drug therapy: Secondary | ICD-10-CM | POA: Diagnosis not present

## 2019-07-28 DIAGNOSIS — I10 Essential (primary) hypertension: Secondary | ICD-10-CM

## 2019-07-28 NOTE — Patient Instructions (Signed)
Goals    . DIET - INCREASE WATER INTAKE     Aim for 65-80+ fluid ounces daily     . Weight (lb) < 260 lb (117.9 kg)         Benign Prostatic Hyperplasia  Benign prostatic hyperplasia (BPH) is an enlarged prostate gland that is caused by the normal aging process and not by cancer. The prostate is a walnut-sized gland that is involved in the production of semen. It is located in front of the rectum and below the bladder. The bladder stores urine and the urethra is the tube that carries the urine out of the body. The prostate may get bigger as a man gets older. An enlarged prostate can press on the urethra. This can make it harder to pass urine. The build-up of urine in the bladder can cause infection. Back pressure and infection may progress to bladder damage and kidney (renal) failure. What are the causes? This condition is part of a normal aging process. However, not all men develop problems from this condition. If the prostate enlarges away from the urethra, urine flow will not be blocked. If it enlarges toward the urethra and compresses it, there will be problems passing urine. What increases the risk? This condition is more likely to develop in men over the age of 50 years. What are the signs or symptoms? Symptoms of this condition include:  Getting up often during the night to urinate.  Needing to urinate frequently during the day.  Difficulty starting urine flow.  Decrease in size and strength of your urine stream.  Leaking (dribbling) after urinating.  Inability to pass urine. This needs immediate treatment.  Inability to completely empty your bladder.  Pain when you pass urine. This is more common if there is also an infection.  Urinary tract infection (UTI). How is this diagnosed? This condition is diagnosed based on your medical history, a physical exam, and your symptoms. Tests will also be done, such as:  A post-void bladder scan. This measures any amount of urine  that may remain in your bladder after you finish urinating.  A digital rectal exam. In a rectal exam, your health care provider checks your prostate by putting a lubricated, gloved finger into your rectum to feel the back of your prostate gland. This exam detects the size of your gland and any abnormal lumps or growths.  An exam of your urine (urinalysis).  A prostate specific antigen (PSA) screening. This is a blood test used to screen for prostate cancer.  An ultrasound. This test uses sound waves to electronically produce a picture of your prostate gland. Your health care provider may refer you to a specialist in kidney and prostate diseases (urologist). How is this treated? Once symptoms begin, your health care provider will monitor your condition (active surveillance or watchful waiting). Treatment for this condition will depend on the severity of your condition. Treatment may include:  Observation and yearly exams. This may be the only treatment needed if your condition and symptoms are mild.  Medicines to relieve your symptoms, including: ? Medicines to shrink the prostate. ? Medicines to relax the muscle of the prostate.  Surgery in severe cases. Surgery may include: ? Prostatectomy. In this procedure, the prostate tissue is removed completely through an open incision or with a laparoscope or robotics. ? Transurethral resection of the prostate (TURP). In this procedure, a tool is inserted through the opening at the tip of the penis (urethra). It is used to cut  away tissue of the inner core of the prostate. The pieces are removed through the same opening of the penis. This removes the blockage. ? Transurethral incision (TUIP). In this procedure, small cuts are made in the prostate. This lessens the prostate's pressure on the urethra. ? Transurethral microwave thermotherapy (TUMT). This procedure uses microwaves to create heat. The heat destroys and removes a small amount of prostate  tissue. ? Transurethral needle ablation (TUNA). This procedure uses radio frequencies to destroy and remove a small amount of prostate tissue. ? Interstitial laser coagulation (Faxon). This procedure uses a laser to destroy and remove a small amount of prostate tissue. ? Transurethral electrovaporization (TUVP). This procedure uses electrodes to destroy and remove a small amount of prostate tissue. ? Prostatic urethral lift. This procedure inserts an implant to push the lobes of the prostate away from the urethra. Follow these instructions at home:  Take over-the-counter and prescription medicines only as told by your health care provider.  Monitor your symptoms for any changes. Contact your health care provider with any changes.  Avoid drinking large amounts of liquid before going to bed or out in public.  Avoid or reduce how much caffeine or alcohol you drink.  Give yourself time when you urinate.  Keep all follow-up visits as told by your health care provider. This is important. Contact a health care provider if:  You have unexplained back pain.  Your symptoms do not get better with treatment.  You develop side effects from the medicine you are taking.  Your urine becomes very dark or has a bad smell.  Your lower abdomen becomes distended and you have trouble passing your urine. Get help right away if:  You have a fever or chills.  You suddenly cannot urinate.  You feel lightheaded, or very dizzy, or you faint.  There are large amounts of blood or clots in the urine.  Your urinary problems become hard to manage.  You develop moderate to severe low back or flank pain. The flank is the side of your body between the ribs and the hip. These symptoms may represent a serious problem that is an emergency. Do not wait to see if the symptoms will go away. Get medical help right away. Call your local emergency services (911 in the [place] Do not drive yourself to the hospital.  Summary  Benign prostatic hyperplasia (BPH) is an enlarged prostate that is caused by the normal aging process and not by cancer.  An enlarged prostate can press on the urethra. This can make it hard to pass urine.  This condition is part of a normal aging process and is more likely to develop in men over the age of 44 years.  Get help right away if you suddenly cannot urinate. This information is not intended to replace advice given to you by your health care provider. Make sure you discuss any questions you have with your health care provider. Document Released: 12/15/2005 Document Revised: 11/09/2018 Document Reviewed: 01/19/2017 Elsevier Patient Education  2020 [employer].

## 2019-07-29 LAB — COMPLETE METABOLIC PANEL WITH GFR
AG Ratio: 2.2 (calc) (ref 1.0–2.5)
ALT: 35 U/L (ref 9–46)
AST: 24 U/L (ref 10–40)
Albumin: 4.6 g/dL (ref 3.6–5.1)
Alkaline phosphatase (APISO): 68 U/L (ref 36–130)
BUN: 9 mg/dL (ref 7–25)
CO2: 29 mmol/L (ref 20–32)
Calcium: 9.6 mg/dL (ref 8.6–10.3)
Chloride: 104 mmol/L (ref 98–110)
Creat: 1.24 mg/dL (ref 0.60–1.35)
GFR, Est African American: 79 mL/min/{1.73_m2} (ref >=60)
GFR, Est Non African American: 68 mL/min/{1.73_m2} (ref >=60)
Globulin: 2.1 g/dL (calc) (ref 1.9–3.7)
Glucose, Bld: 88 mg/dL (ref 65–99)
Potassium: 4.2 mmol/L (ref 3.5–5.3)
Sodium: 140 mmol/L (ref 135–146)
Total Bilirubin: 0.8 mg/dL (ref 0.2–1.2)
Total Protein: 6.7 g/dL (ref 6.1–8.1)

## 2019-07-29 LAB — CBC WITH DIFFERENTIAL/PLATELET
Absolute Monocytes: 686 cells/uL (ref 200–950)
Basophils Absolute: 73 cells/uL (ref 0–200)
Basophils Relative: 1.1 %
Eosinophils Absolute: 343 cells/uL (ref 15–500)
Eosinophils Relative: 5.2 %
HCT: 42.4 % (ref 38.5–50.0)
Hemoglobin: 14.9 g/dL (ref 13.2–17.1)
Lymphs Abs: 1802 cells/uL (ref 850–3900)
MCH: 32.8 pg (ref 27.0–33.0)
MCHC: 35.1 g/dL (ref 32.0–36.0)
MCV: 93.4 fL (ref 80.0–100.0)
MPV: 11 fL (ref 7.5–12.5)
Monocytes Relative: 10.4 %
Neutro Abs: 3696 cells/uL (ref 1500–7800)
Neutrophils Relative %: 56 %
Platelets: 27 10*3/uL — ABNORMAL LOW (ref 140–400)
RBC: 4.54 10*6/uL (ref 4.20–5.80)
RDW: 13.1 % (ref 11.0–15.0)
Total Lymphocyte: 27.3 %
WBC: 6.6 10*3/uL (ref 3.8–10.8)

## 2019-07-29 LAB — LIPID PANEL
Cholesterol: 139 mg/dL (ref <200)
HDL: 35 mg/dL — ABNORMAL LOW (ref >=40)
LDL Cholesterol (Calc): 76 mg/dL (calc)
Non-HDL Cholesterol (Calc): 104 mg/dL (calc) (ref <130)
Total CHOL/HDL Ratio: 4 (calc) (ref <5.0)
Triglycerides: 188 mg/dL — ABNORMAL HIGH (ref <150)

## 2019-07-29 LAB — MAGNESIUM: Magnesium: 2 mg/dL (ref 1.5–2.5)

## 2019-07-29 LAB — TSH: TSH: 2.09 mIU/L (ref 0.40–4.50)

## 2019-09-21 ENCOUNTER — Other Ambulatory Visit: Payer: Self-pay

## 2019-09-21 ENCOUNTER — Encounter: Payer: Self-pay | Admitting: Adult Health

## 2019-09-21 ENCOUNTER — Ambulatory Visit: Payer: BC Managed Care – PPO | Admitting: Adult Health

## 2019-09-21 VITALS — BP 122/72 | HR 79 | Temp 97.7°F | Ht 71.0 in | Wt 266.0 lb

## 2019-09-21 DIAGNOSIS — R600 Localized edema: Secondary | ICD-10-CM

## 2019-09-21 DIAGNOSIS — R220 Localized swelling, mass and lump, head: Secondary | ICD-10-CM | POA: Diagnosis not present

## 2019-09-21 MED ORDER — MONTELUKAST SODIUM 10 MG PO TABS: 10.0000 mg | ORAL_TABLET | Freq: Every day | ORAL | 2 refills | Status: DC

## 2019-09-21 MED ORDER — PREDNISONE 20 MG PO TABS: ORAL_TABLET | ORAL | 0 refills | Status: DC

## 2019-09-21 NOTE — Patient Instructions (Addendum)
Melkersson-Rosenthal syndrome  Hereditary angioedema syndrom    Add claritin or zyrtec in the morning, singulair  Checking labs, but if negative will refer to ENT

## 2019-09-21 NOTE — Progress Notes (Signed)
Assessment and Plan:  Shane Saunders was seen today for oral swelling.  Diagnoses and all orders for this visit:  Right facial swelling/Lip edema Recurrent episodic unilateral lip/facial edema Unclear etiology with exam unremarkable today excepting mild edema of left upper lip and cheek; no palpable abnormalities Has seen allergist, non-illuminating Discussed with Dr. Melford Aase; he suggests consider differential of Melkersson-Rosenthal syndrome, hereditary angioedema syndrome; no paralysis has been noted on exam; will check full complement panel today per his recommendation and refer to ENT for further workup and firm diagnosis due to unclear etiology by outpatient workup including CT thus far. After discussion with patient he is in agreement. Declines steroid taper today.   Suggested addition of daily zyrtec in AM; restart singulair -     Complement, total -     C1 Esterase Inhibitor, Functional -     C3 and C4  Other orders -     montelukast (SINGULAIR) 10 MG tablet; Take 1 tablet (10 mg total) by mouth daily.  Further disposition pending results of labs. Discussed med's effects and SE's.   Over 30 minutes of exam, counseling, chart review, and critical decision making was performed.   Future Appointments  Date Time Provider [place]  11/01/2019  4:30 PM Unk Pinto, MD [facility] None  05/07/2020  2:00 PM Unk Pinto, MD [facility] None    ------------------------------------------------------------------------------------------------------------------   HPI BP 122/72   Pulse 79   Temp 97.7 F (36.5 C)   Ht 5\' 11"  (1.803 m)   Wt 266 lb (120.7 kg)   SpO2 93%   BMI 37.10 kg/m   48 y.o.male presents for follow up for flare of recurrent upper lip and cheek edema; he reports this is intermittently going on for approx 2 years, first in 12/2017, went to ED with unremarkable workup, resolved after treatment with abx and steroid taper. This is ? 5th episode. Worse in the last  6 months, swelling seems worse with each episode; had CT soft tissue neck in 07/18/2019 to r/o parotid which was unremarkable excepting asymmetrical R masseter muscle with indistinct borders and inflammation. CBC, CMP were normal at that time.   He is very frustrated with lack of explanation and progress with explaining recurrent episodes.   He has completed a referral with [facility].  There was modifications to his allergy regiment including taking allegra and fluticasone.  Reports the skin prick test reveal allergy to multiple environmental plant/tree allergies.  He is unable to associate this edema with any dietary or environmental exposures. He currently takes benadryl nightly. Has taken ibuprofen with flare without significant benefit.   He has sleep apnea and wears a CPAP mask nightly.  Reports he clean the tubing every two weeks and his mask monthly.  Reports that the mask fits well and he does not have any difficulties with the fit.    This current flare started 9 pm last night; he reports onset with a burning sensation in left upper lip, spreads to cheek, more on inner lip than exterior, followed by swelling and describes area over swelling turning pale/white color. Denies any lesions. Not associated with food intake, CPAP use, time of day, location.   had dinner 5 pm; no notable/atypical foods; no drooling, cough, difficulty breathing, wheezing  He has seen dentist with no concerns  CT soft tissue neck 07/18/2019 1. Mild asymmetry of the right masseter muscle. The borders are slightly indistinct and there may be some inflammation. This could be the source of pain  and swelling. 2. No salivary gland or duct dilation.   Past Medical History:  Diagnosis Date  . Abnormal glucose   . Borderline hypertension   . BPH (benign prostatic hypertrophy)   . Crushing injury of left elbow and forearm 07/13/2015  . 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  Hyperlipidemia   . Hypertension   . Hypogonadism male   . 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 Morbid obesity (HCC)   . Obesity (BMI 30-39.9)   . OSA on CPAP   . Prediabetes   . Right ureteral stone   . Sleep apnea    C PAP  . Vitamin D deficiency   . Wears glasses      Allergies  Allergen Reactions  . Codeine Other (See Comments)    "can's sleep"  . Effexor [Venlafaxine] Other (See Comments)    ED  . Prednisone Other (See Comments)    "can't sleep"  . Tramadol Hcl Itching  . Zinc Other (See Comments)    "stomache upset"    Current Outpatient Medications on File Prior to Visit  Medication Sig  . ALPRAZolam (XANAX) 1 MG tablet TAKE 1/2 TO 1 TABLET 2 TO 3 TIMES A DAY AS NEEDED FOR PANIC/ANXIETY ATTACKS. LIMIT 5DAYS PER WEEK  . atorvastatin (LIPITOR) 80 MG tablet TAKE 1 TABLET BY MOUTH EVERY DAY  . buPROPion (WELLBUTRIN XL) 300 MG 24 hr tablet TAKE 1 TABLET BY MOUTH EVERY DAY IN THE MORNING  . Cholecalciferol (VITAMIN D3) 2000 units TABS Take 10,000 tablets by mouth daily.   . DULoxetine (CYMBALTA) 60 MG capsule TAKE 1 CAPSULE DAILY FOR MOOD & ANXIETY  . finasteride (PROSCAR) 5 MG tablet TAKE 1 TABLET BY MOUTH EVERY DAY FOR PROSTATE  . silodosin (RAPAFLO) 8 MG CAPS capsule Take 1 capsule at night for Prostate   No current facility-administered medications on file prior to visit.     ROS: all negative except above.   Physical Exam:  BP 122/72   Pulse 79   Temp 97.7 F (36.5 C)   Ht 5\' 11"  (1.803 m)   Wt 266 lb (120.7 kg)   SpO2 93%   BMI 37.10 kg/m   General Appearance: Well nourished, in no apparent distress. Eyes: PERRLA, EOMs, conjunctiva no swelling or erythema Sinuses: No Frontal/maxillary sinus tenderness ENT/Mouth: Ext aud canals clear, TMs without erythema, bulging. No erythema, swelling, or exudate on post pharynx.  Tonsils not swollen or erythematous. Hearing normal. He has mild edema of left upper lip extending past  nasolabial fold; no palpable lump; no lesions exterior, on lip, or on mucus membranes. No erythema at this time. Not significantly tender. Parotid and salivary glands non-tender, not enlarged.  Neck: Supple, no cervical lymphadenopathy palpable  Respiratory: Respiratory effort normal, BS equal bilaterally without rales, rhonchi, wheezing or stridor.  Cardio: RRR with no MRGs. Brisk peripheral pulses without edema.  Abdomen: Soft, + BS.  Non tender. Lymphatics: Non tender without lymphadenopathy.  Musculoskeletal: normal gait; no TMJ tenderness.  Skin: Warm, dry without rashes, lesions, ecchymosis. NO erythema of left lip/cheek.  Neuro: Cranial nerves intact. Normal muscle tone, Sensation intact.  Psych: Awake and oriented X 3, normal affect, Insight and Judgment appropriate.     , NP 4:20 PM [facility]

## 2019-09-25 LAB — C3 AND C4
C3 Complement: 134 mg/dL (ref 82–185)
C4 Complement: 32 mg/dL (ref 15–53)

## 2019-09-25 LAB — COMPLEMENT, TOTAL: Compl, Total (CH50): >60 U/mL — ABNORMAL HIGH (ref 31–60)

## 2019-09-25 LAB — C1 ESTERASE INHIBITOR, FUNCTIONAL: C1 Esterase Inhibitor Funct: 92 % (ref >=68)

## 2019-10-10 ENCOUNTER — Other Ambulatory Visit: Payer: Self-pay | Admitting: Adult Health

## 2019-10-11 ENCOUNTER — Ambulatory Visit: Payer: BC Managed Care – PPO | Admitting: Physician Assistant

## 2019-10-11 ENCOUNTER — Other Ambulatory Visit: Payer: Self-pay

## 2019-10-11 ENCOUNTER — Encounter: Payer: Self-pay | Admitting: Physician Assistant

## 2019-10-11 VITALS — BP 136/80 | HR 68 | Temp 97.3°F | Ht 71.0 in | Wt 267.0 lb

## 2019-10-11 DIAGNOSIS — R21 Rash and other nonspecific skin eruption: Secondary | ICD-10-CM | POA: Diagnosis not present

## 2019-10-11 DIAGNOSIS — M545 Low back pain, unspecified: Secondary | ICD-10-CM

## 2019-10-11 DIAGNOSIS — G8929 Other chronic pain: Secondary | ICD-10-CM

## 2019-10-11 DIAGNOSIS — N452 Orchitis: Secondary | ICD-10-CM

## 2019-10-11 DIAGNOSIS — R220 Localized swelling, mass and lump, head: Secondary | ICD-10-CM | POA: Diagnosis not present

## 2019-10-11 MED ORDER — LEVOFLOXACIN 500 MG PO TABS: 500.0000 mg | ORAL_TABLET | Freq: Every day | ORAL | 0 refills | Status: DC

## 2019-10-11 NOTE — Patient Instructions (Addendum)
Take the diflucan 150mg  1 pill once a week for 4 weeks.  Will check some viral labs on you.    Folliculitis  Folliculitis is inflammation of the hair follicles. Folliculitis most commonly occurs on the scalp, thighs, legs, back, and buttocks. However, it can occur anywhere on the body. What are the causes? This condition may be caused by:  A bacterial infection (common).  A fungal infection.  A viral infection.  Contact with certain chemicals, especially oils and tars.  Shaving or waxing.  Greasy ointments or creams applied to the skin. Long-lasting folliculitis and folliculitis that keeps coming back may be caused by bacteria. This bacteria can live anywhere on your skin and is often found in the nostrils. What increases the risk? You are more likely to develop this condition if you have:  A weakened immune system.  Diabetes.  Obesity. What are the signs or symptoms? Symptoms of this condition include:  Redness.  Soreness.  Swelling.  Itching.  Small white or yellow, pus-filled, itchy spots (pustules) that appear over a reddened area. If there is an infection that goes deep into the follicle, these may develop into a boil (furuncle).  A group of closely packed boils (carbuncle). These tend to form in hairy, sweaty areas of the body. How is this diagnosed? This condition is diagnosed with a skin exam. To find what is causing the condition, your health care provider may take a sample of one of the pustules or boils for testing in a lab. How is this treated? This condition may be treated by:  Applying warm compresses to the affected areas.  Taking an antibiotic medicine or applying an antibiotic medicine to the skin.  Applying or bathing with an antiseptic solution.  Taking an over-the-counter medicine to help with itching.  Having a procedure to drain any pustules or boils. This may be done if a pustule or boil contains a lot of pus or fluid.  Having laser  hair removal. This may be done to treat long-lasting folliculitis. Follow these instructions at home: Managing pain and swelling   If directed, apply heat to the affected area as often as told by your health care provider. Use the heat source that your health care provider recommends, such as a moist heat pack or a heating pad. ? Place a towel between your skin and the heat source. ? Leave the heat on for 20-30 minutes. ? Remove the heat if your skin turns bright red. This is especially important if you are unable to feel pain, heat, or cold. You may have a greater risk of getting burned. General instructions  If you were prescribed an antibiotic medicine, take it or apply it as told by your health care provider. Do not stop using the antibiotic even if your condition improves.  Check the irritated area every day for signs of infection. Check for: ? Redness, swelling, or pain. ? Fluid or blood. ? Warmth. ? Pus or a bad smell.  Do not shave irritated skin.  Take over-the-counter and prescription medicines only as told by your health care provider.  Keep all follow-up visits as told by your health care provider. This is important. Get help right away if:  You have more redness, swelling, or pain in the affected area.  Red streaks are spreading from the affected area.  You have a fever. Summary  Folliculitis is inflammation of the hair follicles. Folliculitis most commonly occurs on the scalp, thighs, legs, back, and buttocks.  This condition  may be treated by taking an antibiotic medicine or applying an antibiotic medicine to the skin, and applying or bathing with an antiseptic solution.  If you were prescribed an antibiotic medicine, take it or apply it as told by your health care provider. Do not stop using the antibiotic even if your condition improves.  Get help right away if you have new or worsening symptoms.  Keep all follow-up visits as told by your health care  provider. This is important. This information is not intended to replace advice given to you by your health care provider. Make sure you discuss any questions you have with your health care provider. Document Released: 02/23/2002 Document Revised: 07/24/2018 Document Reviewed: 07/24/2018 Elsevier Patient Education  2020 [employer].

## 2019-10-11 NOTE — Progress Notes (Signed)
Subjective:    Patient ID: Shane Saunders, male    DOB: [birth year], 48 y.o.   MRN: [id number]  HPI 48 y.o. WM presents with rash x Feb along with multitude of symptoms since about 2016.   Has had intermittent rash x Feb, wife states has been getting progressively.  Spot were on his arms, but now arms, some on central chest at breast bone, and under his AB in the folds. He states will scab over and than will have itching. Wife states he has on on his penis. No drainage but will bleed/scab over, states there is a "hole" Lesions are painful/itchy. .  No one else with rash.  No new sexual contacts Denies specific medication, food, skin care product, detergent, soap, or other environmental triggers have been identified. She did not experience concomitant cardiopulmonary or GI symptoms.  She has no specific nasal symptom complaints today.  States this AM upper right lip/cheek was swollen this AM x 10 AM, has OV to go see ENT.  He has had episodes of recurrent upper lip and cheek edema, first noted 12/2017. He had CT soft tissue neck 07/18/2019, has seen [facility], on allegra and flonase and allegra. Had skin allergy testing revealing allergy to multiple environmental plant/tree allergies. He had negative complement testing last visit. Negative ANA 2016 Negative Hep and HIV testing 2016, negative lyme 2016.  No known rosacea.  Some dry eyes, no dry mouth.    He also has had some blurry vision, migrating myalgias and back pain.   Review of Systems  Constitutional: Negative for activity change, appetite change, chills, diaphoresis, fatigue, fever and unexpected weight change.  HENT: Negative.   Eyes: Positive for visual disturbance. Negative for photophobia, pain, discharge, redness and itching.  Respiratory: Negative.   Cardiovascular: Negative.  Negative for leg swelling.  Gastrointestinal: Negative.   Endocrine: Negative.   Genitourinary: Positive for decreased urine volume,  difficulty urinating (on medication to help, having anejaculation. ), genital sores and testicular pain. Negative for discharge, dysuria, enuresis, flank pain, frequency, hematuria, penile pain, penile swelling, scrotal swelling and urgency.  Musculoskeletal: Positive for arthralgias, back pain and myalgias. Negative for gait problem, joint swelling, neck pain and neck stiffness.  Skin: Positive for rash. Negative for color change, pallor and wound.  Neurological: Negative.   Hematological: Negative.  Negative for adenopathy.  Psychiatric/Behavioral: Negative.        Objective:   Physical Exam Constitutional:      General: He is not in acute distress.    Appearance: He is well-developed. He is obese. He is not ill-appearing.  HENT:     Head: Normocephalic and atraumatic.     Salivary Glands: Right salivary gland is not diffusely enlarged or tender. Left salivary gland is not diffusely enlarged or tender.     Comments: Patient has localized swelling at right lip, to nasal fold extending into cheek. Non tender, no warmth, erythema. Non tender parotid. No mucosal lesions or abscess, no lymphadenopathy    Right Ear: External ear normal.     Left Ear: External ear normal.  Eyes:     Conjunctiva/sclera: Conjunctivae normal.     Pupils: Pupils are equal, round, and reactive to light.  Neck:     Musculoskeletal: Normal range of motion and neck supple.  Cardiovascular:     Rate and Rhythm: Normal rate and regular rhythm.     Heart sounds: Normal heart sounds.  Pulmonary:     Effort: Pulmonary effort is normal.  Breath sounds: Normal breath sounds.  Abdominal:     General: Abdomen is protuberant. Bowel sounds are normal.     Palpations: Abdomen is soft.     Hernia: There is no hernia in the left inguinal area or right inguinal area.  Genitourinary:    Penis: Lesions (right sided erytheamtous ulcer on distal penis. ) present. No tenderness or swelling.      Scrotum/Testes:        Right:  Tenderness present. Mass, swelling, testicular hydrocele or varicocele not present. Right testis is descended.        Left: Tenderness present. Mass, swelling, testicular hydrocele or varicocele not present. Left testis is descended.     Epididymis:     Right: Normal. Not inflamed. No tenderness.     Left: Normal. Not inflamed. No tenderness.  Musculoskeletal: Normal range of motion.  Lymphadenopathy:     Lower Body: No right inguinal adenopathy. No left inguinal adenopathy.  Skin:    General: Skin is warm and dry.     Findings: Rash present. Rash is crusting and nodular.     Comments: Has scattered nodular erythematous rash in different stages of healing with some ulcerative on bilateral arms, under AB folds, and on chest.   Neurological:     Mental Status: He is alert and oriented to person, place, and time.     Cranial Nerves: No cranial nerve deficit.  Psychiatric:        Behavior: Behavior normal.    Has tender           Assessment & Plan:   Patient with multitude of symptoms presents with intermittent rash, facial swelling, orchitis, blurry vision, and joint pain.  Will refer to derm for possible biopsy/evaluation Will get autoimmune labs rule out reactive arthritis, lupus, herpes, sjogrens- may need referral to rheumotology Will treat rash as possible follicultis with levaquin and diflucan.  Informed patient that if he has ANY new symptoms to please inform us.   Rash -     HSV(herpes simplex vrs) 1+2 ab-IgG -     CBC with Differential/Platelet -     COMPLETE METABOLIC PANEL WITH GFR -     Ambulatory referral to Dermatology -     Sedimentation rate -     Anti-DNA antibody, double-stranded -     CK -     Sjogrens syndrome-A extractable nuclear antibody -     Sjogrens syndrome-B extractable nuclear antibody -     Cancel: [facility] Antibodies -     RNP Antibody -     levofloxacin (LEVAQUIN) 500 MG tablet; Take 1 tablet (500 mg total) by mouth daily. -     HLA-B27  antigen  Right facial swelling -     Sedimentation rate -     Anti-DNA antibody, double-stranded -     CK -     Sjogrens syndrome-A extractable nuclear antibody -     Sjogrens syndrome-B extractable nuclear antibody -     Cancel: [employer] Antibodies -     RNP Antibody -     Mumps antibody, IgM  Orchitis -     Mumps antibody, IgM -     HLA-B27 antigen  Chronic bilateral low back pain without sciatica -     HLA-B27 antigen

## 2019-10-12 ENCOUNTER — Other Ambulatory Visit: Payer: Self-pay | Admitting: Physician Assistant

## 2019-10-12 LAB — CBC WITH DIFFERENTIAL/PLATELET
Absolute Monocytes: 778 cells/uL (ref 200–950)
Basophils Absolute: 72 cells/uL (ref 0–200)
Basophils Relative: 1 %
Eosinophils Absolute: 274 cells/uL (ref 15–500)
Eosinophils Relative: 3.8 %
HCT: 44 % (ref 38.5–50.0)
Hemoglobin: 15.3 g/dL (ref 13.2–17.1)
Lymphs Abs: 1757 cells/uL (ref 850–3900)
MCH: 32.3 pg (ref 27.0–33.0)
MCHC: 34.8 g/dL (ref 32.0–36.0)
MCV: 93 fL (ref 80.0–100.0)
MPV: 10.8 fL (ref 7.5–12.5)
Monocytes Relative: 10.8 %
Neutro Abs: 4320 cells/uL (ref 1500–7800)
Neutrophils Relative %: 60 %
Platelets: 78 10*3/uL — ABNORMAL LOW (ref 140–400)
RBC: 4.73 10*6/uL (ref 4.20–5.80)
RDW: 12.5 % (ref 11.0–15.0)
Total Lymphocyte: 24.4 %
WBC: 7.2 10*3/uL (ref 3.8–10.8)

## 2019-10-12 LAB — SEDIMENTATION RATE: Sed Rate: 2 mm/h (ref 0–15)

## 2019-10-12 LAB — COMPLETE METABOLIC PANEL WITH GFR
AG Ratio: 2.1 (calc) (ref 1.0–2.5)
ALT: 45 U/L (ref 9–46)
AST: 27 U/L (ref 10–40)
Albumin: 4.6 g/dL (ref 3.6–5.1)
Alkaline phosphatase (APISO): 72 U/L (ref 36–130)
BUN: 14 mg/dL (ref 7–25)
CO2: 30 mmol/L (ref 20–32)
Calcium: 9.8 mg/dL (ref 8.6–10.3)
Chloride: 104 mmol/L (ref 98–110)
Creat: 1.23 mg/dL (ref 0.60–1.35)
GFR, Est African American: 79 mL/min/{1.73_m2} (ref >=60)
GFR, Est Non African American: 69 mL/min/{1.73_m2} (ref >=60)
Globulin: 2.2 g/dL (calc) (ref 1.9–3.7)
Glucose, Bld: 87 mg/dL (ref 65–99)
Potassium: 4.4 mmol/L (ref 3.5–5.3)
Sodium: 140 mmol/L (ref 135–146)
Total Bilirubin: 0.7 mg/dL (ref 0.2–1.2)
Total Protein: 6.8 g/dL (ref 6.1–8.1)

## 2019-10-12 LAB — ANTI-DNA ANTIBODY, DOUBLE-STRANDED: ds DNA Ab: <1 IU/mL

## 2019-10-12 LAB — HSV(HERPES SIMPLEX VRS) I + II AB-IGG
HAV 1 IGG,TYPE SPECIFIC AB: 42.2 index — ABNORMAL HIGH
HSV 2 IGG,TYPE SPECIFIC AB: <0.9 index

## 2019-10-12 LAB — CK: Total CK: 140 U/L (ref 44–196)

## 2019-10-12 MED ORDER — FLUCONAZOLE 150 MG PO TABS: ORAL_TABLET | ORAL | 0 refills | Status: DC

## 2019-10-16 LAB — RNP ANTIBODY: Ribonucleic Protein(ENA) Antibody, IgG: <1 AI

## 2019-10-16 LAB — HLA-B27 ANTIGEN: HLA-B27 Antigen: NEGATIVE

## 2019-10-16 LAB — SJOGRENS SYNDROME-B EXTRACTABLE NUCLEAR ANTIBODY: SSB (La) (ENA) Antibody, IgG: <1 AI

## 2019-10-16 LAB — SJOGRENS SYNDROME-A EXTRACTABLE NUCLEAR ANTIBODY: SSA (Ro) (ENA) Antibody, IgG: <1 AI

## 2019-10-16 LAB — MUMPS ANTIBODY, IGM: Mumps IgM Value: <1:20 {titer}

## 2019-10-22 ENCOUNTER — Other Ambulatory Visit: Payer: Self-pay | Admitting: Internal Medicine

## 2019-10-22 MED ORDER — DEXAMETHASONE 0.5 MG PO TABS: ORAL_TABLET | ORAL | 0 refills | Status: DC

## 2019-10-22 MED ORDER — CYCLOBENZAPRINE HCL 10 MG PO TABS: ORAL_TABLET | ORAL | 0 refills | Status: DC

## 2019-10-26 ENCOUNTER — Other Ambulatory Visit: Payer: Self-pay

## 2019-10-26 DIAGNOSIS — Z20822 Contact with and (suspected) exposure to covid-19: Secondary | ICD-10-CM

## 2019-10-27 LAB — NOVEL CORONAVIRUS, NAA: SARS-CoV-2, NAA: NOT DETECTED

## 2019-10-31 ENCOUNTER — Encounter: Payer: Self-pay | Admitting: Internal Medicine

## 2019-10-31 NOTE — Patient Instructions (Signed)

## 2019-10-31 NOTE — Progress Notes (Signed)
History of Present Illness:      This very nice 48 y.o. MWM  presents for 6 month follow up with HTN, HLD, Pre-Diabetes and Vitamin D Deficiency. Patient also is on CPAP for OSA with improved Sleep Hygiene.      Patient has hx/o Depression  and is currently on Cymbalta.       Patient is treated for HTN (2010) & BP has been controlled at home. Today's  . Patient has had no complaints of any cardiac type chest pain, palpitations, dyspnea / orthopnea / PND, dizziness, claudication, or dependent edema.      Hyperlipidemia is controlled with diet & Lipitor. Patient denies myalgias or other med SE's. Last Lipids were at goal albeit elevated Trig's:  Lab Results  Component Value Date   CHOL 139 07/28/2019   HDL 35 (L) 07/28/2019   LDLCALC 76 07/28/2019   TRIG 188 (H) 07/28/2019   CHOLHDL 4.0 07/28/2019        Also, the patient is morbidly obese (BMI 37+)  and has history of PreDiabetes (A1c 5.8% / 2016).    and has had no symptoms of reactive hypoglycemia, diabetic polys, paresthesias or visual blurring.  Last A1c was Normal & at goal:  Lab Results  Component Value Date   HGBA1C 5.5 04/25/2019       Further, the patient also has history of Vitamin D Deficiency and supplements vitamin D without any suspected side-effects. Last vitamin D was at goal:  Lab Results  Component Value Date   VD25OH 84 04/25/2019    Current Outpatient Medications on File Prior to Visit  Medication Sig  . ALPRAZolam (XANAX) 1 MG tablet Take 1/2-1 tablet 2 - 3 x /day ONLY if needed for Anxiety or Panic Attack &  limit to 5 days /week to avoid addiction  . atorvastatin (LIPITOR) 80 MG tablet TAKE 1 TABLET BY MOUTH EVERY DAY  . buPROPion (WELLBUTRIN XL) 300 MG 24 hr tablet TAKE 1 TABLET BY MOUTH EVERY DAY IN THE MORNING  . Cholecalciferol (VITAMIN D3) 2000 units TABS Take 10,000 tablets by mouth daily.   . cyclobenzaprine (FLEXERIL) 10 MG tablet Take 1/2 to 1 tablet 3 x /day as needed for Muscle Spasm   . DULoxetine (CYMBALTA) 60 MG capsule TAKE 1 CAPSULE DAILY FOR MOOD & ANXIETY  . finasteride (PROSCAR) 5 MG tablet TAKE 1 TABLET BY MOUTH EVERY DAY FOR PROSTATE  . montelukast (SINGULAIR) 10 MG tablet Take 1 tablet (10 mg total) by mouth daily.  . silodosin (RAPAFLO) 8 MG CAPS capsule Take 1 capsule at night for Prostate   No current facility-administered medications on file prior to visit.    Allergies  Allergen Reactions  . Codeine Other (See Comments)    "can's sleep"  . Effexor [Venlafaxine] Other (See Comments)    ED  . Prednisone Other (See Comments)    "can't sleep"  . Tramadol Hcl Itching  . Zinc Other (See Comments)    "stomache upset"   PMHx:   Past Medical History:  Diagnosis Date  . Abnormal glucose   . Borderline hypertension   . BPH (benign prostatic hypertrophy)   . Crushing injury of left elbow and forearm 07/13/2015  . 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 Hyperlipidemia   . Hypertension   . Hypogonadism male   . 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 Morbid obesity (HCC)   . Obesity (  BMI 30-39.9)   . OSA on CPAP   . Prediabetes   . Right ureteral stone   . Sleep apnea    C PAP  . Vitamin D deficiency   . Wears glasses    Immunization History  Administered Date(s) Administered  . Influenza Inj Mdck Quad With Preservative 10/12/2018  . Influenza-Unspecified 10/20/2014  . PPD Test 01/16/2015, 02/18/2017, 03/23/2018, 04/25/2019  . Pneumococcal-Unspecified 10/27/2006  . Tdap 08/05/2013, 05/31/2014   Past Surgical History:  Procedure Laterality Date  . 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 ARTERY REPAIR Right 05/31/2014   Procedure: IRRIGATION, EXPLORATION, AND REPAIR OF RIGHT ARM WOUND;  Surgeon: Cherylynn Ridges, MD;  Location: [facility];  Service: General;  Laterality: Right;  . CYSTOSCOPY WITH RETROGRADE PYELOGRAM, URETEROSCOPY AND  STENT PLACEMENT Right 07/11/2016   Procedure: CYSTOSCOPY WITH RETROGRADE PYELOGRAM, URETEROSCOPY, BASKET STONE EXTRACTION  AND STENT PLACEMENT;  Surgeon: Sebastian Ache, MD;  Location: [facility];  Service: Urology;  Laterality: Right;  45 MINS  C-ARM DIGITAL URETEROSCOPE HOLMIUM LASER  . ESOPHAGOGASTRODUODENOSCOPY     FHx:    Reviewed / unchanged  SHx:    Reviewed / unchanged   Systems Review:  Constitutional: Denies fever, chills, wt changes, headaches, insomnia, fatigue, night sweats, change in appetite. Eyes: Denies redness, blurred vision, diplopia, discharge, itchy, watery eyes.  ENT: Denies discharge, congestion, post nasal drip, epistaxis, sore throat, earache, hearing loss, dental pain, tinnitus, vertigo, sinus pain, snoring.  CV: Denies chest pain, palpitations, irregular heartbeat, syncope, dyspnea, diaphoresis, orthopnea, PND, claudication or edema. Respiratory: denies cough, dyspnea, DOE, pleurisy, hoarseness, laryngitis, wheezing.  Gastrointestinal: Denies dysphagia, odynophagia, heartburn, reflux, water brash, abdominal pain or cramps, nausea, vomiting, bloating, diarrhea, constipation, hematemesis, melena, hematochezia  or hemorrhoids. Genitourinary: Denies dysuria, frequency, urgency, nocturia, hesitancy, discharge, hematuria or flank pain. Musculoskeletal: Denies arthralgias, myalgias, stiffness, jt. swelling, pain, limping or strain/sprain.  Skin: Denies pruritus, rash, hives, warts, acne, eczema or change in skin lesion(s). Neuro: No weakness, tremor, incoordination, spasms, paresthesia or pain. Psychiatric: Denies confusion, memory loss or sensory loss. Endo: Denies change in weight, skin or hair change.  Heme/Lymph: No excessive bleeding, bruising or enlarged lymph nodes.  Physical Exam  There were no vitals taken for this visit.  Appears  well nourished, well groomed  and in no distress.  Eyes: PERRLA, EOMs, conjunctiva no swelling or erythema.  Sinuses: No frontal/maxillary tenderness ENT/Mouth: EAC's clear, TM's nl w/o erythema, bulging. Nares clear w/o erythema, swelling, exudates. Oropharynx clear without erythema or exudates. Oral hygiene is good. Tongue normal, non obstructing. Hearing intact.  Neck: Supple. Thyroid not palpable. Car 2+/2+ without bruits, nodes or JVD. Chest: Respirations nl with BS clear & equal w/o rales, rhonchi, wheezing or stridor.  Cor: Heart sounds normal w/ regular rate and rhythm without sig. murmurs, gallops, clicks or rubs. Peripheral pulses normal and equal  without edema.  Abdomen: Soft & bowel sounds normal. Non-tender w/o guarding, rebound, hernias, masses or organomegaly.  Lymphatics: Unremarkable.  Musculoskeletal: Full ROM all peripheral extremities, joint stability, 5/5 strength and normal gait.  Skin: Warm, dry without exposed rashes, lesions or ecchymosis apparent.  Neuro: Cranial nerves intact, reflexes equal bilaterally. Sensory-motor testing grossly intact. Tendon reflexes grossly intact.  Pysch: Alert & oriented x 3.  Insight and judgement nl & appropriate. No ideations.  Assessment and Plan:  1. Essential hypertension  - Continue medication, monitor blood pressure at home.  -  Continue DASH diet.  Reminder to go to the ER if any CP,  SOB, nausea, dizziness, severe HA, changes vision/speech.  - CBC with Differential/Platelet - COMPLETE METABOLIC PANEL WITH GFR - Magnesium - TSH  2. Hyperlipidemia, mixed  - Continue diet/meds, exercise,& lifestyle modifications.  - Continue monitor periodic cholesterol/liver & renal functions   - Lipid panel - TSH  3. Abnormal glucose  - Hemoglobin A1c - Insulin, random  4. Vitamin D deficiency  - Continue diet, exercise  - Lifestyle modifications.  - Monitor appropriate labs. - Continue supplementation.  - VITAMIN D 25 Hydroxyl  5. Prediabetes  - Hemoglobin A1c - Insulin, random  6. Morbid obesity ([place])  7. OSA on CPAP   8. Medication management  - CBC with Differential/Platelet - COMPLETE METABOLIC PANEL WITH GFR - Magnesium - Lipid panel - TSH - Hemoglobin A1c - Insulin, random - VITAMIN D 25 Hydroxyl         Discussed  regular exercise, BP monitoring, weight control to achieve/maintain BMI less than 25 and discussed med and SE's. Recommended labs to assess and monitor clinical status with further disposition pending results of labs.  I discussed the assessment and treatment plan with the patient. The patient was provided an opportunity to ask questions and all were answered. The patient agreed with the plan and demonstrated an understanding of the instructions.  I provided over 30 minutes of exam, counseling, chart review and  complex critical decision making.  Kirtland Bouchard, MD

## 2019-11-01 ENCOUNTER — Other Ambulatory Visit: Payer: Self-pay

## 2019-11-01 ENCOUNTER — Other Ambulatory Visit: Payer: Self-pay | Admitting: Internal Medicine

## 2019-11-01 ENCOUNTER — Ambulatory Visit: Payer: BC Managed Care – PPO | Admitting: Internal Medicine

## 2019-11-01 VITALS — BP 140/80 | HR 88 | Temp 97.9°F | Resp 16 | Ht 71.0 in | Wt 268.0 lb

## 2019-11-01 DIAGNOSIS — I10 Essential (primary) hypertension: Secondary | ICD-10-CM

## 2019-11-01 DIAGNOSIS — E782 Mixed hyperlipidemia: Secondary | ICD-10-CM

## 2019-11-01 DIAGNOSIS — Z9989 Dependence on other enabling machines and devices: Secondary | ICD-10-CM

## 2019-11-01 DIAGNOSIS — Z23 Encounter for immunization: Secondary | ICD-10-CM

## 2019-11-01 DIAGNOSIS — G4733 Obstructive sleep apnea (adult) (pediatric): Secondary | ICD-10-CM

## 2019-11-01 DIAGNOSIS — E559 Vitamin D deficiency, unspecified: Secondary | ICD-10-CM

## 2019-11-01 DIAGNOSIS — Z79899 Other long term (current) drug therapy: Secondary | ICD-10-CM

## 2019-11-01 DIAGNOSIS — R7309 Other abnormal glucose: Secondary | ICD-10-CM | POA: Diagnosis not present

## 2019-11-01 DIAGNOSIS — R7303 Prediabetes: Secondary | ICD-10-CM | POA: Diagnosis not present

## 2019-11-02 ENCOUNTER — Other Ambulatory Visit: Payer: Self-pay | Admitting: Otolaryngology

## 2019-11-02 DIAGNOSIS — S0540XA Penetrating wound of orbit with or without foreign body, unspecified eye, initial encounter: Secondary | ICD-10-CM

## 2019-11-02 DIAGNOSIS — H918X9 Other specified hearing loss, unspecified ear: Secondary | ICD-10-CM

## 2019-11-02 LAB — COMPLETE METABOLIC PANEL WITH GFR
AG Ratio: 2.3 (calc) (ref 1.0–2.5)
ALT: 36 U/L (ref 9–46)
AST: 22 U/L (ref 10–40)
Albumin: 4.6 g/dL (ref 3.6–5.1)
Alkaline phosphatase (APISO): 75 U/L (ref 36–130)
BUN: 13 mg/dL (ref 7–25)
CO2: 30 mmol/L (ref 20–32)
Calcium: 9.9 mg/dL (ref 8.6–10.3)
Chloride: 101 mmol/L (ref 98–110)
Creat: 1.27 mg/dL (ref 0.60–1.35)
GFR, Est African American: 76 mL/min/{1.73_m2} (ref >=60)
GFR, Est Non African American: 66 mL/min/{1.73_m2} (ref >=60)
Globulin: 2 g/dL (calc) (ref 1.9–3.7)
Glucose, Bld: 152 mg/dL — ABNORMAL HIGH (ref 65–99)
Potassium: 4.2 mmol/L (ref 3.5–5.3)
Sodium: 140 mmol/L (ref 135–146)
Total Bilirubin: 0.6 mg/dL (ref 0.2–1.2)
Total Protein: 6.6 g/dL (ref 6.1–8.1)

## 2019-11-02 LAB — CBC WITH DIFFERENTIAL/PLATELET
Absolute Monocytes: 540 cells/uL (ref 200–950)
Basophils Absolute: 53 cells/uL (ref 0–200)
Basophils Relative: 0.7 %
Eosinophils Absolute: 281 cells/uL (ref 15–500)
Eosinophils Relative: 3.7 %
HCT: 44.4 % (ref 38.5–50.0)
Hemoglobin: 15.7 g/dL (ref 13.2–17.1)
Lymphs Abs: 1596 cells/uL (ref 850–3900)
MCH: 33.1 pg — ABNORMAL HIGH (ref 27.0–33.0)
MCHC: 35.4 g/dL (ref 32.0–36.0)
MCV: 93.5 fL (ref 80.0–100.0)
MPV: 11.3 fL (ref 7.5–12.5)
Monocytes Relative: 7.1 %
Neutro Abs: 5130 cells/uL (ref 1500–7800)
Neutrophils Relative %: 67.5 %
RBC: 4.75 10*6/uL (ref 4.20–5.80)
RDW: 12.5 % (ref 11.0–15.0)
Total Lymphocyte: 21 %
WBC: 7.6 10*3/uL (ref 3.8–10.8)

## 2019-11-02 LAB — INSULIN, RANDOM: Insulin: 203.8 u[IU]/mL — ABNORMAL HIGH

## 2019-11-02 LAB — LIPID PANEL
Cholesterol: 144 mg/dL (ref <200)
HDL: 37 mg/dL — ABNORMAL LOW (ref >=40)
Non-HDL Cholesterol (Calc): 107 mg/dL (calc) (ref <130)
Total CHOL/HDL Ratio: 3.9 (calc) (ref <5.0)
Triglycerides: 406 mg/dL — ABNORMAL HIGH (ref <150)

## 2019-11-02 LAB — HEMOGLOBIN A1C
Hgb A1c MFr Bld: 5.5 % of total Hgb (ref <5.7)
Mean Plasma Glucose: 111 (calc)
eAG (mmol/L): 6.2 (calc)

## 2019-11-02 LAB — MAGNESIUM: Magnesium: 2.3 mg/dL (ref 1.5–2.5)

## 2019-11-02 LAB — VITAMIN D 25 HYDROXY (VIT D DEFICIENCY, FRACTURES): Vit D, 25-Hydroxy: 72 ng/mL (ref 30–100)

## 2019-11-02 LAB — TSH: TSH: 1.78 mIU/L (ref 0.40–4.50)

## 2019-11-04 ENCOUNTER — Other Ambulatory Visit: Payer: Self-pay | Admitting: Internal Medicine

## 2019-11-04 DIAGNOSIS — Z79899 Other long term (current) drug therapy: Secondary | ICD-10-CM

## 2019-11-05 ENCOUNTER — Encounter: Payer: Self-pay | Admitting: Internal Medicine

## 2019-11-17 ENCOUNTER — Other Ambulatory Visit: Payer: Self-pay

## 2019-11-17 ENCOUNTER — Ambulatory Visit
Admission: RE | Admit: 2019-11-17 | Discharge: 2019-11-17 | Disposition: A | Discharge status: Home / Self Care | Payer: BC Managed Care – PPO | Source: Ambulatory Visit | Attending: Otolaryngology | Admitting: Otolaryngology

## 2019-11-17 DIAGNOSIS — H918X9 Other specified hearing loss, unspecified ear: Secondary | ICD-10-CM

## 2019-11-17 DIAGNOSIS — S0540XA Penetrating wound of orbit with or without foreign body, unspecified eye, initial encounter: Secondary | ICD-10-CM

## 2019-11-17 MED ORDER — GADOBENATE DIMEGLUMINE 529 MG/ML IV SOLN
20.0000 mL | Freq: Once | INTRAVENOUS | Status: AC | PRN
  Administered 2019-11-17: 20 mL via INTRAVENOUS

## 2019-12-06 DIAGNOSIS — R220 Localized swelling, mass and lump, head: Secondary | ICD-10-CM | POA: Insufficient documentation

## 2019-12-07 ENCOUNTER — Other Ambulatory Visit: Payer: Self-pay | Admitting: Otolaryngology

## 2019-12-07 DIAGNOSIS — R220 Localized swelling, mass and lump, head: Secondary | ICD-10-CM

## 2019-12-08 NOTE — Progress Notes (Signed)
    Subjective:    Patient ID: Shane Saunders, male    DOB: 1971-12-13, 48 y.o.   MRN: [id number]  HPI       Patient is a very nice 48 yo MWM with HTN, HLD, OSA / CPAP , Pre-Diabetes and Vitamin D Deficiency who presents with a 5-7 day prodrome of painful "bumps" on the Rt posterior scalp & occiput area. Describes the discomfort as intense itching and stinging sensation.   Medication Sig  . ALPRAZolam  1 MG tablet Take 1/2-1 tablet 2 - 3 x /day ONLY if needed   . atorvastatin 80 MG tablet TAKE 1 TABLET  EVERY DAY  . buPROPion-XL 300 MG  TAKE 1 TABLET  EVERY DAY   . VITAMIN D 2000 units  Take 10,000 tablets daily.   Marland Kitchen VITAMIN B 12 tab Take 1 tablet  daily.  . cyclobenzaprine  10 MG tab Take 1/2 to 1 tablet 3 x /day as needed   . DULoxetine  60 MG cap TAKE 1 CAPSULE DAILY FOR MOOD & ANXIETY  . OTC allergy tablets Takes an  daily.  . silodosin  8 MG CAPS  Take 1 capsule at Bedtime for Prostate  . finasteride 5 MG tablet TAKE 1 TABLET  EVERY DAY FOR PROSTATE    Allergies  Allergen Reactions  . Codeine Other (See Comments)    "can's sleep"  . Effexor [Venlafaxine] Other (See Comments)    ED  . Prednisone Other (See Comments)    "can't sleep"  . Tramadol Hcl Itching  . Zinc Other (See Comments)    "stomache upset"    Past Medical History:  Diagnosis Date  . Abnormal glucose   . Borderline hypertension   . BPH (benign prostatic hypertrophy)   . Crushing injury of left elbow and forearm 07/13/2015  . Depression, major, in partial remission ([place]) 07/12/2015  . Hyperlipidemia   . Hypertension   . Hypogonadism male   . 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 Morbid obesity ([place])   . Obesity (BMI 30-39.9)   . OSA on CPAP   . Prediabetes   . Right ureteral stone   . Sleep apnea    C PAP  . Vitamin D deficiency   . Wears glasses     Review of Systems  10 point systems review negative except as above.    Objective:   Physical Exam  BP 110/66    Pulse 76   Temp (!) 97 F (36.1 C)   Resp 16   Ht 5\' 11"  (1.803 m)   Wt 257 lb 3.2 oz (116.7 kg)   BMI 35.87 kg/m   HEENT - WNL. Except few pinkish red raised hive-like bumps 1/2 - 1 cm over posterior Rt scalp Neck - supple.  Chest - Clear equal BS. Cor - Nl HS. RRR w/o sig MGR. PP 1(+). No edema. MS- FROM w/o deformities.  Gait Nl. Neuro -  Nl w/o focal abnormalities.    Assessment & Plan:   1. Herpes zoster without complication  - dexamethasone (DECADRON) 1 MG tablet; Take 1 tab 3 x day - 3 days, then 2 x day - 3 days, then 1 tab daily  Dispense: 20 tablet; Refill: 0

## 2019-12-09 ENCOUNTER — Other Ambulatory Visit: Payer: Self-pay

## 2019-12-09 ENCOUNTER — Ambulatory Visit (INDEPENDENT_AMBULATORY_CARE_PROVIDER_SITE_OTHER): Payer: BC Managed Care – PPO | Admitting: Internal Medicine

## 2019-12-09 VITALS — BP 110/66 | HR 76 | Temp 97.0°F | Resp 16 | Ht 71.0 in | Wt 257.2 lb

## 2019-12-09 DIAGNOSIS — B029 Zoster without complications: Secondary | ICD-10-CM

## 2019-12-09 NOTE — Patient Instructions (Signed)
Shingles  Shingles, which is also known as herpes zoster, is an infection that causes a painful skin rash and fluid-filled blisters. It is caused by a virus. Shingles only develops in people who:  Have had chickenpox.  Have been given a medicine to protect against chickenpox (have been vaccinated). Shingles is rare in this group. What are the causes? Shingles is caused by varicella-zoster virus (VZV). This is the same virus that causes chickenpox. After a person is exposed to VZV, the virus stays in the body in an inactive (dormant) state. Shingles develops if the virus is reactivated. This can happen many years after the first (initial) exposure to VZV. It is not known what causes this virus to be reactivated. What increases the risk? People who have had chickenpox or received the chickenpox vaccine are at risk for shingles. Shingles infection is more common in people who:  Are older than age 60.  Have a weakened disease-fighting system (immune system), such as people with: ? HIV. ? AIDS. ? Cancer.  Are taking medicines that weaken the immune system, such as transplant medicines.  Are experiencing a lot of stress. What are the signs or symptoms? Early symptoms of this condition include itching, tingling, and pain in an area on your skin. Pain may be described as burning, stabbing, or throbbing. A few days or weeks after early symptoms start, a painful red rash appears. The rash is usually on one side of the body and has a band-like or belt-like pattern. The rash eventually turns into fluid-filled blisters that break open, change into scabs, and dry up in about 2-3 weeks. At any time during the infection, you may also develop:  A fever.  Chills.  A headache.  An upset stomach. How is this diagnosed? This condition is diagnosed with a skin exam. Skin or fluid samples may be taken from the blisters before a diagnosis is made. These samples are examined under a microscope or sent to  a lab for testing. How is this treated? The rash may last for several weeks. There is not a specific cure for this condition. Your health care provider will probably prescribe medicines to help you manage pain, recover more quickly, and avoid long-term problems. Medicines may include:  Antiviral drugs.  Anti-inflammatory drugs.  Pain medicines.  Anti-itching medicines (antihistamines). If the area involved is on your face, you may be referred to a specialist, such as an eye doctor (ophthalmologist) or an ear, nose, and throat (ENT) doctor (otolaryngologist) to help you avoid eye problems, chronic pain, or disability. Follow these instructions at home: Medicines  Take over-the-counter and prescription medicines only as told by your health care provider.  Apply an anti-itch cream or numbing cream to the affected area as told by your health care provider. Relieving itching and discomfort   Apply cold, wet cloths (cold compresses) to the area of the rash or blisters as told by your health care provider.  Cool baths can be soothing. Try adding baking soda or dry oatmeal to the water to reduce itching. Do not bathe in hot water. Blister and rash care  Keep your rash covered with a loose bandage (dressing). Wear loose-fitting clothing to help ease the pain of material rubbing against the rash.  Keep your rash and blisters clean by washing the area with mild soap and cool water as told by your health care provider.  Check your rash every day for signs of infection. Check for: ? More redness, swelling, or pain. ? Fluid   or blood. ? Warmth. ? Pus or a bad smell.  Do not scratch your rash or pick at your blisters. To help avoid scratching: ? Keep your fingernails clean and cut short. ? Wear gloves or mittens while you sleep, if scratching is a problem. General instructions  Rest as told by your health care provider.  Keep all follow-up visits as told by your health care provider. This  is important.  Wash your hands often with soap and water. If soap and water are not available, use hand sanitizer. Doing this lowers your chance of getting a bacterial skin infection.  Before your blisters change into scabs, your shingles infection can cause chickenpox in people who have never had it or have never been vaccinated against it. To prevent this from happening, avoid contact with other people, especially: ? Babies. ? Pregnant women. ? Children who have eczema. ? Elderly people who have transplants. ? People who have chronic illnesses, such as cancer or AIDS. Contact a health care provider if:  Your pain is not relieved with prescribed medicines.  Your pain does not get better after the rash heals.  You have signs of infection in the rash area, such as: ? More redness, swelling, or pain around the rash. ? Fluid or blood coming from the rash. ? The rash area feeling warm to the touch. ? Pus or a bad smell coming from the rash. Get help right away if:  The rash is on your face or nose.  You have facial pain, pain around your eye area, or loss of feeling on one side of your face.  You have difficulty seeing.  You have ear pain or have ringing in your ear.  You have a loss of taste.  Your condition gets worse. Summary  Shingles, which is also known as herpes zoster, is an infection that causes a painful skin rash and fluid-filled blisters.  This condition is diagnosed with a skin exam. Skin or fluid samples may be taken from the blisters and examined before the diagnosis is made.  Keep your rash covered with a loose bandage (dressing). Wear loose-fitting clothing to help ease the pain of material rubbing against the rash.  Before your blisters change into scabs, your shingles infection can cause chickenpox in people who have never had it or have never been vaccinated against it. This information is not intended to replace advice given to you by your health care  provider. Make sure you discuss any questions you have with your health care provider. Document Released: 12/15/2005 Document Revised: 04/08/2019 Document Reviewed: 08/19/2017 Elsevier Patient Education  2020 [employer] Inc.  

## 2019-12-11 ENCOUNTER — Encounter: Payer: Self-pay | Admitting: Internal Medicine

## 2019-12-11 MED ORDER — DEXAMETHASONE 1 MG PO TABS: ORAL_TABLET | ORAL | 0 refills | Status: DC

## 2019-12-14 ENCOUNTER — Ambulatory Visit: Payer: BC Managed Care – PPO | Admitting: Internal Medicine

## 2019-12-14 ENCOUNTER — Other Ambulatory Visit: Payer: Self-pay

## 2019-12-14 VITALS — BP 116/76 | HR 80 | Temp 97.0°F | Resp 18 | Ht 71.0 in | Wt 262.2 lb

## 2019-12-14 DIAGNOSIS — R69 Illness, unspecified: Secondary | ICD-10-CM

## 2019-12-14 NOTE — Progress Notes (Signed)
This note is not being shared with the patient for the following reason: To prevent harm (release of this note would result in harm to the life or physical safety of the patient or another).   Subjective:    Patient ID: Shane Saunders, male    DOB: 22-Apr-1971, 48 y.o.   MRN: [id number]  HPI   Patient is a 48 yo MWM with hx/o sensation of dysthesias of thr Rt buccal mucosa and swelling of the lateral Rt upper lip and more recently rash of the nape of his nech . He has been seen by Dr Benjamine Mola, local ENT and was referred to [facility]- ENT - Dr Malachi Carl and also seen at [facility]. Last July he had CT soft tissue of the neck thru this office. On Nov 19, he had MR Brain per Dr Benjamine Mola and he's scheduled for MR face Trigeminal N. Per Dr Hendricks Limes on Dec 29. Patient became very upset & confrontational during today's visit stating "Nobody is listening to me or paying attention to what's bothering me". He admitted that he's been prescribed medications by the above 3 other providers and he never went to pick up the Rx's because he didn't feel like that was what he needed. He became upset and left the exam room not not allowing me to exam him.    Review of Systems     Objective:   Physical Exam        Assessment & Plan:   No Diagnosis

## 2019-12-15 ENCOUNTER — Other Ambulatory Visit: Payer: Self-pay | Admitting: Internal Medicine

## 2019-12-15 DIAGNOSIS — F320 Major depressive disorder, single episode, mild: Secondary | ICD-10-CM

## 2019-12-18 ENCOUNTER — Other Ambulatory Visit: Payer: Self-pay | Admitting: Adult Health

## 2019-12-21 ENCOUNTER — Other Ambulatory Visit: Payer: Self-pay | Admitting: Otolaryngology

## 2019-12-26 ENCOUNTER — Other Ambulatory Visit: Payer: Self-pay | Admitting: Otolaryngology

## 2019-12-27 ENCOUNTER — Other Ambulatory Visit: Payer: BC Managed Care – PPO

## 2020-01-05 ENCOUNTER — Other Ambulatory Visit: Payer: Self-pay | Admitting: Internal Medicine

## 2020-01-12 NOTE — Progress Notes (Addendum)
Subjective:    Patient ID: Shane Saunders, male    DOB: [birth year], 49 y.o.   MRN: [id number]  HPI  Patient is a 8 yo MWM with a 2 year hx/ o query "dizziness" and intermittent swelling of his Right buccal mucosa and Right lip. He's been seen by Dermatologists, ENT Doctors, Neurologists with numerous Normal studies including neck CT & and a Brain MRI, ENG's, EEG's. He presents today with c/o persistent "dizziness" or sensation od "off balance", daily headaches and intermittent swelling of the Right buccal mouth and Right lip.  Medication Sig  . ALPRAZolam (XANAX) 1 MG tablet Take 1/2  to 1 tablet    2 to 3   x /day & please try to limit to 5 days /week to avoid Addiction or Dementia  . atorvastatin (LIPITOR) 80 MG tablet TAKE 1 TABLET BY MOUTH EVERY DAY  . buPROPion (WELLBUTRIN XL) 300 MG 24 hr tablet Take 1 tablet every Morning for Mood, Focus & Concentration  . Cholecalciferol (VITAMIN D3) 2000 units TABS Take 10,000 tablets by mouth daily.   . Cyanocobalamin (VITAMIN B 12 PO) Take 1 tablet by mouth daily.  . cyclobenzaprine (FLEXERIL) 10 MG tablet Take 1/2 to 1 tablet 3 x /day as needed for Muscle Spasm  . dexamethasone (DECADRON) 1 MG tablet Take 1 tab 3 x day - 3 days, then 2 x day - 3 days, then 1 tab daily  . doxycycline (VIBRA-TABS) 100 MG tablet Take 1 tablet by mouth daily.  . DULoxetine (CYMBALTA) 60 MG capsule TAKE 1 CAPSULE DAILY FOR MOOD & ANXIETY  . gabapentin (NEURONTIN) 300 MG capsule START WITH 1 CAPSULE (300 MG) AT NIGHT X 1 WEEK THEN GO TO 2 CAPSULE (600 MG) AT NIGHT.  Marland Kitchen OVER THE COUNTER MEDICATION Takes an OTC allergy tablets daily.  . silodosin (RAPAFLO) 8 MG CAPS capsule Take 1 capsule at Bedtime for Prostate  . valACYclovir (VALTREX) 1000 MG tablet Take 1,000 mg by mouth 2 (two) times daily.   Past Medical History:  Diagnosis Date  . Abnormal glucose   . Borderline hypertension   . BPH (benign prostatic hypertrophy)   . Crushing injury of left elbow and forearm  07/13/2015  . Depression, major, in partial remission ([place]) 07/12/2015  . Hyperlipidemia   . Hypertension   . Hypogonadism male   . 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 Morbid obesity ([place])   . Obesity (BMI 30-39.9)   . OSA on CPAP   . Prediabetes   . Right ureteral stone   . Sleep apnea    C PAP  . Vitamin D deficiency   . Wears glasses    Past Surgical History:  Procedure Laterality Date  . 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 ARTERY REPAIR Right 05/31/2014   Procedure: IRRIGATION, EXPLORATION, AND REPAIR OF RIGHT ARM WOUND;  Surgeon: Gwenyth Ober, MD;  Location: [place];  Service: General;  Laterality: Right;  . CYSTOSCOPY WITH RETROGRADE PYELOGRAM, URETEROSCOPY AND STENT PLACEMENT Right 07/11/2016   Procedure: CYSTOSCOPY WITH RETROGRADE PYELOGRAM, URETEROSCOPY, BASKET STONE EXTRACTION  AND STENT PLACEMENT;  Surgeon: Alexis Frock, MD;  Location: [facility];  Service: Urology;  Laterality: Right;  45 MINS  C-ARM DIGITAL URETEROSCOPE HOLMIUM LASER  . ESOPHAGOGASTRODUODENOSCOPY     Review of Systems    10 point systems review negative except as  above.    Objective:   Physical Exam  Sit BP 114/72    & Stand BP 106/72   Pulse 88   Temp 97.6 F (36.4 C)   Resp 16   Ht '5\' 11"'$  (1.803 m)   Wt 261 lb 3.2 oz (118.5 kg)   BMI 36.43 kg/m   HEENT - WNL. Neck - supple.  Chest - Clear equal BS. Cor - Nl HS. RRR w/o sig MGR. PP 1(+). No edema. MS- FROM w/o deformities.  Gait Nl. Neuro -  Nl w/o focal abnormalities.          Assessment & Plan:   1. Vertigo  2. Idiopathic angioedema, sequela  - C1 Esterase Inhibitor, Functional - CBC with Diff - Sedimentation rate - Angiotensin converting enzyme - C-reactive protein - HLA-B27 Antigen - Anti-Smith antibody - C3 and C4 - Complement, total - ANCA screen with reflex titer  3. Vasculitis  (HCC)  - C1 Esterase Inhibitor, Functional - CBC with Diff - Sedimentation rate - Angiotensin converting enzyme - C-reactive protein - HLA-B27 Antigen - Anti-Smith antibody - C3 and C4 - Complement, total - ANCA screen with reflex titer  4. Medication management  - C1 Esterase Inhibitor, Functional - CBC with Diff - Sedimentation rate - Angiotensin converting enzyme - C-reactive protein - HLA-B27 Antigen - Anti-Smith antibody - C3 and C4 - Complement, total - ANCA screen with reflex titer  - Discussed with patient tapering Duloxetine & Bupropion to qod for the next 10 days then stop & return in about 3 weeks to re-assess

## 2020-01-13 ENCOUNTER — Ambulatory Visit: Payer: BC Managed Care – PPO | Admitting: Internal Medicine

## 2020-01-13 ENCOUNTER — Other Ambulatory Visit: Payer: Self-pay

## 2020-01-13 VITALS — BP 114/72 | HR 88 | Temp 97.6°F | Resp 16 | Ht 71.0 in | Wt 261.2 lb

## 2020-01-13 DIAGNOSIS — I776 Arteritis, unspecified: Secondary | ICD-10-CM

## 2020-01-13 DIAGNOSIS — Z79899 Other long term (current) drug therapy: Secondary | ICD-10-CM

## 2020-01-13 DIAGNOSIS — T783XXS Angioneurotic edema, sequela: Secondary | ICD-10-CM

## 2020-01-13 DIAGNOSIS — R42 Dizziness and giddiness: Secondary | ICD-10-CM | POA: Diagnosis not present

## 2020-01-14 ENCOUNTER — Encounter: Payer: Self-pay | Admitting: Internal Medicine

## 2020-01-14 LAB — CBC WITH DIFFERENTIAL/PLATELET
Absolute Monocytes: 836 cells/uL (ref 200–950)
Basophils Absolute: 52 cells/uL (ref 0–200)
Basophils Relative: 0.7 %
Eosinophils Absolute: 348 cells/uL (ref 15–500)
Eosinophils Relative: 4.7 %
HCT: 46.1 % (ref 38.5–50.0)
Hemoglobin: 15.9 g/dL (ref 13.2–17.1)
Lymphs Abs: 1384 cells/uL (ref 850–3900)
MCH: 33.1 pg — ABNORMAL HIGH (ref 27.0–33.0)
MCHC: 34.5 g/dL (ref 32.0–36.0)
MCV: 95.8 fL (ref 80.0–100.0)
MPV: 10.8 fL (ref 7.5–12.5)
Monocytes Relative: 11.3 %
Neutro Abs: 4780 cells/uL (ref 1500–7800)
Neutrophils Relative %: 64.6 %
Platelets: 82 10*3/uL — ABNORMAL LOW (ref 140–400)
RBC: 4.81 10*6/uL (ref 4.20–5.80)
RDW: 13.4 % (ref 11.0–15.0)
Total Lymphocyte: 18.7 %
WBC: 7.4 10*3/uL (ref 3.8–10.8)

## 2020-01-14 LAB — SEDIMENTATION RATE: Sed Rate: 2 mm/h (ref 0–15)

## 2020-01-15 ENCOUNTER — Other Ambulatory Visit: Payer: Self-pay | Admitting: Adult Health

## 2020-01-19 LAB — C3 AND C4
C3 Complement: 132 mg/dL (ref 82–185)
C4 Complement: 33 mg/dL (ref 15–53)

## 2020-01-19 LAB — C1 ESTERASE INHIBITOR, FUNCTIONAL: C1 Esterase Inhibitor Funct: 75 % (ref >=68)

## 2020-01-19 LAB — COMPLEMENT, TOTAL: Compl, Total (CH50): >60 U/mL — ABNORMAL HIGH (ref 31–60)

## 2020-01-19 LAB — ANTI-SMITH ANTIBODY: ENA SM Ab Ser-aCnc: <1 AI

## 2020-01-19 LAB — ANGIOTENSIN CONVERTING ENZYME: Angiotensin-Converting Enzyme: 42 U/L (ref 9–67)

## 2020-01-19 LAB — ANCA SCREEN W REFLEX TITER: ANCA Screen: NEGATIVE

## 2020-01-19 LAB — C-REACTIVE PROTEIN: CRP: 1.1 mg/L (ref <8.0)

## 2020-01-19 LAB — HLA-B27 ANTIGEN: HLA-B27 Antigen: NEGATIVE

## 2020-01-31 NOTE — Progress Notes (Signed)
Subjective:    Patient ID: Shane Saunders, male    DOB: 1971-12-13, 49 y.o.   MRN: [id number]  HPI    This nice 49 yo MWM with ho/o 2 year intermittent swelling of the right lip & buccal mucosa has been seen by numerous specialists for evaluation  (Dermatologists, ENT Doctors, Neurologists - [place] & [facility] with numerous Normal studies including neck CT & and a Brain MRI, ENG's, EEG's). Had a slightly low B12 = 425 and is only taking oral B supplement      Was dx'd with folliculitis of nape of neck by Dermatologist & concerned that tender cystic lesions not resolved after a month on Doxycycline bid. Has hx/o OSA & report his CPAP machine not working & desires to be retested.       He had negative labs 2 weeks ago for CBC, ESR, hsCRP, ANCA, total Compliment and C3 & C4, Anti-Smith, HLA-B 27 Ag, Angiotensin converting enzyme and C1 Esterase Inhibitor. He was advised to taper off of his Duloxetine & Bupropion over 10 days and returns now for 2 week f/u to evaluate his c/o of "dizziness", poor coordination / hand dexterity,  "imbalance" and dysphoria.       Patient reports over the last 2 weeks as his frustration mounts and tapering off of the Wellbutrin & Cymbalta that he's becoming more irritable & "short-fused".  Altho he is reluctant to take an new meds. He feels that his coordination, dexterity sx's have not improved. He does admit difficulty focusing  and concentrating .   Medication Sig  . ALPRAZolam (XANAX) 1 MG tablet Take 1/2  to 1 tablet    2 to 3   x /day & please try to limit to 5 days /week to avoid Addiction or Dementia  . atorvastatin (LIPITOR) 80 MG tablet TAKE 1 TABLET BY MOUTH EVERY DAY  . Marland Kitchen Cholecalciferol (VITAMIN D3) 2000 units TABS Take 10,000 tablets by mouth daily.   . Cyanocobalamin (VITAMIN B 12 PO) Take 1 tablet by mouth daily.  . . . finasteride (PROSCAR) 5 MG tablet Take 1 tablet Daily for Prostate  . gabapentin (NEURONTIN) 300 MG capsule START WITH 1 CAPSULE (300 MG)  AT NIGHT X 1 WEEK THEN GO TO 2 CAPSULE (600 MG) AT NIGHT.  . OTC allergy tablets Takes  daily.  . silodosin (RAPAFLO) 8 MG CAPS capsule Take 1 capsule at Bedtime for Prostate  .   Allergies  Allergen Reactions  . Codeine Other (See Comments)    "can's sleep"  . Effexor [Venlafaxine] Other (See Comments)    ED  . Prednisone Other (See Comments)    "can't sleep"  . Tramadol Hcl Itching  . Zinc Other (See Comments)    "stomache upset"   Past Medical History:  Diagnosis Date  . Abnormal glucose   . Borderline hypertension   . BPH (benign prostatic hypertrophy)   . Crushing injury of left elbow and forearm 07/13/2015  . Depression, major, in partial remission ([place]) 07/12/2015  . Hyperlipidemia   . Hypertension   . Hypogonadism male   . 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 Morbid obesity ([place])   . Obesity (BMI 30-39.9)   . OSA on CPAP   . Prediabetes   . Right ureteral stone   . Sleep apnea    C PAP  . Vitamin D deficiency   . Wears glasses    Past Surgical History:  Procedure Laterality Date  . 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 ARTERY REPAIR Right 05/31/2014   Procedure: IRRIGATION, EXPLORATION, AND REPAIR OF RIGHT ARM WOUND;  Surgeon: James O Wyatt, MD;  Location: [facility];  Service: General;  Laterality: Right;  . CYSTOSCOPY WITH RETROGRADE PYELOGRAM, URETEROSCOPY AND STENT PLACEMENT Right 07/11/2016   Procedure: CYSTOSCOPY WITH RETROGRADE PYELOGRAM, URETEROSCOPY, BASKET STONE EXTRACTION  AND STENT PLACEMENT;  Surgeon: Theodore Manny, MD;  Location: [facility];  Service: Urology;  Laterality: Right;  45 MINS  C-ARM DIGITAL URETEROSCOPE HOLMIUM LASER  . ESOPHAGOGASTRODUODENOSCOPY      Review of Systems    10 point systems review negative except as above.     Objective:   Physical Exam  BP 116/84   Pulse 88   Temp (!) 97.4 F (36.3 C)   Resp 18    Ht 5' 11" (1.803 m)   Wt 270 lb 3.2 oz (122.6 kg)   BMI 37.69 kg/m   Postural          Sitting     BP 119/57   P 77         &         Standing    BP 133/70     P 78   HEENT - WNL. Neck - supple.  Chest - Clear equal BS. Cor - Nl HS. RRR w/o sig MGR. PP 1(+). No edema. MS- FROM w/o deformities.  Gait sl broad based.. Neuro -  Nl w/o focal abnormalities. (+) Romberg with eyes closed. Poor tandem walk esp w/eyes closed.  F->N equivocal Nl. DTR's flat.  Skin- tender cystic acne nape of neck and occipital scalp    Assessment & Plan:   1. Vertigo  - Ambulatory referral to Neurology  2. Unstable gait  - concern this may reflect posterior column Disease, may need PNCV's   - Ambulatory referral to Neurology  - Vitamin B12; Future - Methylmalonic acid, serum; Future - Heavy Metals Panel, Blood; Future - Heavy Metals Profile, Urine; Future  3. Disease related peripheral neuropathy  - Ambulatory referral to Neurology  - Vitamin B12; Future - Methylmalonic acid, serum; Future - Heavy Metals Panel, Blood; Future - Heavy Metals Profile, Urine; Future  4. OSA on CPAP  - Ambulatory referral to Neurology for re-evaluation  5. Attention deficit hyperactivity disorder (ADHD), predominantly inattentive type  - Will try Ritalin to see if helps focus & concentration - methylphenidate (RITALIN) 10 MG tablet; Take 1/2 to 1 tablet 2 x /day for Focus & Concentration  Dispense: 60 tablet; Refill: 0  6. Folliculitis  - cephALEXin (KEFLEX) 500 MG capsule; Take 1 capsule 3 x  /day with Meals for Skin  Infection  Dispense: 100 capsule; Refill: 0  7. Cystic acne  - cephALEXin (KEFLEX) 500 MG capsule; Take 1 capsule 3 x  /day with Meals for Skin  Infection  Dispense: 100 capsule; Refill: 0  Between 45-50  minutes of counseling, chart review, Exam and critical decision making was performed  

## 2020-02-01 ENCOUNTER — Ambulatory Visit (INDEPENDENT_AMBULATORY_CARE_PROVIDER_SITE_OTHER): Payer: BC Managed Care – PPO | Admitting: Internal Medicine

## 2020-02-01 ENCOUNTER — Other Ambulatory Visit: Payer: Self-pay

## 2020-02-01 ENCOUNTER — Encounter: Payer: Self-pay | Admitting: Internal Medicine

## 2020-02-01 VITALS — BP 116/84 | HR 88 | Temp 97.4°F | Resp 18 | Ht 71.0 in | Wt 270.2 lb

## 2020-02-01 DIAGNOSIS — R42 Dizziness and giddiness: Secondary | ICD-10-CM | POA: Diagnosis not present

## 2020-02-01 DIAGNOSIS — R2681 Unsteadiness on feet: Secondary | ICD-10-CM | POA: Diagnosis not present

## 2020-02-01 DIAGNOSIS — G4733 Obstructive sleep apnea (adult) (pediatric): Secondary | ICD-10-CM | POA: Diagnosis not present

## 2020-02-01 DIAGNOSIS — G6289 Other specified polyneuropathies: Secondary | ICD-10-CM

## 2020-02-01 DIAGNOSIS — L739 Follicular disorder, unspecified: Secondary | ICD-10-CM

## 2020-02-01 DIAGNOSIS — Z9989 Dependence on other enabling machines and devices: Secondary | ICD-10-CM

## 2020-02-01 DIAGNOSIS — L700 Acne vulgaris: Secondary | ICD-10-CM

## 2020-02-01 DIAGNOSIS — F900 Attention-deficit hyperactivity disorder, predominantly inattentive type: Secondary | ICD-10-CM

## 2020-02-01 MED ORDER — CEPHALEXIN 500 MG PO CAPS: ORAL_CAPSULE | ORAL | 0 refills | Status: DC

## 2020-02-01 MED ORDER — METHYLPHENIDATE HCL 10 MG PO TABS: ORAL_TABLET | ORAL | 0 refills | Status: DC

## 2020-02-02 ENCOUNTER — Other Ambulatory Visit: Payer: BC Managed Care – PPO

## 2020-02-02 NOTE — Addendum Note (Signed)
Addended by: Emerson Monte on: 02/02/2020 03:39 PM   Modules accepted: Orders

## 2020-02-03 LAB — VITAMIN B12: Vitamin B-12: 857 pg/mL (ref 200–1100)

## 2020-02-06 ENCOUNTER — Encounter: Payer: Self-pay | Admitting: Diagnostic Neuroimaging

## 2020-02-06 ENCOUNTER — Ambulatory Visit: Payer: BC Managed Care – PPO | Admitting: Diagnostic Neuroimaging

## 2020-02-06 ENCOUNTER — Other Ambulatory Visit: Payer: Self-pay

## 2020-02-06 VITALS — BP 118/80 | HR 82 | Temp 97.4°F | Ht 70.0 in | Wt 273.0 lb

## 2020-02-06 DIAGNOSIS — F488 Other specified nonpsychotic mental disorders: Secondary | ICD-10-CM

## 2020-02-06 DIAGNOSIS — R269 Unspecified abnormalities of gait and mobility: Secondary | ICD-10-CM | POA: Diagnosis not present

## 2020-02-06 DIAGNOSIS — R220 Localized swelling, mass and lump, head: Secondary | ICD-10-CM

## 2020-02-06 DIAGNOSIS — R42 Dizziness and giddiness: Secondary | ICD-10-CM | POA: Diagnosis not present

## 2020-02-06 DIAGNOSIS — R4189 Other symptoms and signs involving cognitive functions and awareness: Secondary | ICD-10-CM

## 2020-02-06 LAB — HEAVY METALS PANEL, BLOOD
Arsenic: <10 mcg/L (ref <23)
Lead: 1 ug/dL (ref <5)
Mercury, B: <5 mcg/L (ref 0–10)

## 2020-02-06 LAB — METHYLMALONIC ACID, SERUM: Methylmalonic Acid, Quant: 143 nmol/L (ref 87–318)

## 2020-02-06 NOTE — Progress Notes (Signed)
[facility]  PATIENT: Shane Saunders DOB: 30-Dec-1970  REFERRING CLINICIAN: [name], MD HISTORY FROM: patient and wife  REASON FOR VISIT: new consult    HISTORICAL  CHIEF COMPLAINT:  Chief Complaint  Patient presents with  . Peripheral Neuropathy    rm 7 New Pt wife- Angelica Chessman "vertigo, gail issues, peripheral neuropathy x 2 years"  . Gait Problem    HISTORY OF PRESENT ILLNESS:   49 year old male here for evaluation of dizziness and gait difficulty.  2 years ago patient had onset of dizziness and intermittent right lip swelling.  His dizziness is described as feeling off balance, lightheadedness, brain fog and fatigue.  He denies any spinning or vertigo sensations.  However he did follow-up with ENT, apparently had some vestibular testing showing some dysfunction.  He underwent vestibular therapy exercises without relief.  Also was having intermittent right upper lip swelling attacks lasting a few hours at a time and now more consistent.  He went to ENT and dermatology for evaluation without specific diagnosis.  Allergy immunology consult was considered but has not been pursued yet.  He had variety of testing for autoimmune inflammatory etiologies which have been unremarkable.  Patient also has history of depression, on medication in the past.  These medications have been weaned off to see if this would help improve symptoms but unfortunately symptoms have continued.  Patient has history of sleep apnea on CPAP, with testing from 12 years ago.  He has a sleep consult pending.  Patient has some low back pain issues but denies any numbness or tingling in his feet or legs.   REVIEW OF SYSTEMS: Full 14 system review of systems performed and negative with exception of: As per HPI.   ALLERGIES: Allergies  Allergen Reactions  . Codeine Other (See Comments)    "can's sleep"  . Effexor [Venlafaxine] Other (See Comments)    ED  . Prednisone Other (See  Comments)    "can't sleep"  . Tramadol Hcl Itching  . Zinc Other (See Comments)    "stomache upset"    HOME MEDICATIONS: Outpatient Medications Prior to Visit  Medication Sig Dispense Refill  . ALPRAZolam (XANAX) 1 MG tablet Take 1/2  to 1 tablet    2 to 3   x /day & please try to limit to 5 days /week to avoid Addiction or Dementia 60 tablet 0  . atorvastatin (LIPITOR) 80 MG tablet TAKE 1 TABLET BY MOUTH EVERY DAY 90 tablet 1  . cephALEXin (KEFLEX) 500 MG capsule Take 1 capsule 3 x  /day with Meals for Skin  Infection 100 capsule 0  . Cholecalciferol (VITAMIN D3) 2000 units TABS Take 10,000 tablets by mouth daily.     . Cyanocobalamin (VITAMIN B 12 PO) Take 1 tablet by mouth daily.    Marland Kitchen gabapentin (NEURONTIN) 300 MG capsule START WITH 1 CAPSULE (300 MG) AT NIGHT X 1 WEEK THEN GO TO 2 CAPSULE (600 MG) AT NIGHT.    Marland Kitchen OVER THE COUNTER MEDICATION Takes an OTC allergy tablets daily.    . silodosin (RAPAFLO) 8 MG CAPS capsule Take 1 capsule at Bedtime for Prostate 90 capsule 3  . finasteride (PROSCAR) 5 MG tablet Take 1 tablet Daily for Prostate (Patient not taking: Reported on 02/01/2020) 90 tablet 1  . methylphenidate (RITALIN) 10 MG tablet Take 1/2 to 1 tablet 2 x /day for Focus & Concentration (Patient not taking: Reported on 02/06/2020) 60 tablet 0   No facility-administered medications prior to visit.  PAST MEDICAL HISTORY: Past Medical History:  Diagnosis Date  . Abnormal glucose   . Borderline hypertension   . BPH (benign prostatic hypertrophy)   . Crushing injury of left elbow and forearm 07/13/2015  . 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 Dizziness   . Hyperlipidemia   . Hypertension   . Hypogonadism male   . 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 Morbid obesity ([facility])   . Obesity (BMI 30-39.9)   . OSA on CPAP   . Prediabetes   . Right ureteral stone   . Sleep apnea    C PAP  . Vertigo   . Vitamin D deficiency   . Wears  glasses     PAST SURGICAL HISTORY: Past Surgical History:  Procedure Laterality Date  . 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 ARTERY REPAIR Right 05/31/2014   Procedure: IRRIGATION, EXPLORATION, AND REPAIR OF RIGHT ARM WOUND;  Surgeon: Cherylynn Ridges, MD;  Location: [facility] OR;  Service: General;  Laterality: Right;  . CYSTOSCOPY WITH RETROGRADE PYELOGRAM, URETEROSCOPY AND STENT PLACEMENT Right 07/11/2016   Procedure: CYSTOSCOPY WITH RETROGRADE PYELOGRAM, URETEROSCOPY, BASKET STONE EXTRACTION  AND STENT PLACEMENT;  Surgeon: Sebastian Ache, MD;  Location: [facility];  Service: Urology;  Laterality: Right;  45 MINS  C-ARM DIGITAL URETEROSCOPE HOLMIUM LASER  . ESOPHAGOGASTRODUODENOSCOPY      FAMILY HISTORY: Family History  Problem Relation Age of Onset  . Hypertension Mother   . Migraines Mother   . Thyroid disease Mother   . Hypertension Father   . Hyperlipidemia Father   . Diabetes Father   . Cancer Sister        thyroid  . CAD Paternal Grandfather     SOCIAL HISTORY: Social History   Socioeconomic History  . Marital status: Married    Spouse name: Angelica Chessman  . Number of children: 2  . Years of education: 21  . Highest education level: Not on file  Occupational History    Comment: na  Tobacco Use  . Smoking status: Former Smoker    Packs/day: 0.50    Years: 10.00    Pack years: 5.00    Types: Cigarettes    Quit date: 10/27/2009    Years since quitting: 10.2  . Smokeless tobacco: Former Neurosurgeon    Types: Snuff  Substance and Sexual Activity  . Alcohol use: Yes    Comment: occasional  . Drug use: No  . Sexual activity: Yes    Comment: Vasectomy  Other Topics Concern  . Not on file  Social History Narrative   Lives with wife   Caffeine -tea, 1 daily       Social Determinants of Health   Financial Resource Strain:   . Difficulty of Paying Living Expenses: Not on file  Food  Insecurity:   . Worried About Programme researcher, broadcasting/film/video in the Last Year: Not on file  . Ran Out of Food in the Last Year: Not on file  Transportation Needs:   . Lack of Transportation (Medical): Not on file  . Lack of Transportation (Non-Medical): Not on file  Physical Activity:   . Days of Exercise per Week: Not on file  . Minutes of Exercise per Session: Not on file  Stress:   . Feeling of Stress : Not on file  Social Connections:   . Frequency of Communication  with Friends and Family: Not on file  . Frequency of Social Gatherings with Friends and Family: Not on file  . Attends Religious Services: Not on file  . Active Member of Clubs or Organizations: Not on file  . Attends Archivist Meetings: Not on file  . Marital Status: Not on file  Intimate Partner Violence:   . Fear of Current or Ex-Partner: Not on file  . Emotionally Abused: Not on file  . Physically Abused: Not on file  . Sexually Abused: Not on file     PHYSICAL EXAM  GENERAL EXAM/CONSTITUTIONAL: Vitals:  Vitals:   02/06/20 1540  BP: 118/80  Pulse: 82  Temp: (!) 97.4 F (36.3 C)  Weight: 273 lb (123.8 kg)  Height: 5\' 10"  (1.778 m)     Body mass index is 39.17 kg/m. Wt Readings from Last 3 Encounters:  02/06/20 273 lb (123.8 kg)  02/01/20 270 lb 3.2 oz (122.6 kg)  01/13/20 261 lb 3.2 oz (118.5 kg)     Patient is in no distress; well developed, nourished and groomed; neck is supple  CARDIOVASCULAR:  Examination of carotid arteries is normal; no carotid bruits  Regular rate and rhythm, no murmurs  Examination of peripheral vascular system by observation and palpation is normal  EYES:  Ophthalmoscopic exam of optic discs and posterior segments is normal; no papilledema or hemorrhages  No exam data present  MUSCULOSKELETAL:  Gait, strength, tone, movements noted in Neurologic exam below  NEUROLOGIC: MENTAL STATUS:  No flowsheet data found.  awake, alert, oriented to person, place  and time  recent and remote memory intact  normal attention and concentration  language fluent, comprehension intact, naming intact  fund of knowledge appropriate  CRANIAL NERVE:   2nd - no papilledema on fundoscopic exam  2nd, 3rd, 4th, 6th - pupils equal and reactive to light, visual fields full to confrontation, extraocular muscles intact, no nystagmus  5th - facial sensation symmetric  7th - facial strength symmetric  8th - hearing intact  9th - palate elevates symmetrically, uvula midline  11th - shoulder shrug symmetric  12th - tongue protrusion midline  MOTOR:   normal bulk and tone, full strength in the BUE, BLE  SENSORY:   normal and symmetric to light touch, temperature, vibration  COORDINATION:   finger-nose-finger, fine finger movements normal  REFLEXES:   deep tendon reflexes TRACE and symmetric  GAIT/STATION:   narrow based gait; DIFF WITH TANDEM; romberg is negative     DIAGNOSTIC DATA (LABS, IMAGING, TESTING) - I reviewed patient records, labs, notes, testing and imaging myself where available.  Lab Results  Component Value Date   WBC 7.4 01/13/2020   HGB 15.9 01/13/2020   HCT 46.1 01/13/2020   MCV 95.8 01/13/2020   PLT 82 (L) 01/13/2020      Component Value Date/Time   NA 140 11/01/2019 1623   NA 140 05/25/2018 0941   K 4.2 11/01/2019 1623   CL 101 11/01/2019 1623   CO2 30 11/01/2019 1623   GLUCOSE 152 (H) 11/01/2019 1623   BUN 13 11/01/2019 1623   BUN 10 05/25/2018 0941   CREATININE 1.27 11/01/2019 1623   CALCIUM 9.9 11/01/2019 1623   PROT 6.6 11/01/2019 1623   ALBUMIN 4.5 05/27/2017 1622   AST 22 11/01/2019 1623   ALT 36 11/01/2019 1623   ALKPHOS 79 05/27/2017 1622   BILITOT 0.6 11/01/2019 1623   GFRNONAA 66 11/01/2019 1623   GFRAA 76 11/01/2019 1623   Lab Results  Component Value Date   CHOL 144 11/01/2019   HDL 37 (L) 11/01/2019   LDLCALC  11/01/2019     Comment:     . LDL cholesterol not calculated.  Triglyceride levels greater than 400 mg/dL invalidate calculated LDL results. . Reference range: <100 . Desirable range <100 mg/dL for primary prevention;   <70 mg/dL for patients with CHD or diabetic patients  with > or = 2 CHD risk factors. Marland Kitchen LDL-C is now calculated using the Martin-Hopkins  calculation, which is a validated novel method providing  better accuracy than the Friedewald equation in the  estimation of LDL-C.  Horald Pollen et al. Lenox Ahr. 7858;850(27): 2061-2068  (http://education.[facility].com/faq/FAQ164)    TRIG 406 (H) 11/01/2019   CHOLHDL 3.9 11/01/2019   Lab Results  Component Value Date   HGBA1C 5.5 11/01/2019   Lab Results  Component Value Date   VITAMINB12 857 02/02/2020   Lab Results  Component Value Date   TSH 1.78 11/01/2019     11/17/19 MRI brain - Negative for vestibular schwannoma. No cause for hearing loss identified. 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 Otherwise normal MRI brain with contrast.    ASSESSMENT AND PLAN  49 y.o. year old male here with constellation of symptoms including:  Dx:  1. Gait difficulty   2. Lightheadedness   3. Lip swelling   4. Brain fog     PLAN:   GAIT DIFFICULTY / LOW BACK PAIN  - recommend PT evaluation, optimize nutrition, exercise, sleep - may consider MRI lumbar spine and EMG/NCS in future; likely low yield as neuro exam is unremarkable  LIGHTHEADEDNESS (positional / exertional) - follow up with cardiology / PCP - follow up with sleep study  BRAIN FOG / ANXIETY / DEPRESSION - follow up PCP and psychiatry  INTERMITTENT LIP SWELLING  - ? hereditary angioedema, melkersson-rosenthal or other autoimmune process - follow up with PCP; consider allergy/immunology  Return for pending if symptoms worsen or fail to improve, return to PCP.    Suanne Marker, MD 02/06/2020, 4:09 PM Certified in Neurology, Neurophysiology and  Neuroimaging  [facility] [address], [address] [place], [place] [address] [phone]

## 2020-02-06 NOTE — Patient Instructions (Addendum)
INTERMITTENT LIP SWELLING  - ? hereditary angioedema, melkersson-rosenthal or other autoimmune process - follow up with dermatology or allergy/immunology  LIGHTHEADEDNESS (positional / exertional) - follow up with cardiology / PCP - follow up with sleep study  GAIT DIFFICULTY / LOW BACK PAIN  - consider MRI lumbar spine - consider EMG/NCS  BRAIN FOG / ANXIETY / DEPRESSION - follow up PCP; consider psychiatry follow up

## 2020-02-07 ENCOUNTER — Ambulatory Visit: Payer: BC Managed Care – PPO | Admitting: Adult Health Nurse Practitioner

## 2020-02-08 LAB — HEAVY METALS PROFILE, URINE
Arsenic, 24H Ur: <10 mcg/L (ref <=80)
Lead, 24 hr urine: <10 mcg/L (ref <80)
Mercury, 24H Ur: <4 mcg/L (ref <=20)

## 2020-02-13 ENCOUNTER — Encounter: Payer: Self-pay | Admitting: Neurology

## 2020-02-13 ENCOUNTER — Other Ambulatory Visit: Payer: Self-pay

## 2020-02-13 ENCOUNTER — Ambulatory Visit: Payer: BC Managed Care – PPO | Admitting: Neurology

## 2020-02-13 VITALS — BP 114/72 | HR 75 | Temp 97.7°F | Ht 70.0 in | Wt 270.0 lb

## 2020-02-13 DIAGNOSIS — G4733 Obstructive sleep apnea (adult) (pediatric): Secondary | ICD-10-CM

## 2020-02-13 DIAGNOSIS — F3341 Major depressive disorder, recurrent, in partial remission: Secondary | ICD-10-CM | POA: Diagnosis not present

## 2020-02-13 DIAGNOSIS — R7309 Other abnormal glucose: Secondary | ICD-10-CM

## 2020-02-13 DIAGNOSIS — I10 Essential (primary) hypertension: Secondary | ICD-10-CM

## 2020-02-13 DIAGNOSIS — R5382 Chronic fatigue, unspecified: Secondary | ICD-10-CM | POA: Insufficient documentation

## 2020-02-13 DIAGNOSIS — Z9989 Dependence on other enabling machines and devices: Secondary | ICD-10-CM

## 2020-02-13 DIAGNOSIS — G4719 Other hypersomnia: Secondary | ICD-10-CM | POA: Insufficient documentation

## 2020-02-13 NOTE — Patient Instructions (Signed)

## 2020-02-13 NOTE — Progress Notes (Signed)
  SLEEP MEDICINE CLINIC    Provider:    , MD  Primary Care Physician:  McKeown, William, MD [address] [place] [address]     Referring Provider: Mckeown, William, Md [address] [place],  [place] [address]          Chief Complaint according to patient   Patient presents with:    . New Patient (Initial Visit)           HISTORY OF PRESENT ILLNESS:  Shane Saunders is a 49 y.o. Caucasian male patient seen here upon  referral on 02/13/2020 from Dr McKeow. Chief concern according to patient :   ' I am frustrated , 2 years of dizziness - and my CPAP needs to be replaced"   I have the pleasure of seeing Shane Saunders today, a right -handed White or Caucasian male with OSA sleep disorder.  She has a  has a past medical history of Abnormal glucose, Borderline hypertension, BPH (benign prostatic hypertrophy), Crushing injury of left elbow and forearm (07/13/2015), Depression, major, in partial remission (HCC) (07/12/2015), Dizziness, Hyperlipidemia, Hypertension, Hypogonadism male, Malrotation of colon, Morbid obesity (HCC), Obesity (BMI 30-39.9), OSA on CPAP, Prediabetes, Right ureteral stone, Sleep apnea, Vertigo, Vitamin D deficiency, and Wears glasses.   2 years ago patient had onset of dizziness and intermittent right lip swelling.  His dizziness is described as feeling off balance, lightheadedness, brain fog and fatigue.  He denies any spinning or vertigo sensations.  However he did follow-up with ENT, apparently had some vestibular testing showing some dysfunction.  He underwent vestibular therapy exercises without relief. Dr Penumalli order imaging studies.  Also was having intermittent right upper lip swelling attacks lasting a few hours at a time and now more consistent.  He went to ENT and dermatology for evaluation without specific diagnosis.  Allergy immunology consult was considered but has not been pursued yet.  He had  variety of testing for autoimmune inflammatory etiologies which have been unremarkable. Patient also has history of depression, on medication in the past.  These medications have been weaned off to see if this would help improve symptoms but unfortunately symptoms have continued. Patient has some low back pain issues but denies any numbness or tingling in his feet or legs.  He has been referred for a new CPAP- his wife has recorded him stopping to breath for 27 seconds a long time, snores loudly- just this last weekend his house lost power and his sleep was miserable.   The patient had the first sleep study in the year 2007 at [facility], with a result of severe OSA and loud snoring.  His compliance data for his S9 Elite ResMed CPAP showed 97% of days and 93% 4 hours nightly.  His average user time is 8 hours and 2 minutes which is very good.  His AHI residual apnea and hypopnea index was 3.3/h of these obstructive apneas very few centrals.  He does have an air leak at 17.7 L/min so his mask may not be the best fitting for him.  The serial number of the current machine is [id number].  Even while using  CPAP he endorsed the Epworth sleepiness score at 19 out of 24 points which is a very high scale.  His fatigue severity scale is equally high at 59 out of 63. He has frequent nasal CONGESTION, SINUSITIS, COUGHIN and PHLEGM-    Family medical /sleep history: Father with OSA.      Social history:  Patient is married for 17 years, 2 children, working as a welder- and doesn't use repo iratory protection, only eye protection.  and lives in a household with 4 persons.  The patient currently doesn't work- was lad off after 31 years - Pets are not present . Tobacco use; quit 2010.  ETOH use; rarely, Caffeine intake in form of Coffee( none) Soda( none) Tea ( with lunch or dinner outside-) and no energy drinks. Regular exercise: weight / swimming - not now- lost weight 253 ..     Sleep habits are as follows: The  patient's dinner time is between 5.30 PM but he snacks- The patient goes to bed at 12.30 AM and continues to sleep for 3 hours, but he uses 0.5 mg xanax,  wakes rarely bathroom breaks. The preferred sleep position is prone, with the support of 1 pillow.  Dreams are reportedly rare.  8 AM is the usual rise time. The patient wakes up with his wife, who wakes him at 7 AM.  He reports not feeling refreshed or restored in AM, with symptoms such as dry mouth, morning headaches when he had no CPAP available, and residual fatigue.  Naps are taken infrequently.    Review of Systems: Out of a complete 14 system review, the patient complains of only the following symptoms, and all other reviewed systems are negative.:  Fatigue, excessive sleepiness , snoring, unfragmented sleep on  Xanax, reports memory loss. Depression since age 27.    How likely are you to doze in the following situations: 0 = not likely, 1 = slight chance, 2 = moderate chance, 3 = high chance   Sitting and Reading? Watching Television? Sitting inactive in a public place (theater or meeting)? As a passenger in a car for an hour without a break? Lying down in the afternoon when circumstances permit? Sitting and talking to someone? Sitting quietly after lunch without alcohol? In a car, while stopped for a few minutes in traffic?   Total = 19/ 24 points   FSS endorsed at 59/ 63 points.   Social History   Socioeconomic History  . Marital status: Married    Spouse name: [name]  . Number of children: 2  . Years of education: 12  . Highest education level: Not on file  Occupational History    Comment: na  Tobacco Use  . Smoking status: Former Smoker    Packs/day: 0.50    Years: 10.00    Pack years: 5.00    Types: Cigarettes    Quit date: 10/27/2009    Years since quitting: 10.3  . Smokeless tobacco: Former User    Types: Snuff  Substance and Sexual Activity  . Alcohol use: Yes    Comment: occasional  . Drug use: No   . Sexual activity: Yes    Comment: Vasectomy  Other Topics Concern  . Not on file  Social History Narrative   Lives with wife   Caffeine -tea, 1 daily       Social Determinants of Health   Financial Resource Strain:   . Difficulty of Paying Living Expenses: Not on file  Food Insecurity:   . Worried About Running Out of Food in the Last Year: Not on file  . Ran Out of Food in the Last Year: Not on file  Transportation Needs:   . Lack of Transportation (Medical): Not on file  . Lack of Transportation (Non-Medical): Not on file  Physical Activity:   . Days of   Exercise per Week: Not on file  . Minutes of Exercise per Session: Not on file  Stress:   . Feeling of Stress : Not on file  Social Connections:   . Frequency of Communication with Friends and Family: Not on file  . Frequency of Social Gatherings with Friends and Family: Not on file  . Attends Religious Services: Not on file  . Active Member of Clubs or Organizations: Not on file  . Attends Archivist Meetings: Not on file  . Marital Status: Not on file    Family History  Problem Relation Age of Onset  . Hypertension Mother   . Migraines Mother   . Thyroid disease Mother   . Hypertension Father   . Hyperlipidemia Father   . Diabetes Father   . Cancer Sister        thyroid  . CAD Paternal Grandfather     Past Medical History:  Diagnosis Date  . Abnormal glucose   . Borderline hypertension   . BPH (benign prostatic hypertrophy)   . Crushing injury of left elbow and forearm 07/13/2015  . Depression, major, in partial remission ([facility]) 07/12/2015  . Dizziness   . Hyperlipidemia   . Hypertension   . Hypogonadism male   . 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 Morbid obesity ([facility])   . Obesity (BMI 30-39.9)   . OSA on CPAP   . Prediabetes   . Right ureteral stone   . Sleep apnea    C PAP  . Vertigo   . Vitamin D deficiency   . Wears glasses     Past Surgical History:   Procedure Laterality Date  . 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 ARTERY REPAIR Right 05/31/2014   Procedure: IRRIGATION, EXPLORATION, AND REPAIR OF RIGHT ARM WOUND;  Surgeon: Gwenyth Ober, MD;  Location: [facility];  Service: General;  Laterality: Right;  . CYSTOSCOPY WITH RETROGRADE PYELOGRAM, URETEROSCOPY AND STENT PLACEMENT Right 07/11/2016   Procedure: CYSTOSCOPY WITH RETROGRADE PYELOGRAM, URETEROSCOPY, BASKET STONE EXTRACTION  AND STENT PLACEMENT;  Surgeon: Alexis Frock, MD;  Location: [facility];  Service: Urology;  Laterality: Right;  45 MINS  C-ARM DIGITAL URETEROSCOPE HOLMIUM LASER  . ESOPHAGOGASTRODUODENOSCOPY       Current Outpatient Medications on File Prior to Visit  Medication Sig Dispense Refill  . ALPRAZolam (XANAX) 1 MG tablet Take 1/2  to 1 tablet    2 to 3   x /day & please try to limit to 5 days /week to avoid Addiction or Dementia 60 tablet 0  . atorvastatin (LIPITOR) 80 MG tablet TAKE 1 TABLET BY MOUTH EVERY DAY 90 tablet 1  . cephALEXin (KEFLEX) 500 MG capsule Take 1 capsule 3 x  /day with Meals for Skin  Infection 100 capsule 0  . Cholecalciferol (VITAMIN D3) 2000 units TABS Take 10,000 tablets by mouth daily.     . Cyanocobalamin (VITAMIN B 12 PO) Take 1 tablet by mouth daily.    Marland Kitchen gabapentin (NEURONTIN) 300 MG capsule START WITH 1 CAPSULE (300 MG) AT NIGHT X 1 WEEK THEN GO TO 2 CAPSULE (600 MG) AT NIGHT.    Marland Kitchen OVER THE COUNTER MEDICATION Takes an OTC allergy tablets daily.    . silodosin (RAPAFLO) 8 MG CAPS capsule Take 1 capsule at Bedtime for Prostate 90 capsule 3   No current facility-administered medications  on file prior to visit.    Allergies  Allergen Reactions  . Codeine Other (See Comments)    "can's sleep"  . Effexor [Venlafaxine] Other (See Comments)    ED  . Prednisone Other (See Comments)    "can't sleep"  . Tramadol Hcl Itching  . Zinc Other (See  Comments)    "stomache upset"    Physical exam:  Today's Vitals   02/13/20 0846  BP: 114/72  Pulse: 75  Temp: 97.7 F (36.5 C)  Weight: 270 lb (122.5 kg)  Height: 5' 10" (1.778 m)   Body mass index is 38.74 kg/m.   Wt Readings from Last 3 Encounters:  02/13/20 270 lb (122.5 kg)  02/06/20 273 lb (123.8 kg)  02/01/20 270 lb 3.2 oz (122.6 kg)     Ht Readings from Last 3 Encounters:  02/13/20 5' 10" (1.778 m)  02/06/20 5' 10" (1.778 m)  02/01/20 5' 11" (1.803 m)      General: The patient is awake, alert and appears not in acute distress. The patient is well developed.  Head: Normocephalic, atraumatic. Neck is supple. Mallampati 4  neck circumference:19.5  inches . Nasal airflow congested .  Retrognathia is mild, small , crowded dentition.   Dental status: removal of wisdom teeth  Cardiovascular:  Regular rate and cardiac rhythm by pulse,  without distended neck veins. Respiratory: Lungs are clear to auscultation.  Skin:  Without evidence of ankle edema, or rash. Mouth is dry,  Eyes are red, dry as well.  Trunk: The patient's posture is erect.   Neurologic exam : The patient is awake and alert, oriented to place and time.   Memory subjective described as intact.  Attention span & concentration ability appears normal.  Speech is fluent,  without  dysarthria, dysphonia or aphasia.  Mood and affect are appropriate.   Cranial nerves: no loss of smell or taste reported  Pupils are equal and briskly reactive to light. Funduscopic exam deferred.   Extraocular movements in vertical and horizontal planes were intact and without nystagmus. No Diplopia. Visual fields by finger perimetry are intact. Hearing was intact to soft voice and finger rubbing.    Facial sensation intact to fine touch.  Facial motor strength is symmetric and tongue and uvula move midline.  Neck ROM : rotation, tilt and flexion extension were normal for age and shoulder shrug was symmetrical.    Motor  exam:  Symmetric bulk, tone and ROM.   Normal tone without cog wheeling, symmetric grip strength . Sensory:  Fine touch, pinprick and vibration were tested  and  normal.  Proprioception tested in the upper extremities was normal.  Coordination: Rapid alternating movements in the fingers/hands were of normal speed.  The Finger-to-nose maneuver was intact without evidence of ataxia, dysmetria or tremor.  Gait and station: Patient could rise unassisted from a seated position, walked without assistive device.  Stance is of normal width/ base and the patient turned with 4 steps.  Toe and heel walk were deferred.  Deep tendon reflexes: in the  upper and lower extremities are symmetrically attenuated  .  Babinski response was deferred      Mr. Hallett presents with a history of borderline hypertension, obesity a BMI 38.7, abnormal glucose, depression in partial remission but basically present over 20 years on and off hyperlipidemia, OSA on CPAP, history of kidney stones.  He underwent detailed testing by dermatology, ENT doctors, and by his primary neurologist Dr. Penumalli.  There is slightly low vitamin   B12 level and is now taking a B supplement.  He had negative labs as to CBC, erythrocyte sedimentation rate, C-reactive protein, ANCA, complement, anti-Smith antibodies, HLA-B27 antigen, angiotensin-converting enzyme and C1 esterase inhibitor.  There is also a heavy metal panel ordered.  It has already come back and was negative important with the patient's history of occupational exposure to heavy metals.  From the CPAP download I can clearly see that he does have a high air leakage, and I would definitely want to refit him for a different interface-mask.  I also think he needs a new machine his current S9 Elite is usually programmed to give up after 7 years or so and the software will not be updated so he can use it for travel or keep it at a spare at home but it would be best if he would confirm his  current level of apnea #1 #2 if confirmed order an auto titration CPAP that offers a range of pressure #3 have him refitted for a better fitting interface.  A full facemask will really seal well for his facial hair.  So he may want to try shaving.   After spending a total time of  45 minutes face to face and additional time for physical and neurologic examination, review of laboratory studies,  personal review of imaging studies, reports and results of other testing and review of referral information / records as far as provided in visit, I have established the following assessments:  1) Excessive daytime sleepiness while compliant with CPAP at a setting of 15 cm water, original setting.  2) High degree of fatigue and  non restorative sleep- can be related to depression. 3) Neuropathy and gait instability, also memory loss.-    My Plan is to proceed with:  1) HST or attended sleep study to confirm apnea, refitting for a mask would be possible if he can come to the lab. He will need an autotitration device. His BMI is much higher than 12 years ago , and likely requires a higher CPAP pressure.    I would like to thank Unk Pinto, MD  for allowing me to meet with and to take care of this pleasant patient.   In short, Shane Saunders is presenting with EDS/ hypersomnia but inability to nap or fall asleep at night without medication., a symptom that can be attributed to anxiety and depression or untertreated OSA.  I plan to follow up either personally or through our NP within 2-3 month.   CC: I will share my notes with Dr Leta Baptist.   Electronically signed by: Larey Seat, MD 02/13/2020 9:09 AM  [facility] Board certified by The [employer] of Sleep Medicine and Diplomate of the [employer] of Sleep Medicine. Board certified In Neurology through the [employer], Fellow of the [employer] of Neurology. Medical Director of [employer].

## 2020-02-16 ENCOUNTER — Ambulatory Visit: Payer: BC Managed Care – PPO | Admitting: Internal Medicine

## 2020-02-21 ENCOUNTER — Telehealth: Payer: Self-pay | Admitting: *Deleted

## 2020-02-21 NOTE — Telephone Encounter (Signed)
Called patient in regard to Methylphenidate prior authorization request. Patient states he did not buy RX and does not plan to take the medication. [facility] made aware.

## 2020-02-22 ENCOUNTER — Encounter: Payer: Self-pay | Admitting: Internal Medicine

## 2020-02-22 ENCOUNTER — Ambulatory Visit (INDEPENDENT_AMBULATORY_CARE_PROVIDER_SITE_OTHER): Payer: BC Managed Care – PPO | Admitting: Internal Medicine

## 2020-02-22 ENCOUNTER — Other Ambulatory Visit: Payer: Self-pay

## 2020-02-22 VITALS — BP 134/84 | HR 72 | Temp 97.2°F | Resp 16 | Ht 71.0 in | Wt 276.8 lb

## 2020-02-22 DIAGNOSIS — F419 Anxiety disorder, unspecified: Secondary | ICD-10-CM | POA: Diagnosis not present

## 2020-02-22 DIAGNOSIS — Z79899 Other long term (current) drug therapy: Secondary | ICD-10-CM

## 2020-02-22 DIAGNOSIS — I10 Essential (primary) hypertension: Secondary | ICD-10-CM | POA: Diagnosis not present

## 2020-02-22 DIAGNOSIS — R42 Dizziness and giddiness: Secondary | ICD-10-CM

## 2020-02-22 MED ORDER — ESCITALOPRAM OXALATE 20 MG PO TABS: ORAL_TABLET | ORAL | 3 refills | Status: DC

## 2020-02-22 NOTE — Progress Notes (Signed)
   History of Present Illness:    This nice 49 yo WM returns for F/U . Patient has has recent extensive evaluations by ENT & Neurologists in [place] and at [facility] [place] in [place] for c/o "dizziness" with no definite pathology discovered despite extensive evaluations. He recently underwent repeat OSA evaluation by Dr Vickey Huger. He had been tapered off of his Cymbalta and Wellbutrin to r/o those meds as a contributory factor.   Medications  .  atorvastatin (LIPITOR) 80 MG tablet, TAKE 1 TABLET  EVERY DAY .  ALPRAZolam (XANAX) 1 MG tablet, Take 1/2  to 1 tablet    2 to 3   x /day & please try to limit to 5 days /week to avoid Addiction or Dementia .  cephALEXin (KEFLEX) 500 MG capsule, Take 1 capsule 3 x  /day with Meals for Skin  Infection .  Cholecalciferol (VITAMIN D3) 2000 units TABS, Take 10,000 tablets by mouth daily.  Marland Kitchen  gabapentin (NEURONTIN) 300 MG capsule, START WITH 1 CAPSULE (300 MG) AT NIGHT X 1 WEEK THEN GO TO 2 CAPSULE (600 MG) AT NIGHT. Marland Kitchen  OVER THE COUNTER MEDICATION, Takes an OTC allergy tablets daily. .  silodosin (RAPAFLO) 8 MG CAPS capsule, Take 1 capsule at Bedtime for Prostate  Problem list  He has Essential hypertension; Hyperlipidemia, mixed; OSA on CPAP; Abnormal glucose; Morbid obesity (HCC); Vitamin D deficiency; Medication management; Depression, major, in partial remission (HCC); Crushing injury of left elbow and forearm; FH: hypertension; Prediabetes; Former smoker; Family history of ischemic heart disease; BPH with obstruction/lower urinary tract symptoms; Excessive daytime sleepiness; and Chronic fatigue on their problem list.   Observations/Objective:  BP 134/84   Pulse 72   Temp (!) 97.2 F (36.2 C)   Resp 16   Ht 5\' 11"  (1.803 m)   Wt 276 lb 12.8 oz (125.6 kg)   BMI 38.61 kg/m   HEENT - WNL. Neck - supple.  Chest - Clear equal BS. Cor - Nl HS. RRR w/o sig MGR. PP 1(+). No edema. MS- FROM w/o deformities.  Gait Nl. Neuro -  Nl w/o focal  abnormalities.  Assessment and Plan:  1. Essential hypertension  - CBC with Differential/Platelet - COMPLETE METABOLIC PANEL WITH GFR  2. Dizziness  - CBC with Differential/Platelet - COMPLETE METABOLIC PANEL WITH GFR  3. Medication management  - CBC with Differential/Platelet - COMPLETE METABOLIC PANEL WITH GFR  4. Anxiety tension state  - escitalopram (LEXAPRO) 20 MG tablet; Take 1 tablet Daily for Mood  Dispense: 90 tablet; Refill: 3        I discussed the assessment and treatment plan with the patient. The patient was provided an opportunity to ask questions and all were answered. The patient agreed with the plan and demonstrated an understanding of the instructions. Between 15-20 minutes of counseling, chart review, and critical decision making was performed   , MD

## 2020-02-23 ENCOUNTER — Other Ambulatory Visit: Payer: Self-pay | Admitting: Internal Medicine

## 2020-02-23 LAB — CBC WITH DIFFERENTIAL/PLATELET
Absolute Monocytes: 731 cells/uL (ref 200–950)
Basophils Absolute: 92 cells/uL (ref 0–200)
Basophils Relative: 1.3 %
Eosinophils Absolute: 298 cells/uL (ref 15–500)
Eosinophils Relative: 4.2 %
HCT: 45.7 % (ref 38.5–50.0)
Hemoglobin: 15.8 g/dL (ref 13.2–17.1)
Lymphs Abs: 1789 cells/uL (ref 850–3900)
MCH: 32.6 pg (ref 27.0–33.0)
MCHC: 34.6 g/dL (ref 32.0–36.0)
MCV: 94.2 fL (ref 80.0–100.0)
MPV: 10.9 fL (ref 7.5–12.5)
Monocytes Relative: 10.3 %
Neutro Abs: 4189 cells/uL (ref 1500–7800)
Neutrophils Relative %: 59 %
RBC: 4.85 10*6/uL (ref 4.20–5.80)
RDW: 12.5 % (ref 11.0–15.0)
Total Lymphocyte: 25.2 %
WBC: 7.1 10*3/uL (ref 3.8–10.8)

## 2020-02-23 LAB — COMPLETE METABOLIC PANEL WITH GFR
AG Ratio: 2 (calc) (ref 1.0–2.5)
ALT: 50 U/L — ABNORMAL HIGH (ref 9–46)
AST: 29 U/L (ref 10–35)
Albumin: 4.5 g/dL (ref 3.6–5.1)
Alkaline phosphatase (APISO): 65 U/L (ref 35–144)
BUN: 12 mg/dL (ref 7–25)
CO2: 30 mmol/L (ref 20–32)
Calcium: 9.9 mg/dL (ref 8.6–10.3)
Chloride: 105 mmol/L (ref 98–110)
Creat: 1.08 mg/dL (ref 0.70–1.33)
GFR, Est African American: 92 mL/min/{1.73_m2} (ref >=60)
GFR, Est Non African American: 80 mL/min/{1.73_m2} (ref >=60)
Globulin: 2.2 g/dL (calc) (ref 1.9–3.7)
Glucose, Bld: 97 mg/dL (ref 65–99)
Potassium: 4.2 mmol/L (ref 3.5–5.3)
Sodium: 142 mmol/L (ref 135–146)
Total Bilirubin: 0.7 mg/dL (ref 0.2–1.2)
Total Protein: 6.7 g/dL (ref 6.1–8.1)

## 2020-02-24 ENCOUNTER — Telehealth: Payer: Self-pay | Admitting: *Deleted

## 2020-02-24 NOTE — Telephone Encounter (Signed)
Spouse called and reported the patient has a swollen are in his right cheek and asked if it should be biopsied. Per Dr Oneta Rack, the patient should call his ENT in regard to the possible biopsy. Spouse is aware.

## 2020-03-01 ENCOUNTER — Encounter: Payer: Self-pay | Admitting: Adult Health Nurse Practitioner

## 2020-03-01 ENCOUNTER — Ambulatory Visit (INDEPENDENT_AMBULATORY_CARE_PROVIDER_SITE_OTHER): Payer: BC Managed Care – PPO | Admitting: Adult Health Nurse Practitioner

## 2020-03-01 ENCOUNTER — Other Ambulatory Visit: Payer: Self-pay

## 2020-03-01 VITALS — BP 126/80 | HR 84 | Temp 97.0°F | Resp 18 | Ht 71.0 in | Wt 273.6 lb

## 2020-03-01 DIAGNOSIS — J3089 Other allergic rhinitis: Secondary | ICD-10-CM

## 2020-03-01 DIAGNOSIS — J324 Chronic pansinusitis: Secondary | ICD-10-CM

## 2020-03-01 MED ORDER — DEXAMETHASONE 0.5 MG PO TABS: ORAL_TABLET | ORAL | 0 refills | Status: DC

## 2020-03-01 MED ORDER — DEXAMETHASONE SODIUM PHOSPHATE 10 MG/ML IJ SOLN
10.0000 mg | Freq: Once | INTRAMUSCULAR | Status: AC
  Administered 2020-03-01: 10 mg via INTRAMUSCULAR

## 2020-03-01 NOTE — Progress Notes (Signed)
Assessment and Plan:  Prateek was seen today for sinusitis.  Diagnoses and all orders for this visit:  Chronic pansinusitis -     dexamethasone (DECADRON) injection 10 mg -     dexamethasone (DECADRON) 0.5 MG tablet; Take 1 tablet PO BID for 5 days and then 1 tablet PO QDaily for 5 days. Discussed Neti pot BID, bottles or distilled water only.  Non-seasonal allergic rhinitis due to other allergic trigger Discussed routine management of allergies and importance of daily regiment Discussed antihistamines    Further disposition pending results of labs. Discussed med's effects and SE's.   Over 30 minutes of face to face interview,  exam, counseling, chart review, and critical decision making was performed.   Future Appointments  Date Time Provider Department Center  03/23/2020  8:40 AM Nahser, Deloris Ping, MD [facility]  05/07/2020  2:00 PM [name], MD [facility] None    ------------------------------------------------------------------------------------------------------------------   HPI 49 y.o.male presents for elevation of sinuses congestion.  He is having some coughing and shortness, he was given and albuterol inhaler which he has not used.  He had a biopsy of lips related to edema.  Allergist ruled out as allergy.  He is follow with ENT for this and been battling for over two years.  He reports his symptoms started about three weeks ago.  He has been treating with antihistamines, zyrtec D and nasal spray daily (unsure which one).  He battles allergies all year round of varying triggers.  He reports he does not take antihistamines daiuly and unable to describe any particular routine that he uses.  He is taking ibuprofen 400mg  in am and 400mg  in pm for his back.  This has not been helping with his sinuses.  He reports facial pain around his cheeck and forehead that is constant.  This increases with positional change, ie putting on shoes.  He also uses a CPAP for  OSA.  He is having a sleep study evaluation in 4 days.    He is following with Dermatology for folliculitis and taking cephalexin 500mg  three times a day.   Past Medical History:  Diagnosis Date  . Abnormal glucose   . Borderline hypertension   . BPH (benign prostatic hypertrophy)   . Crushing injury of left elbow and forearm 07/13/2015  . 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 Dizziness   . Hyperlipidemia   . Hypertension   . Hypogonadism male   . 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 Morbid obesity (HCC)   . Obesity (BMI 30-39.9)   . OSA on CPAP   . Prediabetes   . Right ureteral stone   . Sleep apnea    C PAP  . Vertigo   . Vitamin D deficiency   . Wears glasses      Allergies  Allergen Reactions  . Codeine Other (See Comments)    "can's sleep"  . Effexor [Venlafaxine] Other (See Comments)    ED  . Prednisone Other (See Comments)    "can't sleep"  . Tramadol Hcl Itching  . Zinc Other (See Comments)    "stomache upset"    Current Outpatient Medications on File Prior to Visit  Medication Sig  . ALPRAZolam (XANAX) 1 MG tablet Take 1/2 - 1 tablet 2 - 3 x /day ONLY if needed for Anxiety Attack &  limit to 5 days /week to avoid Addiction & Dementia  . atorvastatin (LIPITOR) 80 MG tablet  TAKE 1 TABLET BY MOUTH EVERY DAY  . cephALEXin (KEFLEX) 500 MG capsule Take 1 capsule 3 x  /day with Meals for Skin  Infection  . Cholecalciferol (VITAMIN D3) 2000 units TABS Take 10,000 tablets by mouth daily.   . Cyanocobalamin (VITAMIN B 12 PO) Take 1 tablet by mouth daily.  Marland Kitchen OVER THE COUNTER MEDICATION Takes an OTC allergy tablets daily.  . silodosin (RAPAFLO) 8 MG CAPS capsule Take 1 capsule at Bedtime for Prostate   No current facility-administered medications on file prior to visit.    ROS: all negative except above.   Physical Exam:  BP 126/80   Pulse 84   Temp (!) 97 F (36.1 C)   Resp 18   Ht 5\' 11"  (1.803 m)   Wt 273  lb 9.6 oz (124.1 kg)   SpO2 95%   BMI 38.16 kg/m   General Appearance: Well nourished, in no apparent distress. Eyes: PERRLA, EOMs, conjunctiva no swelling or erythema Sinuses: Frontal/maxillary tenderness noted.  Erythema noted bilateral nasal cavities. ENT/Mouth: Ext aud canals clear, TMs without erythema, bulging. No erythema, swelling, or exudate on post pharynx.  Tonsils not swollen or erythematous. Hearing normal.  Neck: Supple, thyroid normal.  Respiratory: Respiratory effort normal, BS equal bilaterally without rales, rhonchi, wheezing or stridor.  Cardio: RRR with no MRGs. Brisk peripheral pulses without edema.  Abdomen: Soft, + BS.  Non tender, no guarding, rebound, hernias, masses. Lymphatics: Non tender without lymphadenopathy.  Musculoskeletal: Full ROM, 5/5 strength, normal gait.  Skin: Warm, dry without rashes, lesions, ecchymosis.  Neuro: Cranial nerves intact. Normal muscle tone, no cerebellar symptoms. Sensation intact.  Psych: Awake and oriented X 3, normal affect, Insight and Judgment appropriate.     Garnet Sierras, NP 11:53 AM [facility]

## 2020-03-01 NOTE — Patient Instructions (Addendum)
  Use this twice a day, in morning and at night while having nasal congestion symptoms.   [facility] Sinus Rinse / Neti Pot Use warm bottled or distilled water DO NOT use tap water! Use twice a day as needed This will help to sooth irritated sinuses and clear nasal congestion If using nasal sprays, do so after completing this.   Flonase (Fluticasone) One spray in each nostril daily  This will help to open your nasal passages Use this AFTER you do any type of nasal rinse  When your nose is stopped up take Zyrtec D.  This has a decongestant in it.  When your nose is runny take plain Zyrtec.     Saline Nasal Spray: You can get this at any pharmacy Use as directed on package This will help to sooth inside of your nose from irritation  For Inflammation of Sinuses  We will send in dexamethasone for your to take.  Take one tablet twice a day for five days.  Then one tablets daily for 5 days.    Ibuprofen / Advil / Motrin - Will also help with inflammation  600mg  every six hours OR 800mg  every 8 hours If you have this at home each tablet is 200mg .  For Sinus Pain:  Tylenol Extra Strength 500mg  Take 1-2 tablets every 8 hours as needed for fever or pain Do not take more than 4,000mg  to tylenol in 24 hours  May rotate between Tylenol and ibuprofen.  They are different.    IF you do not improve with the dexamethasone in 4 days THEN pick up antibiotics.  You are already on antibiotics so this can increase diarrhea!

## 2020-03-05 ENCOUNTER — Ambulatory Visit (INDEPENDENT_AMBULATORY_CARE_PROVIDER_SITE_OTHER): Payer: BC Managed Care – PPO | Admitting: Neurology

## 2020-03-05 DIAGNOSIS — G4733 Obstructive sleep apnea (adult) (pediatric): Secondary | ICD-10-CM | POA: Diagnosis not present

## 2020-03-05 DIAGNOSIS — R7309 Other abnormal glucose: Secondary | ICD-10-CM

## 2020-03-05 DIAGNOSIS — I10 Essential (primary) hypertension: Secondary | ICD-10-CM

## 2020-03-05 DIAGNOSIS — Z9989 Dependence on other enabling machines and devices: Secondary | ICD-10-CM

## 2020-03-05 DIAGNOSIS — F3341 Major depressive disorder, recurrent, in partial remission: Secondary | ICD-10-CM

## 2020-03-05 DIAGNOSIS — R5382 Chronic fatigue, unspecified: Secondary | ICD-10-CM

## 2020-03-05 DIAGNOSIS — G4719 Other hypersomnia: Secondary | ICD-10-CM

## 2020-03-07 ENCOUNTER — Other Ambulatory Visit: Payer: Self-pay | Admitting: Internal Medicine

## 2020-03-07 DIAGNOSIS — F320 Major depressive disorder, single episode, mild: Secondary | ICD-10-CM

## 2020-03-09 ENCOUNTER — Ambulatory Visit (INDEPENDENT_AMBULATORY_CARE_PROVIDER_SITE_OTHER): Payer: BC Managed Care – PPO | Admitting: Internal Medicine

## 2020-03-09 ENCOUNTER — Other Ambulatory Visit: Payer: Self-pay

## 2020-03-09 ENCOUNTER — Ambulatory Visit: Payer: BC Managed Care – PPO | Admitting: Internal Medicine

## 2020-03-09 ENCOUNTER — Encounter: Payer: Self-pay | Admitting: Internal Medicine

## 2020-03-09 VITALS — BP 136/74 | HR 84 | Temp 97.5°F | Wt 260.0 lb

## 2020-03-09 DIAGNOSIS — R5383 Other fatigue: Secondary | ICD-10-CM

## 2020-03-09 DIAGNOSIS — E559 Vitamin D deficiency, unspecified: Secondary | ICD-10-CM | POA: Diagnosis not present

## 2020-03-09 DIAGNOSIS — F09 Unspecified mental disorder due to known physiological condition: Secondary | ICD-10-CM | POA: Diagnosis not present

## 2020-03-09 DIAGNOSIS — H532 Diplopia: Secondary | ICD-10-CM | POA: Diagnosis not present

## 2020-03-09 DIAGNOSIS — R531 Weakness: Secondary | ICD-10-CM | POA: Diagnosis not present

## 2020-03-09 DIAGNOSIS — R279 Unspecified lack of coordination: Secondary | ICD-10-CM

## 2020-03-09 DIAGNOSIS — Z79899 Other long term (current) drug therapy: Secondary | ICD-10-CM

## 2020-03-09 NOTE — Patient Instructions (Signed)
Weakness  Weakness is a lack of strength. You may feel weak all over your body (generalized), or you may feel weak in one specific part of your body (focal). Common causes of weakness include:   Infection and immune system disorders.  Physical exhaustion.  Internal bleeding or other blood loss that results in a lack of red blood cells (anemia).  Dehydration.  An imbalance in mineral (electrolyte) levels, such as potassium.  Heart disease, circulation problems, or stroke.   Other causes include:  Some medicines or cancer treatment.  Stress, anxiety, or depression.  Nervous system disorders.  Thyroid disorders.  Loss of muscle strength because of age or inactivity.  Poor sleep quality or sleep disorders.   The cause of your weakness may not be known. Some causes of weakness can be serious, so it is important to see your health care provider.  Follow these instructions at home:  Activity  Rest as needed.  Try to get enough sleep. Most adults need 7-8 hours of quality sleep each night. Talk to your health care provider about how much sleep you need each night.  Do exercises, such as arm curls and leg raises, for 30 minutes at least 2 days a week or as told by your health care provider. This helps build muscle strength.  Consider working with a physical therapist or trainer who can develop an exercise plan to help you gain muscle strength.   General instructions   Take over-the-counter and prescription medicines only as told by your health care provider.  Eat a healthy, well-balanced diet. This includes: ? Proteins to build muscles, such as lean meats and fish. ? Fresh fruits and vegetables. ? Carbohydrates to boost energy, such as whole grains.  Drink enough fluid to keep your urine pale yellow.  Keep all follow-up visits as told by your health care provider. This is important.   Contact a health care provider if your weakness:   Does not improve or gets  worse.  Affects your ability to think clearly.  Affects your ability to do your normal daily activities.   Get help right away if you:   Develop sudden weakness, especially on one side of your face or body.  Have chest pain.  Have trouble breathing or shortness of breath.  Have problems with your vision.  Have trouble talking or swallowing.  Have trouble standing or walking.  Are light-headed or lose consciousness.    Summary    Weakness is a lack of strength. You may feel weak all over your body or just in one specific part of your body.  Weakness can be caused by a variety of things. In some cases, the cause may be unknown.  Rest as needed, and try to get enough sleep. Most adults need 7-8 hours of quality sleep each night.  Eat a healthy, well-balanced diet.

## 2020-03-09 NOTE — Progress Notes (Addendum)
History of Present Illness:     This nice 49 yo MWM has hx/o intermittent swelling of his Rt upper lip that he's documented with pictures on his iPhone. Patient is frustrated with his lack of a diagnosis & treatment.       He's been seen by numerous specialists for evaluation  including an ENT Dr in [place] - referred to ENT at [facility] then to a Dermatologist & Neurologist Kathline Magic specialist at [facility] and subsequently seen by a 2sd [place] ENT Dr recently to Bx his lip to r/o Granulomatosis Cheilitis ([place]). Recently had Sleep study by Dr Brett Fairy to obtain new CPAP device.      Patient in add'n, also had recent Neuro evaluation by Dr Leta Baptist to evaluate symptoms of imbalance and Gait difficulty.         Patient  has had numerous Normal studies including neck CT &and a Brain MRI, ENG's, EEG's. Had a slightly low B12 = 425 and is only taking oral B supplement.  He's also  negative labs for  CBC, ESR, hsCRP, ANCA, total Compliment and C3 & C4, Anti-Smith, HLA-B 27 Ag, Angiotensin converting enzyme and C1 Esterase Inhibitor.     Today, he presents again with complaints of scattered patchy paresthesias, sensations of imbalance and poor coordination. He also reports diplopia, but has difficulty defining horizontal, lateral or vertical distortions  Medications  .  atorvastatin (LIPITOR) 80 MG tablet, TAKE 1 TABLET BY MOUTH EVERY DAY .  Cyanocobalamin (VITAMIN B 12 PO), Take 1 tablet by mouth daily. Marland Kitchen  ALPRAZolam 1 MG tablet, Take 1/2 - 1 tablet 2 - 3 x /day ONLY if needed for Anxiety Attack .  Bupropion  300 MG , Take 1 tablet Daily for Mood , Focus & Concentration - off  .  cephALEXin (KEFLEX) 500 MG capsule, Take 1 capsule 3 x  /day with Meals for Skin  Infection .  Cholecalciferol (VITAMIN D3) 2000 units TABS, Take 10,000 tablets by mouth daily.  Marland Kitchen  escitalopram (LEXAPRO) 20 MG tablet, Take 1 tablet Daily for Mood - off .  silodosin (RAPAFLO) 8 MG CAPS capsule, Take 1 capsule  at Bedtime for Prostate  Problem list He has Essential hypertension; Hyperlipidemia, mixed; OSA on CPAP; Abnormal glucose; Morbid obesity ([place]); Vitamin D deficiency; Medication management; Depression, major, in partial remission ([place]); Crushing injury of left elbow and forearm; FH: hypertension; Prediabetes; Former smoker; Family history of ischemic heart disease; BPH with obstruction/lower urinary tract symptoms; Excessive daytime sleepiness; and Chronic fatigue on their problem list.   Observations/Objective:  BP 136/74   Pulse 84   Temp (!) 97.5 F (36.4 C)   Wt 260 lb (117.9 kg)   SpO2 95%   BMI 36.26 kg/m   HEENT - WNL. N/O/P - Clear - No angioedema. No obvious heterotropia or heterophoria.  Neck - supple.  Chest - Clear equal BS. Cor - Nl HS. RRR w/o sig MGR. PP 1(+). No edema. MS- FROM w/o deformities.  Very unstable tandem walk. Neuro -  Nl w/o focal abnormalities. (+) Romberg accentuated with shutting eyes.  F ->N - fair to poor.  Assessment and Plan:  1. Incoordination  - Ambulatory referral to Neurology-  back to Dr Leta Baptist for consideration of whether other Neuro scanning of Brain or spinal cord or possibly LP might be of value to to r/o Multiple Sclerosis of some type of encephalitis  - infectious or autoimmune.  - In hindsight , he has not been tested for  Lyme's Disease or syphilis.  2. Cognitive dysfunction  - SAR CoV2 Serology (COVID 19)AB(IGG)IA - repeat Covid test to r/o late phase effect of possible earlier occult infection  3. Diplopia  4. Weakness  - CBC with Differential/Platelet - COMPLETE METABOLIC PANEL WITH GFR  5. Fatigue, unspecified   - CBC with Differential/Platelet - COMPLETE METABOLIC PANEL WITH GFR   6. Medication management  - CBC with  Differential/Platelet - COMPLETE METABOLIC PANEL WITH GFR - SAR CoV2 Serology (COVID 19)AB(IGG)IA        I discussed the assessment and treatment plan with the patient. The patient was  provided an opportunity to ask questions and all were answered. The patient agreed with the plan and demonstrated an understanding of the instructions. Between 30-40 minutes of Exam, counseling, chart review, and critical decision making was performed    Kirtland Bouchard, MD

## 2020-03-10 LAB — COMPLETE METABOLIC PANEL WITH GFR
AG Ratio: 2.1 (calc) (ref 1.0–2.5)
ALT: 46 U/L (ref 9–46)
AST: 24 U/L (ref 10–35)
Albumin: 4.8 g/dL (ref 3.6–5.1)
Alkaline phosphatase (APISO): 79 U/L (ref 35–144)
BUN: 18 mg/dL (ref 7–25)
CO2: 28 mmol/L (ref 20–32)
Calcium: 10.2 mg/dL (ref 8.6–10.3)
Chloride: 103 mmol/L (ref 98–110)
Creat: 1.27 mg/dL (ref 0.70–1.33)
GFR, Est African American: 76 mL/min/{1.73_m2} (ref >=60)
GFR, Est Non African American: 65 mL/min/{1.73_m2} (ref >=60)
Globulin: 2.3 g/dL (calc) (ref 1.9–3.7)
Glucose, Bld: 100 mg/dL — ABNORMAL HIGH (ref 65–99)
Potassium: 4.4 mmol/L (ref 3.5–5.3)
Sodium: 140 mmol/L (ref 135–146)
Total Bilirubin: 1.2 mg/dL (ref 0.2–1.2)
Total Protein: 7.1 g/dL (ref 6.1–8.1)

## 2020-03-10 LAB — CBC WITH DIFFERENTIAL/PLATELET
Absolute Monocytes: 954 cells/uL — ABNORMAL HIGH (ref 200–950)
Basophils Absolute: 81 cells/uL (ref 0–200)
Basophils Relative: 0.9 %
Eosinophils Absolute: 171 cells/uL (ref 15–500)
Eosinophils Relative: 1.9 %
HCT: 48.5 % (ref 38.5–50.0)
Hemoglobin: 16.8 g/dL (ref 13.2–17.1)
Lymphs Abs: 1413 cells/uL (ref 850–3900)
MCH: 32.4 pg (ref 27.0–33.0)
MCHC: 34.6 g/dL (ref 32.0–36.0)
MCV: 93.6 fL (ref 80.0–100.0)
MPV: 11.2 fL (ref 7.5–12.5)
Monocytes Relative: 10.6 %
Neutro Abs: 6381 cells/uL (ref 1500–7800)
Neutrophils Relative %: 70.9 %
Platelets: 56 10*3/uL — ABNORMAL LOW (ref 140–400)
RBC: 5.18 10*6/uL (ref 4.20–5.80)
RDW: 12.2 % (ref 11.0–15.0)
Total Lymphocyte: 15.7 %
WBC: 9 10*3/uL (ref 3.8–10.8)

## 2020-03-11 ENCOUNTER — Encounter: Payer: Self-pay | Admitting: Internal Medicine

## 2020-03-12 ENCOUNTER — Encounter (HOSPITAL_COMMUNITY): Payer: Self-pay

## 2020-03-12 ENCOUNTER — Other Ambulatory Visit: Payer: Self-pay | Admitting: Internal Medicine

## 2020-03-12 ENCOUNTER — Emergency Department (HOSPITAL_COMMUNITY): Payer: BC Managed Care – PPO

## 2020-03-12 ENCOUNTER — Emergency Department (HOSPITAL_COMMUNITY)
Admission: EM | Admit: 2020-03-12 | Discharge: 2020-03-12 | Disposition: A | Discharge status: Home / Self Care | Payer: BC Managed Care – PPO | Attending: Emergency Medicine | Admitting: Emergency Medicine

## 2020-03-12 ENCOUNTER — Other Ambulatory Visit: Payer: Self-pay

## 2020-03-12 DIAGNOSIS — Z79899 Other long term (current) drug therapy: Secondary | ICD-10-CM | POA: Insufficient documentation

## 2020-03-12 DIAGNOSIS — F09 Unspecified mental disorder due to known physiological condition: Secondary | ICD-10-CM

## 2020-03-12 DIAGNOSIS — Z87891 Personal history of nicotine dependence: Secondary | ICD-10-CM | POA: Diagnosis not present

## 2020-03-12 DIAGNOSIS — I10 Essential (primary) hypertension: Secondary | ICD-10-CM | POA: Diagnosis not present

## 2020-03-12 DIAGNOSIS — R279 Unspecified lack of coordination: Secondary | ICD-10-CM

## 2020-03-12 DIAGNOSIS — R0789 Other chest pain: Secondary | ICD-10-CM | POA: Diagnosis not present

## 2020-03-12 DIAGNOSIS — R079 Chest pain, unspecified: Secondary | ICD-10-CM | POA: Diagnosis present

## 2020-03-12 DIAGNOSIS — N289 Disorder of kidney and ureter, unspecified: Secondary | ICD-10-CM

## 2020-03-12 DIAGNOSIS — H532 Diplopia: Secondary | ICD-10-CM

## 2020-03-12 DIAGNOSIS — R2681 Unsteadiness on feet: Secondary | ICD-10-CM

## 2020-03-12 LAB — BASIC METABOLIC PANEL
Anion gap: 8 (ref 5–15)
BUN: 14 mg/dL (ref 6–20)
CO2: 24 mmol/L (ref 22–32)
Calcium: 9.4 mg/dL (ref 8.9–10.3)
Chloride: 108 mmol/L (ref 98–111)
Creatinine, Ser: 1.05 mg/dL (ref 0.61–1.24)
GFR calc Af Amer: >60 mL/min (ref >60)
GFR calc non Af Amer: >60 mL/min (ref >60)
Glucose, Bld: 101 mg/dL — ABNORMAL HIGH (ref 70–99)
Potassium: 4 mmol/L (ref 3.5–5.1)
Sodium: 140 mmol/L (ref 135–145)

## 2020-03-12 LAB — D-DIMER, QUANTITATIVE: D-Dimer, Quant: <0.27 ug/mL-FEU (ref 0.00–0.50)

## 2020-03-12 LAB — URINALYSIS, ROUTINE W REFLEX MICROSCOPIC
Bilirubin Urine: NEGATIVE
Glucose, UA: NEGATIVE mg/dL
Hgb urine dipstick: NEGATIVE
Ketones, ur: NEGATIVE mg/dL
Leukocytes,Ua: NEGATIVE
Nitrite: NEGATIVE
Protein, ur: NEGATIVE mg/dL
Specific Gravity, Urine: 1.018 (ref 1.005–1.030)
pH: 5 (ref 5.0–8.0)

## 2020-03-12 LAB — CBC
HCT: 45.4 % (ref 39.0–52.0)
Hemoglobin: 16.2 g/dL (ref 13.0–17.0)
MCH: 32.7 pg (ref 26.0–34.0)
MCHC: 35.7 g/dL (ref 30.0–36.0)
MCV: 91.5 fL (ref 80.0–100.0)
Platelets: 80 10*3/uL — ABNORMAL LOW (ref 150–400)
RBC: 4.96 MIL/uL (ref 4.22–5.81)
RDW: 11.7 % (ref 11.5–15.5)
WBC: 8 10*3/uL (ref 4.0–10.5)
nRBC: 0 % (ref 0.0–0.2)

## 2020-03-12 LAB — TROPONIN I (HIGH SENSITIVITY)
Troponin I (High Sensitivity): <2 ng/L (ref <18)
Troponin I (High Sensitivity): <2 ng/L (ref <18)

## 2020-03-12 LAB — SAR COV2 SEROLOGY (COVID19)AB(IGG),IA: SARS CoV2 AB IGG: NEGATIVE

## 2020-03-12 MED ORDER — FAMOTIDINE 20 MG PO TABS
20.0000 mg | ORAL_TABLET | Freq: Once | ORAL | Status: AC
  Administered 2020-03-12: 20 mg via ORAL
  Filled 2020-03-12: qty 1

## 2020-03-12 MED ORDER — SODIUM CHLORIDE (PF) 0.9 % IJ SOLN
INTRAMUSCULAR | Status: AC
  Filled 2020-03-12: qty 50

## 2020-03-12 MED ORDER — ALUM & MAG HYDROXIDE-SIMETH 200-200-20 MG/5ML PO SUSP
30.0000 mL | Freq: Once | ORAL | Status: AC
  Administered 2020-03-12: 30 mL via ORAL
  Filled 2020-03-12: qty 30

## 2020-03-12 MED ORDER — IOHEXOL 350 MG/ML SOLN
100.0000 mL | Freq: Once | INTRAVENOUS | Status: AC | PRN
  Administered 2020-03-12: 100 mL via INTRAVENOUS

## 2020-03-12 MED ORDER — SODIUM CHLORIDE 0.9% FLUSH: 3.0000 mL | Freq: Once | INTRAVENOUS | Status: DC

## 2020-03-12 MED ORDER — ACETAMINOPHEN 500 MG PO TABS
1000.0000 mg | ORAL_TABLET | Freq: Once | ORAL | Status: AC
  Administered 2020-03-12: 1000 mg via ORAL
  Filled 2020-03-12: qty 2

## 2020-03-12 NOTE — ED Notes (Signed)
Pt in radiology for CT scan.

## 2020-03-12 NOTE — ED Provider Notes (Signed)
[facility] DEPT Provider Note   CSN: [id number] Arrival date & time: 03/12/20  1411     History Chief Complaint  Patient presents with  . Chest Pain    Shane Saunders is a 49 y.o. male.  The history is provided by the patient. No language interpreter was used.  Chest Pain Pain location:  L chest Pain quality: aching   Pain radiates to:  Does not radiate Pain severity:  Moderate Timing:  Constant Progression:  Worsening Chronicity:  New Relieved by:  Nothing Worsened by:  Nothing Ineffective treatments:  None tried Associated symptoms: fatigue and shortness of breath   Risk factors: high cholesterol and hypertension        Past Medical History:  Diagnosis Date  . Abnormal glucose   . Borderline hypertension   . BPH (benign prostatic hypertrophy)   . Crushing injury of left elbow and forearm 07/13/2015  . Depression, major, in partial remission ([place]) 07/12/2015  . Dizziness   . Hyperlipidemia   . Hypertension   . Hypogonadism male   . 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 Morbid obesity ([place])   . Obesity (BMI 30-39.9)   . OSA on CPAP   . Prediabetes   . Right ureteral stone   . Sleep apnea    C PAP  . Vertigo   . Vitamin D deficiency   . Wears glasses     Patient Active Problem List   Diagnosis Date Noted  . Excessive daytime sleepiness 02/13/2020  . Chronic fatigue 02/13/2020  . BPH with obstruction/lower urinary tract symptoms   . Prediabetes 10/11/2018  . Former smoker 10/11/2018  . Family history of ischemic heart disease 10/11/2018  . FH: hypertension 03/23/2018  . Crushing injury of left elbow and forearm 07/13/2015  . Depression, major, in partial remission ([place]) 07/12/2015  . Medication management 02/08/2014  . Essential hypertension   . Hyperlipidemia, mixed   . OSA on CPAP   . Abnormal glucose   . Morbid obesity ([place])   . Vitamin D deficiency     Past Surgical History:    Procedure Laterality Date  . 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 ARTERY REPAIR Right 05/31/2014   Procedure: IRRIGATION, EXPLORATION, AND REPAIR OF RIGHT ARM WOUND;  Surgeon: Gwenyth Ober, MD;  Location: [facility];  Service: General;  Laterality: Right;  . CYSTOSCOPY WITH RETROGRADE PYELOGRAM, URETEROSCOPY AND STENT PLACEMENT Right 07/11/2016   Procedure: CYSTOSCOPY WITH RETROGRADE PYELOGRAM, URETEROSCOPY, BASKET STONE EXTRACTION  AND STENT PLACEMENT;  Surgeon: Alexis Frock, MD;  Location: [facility];  Service: Urology;  Laterality: Right;  45 MINS  C-ARM DIGITAL URETEROSCOPE HOLMIUM LASER  . ESOPHAGOGASTRODUODENOSCOPY         Family History  Problem Relation Age of Onset  . Hypertension Mother   . Migraines Mother   . Thyroid disease Mother   . Hypertension Father   . Hyperlipidemia Father   . Diabetes Father   . Cancer Sister        thyroid  . CAD Paternal Grandfather     Social History   Tobacco Use  . Smoking status: Former Smoker    Packs/day: 0.50    Years: 10.00    Pack years: 5.00    Types: Cigarettes    Quit date: 10/27/2009    Years since quitting: 10.3  .  Smokeless tobacco: Former Systems developer    Types: Snuff  Substance Use Topics  . Alcohol use: Yes    Comment: occasional  . Drug use: No    Home Medications Prior to Admission medications   Medication Sig Start Date End Date Taking? Authorizing Provider  ALPRAZolam Duanne Moron) 1 MG tablet Take 1/2 - 1 tablet 2 - 3 x /day ONLY if needed for Anxiety Attack &  limit to 5 days /week to avoid Addiction & Dementia 02/23/20   Unk Pinto, MD  atorvastatin (LIPITOR) 80 MG tablet TAKE 1 TABLET BY MOUTH EVERY DAY 03/16/19   Unk Pinto, MD  cephALEXin (KEFLEX) 500 MG capsule Take 1 capsule 3 x  /day with Meals for Skin  Infection 02/01/20   Unk Pinto, MD  Cholecalciferol (VITAMIN D3) 2000 units TABS Take 10,000 tablets  by mouth daily.     [provider]  Cyanocobalamin (VITAMIN B 12 PO) Take 1 tablet by mouth daily.    [provider]  dexamethasone (DECADRON) 0.5 MG tablet Take 1 tablet PO BID for 5 days and then 1 tablet PO QDaily for 5 days. 03/01/20   Garnet Sierras, NP  OVER THE COUNTER MEDICATION Takes an OTC allergy tablets daily.    [provider]  silodosin (RAPAFLO) 8 MG CAPS capsule Take 1 capsule at Bedtime for Prostate 11/01/19   Unk Pinto, MD  Vilazodone HCl (VIIBRYD STARTER PACK) 10 & 20 MG KIT Take by mouth. TAKE 10 MGS FOR 7 DAYS THEN TAKE 40 MGS DAILY PER PT---REPORTED ON MARCH 12TH 2021    [provider]    Allergies    Codeine, Effexor [venlafaxine], Prednisone, Tramadol hcl, and Zinc  Review of Systems   Review of Systems  Constitutional: Positive for fatigue.  Respiratory: Positive for shortness of breath.   Cardiovascular: Positive for chest pain.  All other systems reviewed and are negative.   Physical Exam Updated Vital Signs BP 117/85   Pulse (!) 59   Temp 98.1 F (36.7 C)   Resp 19   Ht _0  (1.803 m)   Wt 117.9 kg   SpO2 96%   BMI 36.26 kg/m   Physical Exam Vitals reviewed.  Constitutional:      Appearance: He is well-developed.  Eyes:     Extraocular Movements: Extraocular movements intact.     Pupils: Pupils are equal, round, and reactive to light.  Cardiovascular:     Rate and Rhythm: Normal rate.     Heart sounds: Normal heart sounds.  Pulmonary:     Effort: Pulmonary effort is normal.     Breath sounds: Normal breath sounds.  Chest:     Chest wall: No mass or tenderness.  Abdominal:     Palpations: Abdomen is soft.  Musculoskeletal:        General: Normal range of motion.     Cervical back: Normal range of motion.  Skin:    General: Skin is warm.  Neurological:     General: No focal deficit present.     Mental Status: He is alert.  Psychiatric:        Mood and Affect: Mood normal.     ED  Results / Procedures / Treatments   Labs (all labs ordered are listed, but only abnormal results are displayed) Labs Reviewed  CBC - Abnormal; Notable for the following components:      Result Value   Platelets 80 (*)    All other components within normal limits  D-DIMER,  QUANTITATIVE (NOT AT [facility])  BASIC METABOLIC PANEL  TROPONIN I (HIGH SENSITIVITY)  TROPONIN I (HIGH SENSITIVITY)    EKG None  Radiology DG Chest 2 View  Result Date: 03/12/2020 CLINICAL DATA:  Chest pain. EXAM: CHEST - 2 VIEW COMPARISON:  Included portion from coronary CT 05/31/2018 FINDINGS: The cardiomediastinal contours are normal. Subsegmental atelectasis at the left lung base. Pulmonary vasculature is normal. No confluent consolidation, pleural effusion, or pneumothorax. No acute osseous abnormalities are seen. IMPRESSION: Subsegmental left basilar atelectasis. Electronically Signed   By: Keith Rake M.D.   On: 03/12/2020 14:37    Procedures Procedures (including critical care time)  Medications Ordered in ED Medications  sodium chloride flush (NS) 0.9 % injection 3 mL (has no administration in time range)    ED Course  I have reviewed the triage vital signs and the nursing notes.  Pertinent labs & imaging results that were available during my care of the patient were reviewed by me and considered in my medical decision making (see chart for details).    MDM Rules/Calculators/A&P                      Final Clinical Impression(s) / ED Diagnoses Final diagnoses:  Atypical chest pain    Rx / DC Orders ED Discharge Orders    None    Pt's care turned over to [facility] PA at 9pm    Sidney Ace 03/13/20 [id number]    Lajean Saver, MD 03/13/20 1454

## 2020-03-12 NOTE — Discharge Instructions (Addendum)
Follow up with Dr. Oneta Rack for recheck.   Schedule to see Cardiology for evaluation of your chest pain Follow up with the oncology team regarding your platelets.  Follow up with the urologist for further evaluation of your kidney lesion Return to the ER with any new, worsening, or concerning symptoms.

## 2020-03-12 NOTE — ED Triage Notes (Signed)
Patient c/o intermittent left chest pain and palpitations x 1 week. Patient c/o chills, dizziness and weakness of extremities.  Patient states he checked his pulse ox at home today and it was in the 80's.

## 2020-03-12 NOTE — ED Provider Notes (Signed)
  Physical Exam  BP (!) 135/99   Pulse 63   Temp 98.1 F (36.7 C)   Resp 17   Ht 5\' 11"  (1.803 m)   Wt 117.9 kg   SpO2 97%   BMI 36.26 kg/m   Physical Exam  Gen: appears nontoxic   ED Course/Procedures     Procedures  MDM   Pt signed out to me by , PA-C. Please see previous notes for further history  In brief, patient presenting for evaluation of chest pain.  He has also had weakness and has not been feeling well for several weeks.  His cardiac work-up was reassuring.  CTA was negative for cardiac abnormality.  However, did show a wedge-shaped lesion of the left kidney of unknown origin or significance.  Recommended correlation with a UA.  As such, UA is pending.  UA negative for blood or infection.  However considering abnormality of the kidney on CT, will have patient follow-up with urology.  Patient with thrombocytopenia, this is chronic but will have patient follow-up with oncology.  Discussed findings with patient, who is agreeable.  Discussed importance of follow-up.  At this time, patient appears safe for discharge.  Return precautions given.  Patient states he understands and agrees to plan.     Verline Lema, PA-C 03/12/20 2214    2215, MD 03/13/20 1127

## 2020-03-13 ENCOUNTER — Other Ambulatory Visit: Payer: Self-pay

## 2020-03-13 ENCOUNTER — Telehealth: Payer: Self-pay | Admitting: Physician Assistant

## 2020-03-13 DIAGNOSIS — N138 Other obstructive and reflux uropathy: Secondary | ICD-10-CM

## 2020-03-13 NOTE — Telephone Encounter (Signed)
I received a call from Mr. Mceachron wife to schedule a new hem appt for thrombocytopenia. He was seen in the ED. Mr. Lacock is scheduled to see Cassie on 3/17 at 1:30pm with labs at 1pm. Mrs. [place] is aware for her husband to arrive 15 minutes early.

## 2020-03-14 ENCOUNTER — Encounter: Payer: Self-pay | Admitting: Physician Assistant

## 2020-03-14 ENCOUNTER — Inpatient Hospital Stay: Payer: BC Managed Care – PPO

## 2020-03-14 ENCOUNTER — Inpatient Hospital Stay: Payer: BC Managed Care – PPO | Attending: Physician Assistant | Admitting: Physician Assistant

## 2020-03-14 ENCOUNTER — Encounter: Payer: Self-pay | Admitting: Internal Medicine

## 2020-03-14 ENCOUNTER — Other Ambulatory Visit: Payer: Self-pay

## 2020-03-14 ENCOUNTER — Other Ambulatory Visit: Payer: Self-pay | Admitting: Physician Assistant

## 2020-03-14 ENCOUNTER — Ambulatory Visit (INDEPENDENT_AMBULATORY_CARE_PROVIDER_SITE_OTHER): Payer: BC Managed Care – PPO | Admitting: Internal Medicine

## 2020-03-14 VITALS — BP 120/76 | HR 80 | Temp 97.0°F | Resp 18 | Ht 71.0 in | Wt 258.8 lb

## 2020-03-14 DIAGNOSIS — R079 Chest pain, unspecified: Secondary | ICD-10-CM | POA: Diagnosis not present

## 2020-03-14 DIAGNOSIS — Z808 Family history of malignant neoplasm of other organs or systems: Secondary | ICD-10-CM

## 2020-03-14 DIAGNOSIS — Z809 Family history of malignant neoplasm, unspecified: Secondary | ICD-10-CM | POA: Insufficient documentation

## 2020-03-14 DIAGNOSIS — M940 Chondrocostal junction syndrome [Tietze]: Secondary | ICD-10-CM | POA: Diagnosis not present

## 2020-03-14 DIAGNOSIS — D696 Thrombocytopenia, unspecified: Secondary | ICD-10-CM | POA: Diagnosis present

## 2020-03-14 DIAGNOSIS — Z87891 Personal history of nicotine dependence: Secondary | ICD-10-CM | POA: Insufficient documentation

## 2020-03-14 DIAGNOSIS — R5383 Other fatigue: Secondary | ICD-10-CM | POA: Diagnosis not present

## 2020-03-14 DIAGNOSIS — F419 Anxiety disorder, unspecified: Secondary | ICD-10-CM | POA: Diagnosis not present

## 2020-03-14 DIAGNOSIS — R898 Other abnormal findings in specimens from other organs, systems and tissues: Secondary | ICD-10-CM | POA: Insufficient documentation

## 2020-03-14 DIAGNOSIS — N138 Other obstructive and reflux uropathy: Secondary | ICD-10-CM

## 2020-03-14 LAB — CBC WITH DIFFERENTIAL (CANCER CENTER ONLY)
Abs Immature Granulocytes: 0.01 10*3/uL (ref 0.00–0.07)
Basophils Absolute: 0 10*3/uL (ref 0.0–0.1)
Basophils Relative: 1 %
Eosinophils Absolute: 0.2 10*3/uL (ref 0.0–0.5)
Eosinophils Relative: 3 %
HCT: 44.2 % (ref 39.0–52.0)
Hemoglobin: 15.8 g/dL (ref 13.0–17.0)
Immature Granulocytes: 0 %
Lymphocytes Relative: 20 %
Lymphs Abs: 1.4 10*3/uL (ref 0.7–4.0)
MCH: 32.6 pg (ref 26.0–34.0)
MCHC: 35.7 g/dL (ref 30.0–36.0)
MCV: 91.3 fL (ref 80.0–100.0)
Monocytes Absolute: 0.8 10*3/uL (ref 0.1–1.0)
Monocytes Relative: 11 %
Neutro Abs: 4.7 10*3/uL (ref 1.7–7.7)
Neutrophils Relative %: 65 %
Platelet Count: 187 10*3/uL (ref 150–400)
RBC: 4.84 MIL/uL (ref 4.22–5.81)
RDW: 11.7 % (ref 11.5–15.5)
WBC Count: 7.1 10*3/uL (ref 4.0–10.5)
nRBC: 0 % (ref 0.0–0.2)

## 2020-03-14 LAB — CMP (CANCER CENTER ONLY)
ALT: 42 U/L (ref 0–44)
AST: 23 U/L (ref 15–41)
Albumin: 4.4 g/dL (ref 3.5–5.0)
Alkaline Phosphatase: 79 U/L (ref 38–126)
Anion gap: 8 (ref 5–15)
BUN: 13 mg/dL (ref 6–20)
CO2: 29 mmol/L (ref 22–32)
Calcium: 9.5 mg/dL (ref 8.9–10.3)
Chloride: 104 mmol/L (ref 98–111)
Creatinine: 1.32 mg/dL — ABNORMAL HIGH (ref 0.61–1.24)
GFR, Est AFR Am: >60 mL/min (ref >60)
GFR, Estimated: >60 mL/min (ref >60)
Glucose, Bld: 109 mg/dL — ABNORMAL HIGH (ref 70–99)
Potassium: 4.2 mmol/L (ref 3.5–5.1)
Sodium: 141 mmol/L (ref 135–145)
Total Bilirubin: 1.1 mg/dL (ref 0.3–1.2)
Total Protein: 7.2 g/dL (ref 6.5–8.1)

## 2020-03-14 LAB — LACTATE DEHYDROGENASE: LDH: 169 U/L (ref 98–192)

## 2020-03-14 LAB — PLATELET BY CITRATE

## 2020-03-14 MED ORDER — ALPRAZOLAM 1 MG PO TABS: ORAL_TABLET | ORAL | 0 refills | Status: DC

## 2020-03-14 MED ORDER — ESCITALOPRAM OXALATE 20 MG PO TABS: ORAL_TABLET | ORAL | 3 refills | Status: DC

## 2020-03-14 MED ORDER — DEXAMETHASONE 2 MG PO TABS: ORAL_TABLET | ORAL | 0 refills | Status: DC

## 2020-03-14 MED ORDER — BUPROPION HCL ER (XL) 150 MG PO TB24: ORAL_TABLET | ORAL | 3 refills | Status: DC

## 2020-03-14 NOTE — Progress Notes (Signed)
Subjective:    Patient ID: Shane Saunders, male    DOB: 1971-03-28, 49 y.o.   MRN: [id number]  HPI      Patient is a 49 yo MWM who is seen emergently this am for evaluation of lower anterior Chest wall pain & anxiety.  Patient was evaluated 2 nights ago in the ER with same pain which was determined not cardiac & he ws released.    Patient recently had his Lexapro & Wellbutrin  Taper & stopped for concern of possible side effects or allergic reaction.  He became more anxious /depressed and saw psychiatrist Dr Toy Care who prescribed him Viibyrd which he took for 7 days til stopping 2 days ago because of perceived side-effects of muscle cramps, twitches , jerks, etc. He admits intense anxiety. Previously he had done well on the Lexapro & Wellbutrin w/o SE's.   Medication Sig  . atorvastatin (LIPITOR) 80 MG tablet TAKE 1 TABLET BY MOUTH EVERY DAY  . cetirizine (ZYRTEC) 10 MG tablet Take 10 mg by mouth daily.  . Cholecalciferol (VITAMIN D3) 2000 units TABS Take 10,000 tablets by mouth daily.   . Cyanocobalamin (VITAMIN B 12 PO) Take 1 tablet by mouth daily.  . fexofenadine-pseudoephedrine (ALLEGRA-D) 60-120 MG 12 hr tablet Take 1 tablet by mouth 2 (two) times daily.  Marland Kitchen gabapentin (NEURONTIN) 300 MG capsule Take 600 mg by mouth daily.  . silodosin (RAPAFLO) 8 MG CAPS capsule  Take 8 mg by mouth in the morning.   Marland Kitchen ALPRAZolam (XANAX) 1 MG tablet Take 1/2 - 1 tablet 2 - 3 x /day ONLY if needed for Anxiety Attack   . cephALEXin (KEFLEX) 500 MG capsule Take 1 capsule 3 x  /day with Meals for Skin  Infection  . dexamethasone (DECADRON) 0.5 MG tablet Take 1 tablet PO BID for 5 days and then 1 tablet PO QDaily for 5 days.    Allergies  Allergen Reactions  . Codeine Other (See Comments)    "can's sleep"  . Effexor [Venlafaxine] Other (See Comments)    ED  . Prednisone Other (See Comments)    "can't sleep"  . Tramadol Hcl Itching  . Zinc Other (See Comments)    "stomache upset"   Past Medical  History:  Diagnosis Date  . Abnormal glucose   . Borderline hypertension   . BPH (benign prostatic hypertrophy)   . Crushing injury of left elbow and forearm 07/13/2015  . Depression, major, in partial remission ([place]) 07/12/2015  . Dizziness   . Hyperlipidemia   . Hypertension   . Hypogonadism male   . 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 Morbid obesity ([place])   . Obesity (BMI 30-39.9)   . OSA on CPAP   . Prediabetes   . Right ureteral stone   . Sleep apnea    C PAP  . Vertigo   . Vitamin D deficiency   . Wears glasses    Review of Systems    10 point systems review negative except as above.    Objective:   Physical Exam  BP 120/76   Pulse 80   Temp (!) 97 F (36.1 C)   Resp 18   Ht 5\' 11"  (1.803 m)   Wt 258 lb 12.8 oz (117.4 kg)   BMI 36.10 kg/m   HEENT - WNL. Neck - supple.  Chest - Clear equal BS. (+) tender along lower para-sternal CC jts and lower anterior rib  margins.  Cor - Nl HS. RRR w/o sig MGR. PP 1(+). No edema. Abd- Soft with slight EG tenderness . No masses or guarding. MS- FROM w/o deformities.  Gait Nl. Neuro -  Nl w/o focal abnormalities.    Assessment & Plan:    1. Chest pain  - dexamethasone  2 MG tablet; Take 1 tab 3 x day - 3 days, then 2 x day - 3 days, then 1 tab daily  Disp: 20 ;   2. Acute costochondritis  - dexamethasone 2 MG tablet; Take 1 tab 3 x day - 3 days, then 2 x day - 3 days, then 1 tab daily  Disp: 20   3. Anxiety tension state  - Restart anxiety meds- escitalopram (LEXAPRO) 20 MG tablet; Take 1 tablet Daily for Mood  Dispense: 90 tablet; Refill: 3  - buPROPion (WELLBUTRIN XL) 150 MG 24 hr tablet; Take 1 tablet every Morning for Mood, Focus & Concentration  Dispense: 90 tablet; Refill: 3  - ALPRAZolam (XANAX) 1 MG tablet; Take 1/2 - 1 tablet 2 - 3 x /day ONLY if needed for Anxiety Attack &  limit to 5 days /week to avoid Addiction & Dementia  Dispense: 60 tablet; Refill: 0  - Discussed meds &  SE's  - Recc ROV 10 days

## 2020-03-14 NOTE — Progress Notes (Signed)
[facility] [phone]   [phone]  CONSULT NOTE  REFERRING PHYSICIAN: Sophia Caccavale PA-C  REASON FOR CONSULTATION:  Thrombocytopenia  HPI Shane Saunders is a 49 y.o. male with past medical history significant for obstructive sleep apnea, chronic fatigue, BPH, depression, hyperlipidemia, and obesity is referred to the clinic for evaluation of thrombocytopenia.  The patient was seen in the emergency room on March 12, 2020 for the chief complaint of chest pain.  While in the emergency room, he had a CBC performed which showed a platelet count of 80 K.  Upon reviewing his records dating back to 2014, it appears that the patient's platelet count always appears less than 100,000.  Comments noted on the peripheral smear note that the patient has clumping of his platelets.  The patient is referred to the clinic from the ER for evaluation of his platelets.  The patient is fatigued today.  He has been having a challenging time over the course of the last 2 years finding etiology of his persistent dizziness and lip swelling.  He is seen multiple specialist in the past for this concern including allergy, neurology, ENT, dermatology, etc. He has had thorough imaging and lab performed without a clear etiology of symptoms.  Regarding his platelet count, the patient denies any abnormal bleeding or bruising including nosebleeding, gum bleeding, hematochezia, hematuria, hemoptysis, hematemesis, or melena.  He has had several surgeries in the past without any bleeding complications.  He denies any known liver disease or autoimmune diseases.  He denies any herbal supplements.  He denies taking any blood thinners or aspirin.  He denies any significant ibuprofen use and generally takes Tylenol.  He denies any vitamin deficiencies except for he takes supplemental vitamin B12.  Denies any travel to any endemic areas.   The patient's family history consists of his mother who  had some mental illness as well as malignancy of unknown primary.  The patient sister has thyroid cancer.  The patient's father has diabetes mellitus.  The patient denies any family history of any hematologic abnormalities.   The patient is a former Psychologist, occupational.  He is unemployed at this time.  He is married with 2 children.  The patient denies any current alcohol use.  He is a former smoker having smoked "on and off" for the last 35 years and chews tobacco.  Denies any drug use.   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   Morbid obesity (HCC)    Obesity (BMI 30-39.9)    OSA on CPAP    Prediabetes    Right ureteral stone    Sleep apnea    C PAP   Vertigo    Vitamin D deficiency    Wears glasses     Past Surgical History:  Procedure Laterality Date   ABDOMINAL EXPLORATION SURGERY  1998   Celiotomy and Appendectomy /  (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, congenital)   ARTERY REPAIR Right 05/31/2014   Procedure: IRRIGATION, EXPLORATION, AND REPAIR OF RIGHT ARM WOUND;  Surgeon: Cherylynn Ridges, MD;  Location: [facility] OR;  Service: General;  Laterality: Right;   CYSTOSCOPY WITH RETROGRADE PYELOGRAM, URETEROSCOPY AND STENT PLACEMENT Right 07/11/2016   Procedure: CYSTOSCOPY WITH RETROGRADE PYELOGRAM, URETEROSCOPY, BASKET STONE EXTRACTION  AND STENT PLACEMENT;  Surgeon: Sebastian Ache, MD;  Location: [facility];  Service: Urology;  Laterality: Right;  45 MINS  C-ARM DIGITAL URETEROSCOPE HOLMIUM LASER   ESOPHAGOGASTRODUODENOSCOPY      Family History  Problem Relation Age of Onset   Hypertension Mother    Migraines Mother     Thyroid disease Mother    Hypertension Father    Hyperlipidemia Father    Diabetes Father    Cancer Sister        thyroid   CAD Paternal Grandfather     Social History Social History   Tobacco Use   Smoking status: Former Smoker    Packs/day: 0.50    Years: 10.00    Pack years: 5.00    Types: Cigarettes    Quit date: 10/27/2009    Years since quitting: 10.3   Smokeless tobacco: Former Neurosurgeon    Types: Snuff  Substance Use Topics   Alcohol use: Yes    Comment: occasional   Drug use: No    Allergies  Allergen Reactions   Codeine Other (See Comments)    "can's sleep"   Effexor [Venlafaxine] Other (See Comments)    ED   Prednisone Other (See Comments)    "can't sleep"   Tramadol Hcl Itching   Zinc Other (See Comments)    "stomache upset"    Current Outpatient Medications  Medication Sig Dispense Refill   ALPRAZolam (XANAX) 1 MG tablet Take 1/2 - 1 tablet 2 - 3 x /day ONLY if needed for Anxiety Attack &  limit to 5 days /week to avoid Addiction & Dementia 60 tablet 0   atorvastatin (LIPITOR) 80 MG tablet TAKE 1 TABLET BY MOUTH EVERY DAY 90 tablet 1   buPROPion (WELLBUTRIN XL) 150 MG 24 hr tablet Take 1 tablet every Morning for Mood, Focus & Concentration 90 tablet 3   cetirizine (ZYRTEC) 10 MG tablet Take 10 mg by mouth daily.     Cholecalciferol (VITAMIN D3) 2000 units TABS Take 10,000 tablets by mouth daily.      Cyanocobalamin (VITAMIN B 12 PO) Take 1 tablet by mouth daily.     escitalopram (LEXAPRO) 20 MG tablet Take 1 tablet Daily for Mood 90 tablet 3   fexofenadine-pseudoephedrine (ALLEGRA-D) 60-120 MG 12 hr tablet Take 1 tablet by mouth 2 (two) times daily.     gabapentin (NEURONTIN) 300 MG capsule Take 600 mg by mouth daily.     silodosin (RAPAFLO) 8 MG CAPS capsule Take 1 capsule at Bedtime for Prostate (Patient taking differently: Take 8 mg by mouth in the morning. Take 1 capsule at Bedtime for Prostate  Pt takes in the morning) 90  capsule 3   No current facility-administered medications for this visit.    Review of Systems   Review of Systems  Constitutional: Positive for fatigue and weight loss. Negative for appetite change, chills, and fever.  HENT: Negative for mouth sores, nosebleeds, sore throat and trouble swallowing.   Eyes: Negative for eye problems and icterus.  Respiratory: Negative for cough, hemoptysis, shortness of breath and wheezing.  Cardiovascular: Negative for chest pain and leg swelling.  Gastrointestinal: Negative for abdominal pain, constipation, diarrhea, nausea and vomiting.  Genitourinary: Negative for bladder incontinence, difficulty urinating, dysuria, frequency and hematuria.   Musculoskeletal: Negative for back pain, gait problem, neck  pain and neck stiffness.  Skin: Positive for occasional lip swelling.  Neurological: Positive for dizziness, light headedness, gait disturbances, and headaches. Negative for seizures.  Hematological: Negative for adenopathy. Does not bruise/bleed easily.  Psychiatric/Behavioral: Negative for confusion, depression and sleep disturbance. The patient is not nervous/anxious.     PHYSICAL EXAMINATION:  Blood pressure 133/83, pulse 79, temperature 98 F (36.7 C), temperature source Oral, resp. rate 18, height 5\' 11"  (1.803 m), weight 258 lb 11.2 oz (117.3 kg), SpO2 99 %.  ECOG PERFORMANCE STATUS: 0  Physical Exam  Constitutional: Oriented to person, place, and time and well-developed, well-nourished, and in no distress.  HENT:  Head: Normocephalic and atraumatic.  Mouth/Throat: Oropharynx is clear and moist. No oropharyngeal exudate.  Eyes: Conjunctivae are normal. Right eye exhibits no discharge. Left eye exhibits no discharge. No scleral icterus.  Neck: Normal range of motion. Neck supple.  Cardiovascular: Normal rate, regular rhythm, normal heart sounds and intact distal pulses.   Pulmonary/Chest: Effort normal and breath sounds normal. No  respiratory distress. No wheezes. No rales.  Abdominal: Soft. Bowel sounds are normal. Exhibits no distension and no mass. There is no tenderness.  Musculoskeletal: Normal range of motion. Exhibits no edema.  Lymphadenopathy:    No cervical adenopathy.  Neurological: Alert and oriented to person, place, and time. Exhibits normal muscle tone. Gait normal. Coordination normal.  Skin: Skin is warm and dry. No rash noted. Not diaphoretic. No erythema. No pallor.  Psychiatric: Mood, memory and judgment normal.  Vitals reviewed.  LABORATORY DATA: Lab Results  Component Value Date   WBC 7.1 03/14/2020   HGB 15.8 03/14/2020   HCT 44.2 03/14/2020   MCV 91.3 03/14/2020   PLT 187 03/14/2020      Chemistry      Component Value Date/Time   NA 141 03/14/2020 1250   NA 140 05/25/2018 0941   K 4.2 03/14/2020 1250   CL 104 03/14/2020 1250   CO2 29 03/14/2020 1250   BUN 13 03/14/2020 1250   BUN 10 05/25/2018 0941   CREATININE 1.32 (H) 03/14/2020 1250   CREATININE 1.27 03/09/2020 1238      Component Value Date/Time   CALCIUM 9.5 03/14/2020 1250   ALKPHOS 79 03/14/2020 1250   AST 23 03/14/2020 1250   ALT 42 03/14/2020 1250   BILITOT 1.1 03/14/2020 1250       RADIOGRAPHIC STUDIES: DG Chest 2 View  Result Date: 03/12/2020 CLINICAL DATA:  Chest pain. EXAM: CHEST - 2 VIEW COMPARISON:  Included portion from coronary CT 05/31/2018 FINDINGS: The cardiomediastinal contours are normal. Subsegmental atelectasis at the left lung base. Pulmonary vasculature is normal. No confluent consolidation, pleural effusion, or pneumothorax. No acute osseous abnormalities are seen. IMPRESSION: Subsegmental left basilar atelectasis. Electronically Signed   By: 07/31/2018 M.D.   On: 03/12/2020 14:37   CT Angio Chest/Abd/Pel for Dissection W and/or Wo Contrast  Result Date: 03/12/2020 CLINICAL DATA:  Nonspecific chest pain and palpitations for the past week. EXAM: CT ANGIOGRAPHY CHEST, ABDOMEN AND PELVIS  TECHNIQUE: Multidetector CT imaging through the chest, abdomen and pelvis was performed using the standard protocol prior to and during bolus administration of intravenous contrast. Multiplanar reconstructed images and MIPs were obtained and reviewed to evaluate the vascular anatomy. CONTRAST:  03/14/2020 OMNIPAQUE IOHEXOL 350 MG/ML SOLN COMPARISON:  CT angiography of the right upper extremity. FINDINGS: CTA CHEST FINDINGS Cardiovascular: Upper limits of normal heart size. Mild right main coronary calcification. Normal caliber of the thoracic aorta without calcified  atherosclerosis. Extensive cardiac motion precludes exclusion of dissection at the aortic root and ascending aorta. Normal contrast enhancement of the aortic arch, descending thoracic aorta, and great vessel origins without dissection. Mediastinum/Nodes: Normal. Lungs/Pleura: Minimal subpleural atelectasis. No dependent pleural effusion. Musculoskeletal: Minimal degenerative change. Review of the MIP images confirms the above findings. CTA ABDOMEN AND PELVIS FINDINGS VASCULAR Aorta: Normal caliber and contrast enhancement of the abdominal aorta and bilateral iliac vessels without dissection or other aortic injury. Wide patency of the major abdominal arteries. Celiac: Widely patent. SMA: Widely patent. Renals: Widely patent. IMA: Widely patent. Inflow: Widely patent. 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 Admixture of unenhanced and enhanced blood within the portal venous system. A venous thrombus is not excluded on the current contrast bolus timing. Review of the MIP images confirms the above findings. NON-VASCULAR Hepatobiliary: Normal. Pancreas: Normal. Spleen: Mild splenomegaly measuring 15.6 x 5.7 cm. Adrenals/Urinary Tract: Normal bilateral adrenal glands. Mild medical renal disease. Normal ureters and bilateral renal sizes and cortical thickness. Focal wedge-shaped non-enhancement of the left  renal midpole, which could be infectious or secondary to focal embolic type ischemia, or less likely an underlying parenchymal lesion. Stomach/Bowel: Mild circumferential wall thickening of the redundant transverse colon in the mid abdomen. Subtle pericolonic inflammatory change without bowel perforation. No bowel obstruction. No pericolonic abscess. No gastroenteric wall thickening. Minimal diverticulosis without acute diverticulitis. Lymphatic: No adenopathy. Reproductive: No apparent abnormality. Other: Tiny periumbilical herniation of fat without strangulation or incarceration. Musculoskeletal: Minimal degenerative change. Benign bone islands in the right femur. Review of the MIP images confirms the above findings. IMPRESSION: Extensive cardiac motion precludes exclusion of dissection at the aortic root and ascending aorta; however the current abnormal enhancement in these regions could be due entirely to cardiac motion. No dissection or aortic injury of the aortic arch, descending thoracic aorta, abdominal aorta or iliac arteries. Mild uncomplicated colitis of the transverse colon. Correlation with colonoscopy recommended as clinically indicated to exclude an underlying mucosal malignancy. Focal wedge-shaped non-enhancement of the left renal cortex, which could be infectious or secondary to embolic-type infarction or underlying parenchymal lesion. Correlation with urinalysis could be useful. Mild right main coronary calcification. 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    PLAN: The patient was seen with Dr. Arbutus PedMohamed today.  The patient had a CBC, CMP, LDH, anda platelet count by citrate due to his history of platelet clumping.  His platelet count was within normal limits at 187K.  LDH and CMP unremarkable.  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  No further work-up is necessary.   We will see the patient on an as-needed basis.   The patient voices understanding of current disease status and treatment options and is in agreement with the current care plan.  All questions were answered. The patient knows to call the clinic with any problems, questions or concerns. We can certainly see the patient much sooner if necessary.  Thank you so much for allowing me to participate in the care of Shane Saunders. I will continue to follow up the patient with you and assist in his care.  The total time spent in the appointment was 60 minutes.  Disclaimer: This note was dictated with voice recognition software. Similar sounding words can inadvertently be transcribed and  may not be corrected upon review.   Eylin Pontarelli L Iseah Plouff March 14, 2020, 2:02 PM  ADDENDUM: Hematology/Oncology Attending: I had a face-to-face encounter with the patient today.  I recommended his care plan.  This is a very pleasant 49 years old white male who has been dealing with swelling of the lips as well as increasing fatigue and weakness recently.  He had several studies by his primary care physician as well as neurologist and allergy medicine that were not revealing of any significant findings. During his evaluation he had CBC that showed persistent thrombocytopenia with platelet clumping. The patient is here today for evaluation regarding this abnormality. When seen today he has no concerning complaints except for the generalized fatigue. I order repeat CBC with platelets count with citrate which showed normal platelets count of 187,000. His thrombocytopenia was artificial secondary to antibody from the EDTA and the collection tube. I assured the patient that he has no concerning abnormality on his CBC today. I recommended for him to continue on observation with routine follow-up visit by his primary care physician  at this point. He was advised to call if he has any other hematologic issues in the future. The patient and his wife agreed to the current plan.  Disclaimer: This note was dictated with voice recognition software. Similar sounding words can inadvertently be transcribed and may be missed upon review. Eilleen Kempf, MD 03/14/20

## 2020-03-15 ENCOUNTER — Telehealth: Payer: Self-pay | Admitting: Neurology

## 2020-03-15 NOTE — Telephone Encounter (Signed)
Pt wife called to check on the status of the pts sleep study results

## 2020-03-15 NOTE — Telephone Encounter (Signed)
Sleep study was completed on 03/05/2020 in lab. Dr Vickey Huger will read sleep study soon as soon as I have the results I will be in contact with the patient to review. I will let the Dr know that they are inquiring.

## 2020-03-16 DIAGNOSIS — G4733 Obstructive sleep apnea (adult) (pediatric): Secondary | ICD-10-CM | POA: Insufficient documentation

## 2020-03-16 NOTE — Progress Notes (Signed)
IMPRESSION:   1. Severest and mostly Obstructive Sleep Apnea (OSA)at AHI of  80.6/h with few central apneas.  2. Sleep hypoxemia, intermittent, due to apnea- Total  desaturation time was 63 minutes.  3. Affected sleep architecture, fragmented sleep without CPAP  use.   4. Primary Snoring   RECOMMENDATIONS: autotitration capable CPAP machine with a device  setting from 6-19 cm water, 3 cm EPR and heated humidity, mask of  patient's comfort- please fit at DME !!!

## 2020-03-16 NOTE — Addendum Note (Signed)
Addended by: Melvyn Novas on: 03/16/2020 04:17 PM   Modules accepted: Orders

## 2020-03-16 NOTE — Procedures (Signed)
PATIENT'S NAME:  Shane Saunders, Shane Saunders. DOB:      Feb 12, 1971      MR#:    [id number]     DATE OF RECORDING: 03/05/2020 REFERRING M.D.:  Shane Cowboy, MD Study Performed:   Baseline Polysomnogram HISTORY:  Shane Saunders is a 49 y.o. Caucasian male patient seen on 02/13/2020 from Dr. Oneta Saunders. Chief concern according to patient:   ' I am frustrated, having 2 years symptoms of dizziness - and my CPAP needs to be replaced."  Shane Saunders today, a right -handed White or Caucasian male with OSA has a past medical history of Abnormal glucose, Borderline hypertension, BPH (benign prostatic hypertrophy), Crushing injury of left elbow and forearm (07/13/2015), Depression, major, in partial remission (HCC) (07/12/2015), Dizziness, Hyperlipidemia, Hypertension, Hypogonadism male, Malrotation of colon, Morbid obesity (HCC), OSA on CPAP, Prediabetes, Right ureteral stone, Vertigo, Vitamin D deficiency.    2 years ago, the patient reportedly had onset of dizziness and intermittent right lip swelling.  His dizziness is described as a feeling off- balance, as lightheadedness, brain fog and fatigue.  He denies any spinning or vertigo sensations.  However, he did follow-up with ENT, apparently had some vestibular testing showing dysfunction.  He then underwent vestibular therapy without relief. Dr. Marjory Lies has been consulted , he ordered imaging studies.  Also was having intermittent right upper lip swelling attacks lasting a few hours at a time and now more consistent. He went to ENT and dermatology for evaluation without receiving a specific diagnosis.  Allergy /immunology consult was considered but has not been pursued yet.  He had variety of tests for autoimmune inflammatory etiologies which have been unremarkable.   He has been referred for a new CPAP- his wife has recorded him stopping to breath for 27 seconds a long time, snores loudly- just this last weekend his house lost power and his sleep was miserable.   The  patient had the first sleep study in the year 2007 at [facility], with a result of severe OSA and loud snoring.  Even while using CPAP he endorsed the Epworth sleepiness score at 19 out of 24 points which is a very high scale.  His fatigue severity scale is equally high at 59 out of 63. He has frequent nasal CONGESTION, SINUSITIS, COUGHIN and PHLEGM-  He reports not feeling refreshed or restored in AM, with symptoms such as dry mouth, morning headaches when he had no CPAP available, and residual fatigue.   The patient endorsed the Epworth Sleepiness Scale at 19/24 points.   The patient's weight 270 pounds with a height of 70 (inches), resulting in a BMI of 38.5 kg/m2. The patient's neck circumference measured 19.5 inches.  CURRENT MEDICATIONS: Xanax, Lipitor, Keflex, Vitamin D3, Vitamin B12, Neurontin, Papflo.   PROCEDURE:  This is a multichannel digital polysomnogram utilizing the Somnostar 11.2 system.  Electrodes and sensors were applied and monitored per AASM Specifications.   EEG, EOG, Chin and Limb EMG, were sampled at 200 Hz.  ECG, Snore and Nasal Pressure, Thermal Airflow, Respiratory Effort, CPAP Flow and Pressure, Oximetry was sampled at 50 Hz. Digital video and audio were recorded.      BASELINE STUDY : Lights Out was at 21:34 and Lights On at 05:00.  Total recording time (TRT) was 446 minutes, with a total sleep time (TST) of 259 minutes.   The patient's sleep latency was 50 minutes.  REM latency was 172 minutes.  The sleep efficiency was 58.1 %.     SLEEP  ARCHITECTURE: WASO (Wake after sleep onset) was 140.5 minutes. There were 20 minutes in Stage N1, 219 minutes Stage N2, 0 minutes Stage N3 and 20 minutes in Stage REM.  The percentage of Stage N1 was 7.7%, Stage N2 was 84.6%, Stage N3 was 0% and Stage R (REM sleep) was 7.7%.   RESPIRATORY ANALYSIS:  There were a total of 348 respiratory events:  232 obstructive apneas, 33 central apneas and 0 mixed apneas with a total of 265 apneas and  an apnea index (AI) of 61.4 /hour. There were 83 hypopneas with a hypopnea index of 19.2 /hour. The patient also had 25 respiratory event related arousals (RERAs).The total APNEA/HYPOPNEA INDEX (AHI) was 80.6/hour.  23 events occurred in REM sleep and 179 events in NREM. The REM AHI was 69 /hour, versus a non-REM AHI of 81.6. The patient spent 41 minutes of total sleep time in the supine position and 218 minutes in non-supine. The supine AHI was 90.7 versus a non-supine AHI of 78.7/h.  OXYGEN SATURATION & C02:  The Wake baseline 02 saturation was 93%, with the lowest being 72%. Time spent below 89% saturation equaled 63 minutes. The arousals were noted as: 59 were spontaneous, 0 were associated with PLMs, and 248 were associated with respiratory events. The patient had a total of 0 Periodic Limb Movements.  The Periodic Limb Movement (PLM) index was 0 and the PLM Arousal index was 0/hour. Audio and video analysis did not show any abnormal or unusual movements, behaviors, phonations or vocalizations.   Loud Snoring was noted. EKG was in keeping with normal sinus rhythm (NSR).  IMPRESSION:  1. Severest and mostly Obstructive Sleep Apnea (OSA)at AHI of 80.6/h with few central apneas. 2. Sleep hypoxemia, intermittent, due to apnea- Total desaturation time was 63 minutes.  3. Affected sleep architecture, fragmented sleep without CPAP use.   4. Primary Snoring  RECOMMENDATIONS: autotitration capable CPAP machine with a device setting from 6-19 cm water, 3 cm EPR and heated humidity, mask of patient's comfort- please fit at DME !!!  I certify that I have reviewed the entire raw data recording prior to the issuance of this report in accordance with the Standards of Accreditation of the American Academy of Sleep Medicine (AASM)  Larey Seat, MD Diplomat, American Board of Psychiatry and Neurology  Diplomat, American Board of Sleep Medicine Market researcher, [facility] [employer]

## 2020-03-19 ENCOUNTER — Telehealth: Payer: Self-pay | Admitting: Neurology

## 2020-03-19 ENCOUNTER — Encounter: Payer: Self-pay | Admitting: Neurology

## 2020-03-19 NOTE — Telephone Encounter (Signed)
I called pt. I advised pt that Dr. Vickey Huger reviewed their sleep study results and found that pt has severe sleep apnea. Dr. Vickey Huger recommends that pt starts auto CPAP. I reviewed PAP compliance expectations with the pt. Pt is agreeable to starting a CPAP. I advised pt that an order will be sent to a DME, [facility], and [facility] will call the pt within about one week after they file with the pt's insurance. [facility] will show the pt how to use the machine, fit for masks, and troubleshoot the CPAP if needed. A follow up appt was made for insurance purposes with Shawnie Dapper, NP on June 7,2021 at 9 am with Amy Lomax. Pt verbalized understanding to arrive 15 minutes early and bring their CPAP. A letter with all of this information in it will be mailed to the pt as a reminder. I verified with the pt that the address we have on file is correct. Pt verbalized understanding of results. Pt had no questions at this time but was encouraged to call back if questions arise. I have sent the order to [facility] and have received confirmation that they have received the order.

## 2020-03-19 NOTE — Telephone Encounter (Signed)
Pt called back in regards to missed call  

## 2020-03-19 NOTE — Telephone Encounter (Signed)
Was read on 3-18/19 .

## 2020-03-19 NOTE — Telephone Encounter (Signed)
Called the patient. No answer. Unable to leave a detailed message. LVM asking the patient to return call.

## 2020-03-19 NOTE — Telephone Encounter (Signed)
-----   Message from Melvyn Novas, MD sent at 03/16/2020  4:17 PM EDT ----- IMPRESSION:   1. Severest and mostly Obstructive Sleep Apnea (OSA)at AHI of  80.6/h with few central apneas.  2. Sleep hypoxemia, intermittent, due to apnea- Total  desaturation time was 63 minutes.  3. Affected sleep architecture, fragmented sleep without CPAP  use.   4. Primary Snoring   RECOMMENDATIONS: autotitration capable CPAP machine with a device  setting from 6-19 cm water, 3 cm EPR and heated humidity, mask of  patient's comfort- please fit at DME !!!

## 2020-03-21 ENCOUNTER — Ambulatory Visit (INDEPENDENT_AMBULATORY_CARE_PROVIDER_SITE_OTHER): Payer: BC Managed Care – PPO | Admitting: Internal Medicine

## 2020-03-21 ENCOUNTER — Encounter: Payer: Self-pay | Admitting: Internal Medicine

## 2020-03-21 ENCOUNTER — Other Ambulatory Visit: Payer: Self-pay

## 2020-03-21 VITALS — BP 124/82 | HR 64 | Temp 97.4°F | Resp 16 | Ht 71.0 in | Wt 255.8 lb

## 2020-03-21 DIAGNOSIS — L739 Follicular disorder, unspecified: Secondary | ICD-10-CM

## 2020-03-21 DIAGNOSIS — R2681 Unsteadiness on feet: Secondary | ICD-10-CM

## 2020-03-21 DIAGNOSIS — R279 Unspecified lack of coordination: Secondary | ICD-10-CM

## 2020-03-21 DIAGNOSIS — G4489 Other headache syndrome: Secondary | ICD-10-CM | POA: Diagnosis not present

## 2020-03-21 DIAGNOSIS — Z79899 Other long term (current) drug therapy: Secondary | ICD-10-CM

## 2020-03-21 DIAGNOSIS — F09 Unspecified mental disorder due to known physiological condition: Secondary | ICD-10-CM

## 2020-03-21 MED ORDER — CETIRIZINE-PSEUDOEPHEDRINE ER 5-120 MG PO TB12: ORAL_TABLET | ORAL | Status: DC

## 2020-03-21 MED ORDER — DOXYCYCLINE HYCLATE 100 MG PO CAPS: ORAL_CAPSULE | ORAL | 0 refills | Status: DC

## 2020-03-21 MED ORDER — GABAPENTIN 600 MG PO TABS: ORAL_TABLET | ORAL | 0 refills | Status: DC

## 2020-03-21 MED ORDER — FEXOFENADINE HCL 60 MG PO TABS: ORAL_TABLET | ORAL | Status: DC

## 2020-03-21 MED ORDER — CYCLOBENZAPRINE HCL 10 MG PO TABS: ORAL_TABLET | ORAL | 0 refills | Status: DC

## 2020-03-21 NOTE — Patient Instructions (Signed)
General Headache Without Cause A headache is pain or discomfort felt around the head or neck area. The specific cause of a headache may not be found. There are many causes and types of headaches. A few common ones are:  Tension headaches.  Migraine headaches.  Cluster headaches.  Chronic daily headaches. Follow these instructions at home: Watch your condition for any changes. Let your health care provider know about them. Take these steps to help with your condition: Managing pain      Take over-the-counter and prescription medicines only as told by your health care provider.  Lie down in a dark, quiet room when you have a headache.  If directed, put ice on your head and neck area: ? Put ice in a plastic bag. ? Place a towel between your skin and the bag. ? Leave the ice on for 20 minutes, 2-3 times per day.  If directed, apply heat to the affected area. Use the heat source that your health care provider recommends, such as a moist heat pack or a heating pad. ? Place a towel between your skin and the heat source. ? Leave the heat on for 20-30 minutes. ? Remove the heat if your skin turns bright red. This is especially important if you are unable to feel pain, heat, or cold. You may have a greater risk of getting burned.  Keep lights dim if bright lights bother you or make your headaches worse. Eating and drinking  Eat meals on a regular schedule.  If you drink alcohol: ? Limit how much you use to:  0-1 drink a day for women.  0-2 drinks a day for men. ? Be aware of how much alcohol is in your drink. In the [place] one drink equals one 12 oz bottle of beer (355 mL), one 5 oz glass of wine (148 mL), or one 1 oz glass of hard liquor (44 mL).  Stop drinking caffeine, or decrease the amount of caffeine you drink. General instructions   Keep a headache journal to help find out what may trigger your headaches. For example, write down: ? What you eat and drink. ? How much  sleep you get. ? Any change to your diet or medicines.  Try massage or other relaxation techniques.  Limit stress.  Sit up straight, and do not tense your muscles.  Do not use any products that contain nicotine or tobacco, such as cigarettes, e-cigarettes, and chewing tobacco. If you need help quitting, ask your health care provider.  Exercise regularly as told by your health care provider.  Sleep on a regular schedule. Get 7-9 hours of sleep each night, or the amount recommended by your health care provider.  Keep all follow-up visits as told by your health care provider. This is important. Contact a health care provider if:  Your symptoms are not helped by medicine.  You have a headache that is different from the usual headache.  You have nausea or you vomit.  You have a fever. Get help right away if:  Your headache becomes severe quickly.  Your headache gets worse after moderate to intense physical activity.  You have repeated vomiting.  You have a stiff neck.  You have a loss of vision.  You have problems with speech.  You have pain in the eye or ear.  You have muscular weakness or loss of muscle control.  You lose your balance or have trouble walking.  You feel faint or pass out.  You have confusion.    You have a seizure. Summary  A headache is pain or discomfort felt around the head or neck area.  There are many causes and types of headaches. In some cases, the cause may not be found.  Keep a headache journal to help find out what may trigger your headaches. Watch your condition for any changes. Let your health care provider know about them.  Contact a health care provider if you have a headache that is different from the usual headache, or if your symptoms are not helped by medicine.  Get help right away if your headache becomes severe, you vomit, you have a loss of vision, you lose your balance, or you have a seizure.

## 2020-03-21 NOTE — Progress Notes (Signed)
Subjective:    Patient ID: Shane Saunders, male    DOB: Oct 16, 1971, 49 y.o.   MRN: [id number]  HPI        Patient is a 49 yo MWM presenting emergently for c/o HA's. Who has has a myriad of issues recently including intermittent swelling of lips (neg lip bx's), a variety of soft neurologic symptoms relating to poor coordination, balance, visual difficulties, negative extensive w/u to r/o Autoimmune disease and was recently suspected to have a dysphoric reaction from [place] - which he stopped & was restarted back on Lexapro and Wellbutrin with Alprazolam to help manage his acute & chronic anxiety. Recent sleep studies per Dr Brett Fairy confirmed Severe OSA and new CPAP ordered. Patient has seen multiple specialists over the last several months and has been referred back to Dr Leta Baptist (app't next month) for consideration of possible MS.       10 days ago patient was  Rx'd a Decadron 4 mg taper for Costochondritis, but unfortunately he did not start the medication. He now presents with 5 day hx/o HA which he describes as "starting in the back of his head/neck and spreading up over the top of his head.  HA is described as aching and waxes & wanes in intensity from 0 to 2 to 5. Occasionally awakens him from sleep.  Describes paresthesias of his distal upper & lower extremities from elbows to hands & knees to feet. Ice packs help his neck pain & HA's.  Denies visual aura or  N/V. Medication Sig  . ALPRAZolam ( 1 MG tablet Take 1/2 - 1 tablet 2 - 3 x /day ONLY if needed   . atorvastatin 80 MG tablet TAKE 1 TABLET  EVERY DAY  . WELLBUTRIN XL 150 MG 24 hr tablet Take 1 tablet every Morning   . VITAMIN D 2000 units Take 10,000  daily.   Marland Kitchen VITAMIN B 12 tab Take 1 tablet  daily.  Marland Kitchen dexamethasone  2 MG tablet Take 1 tab 3 x day - 3 days, then 2 x day - 3 days, then 1 tab daily  . escitalopram (LEXAPRO) 20 MG tablet Take 1 tablet Daily for Mood  . ALLEGRA-D 60-120 MG 12 hr tab Take 1 tablet  2  times daily.    . silodosin (RAPAFLO) 8 MG CAPS capsule takes in the morning  . cetirizine (ZYRTEC) 10 MG tablet Take  daily.  Marland Kitchen gabapentin (NEURONTIN) 300 MG capsule Take 600 mg by mouth daily.   Allergies  Allergen Reactions  . Codeine Other (See Comments)    "can's sleep"  . Effexor [Venlafaxine] Other (See Comments)    ED  . Prednisone Other (See Comments)    "can't sleep"  . Tramadol Hcl Itching  . Zinc Other (See Comments)    "stomache upset"   Past Medical History:  Diagnosis Date  . Abnormal glucose   . Borderline hypertension   . BPH (benign prostatic hypertrophy)   . Crushing injury of left elbow and forearm 07/13/2015  . Depression, major, in partial remission ([place]) 07/12/2015  . Dizziness   . Hyperlipidemia   . Hypertension   . Hypogonadism male   . 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 Morbid obesity ([place])   . Obesity (BMI 30-39.9)   . OSA on CPAP   . Prediabetes   . Right ureteral stone   . Sleep apnea    C PAP  . Vertigo   .  Vitamin D deficiency   . Wears glasses    Past Surgical History:  Procedure Laterality Date  . 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 ARTERY REPAIR Right 05/31/2014   Procedure: IRRIGATION, EXPLORATION, AND REPAIR OF RIGHT ARM WOUND;  Surgeon: Gwenyth Ober, MD;  Location: [place];  Service: General;  Laterality: Right;  . CYSTOSCOPY WITH RETROGRADE PYELOGRAM, URETEROSCOPY AND STENT PLACEMENT Right 07/11/2016   Procedure: CYSTOSCOPY WITH RETROGRADE PYELOGRAM, URETEROSCOPY, BASKET STONE EXTRACTION  AND STENT PLACEMENT;  Surgeon: Alexis Frock, MD;  Location: [facility];  Service: Urology;  Laterality: Right;  45 MINS  C-ARM DIGITAL URETEROSCOPE HOLMIUM LASER  . ESOPHAGOGASTRODUODENOSCOPY      Review of Systems     Objective:   Physical Exam  BP 124/82   Pulse 64   Temp (!) 97.4 F (36.3 C)   Resp 16   Ht '5\' 11"'$  (1.803 m)    Wt 255 lb 12.8 oz (116 kg)   BMI 35.68 kg/m   HEENT - WNL. Neck - supple.  Chest - Clear equal BS. Cor - Nl HS. RRR w/o sig MGR. PP 1(+). No edema. MS- FROM w/o deformities.  Gait sl unstable. Neuro -  Nl w/o focal abnormalities. Skin - several boils over nape of neck /occipital hairline    Assessment & Plan:    1. Headache syndrome  - cyclobenzaprine  10 MG tab; Take 1/2 to 1 tablet 3 x /day as needed for Muscle Spasm or headache  Disp: 30 tab - gabapentin 600 MG tab; Take 1/2 to 1 tablet 1 hour before Bedtime Disp: 30 tab  2. Folliculitis  - doxycycline 100 MG capsule; Take 1 capsule 2 x /day with meals for Infection  Dispense: 60 capsule; Refill: 0  3. Cognitive dysfunction  - B. burgdorfi antibodies - RPR  4. Incoordination  5. Unstable gait  6. Medication management  Between 25-30 minutes of counseling, chart review,  exam and critical decision making was performed

## 2020-03-22 ENCOUNTER — Ambulatory Visit: Payer: BC Managed Care – PPO | Admitting: Internal Medicine

## 2020-03-22 LAB — B. BURGDORFI ANTIBODIES: B burgdorferi Ab IgG+IgM: <0.9 index

## 2020-03-22 LAB — RPR: RPR Ser Ql: NONREACTIVE

## 2020-03-23 ENCOUNTER — Ambulatory Visit: Payer: BC Managed Care – PPO | Admitting: Cardiovascular Disease

## 2020-03-23 ENCOUNTER — Encounter: Payer: Self-pay | Admitting: Cardiovascular Disease

## 2020-03-23 ENCOUNTER — Encounter: Payer: Self-pay | Admitting: Nurse Practitioner

## 2020-03-23 ENCOUNTER — Other Ambulatory Visit: Payer: Self-pay

## 2020-03-23 VITALS — BP 120/68 | HR 69 | Ht 71.0 in | Wt 257.0 lb

## 2020-03-23 DIAGNOSIS — G4733 Obstructive sleep apnea (adult) (pediatric): Secondary | ICD-10-CM

## 2020-03-23 DIAGNOSIS — E6609 Other obesity due to excess calories: Secondary | ICD-10-CM | POA: Diagnosis not present

## 2020-03-23 DIAGNOSIS — R079 Chest pain, unspecified: Secondary | ICD-10-CM | POA: Diagnosis not present

## 2020-03-23 DIAGNOSIS — E782 Mixed hyperlipidemia: Secondary | ICD-10-CM

## 2020-03-23 DIAGNOSIS — E66812 Obesity, class 2: Secondary | ICD-10-CM

## 2020-03-23 DIAGNOSIS — Z6835 Body mass index (BMI) 35.0-35.9, adult: Secondary | ICD-10-CM

## 2020-03-23 DIAGNOSIS — Z9989 Dependence on other enabling machines and devices: Secondary | ICD-10-CM | POA: Diagnosis not present

## 2020-03-23 DIAGNOSIS — R9431 Abnormal electrocardiogram [ECG] [EKG]: Secondary | ICD-10-CM

## 2020-03-23 NOTE — Patient Instructions (Addendum)
Medication Instructions:  Your physician recommends that you continue on your current medications as directed. Please refer to the Current Medication list given to you today.  *If you need a refill on your cardiac medications before your next appointment, please call your pharmacy*   Lab Work: None Ordered If you have labs (blood work) drawn today and your tests are completely normal, you will receive your results only by: Marland Kitchen MyChart Message (if you have MyChart) OR . A paper copy in the mail If you have any lab test that is abnormal or we need to change your treatment, we will call you to review the results.   Testing/Procedures: Your physician has requested that you have a 2 day lexiscan myoview. For further information please visit [email or web address] Please follow instruction sheet, as given.  Your physician has requested that you have an echocardiogram. Echocardiography is a painless test that uses sound waves to create images of your heart. It provides your doctor with information about the size and shape of your heart and how well your heart's chambers and valves are working. This procedure takes approximately one hour. There are no restrictions for this procedure.     Follow-Up: At [facility], you and your health needs are our priority.  As part of our continuing mission to provide you with exceptional heart care, we have created designated Provider Care Teams.  These Care Teams include your primary Cardiologist (physician) and Advanced Practice Providers (APPs -  Physician Assistants and Nurse Practitioners) who all work together to provide you with the care you need, when you need it.   Your next appointment:   3 month(s)  The format for your next appointment:   Either In Person or Virtual  Provider:   You may see Kristeen Miss, MD or one of the following Advanced Practice Providers on your designated Care Team:    Tereso Newcomer, PA-C  [place]in [place], [place]  [place], [place]

## 2020-03-23 NOTE — Progress Notes (Signed)
Cardiology Office Note:    Date:  03/23/2020   ID:  MONTARIUS KITAGAWA, DOB [birth year], MRN [id number]  PCP:  Unk Pinto, MD  Cardiologist:  Clydell Sposito  Electrophysiologist:  None   Referring MD: Unk Pinto, MD   Chief Complaint  Patient presents with  . Chest Pain    History of Present Illness:    Shane Saunders is a 49 y.o. male with a hx of chest chest pain or shortness of breath.  He was seen by Pecolia Ades, NP 2 years ago.  I have never met him before. Creatinine has a history of obstructive sleep apnea and uses CPAP.  He has a vitamin D deficiency, hyperlipidemia.  He had another episode of chest pain last week and was evaluated in the emergency room.  He is here today for further follow-up.  Was seen in 2019 for orthostatic hypotension   orthostatic hypotension He reported multiple episodes of orthostatic hypotension and also dizziness while walking.  An event monitor was ordered in April, 2019 revealed only sinus rhythm including sinus bradycardia and sinus tachycardia.  No serious arrhythmias were seen.  frequent episodes of orthostasis   ,  Just last for a second or so . Does not last long enough for him to fall Has issues with balance  Has had issues with motor skills , neuro changes. Has discussed with his primary MD  Went to the ER last week with dyspnea, chest pressure,  Arm tingling, leg tingling. Feels cold chills.  No fever tsh is normal   Has not exercised in several months  Gets headaches with exercise    Becomes fatigues, Worked previously as a Building control surveyor, no longer working .    Has never had syncope   Lipids in Nov, 2020 show Trig level of 406 Has lost 20 + lbs over the past several months    Has some DOE ,  Is very deconditioned.  Admits that he does not sleep well   Past Medical History:  Diagnosis Date  . Abnormal glucose   . Borderline hypertension   . BPH (benign prostatic hypertrophy)   . Crushing injury of left elbow and  forearm 07/13/2015  . Depression, major, in partial remission ([place]) 07/12/2015  . Dizziness   . Hyperlipidemia   . Hypertension   . Hypogonadism male   . 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 Morbid obesity ([place])   . Obesity (BMI 30-39.9)   . OSA on CPAP   . Prediabetes   . Right ureteral stone   . Sleep apnea    C PAP  . Vertigo   . Vitamin D deficiency   . Wears glasses     Past Surgical History:  Procedure Laterality Date  . 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 ARTERY REPAIR Right 05/31/2014   Procedure: IRRIGATION, EXPLORATION, AND REPAIR OF RIGHT ARM WOUND;  Surgeon: Gwenyth Ober, MD;  Location: [place];  Service: General;  Laterality: Right;  . CYSTOSCOPY WITH RETROGRADE PYELOGRAM, URETEROSCOPY AND STENT PLACEMENT Right 07/11/2016   Procedure: CYSTOSCOPY WITH RETROGRADE PYELOGRAM, URETEROSCOPY, BASKET STONE EXTRACTION  AND STENT PLACEMENT;  Surgeon: Alexis Frock, MD;  Location: [facility];  Service: Urology;  Laterality: Right;  45 MINS  C-ARM DIGITAL URETEROSCOPE HOLMIUM LASER  . ESOPHAGOGASTRODUODENOSCOPY      Current Medications: Current Meds  Medication Sig  .  ALPRAZolam (XANAX) 1 MG tablet Take 1/2 - 1 tablet 2 - 3 x /day ONLY if needed for Anxiety Attack &  limit to 5 days /week to avoid Addiction & Dementia  . atorvastatin (LIPITOR) 80 MG tablet TAKE 1 TABLET BY MOUTH EVERY DAY  . buPROPion (WELLBUTRIN XL) 150 MG 24 hr tablet Take 1 tablet every Morning for Mood, Focus & Concentration  . cetirizine-pseudoephedrine (ZYRTEC-D) 5-120 MG tablet Takes 1 tablet 2 x/day as needed - congestion  . Cholecalciferol (VITAMIN D3) 2000 units TABS Take 10,000 tablets by mouth daily.   . Cyanocobalamin (VITAMIN B 12 PO) Take 1 tablet by mouth daily.  . cyclobenzaprine (FLEXERIL) 10 MG tablet Take 1/2 to 1 tablet 3 x /day as needed for Muscle Spasm or headache   . dexamethasone (DECADRON) 2 MG tablet Take 1 tab 3 x day - 3 days, then 2 x day - 3 days, then 1 tab daily  . doxycycline (VIBRAMYCIN) 100 MG capsule Take 1 capsule 2 x /day with meals for Infection  . escitalopram (LEXAPRO) 20 MG tablet Take 1 tablet Daily for Mood  . fexofenadine (ALLEGRA) 60 MG tablet Takes 1 tablet 2 x /day as needed for Allergies  . gabapentin (NEURONTIN) 600 MG tablet Take 1/2 to 1 tablet 1 hour before Bedtime for Sleep  . silodosin (RAPAFLO) 8 MG CAPS capsule Take 1 capsule at Bedtime for Prostate     Allergies:   Codeine, Effexor [venlafaxine], Prednisone, Tramadol hcl, and Zinc   Social History   Socioeconomic History  . Marital status: Married    Spouse name: Leafy Ro  . Number of children: 2  . Years of education: 52  . Highest education level: Not on file  Occupational History    Comment: na  Tobacco Use  . Smoking status: Former Smoker    Packs/day: 0.50    Years: 10.00    Pack years: 5.00    Types: Cigarettes    Quit date: 10/27/2009    Years since quitting: 10.4  . Smokeless tobacco: Former Systems developer    Types: Snuff  Substance and Sexual Activity  . Alcohol use: Yes    Comment: occasional  . Drug use: No  . Sexual activity: Yes    Comment: Vasectomy  Other Topics Concern  . Not on file  Social History Narrative   Lives with wife   Caffeine -tea, 1 daily       Social Determinants of Health   Financial Resource Strain:   . Difficulty of Paying Living Expenses:   Food Insecurity:   . Worried About Charity fundraiser in the Last Year:   . Arboriculturist in the Last Year:   Transportation Needs:   . Film/video editor (Medical):   Marland Kitchen Lack of Transportation (Non-Medical):   Physical Activity:   . Days of Exercise per Week:   . Minutes of Exercise per Session:   Stress:   . Feeling of Stress :   Social Connections:   . Frequency of Communication with Friends and Family:   . Frequency of Social Gatherings with Friends and Family:    . Attends Religious Services:   . Active Member of Clubs or Organizations:   . Attends Archivist Meetings:   Marland Kitchen Marital Status:      Family History: The patient's family history includes CAD in his paternal grandfather; Cancer in his sister; Diabetes in his father; Hyperlipidemia in his father; Hypertension in his father and mother; Migraines  in his mother; Thyroid disease in his mother.  ROS:   Please see the history of present illness.     All other systems reviewed and are negative.  EKGs/Labs/Other Studies Reviewed:    The following studies were reviewed today:   EKG:   Normal sinus rhythm at 69.  He has T wave inversions in inferolateral leads which is new   Recent Labs: 11/01/2019: Magnesium 2.3; TSH 1.78 03/14/2020: ALT 42; BUN 13; Creatinine 1.32; Hemoglobin 15.8; Platelet Count 187; Potassium 4.2; Sodium 141  Recent Lipid Panel    Component Value Date/Time   CHOL 144 11/01/2019 1623   TRIG 406 (H) 11/01/2019 1623   HDL 37 (L) 11/01/2019 1623   CHOLHDL 3.9 11/01/2019 1623   VLDL 34 (H) 05/27/2017 1622   LDLCALC  11/01/2019 1623     Comment:     . LDL cholesterol not calculated. Triglyceride levels greater than 400 mg/dL invalidate calculated LDL results. . Reference range: <100 . Desirable range <100 mg/dL for primary prevention;   <70 mg/dL for patients with CHD or diabetic patients  with > or = 2 CHD risk factors. Marland Kitchen LDL-C is now calculated using the Martin-Hopkins  calculation, which is a validated novel method providing  better accuracy than the Friedewald equation in the  estimation of LDL-C.  Cresenciano Genre et al. Annamaria Helling. 0737;106(26): 2061-2068  (http://education.[facility].com/faq/FAQ164)     Physical Exam:    VS:  BP 120/68   Pulse 69   Ht '5\' 11"'$  (1.803 m)   Wt 257 lb (116.6 kg)   SpO2 96%   BMI 35.84 kg/m     Wt Readings from Last 3 Encounters:  03/23/20 257 lb (116.6 kg)  03/21/20 255 lb 12.8 oz (116 kg)  03/14/20 258 lb 11.2  oz (117.3 kg)     GEN:  Middle age male,  Moderately obese  HEENT: Normal NECK: No JVD; No carotid bruits LYMPHATICS: No lymphadenopathy CARDIAC: RRR, no murmurs, rubs, gallops RESPIRATORY:  Clear to auscultation without rales, wheezing or rhonchi  ABDOMEN: Soft, non-tender, non-distended MUSCULOSKELETAL:  No edema; No deformity  SKIN: Warm and dry NEUROLOGIC:  Alert and oriented x 3 PSYCHIATRIC:  Normal affect   ASSESSMENT:    1. Mixed hyperlipidemia   2. OSA on CPAP   3. Class 2 obesity due to excess calories without serious comorbidity with body mass index (BMI) of 35.0 to 35.9 in adult   4. Chest pain of uncertain etiology   5. Nonspecific abnormal electrocardiogram (ECG) (EKG)    PLAN:    In order of problems listed above:  1. Chest pain : Ulises  presents with very unusual symptoms but some but he does have some concerning aspects.  He complains of chest discomfort and shortness of breath with exertion.  This all might be due to his obesity but I cannot exclude coronary artery disease.  He has T wave inversions in the inferolateral leads which again could be due to LVH with repolarization but could be due to ischemia.  Ronalee Belts would like to get a 2-day [place] Myoview study.  We will also get an echocardiogram. He wore a monitor last year and did not have any arrhythmias.  I will see him in 3 months for a follow-up visit.   Medication Adjustments/Labs and Tests Ordered: Current medicines are reviewed at length with the patient today.  Concerns regarding medicines are outlined above.  Orders Placed This Encounter  Procedures  . MYOCARDIAL PERFUSION IMAGING  . EKG 12-Lead  .  ECHOCARDIOGRAM COMPLETE   No orders of the defined types were placed in this encounter.    Patient Instructions  Medication Instructions:  Your physician recommends that you continue on your current medications as directed. Please refer to the Current Medication list given to you today.  *If  you need a refill on your cardiac medications before your next appointment, please call your pharmacy*   Lab Work: None Ordered If you have labs (blood work) drawn today and your tests are completely normal, you will receive your results only by: Marland Kitchen MyChart Message (if you have MyChart) OR . A paper copy in the mail If you have any lab test that is abnormal or we need to change your treatment, we will call you to review the results.   Testing/Procedures: Your physician has requested that you have a 2 day lexiscan myoview. For further information please visit [email or web address]. Please follow instruction sheet, as given.  Your physician has requested that you have an echocardiogram. Echocardiography is a painless test that uses sound waves to create images of your heart. It provides your doctor with information about the size and shape of your heart and how well your heart's chambers and valves are working. This procedure takes approximately one hour. There are no restrictions for this procedure.     Follow-Up: At [facility], you and your health needs are our priority.  As part of our continuing mission to provide you with exceptional heart care, we have created designated Provider Care Teams.  These Care Teams include your primary Cardiologist (physician) and Advanced Practice Providers (APPs -  Physician Assistants and Nurse Practitioners) who all work together to provide you with the care you need, when you need it.   Your next appointment:   3 month(s)  The format for your next appointment:   Either In Person or Virtual  Provider:   You may see Mertie Moores, MD or one of the following Advanced Practice Providers on your designated Care Team:    Richardson Dopp, PA-C  Vin [place], [place]  Daune Perch, [place]        Signed, Mertie Moores, MD  03/23/2020 11:49 AM    [place]

## 2020-03-24 ENCOUNTER — Other Ambulatory Visit: Payer: Self-pay

## 2020-03-24 ENCOUNTER — Emergency Department (HOSPITAL_COMMUNITY): Payer: BC Managed Care – PPO

## 2020-03-24 ENCOUNTER — Emergency Department (HOSPITAL_COMMUNITY)
Admission: EM | Admit: 2020-03-24 | Discharge: 2020-03-25 | Disposition: A | Discharge status: Home / Self Care | Payer: BC Managed Care – PPO | Attending: Emergency Medicine | Admitting: Emergency Medicine

## 2020-03-24 ENCOUNTER — Encounter (HOSPITAL_COMMUNITY): Payer: Self-pay | Admitting: Emergency Medicine

## 2020-03-24 DIAGNOSIS — R202 Paresthesia of skin: Secondary | ICD-10-CM | POA: Diagnosis not present

## 2020-03-24 DIAGNOSIS — Z87891 Personal history of nicotine dependence: Secondary | ICD-10-CM | POA: Insufficient documentation

## 2020-03-24 DIAGNOSIS — R519 Headache, unspecified: Secondary | ICD-10-CM

## 2020-03-24 DIAGNOSIS — Z79899 Other long term (current) drug therapy: Secondary | ICD-10-CM | POA: Insufficient documentation

## 2020-03-24 DIAGNOSIS — I10 Essential (primary) hypertension: Secondary | ICD-10-CM | POA: Diagnosis not present

## 2020-03-24 DIAGNOSIS — R531 Weakness: Secondary | ICD-10-CM | POA: Insufficient documentation

## 2020-03-24 LAB — CBC
HCT: 45.6 % (ref 39.0–52.0)
HCT: 33.6 % — ABNORMAL LOW (ref 39.0–52.0)
Hemoglobin: 16.3 g/dL (ref 13.0–17.0)
Hemoglobin: 12 g/dL — ABNORMAL LOW (ref 13.0–17.0)
MCH: 32.9 pg (ref 26.0–34.0)
MCH: 32.4 pg (ref 26.0–34.0)
MCHC: 35.7 g/dL (ref 30.0–36.0)
MCHC: 35.7 g/dL (ref 30.0–36.0)
MCV: 91.9 fL (ref 80.0–100.0)
MCV: 90.8 fL (ref 80.0–100.0)
Platelets: UNDETERMINED 10*3/uL (ref 150–400)
Platelets: UNDETERMINED 10*3/uL (ref 150–400)
RBC: 4.96 MIL/uL (ref 4.22–5.81)
RBC: 3.7 MIL/uL — ABNORMAL LOW (ref 4.22–5.81)
RDW: 12.2 % (ref 11.5–15.5)
RDW: 12.1 % (ref 11.5–15.5)
WBC: 10.4 10*3/uL (ref 4.0–10.5)
WBC: 7.5 10*3/uL (ref 4.0–10.5)
nRBC: 0 % (ref 0.0–0.2)
nRBC: 0 % (ref 0.0–0.2)

## 2020-03-24 LAB — BASIC METABOLIC PANEL
Anion gap: 11 (ref 5–15)
BUN: 18 mg/dL (ref 6–20)
CO2: 24 mmol/L (ref 22–32)
Calcium: 9.5 mg/dL (ref 8.9–10.3)
Chloride: 105 mmol/L (ref 98–111)
Creatinine, Ser: 1.23 mg/dL (ref 0.61–1.24)
GFR calc Af Amer: >60 mL/min (ref >60)
GFR calc non Af Amer: >60 mL/min (ref >60)
Glucose, Bld: 133 mg/dL — ABNORMAL HIGH (ref 70–99)
Potassium: 4.2 mmol/L (ref 3.5–5.1)
Sodium: 140 mmol/L (ref 135–145)

## 2020-03-24 LAB — TROPONIN I (HIGH SENSITIVITY)
Troponin I (High Sensitivity): 3 ng/L (ref <18)
Troponin I (High Sensitivity): 4 ng/L (ref <18)

## 2020-03-24 MED ORDER — SODIUM CHLORIDE 0.9% FLUSH
3.0000 mL | Freq: Once | INTRAVENOUS | Status: AC
  Administered 2020-03-24: 3 mL via INTRAVENOUS

## 2020-03-24 MED ORDER — METOCLOPRAMIDE HCL 5 MG/ML IJ SOLN
10.0000 mg | Freq: Once | INTRAMUSCULAR | Status: AC
  Administered 2020-03-24: 10 mg via INTRAVENOUS
  Filled 2020-03-24: qty 2

## 2020-03-24 MED ORDER — KETOROLAC TROMETHAMINE 30 MG/ML IJ SOLN
15.0000 mg | Freq: Once | INTRAMUSCULAR | Status: AC
  Administered 2020-03-24: 15 mg via INTRAVENOUS
  Filled 2020-03-24: qty 1

## 2020-03-24 MED ORDER — IOHEXOL 350 MG/ML SOLN
75.0000 mL | Freq: Once | INTRAVENOUS | Status: AC | PRN
  Administered 2020-03-24: 75 mL via INTRAVENOUS

## 2020-03-24 MED ORDER — LORAZEPAM 2 MG/ML IJ SOLN
1.0000 mg | Freq: Once | INTRAMUSCULAR | Status: AC
  Administered 2020-03-24: 1 mg via INTRAVENOUS
  Filled 2020-03-24: qty 1

## 2020-03-24 NOTE — ED Provider Notes (Addendum)
[facility] EMERGENCY DEPARTMENT Provider Note   CSN: [id number] Arrival date & time: 03/24/20  1711     History Chief Complaint  Patient presents with  . multiple complaints  . Anxiety  . Headache    Shane Saunders is a 49 y.o. male.  HPI     49 year old with history of BPH, prediabetes, dizziness, hypertension, hyperlipidemia comes in a chief complaint of headaches. Patient reports that he has been having headache for the last week.  The headache is generalized and starts in his neck and moves upwards.  The headache has been constant and has not responded to Tylenol and ibuprofen.  He has no specific evoking, aggravating or relieving factors.  He does think that the headaches are worse during the daytime compared to night.  He does not know if the headaches are positional.  He describes the headache as pressure-like feeling and today the symptoms were worse than before, therefore he decided to come to the ER.   Review of system is positive for some right-sided visual disturbance, bilateral upper hand and feet paresthesia, generalized weakness.  Patient denies any new dizziness or lightheadedness.  He denies any trauma.  There is no family history of brain aneurysm, brain bleed, brain tumor.  Past Medical History:  Diagnosis Date  . Abnormal glucose   . Borderline hypertension   . BPH (benign prostatic hypertrophy)   . Crushing injury of left elbow and forearm 07/13/2015  . 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 Dizziness   . Hyperlipidemia   . Hypertension   . Hypogonadism male   . 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 Morbid obesity (HCC)   . Obesity (BMI 30-39.9)   . OSA on CPAP   . Prediabetes   . Right ureteral stone   . Sleep apnea    C PAP  . Vertigo   . Vitamin D deficiency   . Wears glasses     Patient Active Problem List   Diagnosis Date Noted  . Obstructive sleep apnea hypopnea, severe  03/16/2020  . 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  . Excessive daytime sleepiness 02/13/2020  . Chronic fatigue 02/13/2020  . BPH with obstruction/lower urinary tract symptoms   . Prediabetes 10/11/2018  . Former smoker 10/11/2018  . Family history of ischemic heart disease 10/11/2018  . FH: hypertension 03/23/2018  . Crushing injury of left elbow and forearm 07/13/2015  . 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 Medication management 02/08/2014  . Essential hypertension   . Hyperlipidemia, mixed   . OSA on CPAP   . Abnormal glucose   . Morbid obesity (HCC)   . Vitamin D deficiency     Past Surgical History:  Procedure Laterality Date  . 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 ARTERY REPAIR Right 05/31/2014   Procedure: IRRIGATION, EXPLORATION, AND REPAIR OF RIGHT ARM WOUND;  Surgeon: Cherylynn Ridges, MD;  Location: [facility] OR;  Service: General;  Laterality: Right;  . CYSTOSCOPY WITH RETROGRADE PYELOGRAM, URETEROSCOPY AND STENT PLACEMENT Right 07/11/2016   Procedure: CYSTOSCOPY WITH RETROGRADE PYELOGRAM, URETEROSCOPY, BASKET STONE EXTRACTION  AND STENT PLACEMENT;  Surgeon: Sebastian Ache, MD;  Location: [facility];  Service: Urology;  Laterality: Right;  45 MINS  C-ARM DIGITAL URETEROSCOPE HOLMIUM LASER  . ESOPHAGOGASTRODUODENOSCOPY         Family History  Problem  Relation Age of Onset  . Hypertension Mother   . Migraines Mother   . Thyroid disease Mother   . Hypertension Father   . Hyperlipidemia Father   . Diabetes Father   . Cancer Sister        thyroid  . CAD Paternal Grandfather     Social History   Tobacco Use  . Smoking status: Former Smoker    Packs/day: 0.50    Years: 10.00    Pack years: 5.00    Types: Cigarettes    Quit date: 10/27/2009    Years since quitting: 10.4  . Smokeless tobacco: Former Neurosurgeon    Types: Snuff  Substance Use Topics  .  Alcohol use: Yes    Comment: occasional  . Drug use: No    Home Medications Prior to Admission medications   Medication Sig Start Date End Date Taking? Authorizing Provider  buPROPion (WELLBUTRIN XL) 150 MG 24 hr tablet Take 1 tablet every Morning for Mood, Focus & Concentration Patient taking differently: Take 150 mg by mouth in the morning.  03/14/20  Yes [name], MD  Cholecalciferol (VITAMIN D3) 250 MCG (10000 UT) TABS Take 10,000 Units by mouth daily.   Yes [provider]  Cyanocobalamin (VITAMIN B 12 PO) Take 1 tablet by mouth daily.   Yes [provider]  cyclobenzaprine (FLEXERIL) 10 MG tablet Take 1/2 to 1 tablet 3 x /day as needed for Muscle Spasm or headache Patient taking differently: Take 5-10 mg by mouth 3 (three) times daily as needed for muscle spasms (or headaches).  03/21/20  Yes [name], MD  dexamethasone (DECADRON) 2 MG tablet Take 1 tab 3 x day - 3 days, then 2 x day - 3 days, then 1 tab daily Patient taking differently: Take 2 mg by mouth See admin instructions. Take 2 mg by mouth three times a day for 3 days, 2 mg two times a day for 3 days, then 2 mg once a day as directed 03/14/20  Yes [name], MD  doxycycline (VIBRAMYCIN) 100 MG capsule Take 1 capsule 2 x /day with meals for Infection Patient taking differently: Take 100 mg by mouth 2 (two) times daily.  03/21/20  Yes [name], MD  escitalopram (LEXAPRO) 20 MG tablet Take 1 tablet Daily for Mood Patient taking differently: Take 20 mg by mouth daily.  03/14/20  Yes [name], MD  fexofenadine (ALLEGRA) 60 MG tablet Takes 1 tablet 2 x /day as needed for Allergies Patient taking differently: Take 60 mg by mouth 2 (two) times daily as needed (for seasonal allergies).  03/21/20  Yes [name], MD  fluticasone [facility]) 50 MCG/ACT nasal spray Place 1-2 sprays into both nostrils daily as needed for allergies or rhinitis.   Yes [provider]  gabapentin  (NEURONTIN) 600 MG tablet Take 1/2 to 1 tablet 1 hour before Bedtime for Sleep Patient taking differently: Take 300-600 mg by mouth at bedtime.  03/21/20  Yes [name], MD  silodosin (RAPAFLO) 8 MG CAPS capsule Take 1 capsule at Bedtime for Prostate Patient taking differently: Take 8 mg by mouth daily with breakfast.  11/01/19  Yes [name], MD  ALPRAZolam Prudy Feeler) 1 MG tablet Take 1/2 - 1 tablet 2 - 3 x /day ONLY if needed for Anxiety Attack &  limit to 5 days /week to avoid Addiction & Dementia Patient taking differently: Take 0.5-1 mg by mouth See admin instructions. Take 0.5-1 mg by mouth two to three times a day  as needed for anxiety attacks- limit to 5 days a week to avoid addiction and dementia 03/14/20   [name], MD  atorvastatin (LIPITOR) 80 MG tablet TAKE 1 TABLET BY MOUTH EVERY DAY Patient not taking: No sig reported 03/16/19   [name], MD  cetirizine-pseudoephedrine (ZYRTEC-D) 5-120 MG tablet Takes 1 tablet 2 x/day as needed - congestion Patient not taking: Reported on 03/24/2020 03/21/20   [name], MD    Allergies    Codeine, Effexor [venlafaxine], Prednisone, Tramadol hcl, and Zinc  Review of Systems   Review of Systems  Constitutional: Positive for activity change.  Respiratory: Negative for shortness of breath.   Cardiovascular: Negative for chest pain.  Gastrointestinal: Negative for nausea and vomiting.  Allergic/Immunologic: Negative for immunocompromised state.  Neurological: Positive for dizziness, numbness and headaches.  Hematological: Does not bruise/bleed easily.  All other systems reviewed and are negative.   Physical Exam Updated Vital Signs BP (!) 156/98   Pulse 65   Temp 98.3 F (36.8 C) (Oral)   Resp 16   SpO2 94%   Physical Exam Vitals reviewed.  Constitutional:      Appearance: He is well-developed.  HENT:     Head: Normocephalic and atraumatic.  Eyes:     General: No visual field deficit.    Pupils:  Pupils are equal, round, and reactive to light.  Neck:     Vascular: No JVD.  Cardiovascular:     Rate and Rhythm: Normal rate and regular rhythm.  Pulmonary:     Effort: Pulmonary effort is normal. No respiratory distress.     Breath sounds: Normal breath sounds. No wheezing.  Abdominal:     General: Bowel sounds are normal. There is no distension.     Palpations: Abdomen is soft.     Tenderness: There is no abdominal tenderness. There is no guarding or rebound.  Musculoskeletal:     Cervical back: Normal range of motion and neck supple. No rigidity.  Skin:    General: Skin is warm and dry.  Neurological:     Mental Status: He is alert and oriented to person, place, and time.     GCS: GCS eye subscore is 4. GCS verbal subscore is 5. GCS motor subscore is 6.     Cranial Nerves: No cranial nerve deficit, dysarthria or facial asymmetry.     Sensory: No sensory deficit.     Motor: No weakness.     Coordination: Coordination normal.     Comments: NIHSS - 0 No objective sensory deficits, Motor strength upper and lower extremity 4+ and equal Normal cerebellar exam     ED Results / Procedures / Treatments   Labs (all labs ordered are listed, but only abnormal results are displayed) Labs Reviewed  BASIC METABOLIC PANEL - Abnormal; Notable for the following components:      Result Value   Glucose, Bld 133 (*)    All other components within normal limits  CBC - Abnormal; Notable for the following components:   RBC 3.70 (*)    Hemoglobin 12.0 (*)    HCT 33.6 (*)    All other components within normal limits  CBC  TROPONIN I (HIGH SENSITIVITY)  TROPONIN I (HIGH SENSITIVITY)    EKG EKG Interpretation  Date/Time:  Saturday March 24 2020 17:43:44 EDT Ventricular Rate:  84 PR Interval:  144 QRS Duration: 88 QT Interval:  362 QTC Calculation: 427 R Axis:   70 Text Interpretation: Normal sinus rhythm Cannot rule out Inferior  infarct , age undetermined Abnormal ECG No acute  changes No significant change since last tracing Confirmed by Varney Biles [phone]) on 03/24/2020 9:40:22 PM   Radiology CT Angio Head W or Wo Contrast  Result Date: 03/24/2020 CLINICAL DATA:  Headache, sore throat, tenderness to the top of the head, neck spasms, and back pain. EXAM: CT ANGIOGRAPHY HEAD AND NECK TECHNIQUE: Multidetector CT imaging of the head and neck was performed using the standard protocol during bolus administration of intravenous contrast. Multiplanar CT image reconstructions and MIPs were obtained to evaluate the vascular anatomy. Carotid stenosis measurements (when applicable) are obtained utilizing NASCET criteria, using the distal internal carotid diameter as the denominator. CONTRAST:  61mL OMNIPAQUE IOHEXOL 350 MG/ML SOLN COMPARISON:  Head MRI 11/17/2019 FINDINGS: CT HEAD FINDINGS Brain: There is no evidence of acute infarct, intracranial hemorrhage, mass, midline shift, or extra-axial fluid collection. The lateral ventricles are borderline prominent in size without temporal horn dilatation, unchanged. The third and fourth ventricles are normal in size. Vascular: No hyperdense vessel. Skull: No fracture or suspicious osseous lesion. Sinuses: Paranasal sinuses and mastoid air cells are clear. Orbits: Unremarkable. Review of the MIP images confirms the above findings CTA NECK FINDINGS Aortic arch: Normal variant aortic arch branching pattern with common origin of the brachiocephalic and left common carotid arteries. Widely patent arch vessel origins. Right carotid system: Patent with small volume calcified plaque in the carotid bulb. No evidence of stenosis or dissection. Left carotid system: Patent without evidence of stenosis or dissection. Vertebral arteries: Patent without evidence of stenosis or dissection. Mildly dominant left vertebral artery. Skeleton: No acute osseous abnormality or suspicious osseous lesion. Other neck: No evidence of cervical lymphadenopathy or mass. Upper  chest: Clear lung apices. Review of the MIP images confirms the above findings CTA HEAD FINDINGS Anterior circulation: The internal carotid arteries are widely patent from skull base to carotid termini. ACAs and MCAs are patent without evidence of proximal branch occlusion or significant proximal stenosis. No aneurysm is identified. Posterior circulation: The intracranial vertebral arteries are widely patent to the basilar. Patent PICAs and SCAs are seen bilaterally. The basilar artery is widely patent. There are left larger than right posterior communicating arteries with hypoplasia of the left P1 segment. No significant proximal PCA stenosis is seen. No aneurysm is identified. Venous sinuses: Patent. Anatomic variants: Fetal left PCA. Review of the MIP images confirms the above findings IMPRESSION: Negative head and neck CTA. Electronically Signed   By: Logan Bores M.D.   On: 03/24/2020 22:27   CT Angio Neck W and/or Wo Contrast  Result Date: 03/24/2020 CLINICAL DATA:  Headache, sore throat, tenderness to the top of the head, neck spasms, and back pain. EXAM: CT ANGIOGRAPHY HEAD AND NECK TECHNIQUE: Multidetector CT imaging of the head and neck was performed using the standard protocol during bolus administration of intravenous contrast. Multiplanar CT image reconstructions and MIPs were obtained to evaluate the vascular anatomy. Carotid stenosis measurements (when applicable) are obtained utilizing NASCET criteria, using the distal internal carotid diameter as the denominator. CONTRAST:  29mL OMNIPAQUE IOHEXOL 350 MG/ML SOLN COMPARISON:  Head MRI 11/17/2019 FINDINGS: CT HEAD FINDINGS Brain: There is no evidence of acute infarct, intracranial hemorrhage, mass, midline shift, or extra-axial fluid collection. The lateral ventricles are borderline prominent in size without temporal horn dilatation, unchanged. The third and fourth ventricles are normal in size. Vascular: No hyperdense vessel. Skull: No fracture or  suspicious osseous lesion. Sinuses: Paranasal sinuses and mastoid air cells are clear. Orbits:  Unremarkable. Review of the MIP images confirms the above findings CTA NECK FINDINGS Aortic arch: Normal variant aortic arch branching pattern with common origin of the brachiocephalic and left common carotid arteries. Widely patent arch vessel origins. Right carotid system: Patent with small volume calcified plaque in the carotid bulb. No evidence of stenosis or dissection. Left carotid system: Patent without evidence of stenosis or dissection. Vertebral arteries: Patent without evidence of stenosis or dissection. Mildly dominant left vertebral artery. Skeleton: No acute osseous abnormality or suspicious osseous lesion. Other neck: No evidence of cervical lymphadenopathy or mass. Upper chest: Clear lung apices. Review of the MIP images confirms the above findings CTA HEAD FINDINGS Anterior circulation: The internal carotid arteries are widely patent from skull base to carotid termini. ACAs and MCAs are patent without evidence of proximal branch occlusion or significant proximal stenosis. No aneurysm is identified. Posterior circulation: The intracranial vertebral arteries are widely patent to the basilar. Patent PICAs and SCAs are seen bilaterally. The basilar artery is widely patent. There are left larger than right posterior communicating arteries with hypoplasia of the left P1 segment. No significant proximal PCA stenosis is seen. No aneurysm is identified. Venous sinuses: Patent. Anatomic variants: Fetal left PCA. Review of the MIP images confirms the above findings IMPRESSION: Negative head and neck CTA. Electronically Signed   By: Sebastian Ache M.D.   On: 03/24/2020 22:27    Procedures Procedures (including critical care time)  Medications Ordered in ED Medications  ketorolac (TORADOL) 30 MG/ML injection 15 mg (has no administration in time range)  metoCLOPramide (REGLAN) injection 10 mg (has no  administration in time range)  sodium chloride flush (NS) 0.9 % injection 3 mL (3 mLs Intravenous Given 03/24/20 2310)  metoCLOPramide (REGLAN) injection 10 mg (10 mg Intravenous Given 03/24/20 2128)  iohexol (OMNIPAQUE) 350 MG/ML injection 75 mL (75 mLs Intravenous Contrast Given 03/24/20 2213)  ketorolac (TORADOL) 30 MG/ML injection 15 mg (15 mg Intravenous Given 03/24/20 2307)  LORazepam (ATIVAN) injection 1 mg (1 mg Intravenous Given 03/24/20 2307)    ED Course  I have reviewed the triage vital signs and the nursing notes.  Pertinent labs & imaging results that were available during my care of the patient were reviewed by me and considered in my medical decision making (see chart for details).  Clinical Course as of Mar 25 28  Sat Mar 24, 2020  2231 We will repeat CBC to get the platelet count.  Platelets: PLATELET CLUMPS NOTED ON SMEAR, UNABLE TO ESTIMATE [AN]  2231 CT angiogram does not reveal any dissection, aneurysm.  CT Angio Neck W and/or Wo Contrast [AN]  Sun Mar 25, 2020  0027 CT angios reassuring.  Patient reassessed.  He informs that after the second round of medicine his headache has improved and it is now 2 out of 10.  He continues to have no focal neurologic deficits.  He is stable for discharge. He will follow-up with neurology.  CT Angio Neck W and/or Wo Contrast [AN]  0028 Patient reports that his platelet count always is abnormal on regular CBC and they have to send his CBC in a special solution to get normal results.  Given that information I do not think there is TTP/ITP.   Platelets: PLATELET CLUMPS NOTED ON SMEAR, UNABLE TO ESTIMATE [AN]    Clinical Course User Index [AN] Derwood Kaplan, MD   MDM Rules/Calculators/A&P  49 year old comes in a chief complaint o42f headaches. He is having generalized headaches that starts at the neck and moves up.  Patient has some visual deficits, bilateral upper extremity numbness in his hands.  He has no  focal neuro deficits on exam.  Differential diagnosis includes dissection, aneurysm, tumor.  Subarachnoid hemorrhage less likely given that the headache at its peak is 8 out of 10.  Patient does not smoke or use any drugs and there is no family history of brain bleeds, and the headache does not appear to be thunderclap in nature.  No risk factors for subarachnoid hemorrhage.  Plan is to get CT angiogram head and neck.  We will reassess post imaging.  Final Clinical Impression(s) / ED Diagnoses Final diagnoses:  Bad headache    Rx / DC Orders ED Discharge Orders    None       Derwood KaplanNanavati, Sommer Spickard, MD 03/24/20 2141    Derwood KaplanNanavati, Mallery Harshman, MD 03/25/20 [phone]

## 2020-03-24 NOTE — ED Triage Notes (Signed)
Pt with multiple complaints.  Reports headache x 6 days, sore throat x 3 days, tenderness to top of head, neck spasms, and back pain.  Reports SOB and epigastric pain x 2 weeks.  States he was seen at [facility] for same and thinks it is related to anxiety.

## 2020-03-25 MED ORDER — METOCLOPRAMIDE HCL 5 MG/ML IJ SOLN
10.0000 mg | Freq: Once | INTRAMUSCULAR | Status: DC
  Filled 2020-03-25: qty 2

## 2020-03-25 MED ORDER — KETOROLAC TROMETHAMINE 30 MG/ML IJ SOLN
15.0000 mg | Freq: Once | INTRAMUSCULAR | Status: AC
  Administered 2020-03-25: 15 mg via INTRAVENOUS
  Filled 2020-03-25: qty 1

## 2020-03-25 NOTE — ED Notes (Signed)
Patient verbalizes understanding of discharge instructions. Opportunity for questioning and answers were provided. Armband removed by staff, pt discharged from ED ambulatory.   

## 2020-03-25 NOTE — Discharge Instructions (Addendum)
We saw you in the ER for headaches. All the labs and imaging are normal. We are not sure what is causing your headaches, however, there appears to be no evidence of infection, bleeds or tumors based on our exam and results.  Please take ibuprofen 400 mg and Tylenol 650 mg round the clock for the next 3 days. Please see the neurologist in 1 to 2 weeks.  Please return to the ER if the headache gets severe and in not improving, you have associated new one sided numbness, tingling, weakness or confusion, seizures, poor balance or poor vision.

## 2020-03-26 ENCOUNTER — Telehealth: Payer: Self-pay | Admitting: *Deleted

## 2020-03-26 NOTE — Telephone Encounter (Signed)
Spouse called and reported the patient is still having muscle spasms.  Per Dr Oneta Rack, increase the Gabapentin 600 mg to 3 to 4 times a day, 1 tablet at each meal and at bedtime. Spouse is aware and patient has an office visit 03/27/2020 with Dr Oneta Rack.

## 2020-03-27 ENCOUNTER — Other Ambulatory Visit: Payer: Self-pay

## 2020-03-27 ENCOUNTER — Ambulatory Visit (INDEPENDENT_AMBULATORY_CARE_PROVIDER_SITE_OTHER): Payer: BC Managed Care – PPO | Admitting: Internal Medicine

## 2020-03-27 ENCOUNTER — Encounter: Payer: Self-pay | Admitting: Internal Medicine

## 2020-03-27 VITALS — BP 126/86 | HR 80 | Temp 97.0°F | Resp 16 | Ht 71.0 in | Wt 254.2 lb

## 2020-03-27 DIAGNOSIS — F419 Anxiety disorder, unspecified: Secondary | ICD-10-CM | POA: Diagnosis not present

## 2020-03-27 DIAGNOSIS — R2990 Unspecified symptoms and signs involving the nervous system: Secondary | ICD-10-CM | POA: Diagnosis not present

## 2020-03-27 DIAGNOSIS — G4489 Other headache syndrome: Secondary | ICD-10-CM

## 2020-03-27 NOTE — Progress Notes (Signed)
Subjective:     Patient ID: Shane Saunders, male   DOB: [birth year], 49 y.o.   MRN: [id number]  HPI         Shane Saunders - a 49 yo MWM once again presents acutely with c/o post occipital HA which he was also seen 2 nights ago at the ER & had a negative CT Angio of the Head & Neck.      Over the last 3 + month beginning about Dec 16, patient was 1st seen with c/o intermittent "swelling of his lip. He was 1st seen by a local ENT Dr, then referred to [facility] & was seen by a 2sd ENT Dr  and referred there to a Neurologist (headache specialist) and also a dermatologist.  Then back in [place] was seen by Psychiatrist (Dr Evelene Croon) and patient had a dysphoric experience with Viibyrd. Then he was seen by a 2sd [place] ENT Dr. Next by a Urologist and also by Dr Vickey Huger and was dx'd with severe OSA . Then was consulted by Dr Marjory Lies. After one of his recent ER visits, he was referred to Hematology and also Cardiology by the ER Doctors and has also been seen by 2 other providers in the this office. Fortunately after numerous CT  & MRI scans and exhaustive labs, No serious pathology has been discovered.by any of the 14-15 Drs that he's seen other than his abn sleep studies.      Patient describes post occipital HA's and apparent tenderness in bilat parietal scalp as well as his neck & along his spine. He describes numb & tingling peri-oral and distal extremity paresthesias. He reports last night after a "good sleep" with Xanax that he feels the best in a long time. He still reports poor balance & occas light-headedness with rapid standing. Denies vertigo type sx's.   Medication Sig  . ALPRAZolam (XANAX) 1 MG tablet Take 1/2 - 1 tablet 2 - 3 x /day ONLY if needed f  . atorvastatin  80 MG tablet TAKE 1 TABLET  EVERY DAY  . buPROPion  XL 150 MG  Take 1 tablet every Morning for Mood, Focus  . ZYRTEC-D 5-120 MG  Takes 1 tablet 2 x /day as needed   . VITAMIN D 250 MCG / 10,000 UT Take 10,000 Units by mouth daily.  Marland Kitchen VITAMIN B  12 tab  Take 1 tablet by mouth daily.  . cyclobenzaprine 10 MG tablet Take 1/2 to 1 tablet 3 x /day as needed for Muscle Spasm or headache  . dexamethasone 2 MG tablet Not taking   . Doxycycline 100 MG capsule Take 1 capsule 2 x /day with meals for Infection   . LEXAPRO 20 MG Take 1 tablet Daily for Mood   . fexofenadine  60 MG tablet Takes 1 tablet 2 x /day as needed for Allergies  . FLONASE  nasal spray Place 1-2 sprays into both nostrils daily as needed  . gabapentin 600 MG tablet Take 1/2 to 1 tablet 2 to 3 x /day   . silodosin / RAPAFLO 8 MG \ Take 1 capsule at Bedtime for Prostate (   No facility-administered medications prior to visit.   Allergies  Allergen Reactions  . Codeine Other (See Comments)    "I cannot sleep"  . Effexor [Venlafaxine] Other (See Comments)    "E.D."  . Prednisone Other (See Comments)    "I cannot sleep"  . Tramadol Hcl Itching  . Zinc Nausea Only and Other (See Comments)    "  Upsets my stomach"   Past Medical History:  Diagnosis Date  . Abnormal glucose   . Borderline hypertension   . BPH (benign prostatic hypertrophy)   . Crushing injury of left elbow and forearm 07/13/2015  . Depression, major, in partial remission ([place]) 07/12/2015  . Dizziness   . Hyperlipidemia   . Hypertension   . Hypogonadism male   . 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 Morbid obesity ([place])   . Obesity (BMI 30-39.9)   . OSA on CPAP   . Prediabetes   . Right ureteral stone   . Sleep apnea    C PAP  . Vertigo   . Vitamin D deficiency   . Wears glasses     Review of Systems    10 point systems review negative except as above.    Objective:   Physical Exam  BP 126/86   Pulse 80   Temp (!) 97 F (36.1 C)   Resp 16   Ht 5\' 11"  (1.803 m)   Wt 254 lb 3.2 oz (115.3 kg)   BMI 35.45 kg/m   HEENT - WNL. Neck - supple.  Chest - Clear equal BS. Cor - Nl HS. RRR w/o sig MGR. PP 1(+). No edema. MS- FROM w/o deformities.  Gait Nl. Neuro -   Nl w/o focal abnormalities.     Assessment:      1. Headache syndrome  - Advised take his Gabapentin 600 mg - 1/2 - 1 tabley 3 x /day as needed    2. Anxiety tension state  - Advised take his Xanax 1 mg - 1/2 tab 3 x /da y with meals & 1 tablet at HS - Discussed anxiety sx's with pt & spouse   3. Multiple neurological symptoms  - f/u 2 week to reassess  Between 15-20 minutes of counseling, chart review, and critical decision making was performed

## 2020-04-04 ENCOUNTER — Telehealth (HOSPITAL_COMMUNITY): Payer: Self-pay | Admitting: *Deleted

## 2020-04-04 NOTE — Telephone Encounter (Signed)
Patient given detailed instructions per Myocardial Perfusion Study Information Sheet for the test on 04/09/20 at 8:15. Patient notified to arrive 15 minutes early and that it is imperative to arrive on time for appointment to keep from having the test rescheduled.  If you need to cancel or reschedule your appointment, please call the office within 24 hours of your appointment. . Patient verbalized understanding.Shane Saunders

## 2020-04-06 ENCOUNTER — Ambulatory Visit: Payer: BC Managed Care – PPO | Attending: Internal Medicine

## 2020-04-06 DIAGNOSIS — Z23 Encounter for immunization: Secondary | ICD-10-CM

## 2020-04-06 NOTE — Progress Notes (Signed)
   Covid-19 Vaccination Clinic  Name:  HAWKIN CHARO    MRN: [id number] DOB: 1970/12/30  04/06/2020  Mr. Spagna was observed post Covid-19 immunization for 15 minutes without incident. He was provided with Vaccine Information Sheet and instruction to access the V-Safe system.   Mr. Wolfinger was instructed to call 911 with any severe reactions post vaccine: Marland Kitchen Difficulty breathing  . Swelling of face and throat  . A fast heartbeat  . A bad rash all over body  . Dizziness and weakness   Immunizations Administered    Name Date Dose VIS Date Route   Pfizer COVID-19 Vaccine 04/06/2020  9:28 AM 0.3 mL 12/09/2019 Intramuscular   Manufacturer: [employer], [employer]   Lot: [id number]   NDC: [address]-1

## 2020-04-09 ENCOUNTER — Ambulatory Visit (HOSPITAL_COMMUNITY): Payer: BC Managed Care – PPO | Attending: Cardiology

## 2020-04-09 DIAGNOSIS — R079 Chest pain, unspecified: Secondary | ICD-10-CM | POA: Insufficient documentation

## 2020-04-09 DIAGNOSIS — R9431 Abnormal electrocardiogram [ECG] [EKG]: Secondary | ICD-10-CM | POA: Diagnosis present

## 2020-04-09 MED ORDER — REGADENOSON 0.4 MG/5ML IV SOLN
0.4000 mg | Freq: Once | INTRAVENOUS | Status: AC
  Administered 2020-04-09: 0.4 mg via INTRAVENOUS

## 2020-04-09 MED ORDER — TECHNETIUM TC 99M TETROFOSMIN IV KIT
32.9000 | PACK | Freq: Once | INTRAVENOUS | Status: AC | PRN
  Administered 2020-04-09: 32.9 via INTRAVENOUS
  Filled 2020-04-09: qty 33

## 2020-04-10 ENCOUNTER — Ambulatory Visit (HOSPITAL_BASED_OUTPATIENT_CLINIC_OR_DEPARTMENT_OTHER): Payer: BC Managed Care – PPO

## 2020-04-10 ENCOUNTER — Ambulatory Visit (INDEPENDENT_AMBULATORY_CARE_PROVIDER_SITE_OTHER): Payer: BC Managed Care – PPO | Admitting: Internal Medicine

## 2020-04-10 ENCOUNTER — Ambulatory Visit (HOSPITAL_COMMUNITY): Payer: BC Managed Care – PPO | Attending: Cardiovascular Disease

## 2020-04-10 ENCOUNTER — Other Ambulatory Visit: Payer: Self-pay

## 2020-04-10 VITALS — BP 116/74 | Ht 71.0 in | Wt 260.0 lb

## 2020-04-10 DIAGNOSIS — R2681 Unsteadiness on feet: Secondary | ICD-10-CM

## 2020-04-10 DIAGNOSIS — I10 Essential (primary) hypertension: Secondary | ICD-10-CM | POA: Diagnosis not present

## 2020-04-10 DIAGNOSIS — G4489 Other headache syndrome: Secondary | ICD-10-CM | POA: Diagnosis not present

## 2020-04-10 DIAGNOSIS — R079 Chest pain, unspecified: Secondary | ICD-10-CM

## 2020-04-10 DIAGNOSIS — R9431 Abnormal electrocardiogram [ECG] [EKG]: Secondary | ICD-10-CM | POA: Diagnosis not present

## 2020-04-10 DIAGNOSIS — F5101 Primary insomnia: Secondary | ICD-10-CM

## 2020-04-10 DIAGNOSIS — F419 Anxiety disorder, unspecified: Secondary | ICD-10-CM | POA: Diagnosis not present

## 2020-04-10 DIAGNOSIS — Z79899 Other long term (current) drug therapy: Secondary | ICD-10-CM

## 2020-04-10 LAB — MYOCARDIAL PERFUSION IMAGING
LV dias vol: 103 mL (ref 62–150)
LV sys vol: 50 mL
Peak HR: 93 {beats}/min
Rest HR: 62 {beats}/min
SDS: 0
SRS: 0
SSS: 0
TID: 1.01

## 2020-04-10 LAB — ECHOCARDIOGRAM COMPLETE: Height: 71 in

## 2020-04-10 MED ORDER — TRAZODONE HCL 300 MG PO TABS: ORAL_TABLET | ORAL | 0 refills | Status: DC

## 2020-04-10 MED ORDER — TECHNETIUM TC 99M TETROFOSMIN IV KIT
31.2000 | PACK | Freq: Once | INTRAVENOUS | Status: AC | PRN
  Administered 2020-04-10: 31.2 via INTRAVENOUS
  Filled 2020-04-10: qty 32

## 2020-04-10 MED ORDER — GABAPENTIN 800 MG PO TABS: ORAL_TABLET | ORAL | 0 refills | Status: DC

## 2020-04-10 NOTE — Progress Notes (Signed)
    Subjective:    Patient ID: Shane Saunders, male    DOB: 1971/09/01, 49 y.o.   MRN: [id number]  HPI    Shane Saunders - a 49 yo MWM presents for F/Uof post occipital HA's and was started on Gabapentin 600 mg at 1/2 -1 tid for his HA's and also Xanax during this time of Stress & high Anxiety for him. He had Myoview yesterday per Dr Eden Emms and he still has f/u with Dr Marjory Lies on April 20 for evaluation of multiple Neurologic Sx's & Unstable Gait with a question of MS. His RPR& Lyme titers did return Negative.  Dr Dohmeier is following for Severe OSA.  Medication Sig  . ALPRAZolam 1 MG tablet Take 1/2 - 1 tablet 2 - 3 x /day ONLY if needed for Anxiety Attack  . atorvastatin  80 MG tablet TAKE 1 TABLET B EVERY DAY  . buPROPion-XL 150 MG  Take 1 tablet every Morning for Mood, Focus & Concentration  .  ZYRTEC-D 5-120 MG  Takes 1 tablet 2 x/day as needed - congestion  . VIT D 250 MCG 10,000 u Take 10,000 Units  daily.  Marland Kitchen VITAMIN B 12 tab Take 1 tablet  daily.  . cyclobenzaprine 10 MG  Take 5-10 mg  3 times daily as needed for muscle spasms or headaches  . doxycycline 100 MG capsule Take 1 capsule 2 x /day with meals for Infection  . escitalopram  20 MG tablet Take 1 tablet Daily for Mood  . fexofenadine  60 MG tablet Takes 1 tablet 2 x /day as needed for Allergies  . FLONASE nasal spray Place 1-2 sprays into both nostrils daily as needed   . gabapentin  600 MG tablet Take 300-600 mg  at bedtime  . silodosin (RAPAFLO 8 MG Take 1 capsule at Bedtime for Prostate     Allergies  Allergen Reactions  . Codeine Other (See Comments)    "I cannot sleep"  . Effexor [Venlafaxine] Other (See Comments)    "E.D."  . Prednisone Other (See Comments)    "I cannot sleep"  . Tramadol Hcl Itching  . Zinc Nausea Only and Other (See Comments)    "Upsets my stomach"    Review of Systems     Objective:   Physical Exam  BP 116/74 Comment: 116/74-sitting and 108/72-standing  Ht 5\' 11"  (1.803 m)   BMI 35.84  kg/m   HEENT - WNL. Neck - supple.  Chest - Clear equal BS. Cor - Nl HS. RRR w/o sig MGR. PP 1(+). No edema. MS- FROM w/o deformities.  Gait Nl. Neuro -  Nl w/o focal abnormalities.    Assessment & Plan:   1. Essential hypertension  2. Headache syndrome  - gabapentin  800 MG tablet; Take 1 tablet 3 x day at 8 am, 5 pm & Bedtime for Chronic Pain & Headaches  Dispense: 270 tablet  3. Anxiety tension state  4. Unstable gait  - Upcoming Consult w/Dr Penumalli  5. Primary insomnia  - gabapentin  800 MG tablet; Take 1 tablet 3 x day at 8 am, 5 pm & Bedtime for Chronic Pain & Headaches  Dispense: 270 tablet  - trazodone  300 MG tablet; Take 1/2 to 1 tablet 1 hour before Bedtime  Dispense: 90 tablet  6. Medication management

## 2020-04-13 ENCOUNTER — Other Ambulatory Visit: Payer: Self-pay | Admitting: Internal Medicine

## 2020-04-13 DIAGNOSIS — L739 Follicular disorder, unspecified: Secondary | ICD-10-CM

## 2020-04-15 ENCOUNTER — Encounter: Payer: Self-pay | Admitting: Internal Medicine

## 2020-04-17 ENCOUNTER — Ambulatory Visit: Payer: BC Managed Care – PPO | Admitting: Diagnostic Neuroimaging

## 2020-04-17 ENCOUNTER — Encounter: Payer: Self-pay | Admitting: Diagnostic Neuroimaging

## 2020-04-17 ENCOUNTER — Other Ambulatory Visit: Payer: Self-pay

## 2020-04-17 VITALS — BP 120/76 | HR 75 | Temp 97.6°F | Ht 70.0 in | Wt 255.6 lb

## 2020-04-17 DIAGNOSIS — Z9989 Dependence on other enabling machines and devices: Secondary | ICD-10-CM

## 2020-04-17 DIAGNOSIS — G43109 Migraine with aura, not intractable, without status migrainosus: Secondary | ICD-10-CM

## 2020-04-17 DIAGNOSIS — F488 Other specified nonpsychotic mental disorders: Secondary | ICD-10-CM | POA: Diagnosis not present

## 2020-04-17 DIAGNOSIS — R269 Unspecified abnormalities of gait and mobility: Secondary | ICD-10-CM | POA: Diagnosis not present

## 2020-04-17 DIAGNOSIS — R5382 Chronic fatigue, unspecified: Secondary | ICD-10-CM

## 2020-04-17 DIAGNOSIS — F3341 Major depressive disorder, recurrent, in partial remission: Secondary | ICD-10-CM

## 2020-04-17 DIAGNOSIS — R4189 Other symptoms and signs involving cognitive functions and awareness: Secondary | ICD-10-CM

## 2020-04-17 DIAGNOSIS — G4733 Obstructive sleep apnea (adult) (pediatric): Secondary | ICD-10-CM

## 2020-04-17 MED ORDER — AIMOVIG 70 MG/ML ~~LOC~~ SOAJ: 70.0000 mg | SUBCUTANEOUS | 4 refills | Status: DC

## 2020-04-17 MED ORDER — RIZATRIPTAN BENZOATE 10 MG PO TBDP: 10.0000 mg | ORAL_TABLET | ORAL | 11 refills | Status: DC | PRN

## 2020-04-17 NOTE — Patient Instructions (Signed)
  MIGRAINE PREVENTION  LIFESTYLE CHANGES -Stop or avoid smoking -Decrease or avoid caffeine / alcohol -Eat and sleep on a regular schedule -Exercise several times per week - start erenumab (Aimovig) 70mg  monthly (may increase to 140mg  monthly)  MIGRAINE RESCUE  - ibuprofen, tylenol as needed - rizatriptan (Maxalt) 10mg  as needed for breakthrough headache; may repeat x 1 after 2 hours; max 2 tabs per day or 8 per month  GAIT DIFFICULTY / LOW BACK PAIN  - check PT evaluation, optimize nutrition, exercise, sleep - check MRI cervical spine; MRI cervical  SEVERE SLEEP APNEA - follow up with CPAP

## 2020-04-17 NOTE — Progress Notes (Signed)
[facility]  PATIENT: Shane Saunders DOB: 1971-02-07  REFERRING CLINICIAN: [name], MD HISTORY FROM: patient and wife  REASON FOR VISIT: new consult    HISTORICAL  CHIEF COMPLAINT:  Chief Complaint  Patient presents with  . Weakness, diplopia    rm 7 wife -Shane Saunders " I can't remember things, jumble my words; balance issues; began wearing my glasses- depth perception is off; muscle spasms in my upper back and ears; teeth chattering"    HISTORY OF PRESENT ILLNESS:   UPDATE (04/17/20, VRP): Since last visit, more headaches, dizzines, gait difficulty. Symptoms are progressive. Throbbing HA, sens to light, dizzy, scalp sens. General gait and balance issues. No alleviating or aggravating factors. Tolerating meds. Gabapentin not helping.     PRIOR HPI (02/06/20): 49 year old male here for evaluation of dizziness and gait difficulty.  2 years ago patient had onset of dizziness and intermittent right lip swelling.  His dizziness is described as feeling off balance, lightheadedness, brain fog and fatigue.  He denies any spinning or vertigo sensations.  However he did follow-up with ENT, apparently had some vestibular testing showing some dysfunction.  He underwent vestibular therapy exercises without relief.  Also was having intermittent right upper lip swelling attacks lasting a few hours at a time and now more consistent.  He went to ENT and dermatology for evaluation without specific diagnosis.  Allergy immunology consult was considered but has not been pursued yet.  He had variety of testing for autoimmune inflammatory etiologies which have been unremarkable.  Patient also has history of depression, on medication in the past.  These medications have been weaned off to see if this would help improve symptoms but unfortunately symptoms have continued.  Patient has history of sleep apnea on CPAP, with testing from 12 years ago.  He has a sleep consult  pending.  Patient has some low back pain issues but denies any numbness or tingling in his feet or legs.   REVIEW OF SYSTEMS: Full 14 system review of systems performed and negative with exception of: As per HPI.   ALLERGIES: Allergies  Allergen Reactions  . Codeine Other (See Comments)    "I cannot sleep"  . Effexor [Venlafaxine] Other (See Comments)    "E.D."  . Prednisone Other (See Comments)    "I cannot sleep"  . Tramadol Hcl Itching  . Zinc Nausea Only and Other (See Comments)    "Upsets my stomach"    HOME MEDICATIONS: Outpatient Medications Prior to Visit  Medication Sig Dispense Refill  . ALPRAZolam (XANAX) 1 MG tablet Take 1/2 - 1 tablet 2 - 3 x /day ONLY if needed for Anxiety Attack &  limit to 5 days /week to avoid Addiction & Dementia (Patient taking differently: Take 0.5-1 mg by mouth See admin instructions. Take 0.5-1 mg by mouth two to three times a day as needed for anxiety attacks- limit to 5 days a week to avoid addiction and dementia) 60 tablet 0  . atorvastatin (LIPITOR) 80 MG tablet TAKE 1 TABLET BY MOUTH EVERY DAY 90 tablet 1  . buPROPion (WELLBUTRIN XL) 150 MG 24 hr tablet Take 1 tablet every Morning for Mood, Focus & Concentration (Patient taking differently: Take 150 mg by mouth in the morning. ) 90 tablet 3  . Cholecalciferol (VITAMIN D3) 250 MCG (10000 UT) TABS Take 10,000 Units by mouth daily.    . Cyanocobalamin (VITAMIN B 12 PO) Take 1 tablet by mouth daily.    . cyclobenzaprine (FLEXERIL) 10 MG  tablet Take 1/2 to 1 tablet 3 x /day as needed for Muscle Spasm or headache (Patient taking differently: Take 5-10 mg by mouth 3 (three) times daily as needed for muscle spasms (or headaches). ) 30 tablet 0  . doxycycline (VIBRAMYCIN) 100 MG capsule TAKE 1 CAPSULE 2 TIMES A DAY WITH MEALS FOR INFECTION 60 capsule 0  . escitalopram (LEXAPRO) 20 MG tablet Take 1 tablet Daily for Mood (Patient taking differently: Take 20 mg by mouth daily. ) 90 tablet 3  .  fexofenadine (ALLEGRA) 60 MG tablet Takes 1 tablet 2 x /day as needed for Allergies (Patient taking differently: Take 60 mg by mouth 2 (two) times daily as needed (for seasonal allergies). )    . gabapentin (NEURONTIN) 800 MG tablet Take 1 tablet 3 x day at 8 am, 5 pm & Bedtime for Chronic Pain & Headaches 270 tablet 0  . nystatin (MYCOSTATIN) 500000 units TABS tablet     . silodosin (RAPAFLO) 8 MG CAPS capsule Take 1 capsule at Bedtime for Prostate (Patient taking differently: Take 8 mg by mouth daily with breakfast. ) 90 capsule 3  . cetirizine-pseudoephedrine (ZYRTEC-D) 5-120 MG tablet Takes 1 tablet 2 x/day as needed - congestion (Patient not taking: Reported on 04/17/2020)    . fluticasone (FLONASE) 50 MCG/ACT nasal spray Place 1-2 sprays into both nostrils daily as needed for allergies or rhinitis.    . trazodone (DESYREL) 300 MG tablet Take 1/2 to 1 tablet 1 hour before Bedtime (Patient not taking: Reported on 04/17/2020) 90 tablet 0  . dexamethasone (DECADRON) 2 MG tablet Take 1 tab 3 x day - 3 days, then 2 x day - 3 days, then 1 tab daily (Patient taking differently: Take 2 mg by mouth See admin instructions. Take 2 mg by mouth three times a day for 3 days, 2 mg two times a day for 3 days, then 2 mg once a day as directed) 20 tablet 0   No facility-administered medications prior to visit.    PAST MEDICAL HISTORY: Past Medical History:  Diagnosis Date  . Abnormal glucose   . Borderline hypertension   . BPH (benign prostatic hypertrophy)   . Crushing injury of left elbow and forearm 07/13/2015  . 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 Dizziness   . Hyperlipidemia   . Hypertension   . Hypogonadism male   . 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 Morbid obesity (HCC)   . Obesity (BMI 30-39.9)   . OSA on CPAP   . Prediabetes   . Right ureteral stone   . Sleep apnea    C PAP  . Vertigo   . Vitamin D deficiency   . Wears glasses     PAST  SURGICAL HISTORY: Past Surgical History:  Procedure Laterality Date  . 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 ARTERY REPAIR Right 05/31/2014   Procedure: IRRIGATION, EXPLORATION, AND REPAIR OF RIGHT ARM WOUND;  Surgeon: Cherylynn Ridges, MD;  Location: [facility] OR;  Service: General;  Laterality: Right;  . CYSTOSCOPY WITH RETROGRADE PYELOGRAM, URETEROSCOPY AND STENT PLACEMENT Right 07/11/2016   Procedure: CYSTOSCOPY WITH RETROGRADE PYELOGRAM, URETEROSCOPY, BASKET STONE EXTRACTION  AND STENT PLACEMENT;  Surgeon: Sebastian Ache, MD;  Location: [facility];  Service: Urology;  Laterality: Right;  45 MINS  C-ARM DIGITAL URETEROSCOPE HOLMIUM LASER  . ESOPHAGOGASTRODUODENOSCOPY  FAMILY HISTORY: Family History  Problem Relation Age of Onset  . Hypertension Mother   . Migraines Mother   . Thyroid disease Mother   . Hypertension Father   . Hyperlipidemia Father   . Diabetes Father   . Cancer Sister        thyroid  . CAD Paternal Grandfather     SOCIAL HISTORY: Social History   Socioeconomic History  . Marital status: Married    Spouse name: Angelica ChessmanMandy  . Number of children: 2  . Years of education: 6212  . Highest education level: Not on file  Occupational History    Comment: na  Tobacco Use  . Smoking status: Former Smoker    Packs/day: 0.50    Years: 10.00    Pack years: 5.00    Types: Cigarettes    Quit date: 10/27/2009    Years since quitting: 10.4  . Smokeless tobacco: Former NeurosurgeonUser    Types: Snuff  Substance and Sexual Activity  . Alcohol use: Yes    Comment: occasional  . Drug use: No  . Sexual activity: Yes    Comment: Vasectomy  Other Topics Concern  . Not on file  Social History Narrative   Lives with wife   Caffeine -tea, 1 daily       Social Determinants of Health   Financial Resource Strain:   . Difficulty of Paying Living Expenses:   Food Insecurity:   . Worried About  Programme researcher, broadcasting/film/videounning Out of Food in the Last Year:   . Baristaan Out of Food in the Last Year:   Transportation Needs:   . Freight forwarderLack of Transportation (Medical):   Marland Kitchen. Lack of Transportation (Non-Medical):   Physical Activity:   . Days of Exercise per Week:   . Minutes of Exercise per Session:   Stress:   . Feeling of Stress :   Social Connections:   . Frequency of Communication with Friends and Family:   . Frequency of Social Gatherings with Friends and Family:   . Attends Religious Services:   . Active Member of Clubs or Organizations:   . Attends BankerClub or Organization Meetings:   Marland Kitchen. Marital Status:   Intimate Partner Violence:   . Fear of Current or Ex-Partner:   . Emotionally Abused:   Marland Kitchen. Physically Abused:   . Sexually Abused:      PHYSICAL EXAM  GENERAL EXAM/CONSTITUTIONAL: Vitals:  Vitals:   04/17/20 1454  BP: 120/76  Pulse: 75  Temp: 97.6 F (36.4 C)  Weight: 255 lb 10.1 oz (116 kg)  Height: 5\' 10"  (1.778 m)   Body mass index is 36.68 kg/m. Wt Readings from Last 3 Encounters:  04/17/20 255 lb 10.1 oz (116 kg)  04/10/20 260 lb (117.9 kg)  04/09/20 257 lb (116.6 kg)    Patient is in no distress; well developed, nourished and groomed; neck is supple  CARDIOVASCULAR:  Examination of carotid arteries is normal; no carotid bruits  Regular rate and rhythm, no murmurs  Examination of peripheral vascular system by observation and palpation is normal  EYES:  Ophthalmoscopic exam of optic discs and posterior segments is normal; no papilledema or hemorrhages No exam data present  MUSCULOSKELETAL:  Gait, strength, tone, movements noted in Neurologic exam below  NEUROLOGIC: MENTAL STATUS:  No flowsheet data found.  awake, alert, oriented to person, place and time  recent and remote memory intact  normal attention and concentration  language fluent, comprehension intact, naming intact  fund of knowledge appropriate  CRANIAL NERVE:  2nd - no papilledema on fundoscopic  exam  2nd, 3rd, 4th, 6th - pupils equal and reactive to light, visual fields full to confrontation, extraocular muscles intact, no nystagmus  5th - facial sensation symmetric  7th - facial strength symmetric  8th - hearing intact  9th - palate elevates symmetrically, uvula midline  11th - shoulder shrug symmetric  12th - tongue protrusion midline  MOTOR:   normal bulk and tone, full strength in the BUE, BLE  SENSORY:   normal and symmetric to light touch, temperature, vibration  COORDINATION:   finger-nose-finger, fine finger movements normal  REFLEXES:   deep tendon reflexes TRACE and symmetric  GAIT/STATION:   narrow based gait; DIFF WITH TANDEM; romberg is negative     DIAGNOSTIC DATA (LABS, IMAGING, TESTING) - I reviewed patient records, labs, notes, testing and imaging myself where available.  Lab Results  Component Value Date   WBC 7.5 03/24/2020   HGB 12.0 (L) 03/24/2020   HCT 33.6 (L) 03/24/2020   MCV 90.8 03/24/2020   PLT PLATELET CLUMPS NOTED ON SMEAR, UNABLE TO ESTIMATE 03/24/2020      Component Value Date/Time   NA 140 03/24/2020 1749   NA 140 05/25/2018 0941   K 4.2 03/24/2020 1749   CL 105 03/24/2020 1749   CO2 24 03/24/2020 1749   GLUCOSE 133 (H) 03/24/2020 1749   BUN 18 03/24/2020 1749   BUN 10 05/25/2018 0941   CREATININE 1.23 03/24/2020 1749   CREATININE 1.32 (H) 03/14/2020 1250   CREATININE 1.27 03/09/2020 1238   CALCIUM 9.5 03/24/2020 1749   PROT 7.2 03/14/2020 1250   ALBUMIN 4.4 03/14/2020 1250   AST 23 03/14/2020 1250   ALT 42 03/14/2020 1250   ALKPHOS 79 03/14/2020 1250   BILITOT 1.1 03/14/2020 1250   GFRNONAA >60 03/24/2020 1749   GFRNONAA >60 03/14/2020 1250   GFRNONAA 65 03/09/2020 1238   GFRAA >60 03/24/2020 1749   GFRAA >60 03/14/2020 1250   GFRAA 76 03/09/2020 1238   Lab Results  Component Value Date   CHOL 144 11/01/2019   HDL 37 (L) 11/01/2019   [place]  11/01/2019     Comment:     . LDL cholesterol not  calculated. Triglyceride levels greater than 400 mg/dL invalidate calculated LDL results. . Reference range: <100 . Desirable range <100 mg/dL for primary prevention;   <70 mg/dL for patients with CHD or diabetic patients  with > or = 2 CHD risk factors. Marland Kitchen LDL-C is now calculated using the Martin-Hopkins  calculation, which is a validated novel method providing  better accuracy than the Friedewald equation in the  estimation of LDL-C.  Cresenciano Genre et al. Annamaria Helling. 8299;371(69): 2061-2068  (http://education.[facility].com/faq/FAQ164)    TRIG 406 (H) 11/01/2019   CHOLHDL 3.9 11/01/2019   Lab Results  Component Value Date   HGBA1C 5.5 11/01/2019   Lab Results  Component Value Date   CVELFYBO17 510 02/02/2020   Lab Results  Component Value Date   TSH 1.78 11/01/2019     11/17/19 MRI brain - Negative for vestibular schwannoma. No cause for hearing loss identified. 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 Otherwise normal MRI brain with contrast.  03/24/20 CTA head / neck - Negative head and neck CTA.    ASSESSMENT AND PLAN  49 y.o. year old male here with constellation of symptoms including:  Dx:  1. Migraine with aura and without status migrainosus, not intractable   2. Gait difficulty  3. Brain fog   4. Chronic fatigue   5. OSA on CPAP   6. Recurrent major depressive disorder, in partial remission (HCC)      PLAN:  MIGRAINE PREVENTION  LIFESTYLE CHANGES -Stop or avoid smoking -Decrease or avoid caffeine / alcohol -Eat and sleep on a regular schedule -Exercise several times per week  - avoid topiramate (due to history of kidney stone; left renal lesion?) - avoid propranolol (due to history of depression) - avoid amitriptyline (already on lexapro, bupropion) - start erenumab (Aimovig) 70mg  monthly (may increase to 140mg  monthly)  MIGRAINE RESCUE  - ibuprofen, tylenol as needed - rizatriptan (Maxalt) 10mg   as needed for breakthrough headache; may repeat x 1 after 2 hours; max 2 tabs per day or 8 per month - consider rimegepant (Nurtec) 75mg  as needed for breakthrough headache; max 8 per month  GAIT DIFFICULTY / LOW BACK PAIN  - check PT evaluation, optimize nutrition, exercise, sleep - check MRI cervical spine; MRI cervical  SEVERE SLEEP APNEA - follow up with CPAP   LIGHTHEADEDNESS (positional / exertional) - follow up with cardiology / PCP  BRAIN FOG / ANXIETY / DEPRESSION  - follow up PCP and psychiatry  INTERMITTENT LIP SWELLING  - ? hereditary angioedema, melkersson-rosenthal or other autoimmune process - follow up with PCP; consider allergy/immunology  Orders Placed This Encounter  Procedures  . Ambulatory referral to Physical Therapy   Meds ordered this encounter  Medications  . Erenumab-aooe (AIMOVIG) 70 MG/ML SOAJ    Sig: Inject 70 mg into the skin every 30 (thirty) days.    Dispense:  3 pen    Refill:  4  . rizatriptan (MAXALT-MLT) 10 MG disintegrating tablet    Sig: Take 1 tablet (10 mg total) by mouth as needed for migraine. May repeat in 2 hours if needed    Dispense:  9 tablet    Refill:  11   Return in about 6 months (around 10/17/2020) for with NP (Amy Lomax).    , MD 04/17/2020, 3:30 PM Certified in Neurology, Neurophysiology and Neuroimaging  [facility] [address] [place], 04/19/2020 [facility] [phone]

## 2020-04-18 ENCOUNTER — Telehealth: Payer: Self-pay | Admitting: *Deleted

## 2020-04-18 NOTE — Telephone Encounter (Signed)
Aimovig PA, key: [id number],  G43.109, avoid topiramate (due to history of kidney stone; left renal lesion?),  avoid propranolol (due to history of depression),  avoid amitriptyline (already on lexapro, bupropion).  Your information has been submitted to [employer]. If [employer] has not responded to your request within 24 hours, contact [employer] at [phone].

## 2020-04-19 ENCOUNTER — Encounter: Payer: Self-pay | Admitting: *Deleted

## 2020-04-19 NOTE — Telephone Encounter (Signed)
Aimovig 70 mg/mL approved 04/18/2020 - 07/18/2020. Approval letter faxed to [facility], f [phone]. My chart sent to patient.

## 2020-04-23 ENCOUNTER — Telehealth: Payer: Self-pay | Admitting: Diagnostic Neuroimaging

## 2020-04-23 NOTE — Telephone Encounter (Signed)
Spoke to the patient he is scheduled at [facility] for 05/02/20.

## 2020-04-23 NOTE — Telephone Encounter (Signed)
LVM for pt to call back about scheduling mri  [employer] Auth: [id number] (exp.. 04/20/20 to 10/16/20)

## 2020-04-30 ENCOUNTER — Ambulatory Visit: Payer: BC Managed Care – PPO | Attending: Internal Medicine

## 2020-04-30 DIAGNOSIS — Z23 Encounter for immunization: Secondary | ICD-10-CM

## 2020-04-30 NOTE — Progress Notes (Signed)
   Covid-19 Vaccination Clinic  Name:  Shane Saunders    MRN: [id number] DOB: [birth year]  04/30/2020  [name] was observed post Covid-19 immunization for 15 minutes without incident. He was provided with Vaccine Information Sheet and instruction to access the V-Safe system.   [name] was instructed to call 911 with any severe reactions post vaccine: Marland Kitchen Difficulty breathing  . Swelling of face and throat  . A fast heartbeat  . A bad rash all over body  . Dizziness and weakness   Immunizations Administered    Name Date Dose VIS Date Route   Pfizer COVID-19 Vaccine 04/30/2020  9:59 AM 0.3 mL 02/22/2019 Intramuscular   Manufacturer: [employer], [employer]   Lot: [id number]   NDC: [address]-9

## 2020-05-02 ENCOUNTER — Ambulatory Visit: Payer: BC Managed Care – PPO

## 2020-05-02 ENCOUNTER — Other Ambulatory Visit: Payer: Self-pay

## 2020-05-02 DIAGNOSIS — R269 Unspecified abnormalities of gait and mobility: Secondary | ICD-10-CM

## 2020-05-02 MED ORDER — GADOBENATE DIMEGLUMINE 529 MG/ML IV SOLN
20.0000 mL | Freq: Once | INTRAVENOUS | Status: AC | PRN
  Administered 2020-05-02: 20 mL via INTRAVENOUS

## 2020-05-04 ENCOUNTER — Ambulatory Visit: Payer: BC Managed Care – PPO | Attending: Diagnostic Neuroimaging

## 2020-05-04 ENCOUNTER — Other Ambulatory Visit: Payer: Self-pay

## 2020-05-04 DIAGNOSIS — R42 Dizziness and giddiness: Secondary | ICD-10-CM

## 2020-05-04 DIAGNOSIS — R2689 Other abnormalities of gait and mobility: Secondary | ICD-10-CM

## 2020-05-04 DIAGNOSIS — M6281 Muscle weakness (generalized): Secondary | ICD-10-CM | POA: Diagnosis present

## 2020-05-04 DIAGNOSIS — R2681 Unsteadiness on feet: Secondary | ICD-10-CM

## 2020-05-04 NOTE — Therapy (Signed)
[facility] [address] [place], [place], [address] Phone: [phone]   Fax:  [phone]  Physical Therapy Evaluation  Patient Details  Name: Shane Saunders MRN: [id number] Date of Birth: 1971-05-17 Referring Provider (PT): Joycelyn SchmidVikram Penumalli, MD   Encounter Date: 05/04/2020  PT End of Session - 05/04/20 0939    Visit Number  1    Number of Visits  13    Date for PT Re-Evaluation  07/03/20   POC for 6 weeks, Cert for 60 days   Authorization Type  [employer]    PT Start Time  0845    PT Stop Time  0932    PT Time Calculation (min)  47 min    Equipment Utilized During Treatment  Gait belt    Activity Tolerance  Patient tolerated treatment well    Behavior During Therapy  [facility]FL for tasks assessed/performed       Past Medical History:  Diagnosis Date  . Abnormal glucose   . Borderline hypertension   . BPH (benign prostatic hypertrophy)   . Crushing injury of left elbow and forearm 07/13/2015  . 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 Dizziness   . Hyperlipidemia   . Hypertension   . Hypogonadism male   . 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 Morbid obesity (HCC)   . Obesity (BMI 30-39.9)   . OSA on CPAP   . Prediabetes   . Right ureteral stone   . Sleep apnea    C PAP  . Vertigo   . Vitamin D deficiency   . Wears glasses     Past Surgical History:  Procedure Laterality Date  . 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 ARTERY REPAIR Right 05/31/2014   Procedure: IRRIGATION, EXPLORATION, AND REPAIR OF RIGHT ARM WOUND;  Surgeon: Cherylynn RidgesJames O Wyatt, MD;  Location: [facility] OR;  Service: General;  Laterality: Right;  . CYSTOSCOPY WITH RETROGRADE PYELOGRAM, URETEROSCOPY AND STENT PLACEMENT Right 07/11/2016   Procedure: CYSTOSCOPY WITH RETROGRADE PYELOGRAM, URETEROSCOPY, BASKET STONE EXTRACTION  AND STENT  PLACEMENT;  Surgeon: Sebastian Acheheodore Manny, MD;  [facility];  Service: Urology;  Laterality: Right;  45 MINS  C-ARM DIGITAL URETEROSCOPE HOLMIUM LASER  . ESOPHAGOGASTRODUODENOSCOPY      There were no vitals filed for this visit.   Subjective Assessment - 05/04/20 0848    Subjective  Patient reporting that he started experiencing symptoms started approx. 2 years ago including headaches/dizziness/difficulty with walking. Dizziness/headaches has increased in the last couple month, with reports that he has a headache almost every day in the past three months. Sometimes headaches get so extreme he cannot wear a hat on his head. Patient reports that some days he has some unsteadiness with walking, but some days are worse than others. Reports he went to ENT and they completed some exercises that flared up the dizziness. Reports feeling disoriented and lightheadedness when having the dizziness. One instance of dizziness lead to a near fall, where he bent down and come up quickly in which he fell sideways into a shed. Also reports back pain, but does not believe it is associated with dizziness and headaches. Just had MRI completed to rule in/out MS diagnosis, awaiting results.    Pertinent History  BPH, prediabetes, dizziness, hypertension, hyperlipidemia, Sleep Apnea, Depression, Obesity    Limitations  Walking  Diagnostic tests  Awaiting result of MRI    Patient Stated Goals  Be able to do all work tasks without symptoms.    Currently in Pain?  Yes    Pain Score  3     Pain Location  Back    Pain Orientation  Lower    Pain Descriptors / Indicators  Aching    Pain Type  Chronic pain    Pain Onset  More than a month ago    Pain Frequency  Intermittent    Aggravating Factors   Movements (Bending Over, Standing in one Place)    Pain Relieving Factors  Rest, Heat         [facility] PT Assessment - 05/04/20 0901      Assessment   Medical Diagnosis  Gait Difficulty    Referring  Provider (PT)  Joycelyn Schmid, MD    Onset Date/Surgical Date  04/17/20   referral date; onset approx 2 years ago.    Hand Dominance  Right    Prior Therapy  Outpatient Therapy for Back      Precautions   Precautions  Fall      Balance Screen   Has the patient fallen in the past 6 months  Yes    How many times?  1 (fell sideways against shed, due to dizziness)     Has the patient had a decrease in activity level because of a fear of falling?   Yes   "somewhat" per patient reports   Is the patient reluctant to leave their home because of a fear of falling?   No      Home Public house manager residence    Living Arrangements  Spouse/significant other    Available Help at Discharge  Family    Type of Home  House    Home Access  Stairs to enter    Entrance Stairs-Number of Steps  2    Entrance Stairs-Rails  Right;Left    Home Layout  One level    Home Equipment  None      Prior Function   Level of Independence  Independent    Vocation  Unemployed    Vocation Requirements  did Holiday representative work prior; Currently applying to office job. 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     Leisure  [employer], [employer]/crystals      Cognition   Overall Cognitive Status  --   pt reports increased difficulty with word finding/thoughts   Memory  Impaired   per patient reports   Memory Impairment  Decreased short term memory   per patient reports     Sensation   Light Touch  Appears Intact      Coordination   Gross Motor Movements are Fluid and Coordinated  Yes    Heel Shin Test  [facility]      Posture/Postural Control   Posture/Postural Control  Postural limitations    Postural Limitations  Rounded Shoulders;Forward head      ROM / Strength   AROM / PROM / Strength  Strength;AROM      AROM   Overall AROM   Within functional limits for tasks performed    Overall AROM Comments  UE/LE ROM WFL with tasks  throughout session      Strength   Overall Strength  Within functional limits for tasks performed    Strength Assessment Site  Shoulder;Elbow;Hip;Knee;Ankle  Right/Left Shoulder  Right;Left    Right Shoulder Flexion  5/5    Left Shoulder Flexion  5/5    Right/Left Elbow  Right;Left    Right Elbow Flexion  5/5    Right Elbow Extension  5/5    Left Elbow Flexion  5/5    Left Elbow Extension  5/5    Right/Left Hip  Right;Left    Right Hip Flexion  5/5    Right Hip ABduction  4+/5    Right Hip ADduction  5/5    Left Hip Flexion  4+/5    Left Hip ABduction  4+/5    Left Hip ADduction  5/5    Right/Left Knee  Right;Left    Right Knee Flexion  5/5    Right Knee Extension  5/5    Left Knee Flexion  5/5    Left Knee Extension  5/5    Right/Left Ankle  Right;Left    Right Ankle Dorsiflexion  5/5    Left Ankle Dorsiflexion  5/5      Transfers   Transfers  Sit to Stand;Stand to Sit    Sit to Stand  6: Modified independent (Device/Increase time)    Five time sit to stand comments   completed in 24.96 secs w/o UE; difficulty with coming full upright with standing    Stand to Sit  6: Modified independent (Device/Increase time)      Ambulation/Gait   Ambulation/Gait  Yes    Ambulation/Gait Assistance  5: Supervision    Ambulation/Gait Assistance Details  supv for safety    Ambulation Distance (Feet)  75 Feet    Assistive device  None    Ambulation Surface  Level;Indoor    Gait velocity  10.97 secs = 2.98 ft/sec      Standardized Balance Assessment   Standardized Balance Assessment  Timed Up and Go Test      Timed Up and Go Test   TUG  Normal TUG    Normal TUG (seconds)  11.91           Vestibular Assessment - 05/04/20 0918      Symptom Behavior   Subjective history of current problem  Has been experiencing dizziness/headaches for past two year, with patient reports that it comes/goes intermittently and describes it as lightheadness    Type of Dizziness   Lightheadedness     Frequency of Dizziness  Intermittent; Depends on Motions    Symptom Nature  Motion provoked    Aggravating Factors  Rolling to right;Rolling to left;Forward bending   coming up from forward bending   Relieving Factors  Rest    Progression of Symptoms  Worse      Oculomotor Exam   Oculomotor Alignment  Normal    Spontaneous  Absent    Gaze-induced   Absent    Smooth Pursuits  Intact    Saccades  Undershoots   pt reprts  "difficulty finding left finger"   Comment  patient reports double vision at times (especially with reading tasks)       Vestibulo-Ocular Reflex   VOR 1 Head Only (x 1 viewing)  completed horizontal, delayed dizziness onset (30 seconds after) with quick resolution      Visual Acuity   Static  5    Dynamic  5   but increased dizziness (rated: 3/10)      Positional Sensitivities   Nose to Right Knee  No dizziness    Right Knee to Sitting  Lightedness  Nose to Left Knee  No dizziness    Left Knee to Sitting  Lightheadedness          Objective measurements completed on examination: See above findings.              PT Education - 05/04/20 0939    Education Details  Educated on POC and Evaluation Findings    Person(s) Educated  Patient    Methods  Explanation    Comprehension  Verbalized understanding       PT Short Term Goals - 05/04/20 0951      PT SHORT TERM GOAL #1   Title  Patient will be independent with initial HEP    Time  3    Period  Weeks    Status  New    Target Date  05/25/20      PT SHORT TERM GOAL #2   Title  FGA to be assessed and LTG set as appropriate    Time  3    Period  Weeks    Status  New    Target Date  05/25/20        PT Long Term Goals - 05/04/20 1008      PT LONG TERM GOAL #1   Title  Patient will be independent with final HEP    Time  6    Period  Weeks    Status  New    Target Date  06/15/20      PT LONG TERM GOAL #2   Title  Patient will improve gait speed >/= 3.5 ft/sec to demonstrate  improved mobility    Baseline  2.98 ft/sec    Time  6    Period  Weeks    Status  New    Target Date  06/15/20      PT LONG TERM GOAL #3   Title  Patient will completed TUG in </= 10 secs to demonstrate reduced risk for falls.    Baseline  11.91    Time  6    Period  Weeks    Status  New    Target Date  06/15/20      PT LONG TERM GOAL #4   Title  Pt will report at least 50% improvement in symptoms regarding dizziness and headaches    Time  6    Period  Weeks    Status  New    Target Date  06/15/20      PT LONG TERM GOAL #5   Title  Patient will improve FGA by 5 points from baseline to demonstrate improved balance and reduced risk for falls    Time  6    Period  Weeks    Status  New    Target Date  06/15/20             Plan - 05/04/20 9678    Clinical Impression Statement  Patient is a 49 y.o. male referred to Neuro OPPT for gait difficulty. Patient's PMH is significant for: BPH, prediabetes, dizziness, hypertension, hyperlipidemia, sleep apnea, depression, and obesity. Patient reports increased dizziness/headaches along with balance issues leading to gait instability that has gradually worsened over the past two years, along with reports of brain fog and vision changes. Currently waiting MRI results to rule in/out diagnosis of MS. Overall patient is demonstrating gait speed at 2.98 ft/sec which is indicative of community ambulator. No instances of unsteadiness with ambulation today, but further dynamic gait to be assessed. Patient also experiencing  increased dizziness with horizontal head movements during vestibular assessment, and verbal reports increased dizziness with positional movements. Further vestibular screening to be completed at next session. Patient will benefit from skilled PT services to address balance, dizziness, and gait difficulty to allow for improvements in overall functional mobility.    Personal Factors and Comorbidities  Comorbidity 3+;Time since onset of  injury/illness/exacerbation    Comorbidities  BPH, prediabetes, dizziness, hypertension, hyperlipidemia, Sleep Apnea, Depression, Obesity    Examination-Activity Limitations  Bend;Lift    Examination-Participation Restrictions  Driving;Other   Work   Conservation officer, historic buildings  Evolving/Moderate complexity    Clinical Decision Making  Moderate    Rehab Potential  Good    PT Frequency  2x / week    PT Duration  6 weeks    PT Treatment/Interventions  ADLs/Self Care Home Management;Canalith Repostioning;Cryotherapy;Moist Heat;Gait training;Stair training;Functional mobility training;Therapeutic activities;Therapeutic exercise;Balance training;Neuromuscular re-education;Patient/family education;Vestibular    PT Next Visit Plan  Complete MSQ, Positional Testing for BPPV, FGA, Initiate HEP (Balance)    Consulted and Agree with Plan of Care  Patient       Patient will benefit from skilled therapeutic intervention in order to improve the following deficits and impairments:  Abnormal gait, Decreased balance, Decreased mobility, Difficulty walking, Impaired vision/preception, Dizziness, Decreased cognition, Decreased activity tolerance, Decreased strength, Pain  Visit Diagnosis: Unsteadiness on feet  Dizziness and giddiness  Other abnormalities of gait and mobility  Muscle weakness (generalized)     Problem List Patient Active Problem List   Diagnosis Date Noted  . Obstructive sleep apnea hypopnea, severe 03/16/2020  . 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  . Excessive daytime sleepiness 02/13/2020  . Chronic fatigue 02/13/2020  . BPH with obstruction/lower urinary tract symptoms   . Prediabetes 10/11/2018  . Former smoker 10/11/2018  . Family history of ischemic heart disease 10/11/2018  . FH: hypertension 03/23/2018  . Crushing injury of left elbow and forearm 07/13/2015  . 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 Medication management 02/08/2014  .  Essential hypertension   . Hyperlipidemia, mixed   . OSA on CPAP   . Abnormal glucose   . Morbid obesity (HCC)   . Vitamin D deficiency     Tempie Donning, PT, DPT 05/04/2020, 11:32 AM  [facility] [address] [place], [place], [address] Phone: [phone]   Fax:  [phone]  Name: Shane Saunders MRN: [id number] Date of Birth: 08/23/1971

## 2020-05-06 ENCOUNTER — Other Ambulatory Visit: Payer: Self-pay | Admitting: Internal Medicine

## 2020-05-06 DIAGNOSIS — L739 Follicular disorder, unspecified: Secondary | ICD-10-CM

## 2020-05-06 NOTE — Progress Notes (Signed)
Annual  Screening/Preventative Visit  & Comprehensive Evaluation & Examination     This very nice 49 y.o. MWM presents for a Screening /Preventative Visit & comprehensive evaluation and management of multiple medical co-morbidities.  Patient has been followed for HTN, HLD, Depression. Prediabetes and Vitamin D Deficiency.     Between Nov  2020 to present, patient has presented to 9 different providers with c/o  Rt facial & lip swelling, facial paresthesias, unsteady gait,  He's been seen by at least 9 different Physicians including  Dermatologists, ENT Doctors, Sleep Specialists, Cardiologists, Neurologists with numerous Normal studies including neck CT & and a Brain MRI, ENG's, EEG's and vestibular testing.  He's also  negative labs for  CBC, ESR, hsCRP, ANCA, total Compliment and C3 &C4, Anti-Smith, HLA-B 27 Ag, Angiotensin converting enzyme and C1 Esterase Inhibitor.  Patient had Bx his lip to r/o Granulomatosis Cheilitis ([place]).   Patient had a February Neuro evaluation by Dr Leta Baptist.  On 03/25/2020, patient had a negative CT Angio of the Head & Neck.  He had a Negative Myoview on 04/09/2020 by  Dr Johnsie Cancel.  On 04/17/2020, patient had a 2sd Neuro Consult by Dr Leta Baptist who apparently feels that patient's sx's are consequent of Migraine with aura and without status migrainosus, Brain Fog, chronic fatigue and due to recurrent major depressive disorder. Dr Leta Baptist changed patient Migraine med regimen to Aimovig monthly with migraine rescue with Maxalt.      Today, patient describes his HA's as daily. . . . Frontal, Bitemporal and vertex of a aching quality and denies any orbital /occular discomfort. No visual sx's. Today he also c/o difficulty achieving climax or orgasm with intimacy.      Patient has been dx'd with Severe OSA and is on CPAP per Dr Brett Fairy.      HTN predates circa 2010. Patient's BP has been controlled at home.  Today's BP is at goal - 124/84. Patient  denies any cardiac symptoms as chest pain, palpitations, shortness of breath, dizziness or ankle swelling.     Patient's hyperlipidemia is controlled with diet and medications. Patient denies myalgias or other medication SE's. Last lipids were at goal except very elevated Trig's:  Lab Results  Component Value Date   CHOL 144 11/01/2019   HDL 37 (L) 11/01/2019   LDLCALC LDL not calculated 11/01/2019   TRIG 406 (H) 11/01/2019   CHOLHDL 3.9 11/01/2019       Patient has Morbid Obesity (BMI 37+) & consequent hx/o prediabetes (A1c 5.8% / 2016).   Patient denies reactive hypoglycemic symptoms, visual blurring, diabetic polys or paresthesias. Last A1c was Normal & at goal:  Lab Results  Component Value Date   HGBA1C 5.5 11/01/2019        Finally, patient has history of Vitamin D Deficiency ("33" / 2008  & "29" / 2016)  and last vitamin D was at goal:  Lab Results  Component Value Date   VD25OH 72 11/01/2019    Current Outpatient Medications on File Prior to Visit  Medication Sig  . ALPRAZolam (XANAX) 1 MG tablet Take 1/2 - 1 tablet 2 - 3 x /day ONLY if needed for Anxiety Attack &  limit to 5 days /week to avoid Addiction & Dementia (Patient taking differently: Take 0.5-1 mg by mouth See admin instructions. Take 0.5-1 mg by mouth two to three times a day as needed for anxiety attacks- limit to 5 days a week to avoid addiction and dementia)  . atorvastatin (  LIPITOR) 80 MG tablet TAKE 1 TABLET BY MOUTH EVERY DAY  . buPROPion (WELLBUTRIN XL) 150 MG 24 hr tablet Take 1 tablet every Morning for Mood, Focus & Concentration (Patient taking differently: Take 150 mg by mouth in the morning. )  . cetirizine-pseudoephedrine (ZYRTEC-D) 5-120 MG tablet Takes 1 tablet 2 x/day as needed - congestion  . Cholecalciferol (VITAMIN D3) 250 MCG (10000 UT) TABS Take 10,000 Units by mouth daily.  . Cyanocobalamin (VITAMIN B 12 PO) Take 1 tablet by mouth daily.  . cyclobenzaprine (FLEXERIL) 10 MG tablet Take 1/2  to 1 tablet 3 x /day as needed for Muscle Spasm or headache (Patient taking differently: Take 5-10 mg by mouth 3 (three) times daily as needed for muscle spasms (or headaches). )  . doxycycline (VIBRAMYCIN) 100 MG capsule Take 1 capsule 2 x/day with food for Folliculitis  . Erenumab-aooe (AIMOVIG) 70 MG/ML SOAJ Inject 70 mg into the skin every 30 (thirty) days.  Marland Kitchen escitalopram (LEXAPRO) 20 MG tablet Take 1 tablet Daily for Mood (Patient taking differently: Take 20 mg by mouth daily. )  . fexofenadine (ALLEGRA) 60 MG tablet Takes 1 tablet 2 x /day as needed for Allergies (Patient taking differently: Take 60 mg by mouth 2 (two) times daily as needed (for seasonal allergies). )  . fluticasone (FLONASE) 50 MCG/ACT nasal spray Place 1-2 sprays into both nostrils daily as needed for allergies or rhinitis.  Marland Kitchen gabapentin (NEURONTIN) 800 MG tablet Take 1 tablet 3 x day at 8 am, 5 pm & Bedtime for Chronic Pain & Headaches  . nystatin (MYCOSTATIN) 500000 units TABS tablet   . rizatriptan (MAXALT-MLT) 10 MG disintegrating tablet Take 1 tablet (10 mg total) by mouth as needed for migraine. May repeat in 2 hours if needed  . silodosin (RAPAFLO) 8 MG CAPS capsule Take 1 capsule at Bedtime for Prostate (Patient taking differently: Take 8 mg by mouth daily with breakfast. )  . trazodone (DESYREL) 300 MG tablet Take 1/2 to 1 tablet 1 hour before Bedtime   No current facility-administered medications on file prior to visit.   Allergies  Allergen Reactions  . Codeine Other (See Comments)    "I cannot sleep"  . Effexor [Venlafaxine] Other (See Comments)    "E.D."  . Prednisone Other (See Comments)    "I cannot sleep"  . Tramadol Hcl Itching  . Zinc Nausea Only and Other (See Comments)    "Upsets my stomach"   Past Medical History:  Diagnosis Date  . Abnormal glucose   . Borderline hypertension   . BPH (benign prostatic hypertrophy)   . Crushing injury of left elbow and forearm 07/13/2015  . Depression,  major, in partial remission ([place]) 07/12/2015  . Dizziness   . Hyperlipidemia   . Hypertension   . Hypogonadism male   . 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 Morbid obesity ([place])   . Obesity (BMI 30-39.9)   . OSA on CPAP   . Prediabetes   . Right ureteral stone   . Sleep apnea    C PAP  . Vertigo   . Vitamin D deficiency   . Wears glasses    Health Maintenance  Topic Date Due  . INFLUENZA VACCINE  07/29/2020  . TETANUS/TDAP  05/31/2024  . COLONOSCOPY  09/25/2026  . COVID-19 Vaccine  Completed  . HIV Screening  Completed   Immunization History  Administered Date(s) Administered  . Influenza Inj Mdck Quad With Preservative  10/12/2018, 11/01/2019  . Influenza-Unspecified 10/20/2014  . PFIZER SARS-COV-2 Vaccination 04/06/2020, 04/30/2020  . PPD Test 01/16/2015, 02/18/2017, 03/23/2018, 04/25/2019, 05/07/2020  . Pneumococcal-Unspecified 10/27/2006  . Tdap 08/05/2013, 05/31/2014   Last Colon - Never  Past Surgical History:  Procedure Laterality Date  . 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 ARTERY REPAIR Right 05/31/2014   Procedure: IRRIGATION, EXPLORATION, AND REPAIR OF RIGHT ARM WOUND;  Surgeon: Gwenyth Ober, MD;  Location: [facility];  Service: General;  Laterality: Right;  . CYSTOSCOPY WITH RETROGRADE PYELOGRAM, URETEROSCOPY AND STENT PLACEMENT Right 07/11/2016   Procedure: CYSTOSCOPY WITH RETROGRADE PYELOGRAM, URETEROSCOPY, BASKET STONE EXTRACTION  AND STENT PLACEMENT;  Surgeon: Alexis Frock, MD;  Location: [facility];  Service: Urology;  Laterality: Right;  45 MINS  C-ARM DIGITAL URETEROSCOPE HOLMIUM LASER  . ESOPHAGOGASTRODUODENOSCOPY     Family History  Problem Relation Age of Onset  . Hypertension Mother   . Migraines Mother   . Thyroid disease Mother   . Hypertension Father   . Hyperlipidemia Father   . Diabetes Father   . Cancer Sister         thyroid  . CAD Paternal Grandfather    Social History   Socioeconomic History  . Marital status: Married    Spouse name: Leafy Ro  . Number of children: 2  . Years of education: 63  . Highest education level: Not on file  Occupational History    Comment: na  Tobacco Use  . Smoking status: Former Smoker    Packs/day: 0.50    Years: 10.00    Pack years: 5.00    Types: Cigarettes    Quit date: 10/27/2009    Years since quitting: 10.5  . Smokeless tobacco: Former Systems developer    Types: Snuff  Substance and Sexual Activity  . Alcohol use: Yes    Comment: occasional  . Drug use: No  . Sexual activity: Yes    Comment: Vasectomy  Other Topics Concern  . Not on file  Social History Narrative   Lives with wife   Caffeine -tea, 1 daily          ROS Constitutional: Denies fever, chills, weight loss/gain, headaches, insomnia,  night sweats or change in appetite. Does c/o fatigue. Eyes: Denies redness, blurred vision, diplopia, discharge, itchy or watery eyes.  ENT: Denies discharge, congestion, post nasal drip, epistaxis, sore throat, earache, hearing loss, dental pain, Tinnitus, Vertigo, Sinus pain or snoring.  Cardio: Denies chest pain, palpitations, irregular heartbeat, syncope, dyspnea, diaphoresis, orthopnea, PND, claudication or edema Respiratory: denies cough, dyspnea, DOE, pleurisy, hoarseness, laryngitis or wheezing.  Gastrointestinal: Denies dysphagia, heartburn, reflux, water brash, pain, cramps, nausea, vomiting, bloating, diarrhea, constipation, hematemesis, melena, hematochezia, jaundice or hemorrhoids Genitourinary: Denies dysuria, frequency, discharge, hematuria or flank pain. Has urgency, nocturia x 2-3 & occasional hesitancy. Musculoskeletal: Denies arthralgia, myalgia, stiffness, Jt. Swelling, pain, limp or strain/sprain. Denies Falls. Skin: Denies puritis, rash, hives, warts, acne, eczema or change in skin lesion Neuro: No weakness, tremor, incoordination, spasms,  paresthesia or pain Psychiatric: Denies confusion, memory loss or sensory loss. Denies Depression. Endocrine: Denies change in weight, skin, hair change, nocturia, and paresthesia, diabetic polys, visual blurring or hyper / hypo glycemic episodes.  Heme/Lymph: No excessive bleeding, bruising or enlarged lymph nodes.  Physical Exam  BP 124/84   Pulse 65   Temp (!) 96.9 F (36.1 C)   Resp 16   Ht '5\' 10"'$  (  1.778 m)   Wt 258 lb 3.2 oz (117.1 kg)   SpO2 96%   BMI 37.05 kg/m   General Appearance: Well nourished and well groomed and in no apparent distress.  Eyes: PERRLA, EOMs, conjunctiva no swelling or erythema, normal fundi and vessels. Sinuses: No frontal/maxillary tenderness ENT/Mouth: EACs patent / TMs  nl. Nares clear without erythema, swelling, mucoid exudates. Oral hygiene is good. No erythema, swelling, or exudate. Tongue normal, non-obstructing. Tonsils not swollen or erythematous. Hearing normal.  Neck: Supple, thyroid not palpable. No bruits, nodes or JVD. Respiratory: Respiratory effort normal.  BS equal and clear bilateral without rales, rhonci, wheezing or stridor. Cardio: Heart sounds are normal with regular rate and rhythm and no murmurs, rubs or gallops. Peripheral pulses are normal and equal bilaterally without edema. No aortic or femoral bruits. Chest: symmetric with normal excursions and percussion.  Abdomen: Soft, with Nl bowel sounds. Nontender, no guarding, rebound, hernias, masses, or organomegaly.  Lymphatics: Non tender without lymphadenopathy.  Musculoskeletal: Full ROM all peripheral extremities, joint stability, 5/5 strength, and normal gait. Skin: Warm and dry without rashes, lesions, cyanosis, clubbing or  ecchymosis.  Neuro: Cranial nerves intact, reflexes equal bilaterally. Normal muscle tone, no cerebellar symptoms. Sensation intact.  Pysch: Alert and oriented X 3 with normal affect, insight and judgment appropriate.   Assessment and Plan  1. Annual  Preventative/Screening Exam   2. Essential hypertension  - EKG 12-Lead - Urinalysis, Routine w reflex microscopic - Microalbumin / creatinine urine ratio - [place], RETROPERITNL ABD,  LTD - CBC with Differential/Platelet - COMPLETE METABOLIC PANEL WITH GFR - Magnesium - TSH  3. Hyperlipidemia, mixed  - EKG 12-Lead - [place], RETROPERITNL ABD,  LTD - Lipid panel - TSH  4. Abnormal glucose  - EKG 12-Lead - [place], RETROPERITNL ABD,  LTD - Hemoglobin A1c - Insulin, random  5. Vitamin D deficiency  - VITAMIN D 25 Hydroxy  6. OSA on CPAP   7. Vertigo  8. Acute costochondritis  - EKG 12-Lead  9. Cognitive dysfunction   10. Anxiety tension state   11. Unstable gait   12. Multiple neurological symptoms   13. Benign localized prostatic hyperplasia with lower urinary tract symptoms (LUTS)  - PSA  14. Screening for colorectal cancer  - Ambulatory referral to Gastroenterology  15. Screening for ischemic heart disease  - EKG 12-Lead  16. Prostate cancer screening  - PSA  17. FHx: heart disease  - EKG 12-Lead - [place], RETROPERITNL ABD,  LTD  18. Former smoker  - EKG 12-Lead - [place], RETROPERITNL ABD,  LTD  19. Screening for AAA (aortic abdominal aneurysm)  - [place], RETROPERITNL ABD,  LTD  20. Fatigue, unspecified type  - Iron,Total/Total Iron Binding Cap - Vitamin B12 - Testosterone  21. Medication management  - Vitamin B12 - CBC with Differential/Platelet - COMPLETE METABOLIC PANEL WITH GFR - Magnesium - Lipid panel - TSH - Hemoglobin A1c - Insulin, random - VITAMIN D 25 Hydroxy   22. Screening-pulmonary TB  - TB Skin Test         Patient is prescribed Verapamil 180 mg daily to see it has any effect on his HA's.  WRT his anorgasmia, he's advised to cut his Lexapro 20 mg dose in 1/2 to 10 mg and also increase his Bupropion 150 mg XL to 2 tabs & new Rx sent in for the 300 mg XL & recommended 1 month OV for F/U.         Patient was counseled in  prudent diet, weight control to achieve/maintain BMI less than 25, BP monitoring, regular exercise and medications as discussed.  Discussed med effects and SE's. Routine screening labs and tests as requested with regular follow-up as recommended. Over 40 minutes of exam, counseling, chart review and high complex critical decision making was performed   Kirtland Bouchard, MD

## 2020-05-07 ENCOUNTER — Other Ambulatory Visit: Payer: Self-pay

## 2020-05-07 ENCOUNTER — Encounter: Payer: Self-pay | Admitting: Internal Medicine

## 2020-05-07 ENCOUNTER — Ambulatory Visit (INDEPENDENT_AMBULATORY_CARE_PROVIDER_SITE_OTHER): Payer: BC Managed Care – PPO | Admitting: Internal Medicine

## 2020-05-07 VITALS — BP 124/84 | HR 65 | Temp 96.9°F | Resp 16 | Ht 70.0 in | Wt 258.2 lb

## 2020-05-07 DIAGNOSIS — E559 Vitamin D deficiency, unspecified: Secondary | ICD-10-CM | POA: Diagnosis not present

## 2020-05-07 DIAGNOSIS — M940 Chondrocostal junction syndrome [Tietze]: Secondary | ICD-10-CM

## 2020-05-07 DIAGNOSIS — N401 Enlarged prostate with lower urinary tract symptoms: Secondary | ICD-10-CM

## 2020-05-07 DIAGNOSIS — R5383 Other fatigue: Secondary | ICD-10-CM

## 2020-05-07 DIAGNOSIS — Z13220 Encounter for screening for lipoid disorders: Secondary | ICD-10-CM

## 2020-05-07 DIAGNOSIS — R42 Dizziness and giddiness: Secondary | ICD-10-CM

## 2020-05-07 DIAGNOSIS — Z79899 Other long term (current) drug therapy: Secondary | ICD-10-CM

## 2020-05-07 DIAGNOSIS — Z1212 Encounter for screening for malignant neoplasm of rectum: Secondary | ICD-10-CM

## 2020-05-07 DIAGNOSIS — R350 Frequency of micturition: Secondary | ICD-10-CM | POA: Diagnosis not present

## 2020-05-07 DIAGNOSIS — Z1329 Encounter for screening for other suspected endocrine disorder: Secondary | ICD-10-CM

## 2020-05-07 DIAGNOSIS — Z130 Encounter for screening for diseases of the blood and blood-forming organs and certain disorders involving the immune mechanism: Secondary | ICD-10-CM

## 2020-05-07 DIAGNOSIS — Z111 Encounter for screening for respiratory tuberculosis: Secondary | ICD-10-CM

## 2020-05-07 DIAGNOSIS — Z8249 Family history of ischemic heart disease and other diseases of the circulatory system: Secondary | ICD-10-CM

## 2020-05-07 DIAGNOSIS — G43709 Chronic migraine without aura, not intractable, without status migrainosus: Secondary | ICD-10-CM

## 2020-05-07 DIAGNOSIS — F09 Unspecified mental disorder due to known physiological condition: Secondary | ICD-10-CM

## 2020-05-07 DIAGNOSIS — Z136 Encounter for screening for cardiovascular disorders: Secondary | ICD-10-CM | POA: Diagnosis not present

## 2020-05-07 DIAGNOSIS — Z1389 Encounter for screening for other disorder: Secondary | ICD-10-CM

## 2020-05-07 DIAGNOSIS — I10 Essential (primary) hypertension: Secondary | ICD-10-CM

## 2020-05-07 DIAGNOSIS — R7309 Other abnormal glucose: Secondary | ICD-10-CM

## 2020-05-07 DIAGNOSIS — E782 Mixed hyperlipidemia: Secondary | ICD-10-CM

## 2020-05-07 DIAGNOSIS — R2681 Unsteadiness on feet: Secondary | ICD-10-CM

## 2020-05-07 DIAGNOSIS — Z9989 Dependence on other enabling machines and devices: Secondary | ICD-10-CM

## 2020-05-07 DIAGNOSIS — Z1211 Encounter for screening for malignant neoplasm of colon: Secondary | ICD-10-CM

## 2020-05-07 DIAGNOSIS — Z0000 Encounter for general adult medical examination without abnormal findings: Secondary | ICD-10-CM | POA: Diagnosis not present

## 2020-05-07 DIAGNOSIS — Z87891 Personal history of nicotine dependence: Secondary | ICD-10-CM

## 2020-05-07 DIAGNOSIS — R2990 Unspecified symptoms and signs involving the nervous system: Secondary | ICD-10-CM

## 2020-05-07 DIAGNOSIS — Z125 Encounter for screening for malignant neoplasm of prostate: Secondary | ICD-10-CM | POA: Diagnosis not present

## 2020-05-07 DIAGNOSIS — Z131 Encounter for screening for diabetes mellitus: Secondary | ICD-10-CM | POA: Diagnosis not present

## 2020-05-07 DIAGNOSIS — F419 Anxiety disorder, unspecified: Secondary | ICD-10-CM

## 2020-05-07 DIAGNOSIS — Z0001 Encounter for general adult medical examination with abnormal findings: Secondary | ICD-10-CM

## 2020-05-07 DIAGNOSIS — G4733 Obstructive sleep apnea (adult) (pediatric): Secondary | ICD-10-CM

## 2020-05-07 MED ORDER — BUPROPION HCL ER (XL) 300 MG PO TB24: ORAL_TABLET | ORAL | 1 refills | Status: DC

## 2020-05-07 MED ORDER — VERAPAMIL HCL ER 180 MG PO TBCR: EXTENDED_RELEASE_TABLET | ORAL | 1 refills | Status: DC

## 2020-05-07 NOTE — Patient Instructions (Addendum)

## 2020-05-08 ENCOUNTER — Ambulatory Visit: Payer: BC Managed Care – PPO

## 2020-05-08 LAB — CBC WITH DIFFERENTIAL/PLATELET
Absolute Monocytes: 610 cells/uL (ref 200–950)
Basophils Absolute: 107 cells/uL (ref 0–200)
Basophils Relative: 1.6 %
Eosinophils Absolute: 697 cells/uL — ABNORMAL HIGH (ref 15–500)
Eosinophils Relative: 10.4 %
HCT: 44.4 % (ref 38.5–50.0)
Hemoglobin: 15.5 g/dL (ref 13.2–17.1)
Lymphs Abs: 1729 cells/uL (ref 850–3900)
MCH: 32.8 pg (ref 27.0–33.0)
MCHC: 34.9 g/dL (ref 32.0–36.0)
MCV: 94.1 fL (ref 80.0–100.0)
MPV: 12.7 fL — ABNORMAL HIGH (ref 7.5–12.5)
Monocytes Relative: 9.1 %
Neutro Abs: 3558 cells/uL (ref 1500–7800)
Neutrophils Relative %: 53.1 %
RBC: 4.72 10*6/uL (ref 4.20–5.80)
RDW: 13.1 % (ref 11.0–15.0)
Total Lymphocyte: 25.8 %
WBC: 6.7 10*3/uL (ref 3.8–10.8)

## 2020-05-08 LAB — MICROALBUMIN / CREATININE URINE RATIO
Creatinine, Urine: 171 mg/dL (ref 20–320)
Microalb Creat Ratio: 1 mcg/mg creat (ref <30)
Microalb, Ur: 0.2 mg/dL

## 2020-05-08 LAB — COMPLETE METABOLIC PANEL WITH GFR
AG Ratio: 2 (calc) (ref 1.0–2.5)
ALT: 30 U/L (ref 9–46)
AST: 19 U/L (ref 10–35)
Albumin: 4.3 g/dL (ref 3.6–5.1)
Alkaline phosphatase (APISO): 64 U/L (ref 35–144)
BUN: 13 mg/dL (ref 7–25)
CO2: 30 mmol/L (ref 20–32)
Calcium: 9.4 mg/dL (ref 8.6–10.3)
Chloride: 105 mmol/L (ref 98–110)
Creat: 1.23 mg/dL (ref 0.70–1.33)
GFR, Est African American: 79 mL/min/{1.73_m2} (ref >=60)
GFR, Est Non African American: 68 mL/min/{1.73_m2} (ref >=60)
Globulin: 2.1 g/dL (calc) (ref 1.9–3.7)
Glucose, Bld: 92 mg/dL (ref 65–99)
Potassium: 4.1 mmol/L (ref 3.5–5.3)
Sodium: 141 mmol/L (ref 135–146)
Total Bilirubin: 0.6 mg/dL (ref 0.2–1.2)
Total Protein: 6.4 g/dL (ref 6.1–8.1)

## 2020-05-08 LAB — URINALYSIS, ROUTINE W REFLEX MICROSCOPIC
Bilirubin Urine: NEGATIVE
Glucose, UA: NEGATIVE
Hgb urine dipstick: NEGATIVE
Ketones, ur: NEGATIVE
Leukocytes,Ua: NEGATIVE
Nitrite: NEGATIVE
Protein, ur: NEGATIVE
Specific Gravity, Urine: 1.021 (ref 1.001–1.03)
pH: 6 (ref 5.0–8.0)

## 2020-05-08 LAB — LIPID PANEL
Cholesterol: 184 mg/dL (ref <200)
HDL: 34 mg/dL — ABNORMAL LOW (ref >=40)
LDL Cholesterol (Calc): 116 mg/dL (calc) — ABNORMAL HIGH
Non-HDL Cholesterol (Calc): 150 mg/dL (calc) — ABNORMAL HIGH (ref <130)
Total CHOL/HDL Ratio: 5.4 (calc) — ABNORMAL HIGH (ref <5.0)
Triglycerides: 217 mg/dL — ABNORMAL HIGH (ref <150)

## 2020-05-08 LAB — IRON, TOTAL/TOTAL IRON BINDING CAP
%SAT: 29 % (calc) (ref 20–48)
Iron: 87 ug/dL (ref 50–180)
TIBC: 305 mcg/dL (calc) (ref 250–425)

## 2020-05-08 LAB — HEMOGLOBIN A1C
Hgb A1c MFr Bld: 5.1 % of total Hgb (ref <5.7)
Mean Plasma Glucose: 100 (calc)
eAG (mmol/L): 5.5 (calc)

## 2020-05-08 LAB — VITAMIN B12: Vitamin B-12: 1040 pg/mL (ref 200–1100)

## 2020-05-08 LAB — TESTOSTERONE: Testosterone: 390 ng/dL (ref 250–827)

## 2020-05-08 LAB — PSA: PSA: 0.3 ng/mL (ref <=4.0)

## 2020-05-08 LAB — VITAMIN D 25 HYDROXY (VIT D DEFICIENCY, FRACTURES): Vit D, 25-Hydroxy: 84 ng/mL (ref 30–100)

## 2020-05-08 LAB — INSULIN, RANDOM: Insulin: 22.1 u[IU]/mL — ABNORMAL HIGH

## 2020-05-08 LAB — MAGNESIUM: Magnesium: 2.2 mg/dL (ref 1.5–2.5)

## 2020-05-08 LAB — TSH: TSH: 1.14 mIU/L (ref 0.40–4.50)

## 2020-05-11 ENCOUNTER — Other Ambulatory Visit: Payer: Self-pay

## 2020-05-11 ENCOUNTER — Ambulatory Visit: Payer: BC Managed Care – PPO

## 2020-05-11 DIAGNOSIS — R2681 Unsteadiness on feet: Secondary | ICD-10-CM

## 2020-05-11 DIAGNOSIS — R42 Dizziness and giddiness: Secondary | ICD-10-CM

## 2020-05-11 DIAGNOSIS — R2689 Other abnormalities of gait and mobility: Secondary | ICD-10-CM

## 2020-05-11 DIAGNOSIS — M6281 Muscle weakness (generalized): Secondary | ICD-10-CM

## 2020-05-11 NOTE — Therapy (Addendum)
[facility] [address] [place], [place], [address] Phone: [phone]   Fax:  [phone]  Physical Therapy Treatment  Patient Details  Name: Shane Saunders MRN: [id number] Date of Birth: 09-18-1971 Referring Provider (PT): Andrey Spearman, MD   Encounter Date: 05/11/2020  PT End of Session - 05/11/20 1045    Visit Number  2    Number of Visits  13    Date for PT Re-Evaluation  07/03/20   POC for 6 weeks, Cert for 60 days   Authorization Type  BC[employer]    PT Start Time  [phone]    PT Stop Time  0930    PT Time Calculation (min)  44 min    Equipment Utilized During Treatment  Gait belt    Activity Tolerance  Patient tolerated treatment well    Behavior During Therapy  [facility] for tasks assessed/performed       Past Medical History:  Diagnosis Date  . Abnormal glucose   . Borderline hypertension   . BPH (benign prostatic hypertrophy)   . Crushing injury of left elbow and forearm 07/13/2015  . Depression, major, in partial remission ([place]) 07/12/2015  . Dizziness   . Hyperlipidemia   . Hypertension   . Hypogonadism male   . 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 Morbid obesity ([place])   . Obesity (BMI 30-39.9)   . OSA on CPAP   . Prediabetes   . Right ureteral stone   . Sleep apnea    C PAP  . Vertigo   . Vitamin D deficiency   . Wears glasses     Past Surgical History:  Procedure Laterality Date  . 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 ARTERY REPAIR Right 05/31/2014   Procedure: IRRIGATION, EXPLORATION, AND REPAIR OF RIGHT ARM WOUND;  Surgeon: Gwenyth Ober, MD;  Location: [facility];  Service: General;  Laterality: Right;  . CYSTOSCOPY WITH RETROGRADE PYELOGRAM, URETEROSCOPY AND STENT PLACEMENT Right 07/11/2016   Procedure: CYSTOSCOPY WITH RETROGRADE PYELOGRAM, URETEROSCOPY, BASKET STONE EXTRACTION  AND STENT  PLACEMENT;  Surgeon: Alexis Frock, MD;  Location: [facility];  Service: Urology;  Laterality: Right;  45 MINS  C-ARM DIGITAL URETEROSCOPE HOLMIUM LASER  . ESOPHAGOGASTRODUODENOSCOPY      There were no vitals filed for this visit.  Subjective Assessment - 05/11/20 0848    Subjective  Reports doing well since evaluation. Headaches/dizziness is still the same. No falls.    Pertinent History  BPH, prediabetes, dizziness, hypertension, hyperlipidemia, Sleep Apnea, Depression, Obesity    Limitations  Walking    Diagnostic tests  Awaiting result of MRI    Patient Stated Goals  Be able to do all work tasks without symptoms.    Currently in Pain?  No/denies    Pain Onset  More than a month ago         [facility] PT Assessment - 05/11/20 0901      Functional Gait  Assessment   Gait assessed   Yes    Gait Level Surface  Walks 20 ft in less than 7 sec but greater than 5.5 sec, uses assistive device, slower speed, mild gait deviations, or deviates 6-10 in outside of the 12 in walkway width.    Change in Gait Speed  Able to smoothly change walking speed without loss of balance or gait deviation. Deviate  no more than 6 in outside of the 12 in walkway width.    Gait with Horizontal Head Turns  Performs head turns smoothly with slight change in gait velocity (eg, minor disruption to smooth gait path), deviates 6-10 in outside 12 in walkway width, or uses an assistive device.    Gait with Vertical Head Turns  Performs task with slight change in gait velocity (eg, minor disruption to smooth gait path), deviates 6 - 10 in outside 12 in walkway width or uses assistive device    Gait and Pivot Turn  Pivot turns safely in greater than 3 sec and stops with no loss of balance, or pivot turns safely within 3 sec and stops with mild imbalance, requires small steps to catch balance.    Step Over Obstacle  Is able to step over 2 stacked shoe boxes taped together (9 in total height) without changing  gait speed. No evidence of imbalance.    Gait with Narrow Base of Support  Ambulates less than 4 steps heel to toe or cannot perform without assistance.    Gait with Eyes Closed  Walks 20 ft, slow speed, abnormal gait pattern, evidence for imbalance, deviates 10-15 in outside 12 in walkway width. Requires more than 9 sec to ambulate 20 ft.    Ambulating Backwards  Walks 20 ft, slow speed, abnormal gait pattern, evidence for imbalance, deviates 10-15 in outside 12 in walkway width.    Steps  Alternating feet, must use rail.    Total Score  18    FGA comment:  18/30 = High Fall Risk          Vestibular Assessment - 05/11/20 1043      Positional Testing   Dix-Hallpike  Dix-Hallpike Right;Dix-Hallpike Left      Dix-Hallpike Right   Dix-Hallpike Right Symptoms  No nystagmus      Dix-Hallpike Left   Dix-Hallpike Left Symptoms  No nystagmus      Positional Sensitivities   Right Hallpike  No dizziness    Up from Right Hallpike  Lightheadedness    Up from Left Hallpike  Lightheadedness    Rolling Right  No dizziness    Rolling Left  No dizziness               [facility] Adult PT Treatment/Exercise - 05/11/20 0001      Transfers   Transfers  Sit to Stand;Stand to Sit    Sit to Stand  6: Modified independent (Device/Increase time)    Stand to Sit  6: Modified independent (Device/Increase time)      Ambulation/Gait   Ambulation/Gait  Yes    Ambulation/Gait Assistance  5: Supervision    Ambulation/Gait Assistance Details  completed ambulation in therapy gym throughout session    Ambulation Distance (Feet)  200 Feet    Assistive device  None    Gait Pattern  Step-through pattern;Wide base of support    Ambulation Surface  Level;Indoor      Vestibular Treatment/Exercise - 05/11/20 1041      Vestibular Treatment/Exercise   Vestibular Treatment Provided  Gaze    Gaze Exercises  X1 Viewing Horizontal      X1 Viewing Horizontal   Foot Position  seated on mat; standing feet  apart    Reps  --   2 x 10 seated; 1 x 10 standing   Comments  educated on completion at home seated for safety, posterior LOB/sway with standing VOR x 1.  Balance Exercises - 05/11/20 1040      Balance Exercises: Standing   Standing Eyes Opened  Head turns;Wide (BOA)   EO wide BOS x 30 secs,    Standing Eyes Closed  Narrow base of support (BOS);Solid surface;3 reps;30 secs    Tandem Stance  Eyes open;3 reps;30 secs   modified tandem stance       PT Education - 05/11/20 1044    Education Details  Educated on Initial HEP (see patient instructions)    Person(s) Educated  Patient    Methods  Explanation    Comprehension  Verbalized understanding       PT Short Term Goals - 05/04/20 0951      PT SHORT TERM GOAL #1   Title  Patient will be independent with initial HEP    Time  3    Period  Weeks    Status  New    Target Date  05/25/20      PT SHORT TERM GOAL #2   Title  FGA to be assessed and LTG set as appropriate    Time  3    Period  Weeks    Status  New    Target Date  05/25/20        PT Long Term Goals - 05/04/20 1008      PT LONG TERM GOAL #1   Title  Patient will be independent with final HEP    Time  6    Period  Weeks    Status  New    Target Date  06/15/20      PT LONG TERM GOAL #2   Title  Patient will improve gait speed >/= 3.5 ft/sec to demonstrate improved mobility    Baseline  2.98 ft/sec    Time  6    Period  Weeks    Status  New    Target Date  06/15/20      PT LONG TERM GOAL #3   Title  Patient will completed TUG in </= 10 secs to demonstrate reduced risk for falls.    Baseline  11.91    Time  6    Period  Weeks    Status  New    Target Date  06/15/20      PT LONG TERM GOAL #4   Title  Pt will report at least 50% improvement in symptoms regarding dizziness and headaches    Time  6    Period  Weeks    Status  New    Target Date  06/15/20      PT LONG TERM GOAL #5   Title  Patient will improve FGA by 5 points from  baseline to demonstrate improved balance and reduced risk for falls    Time  6    Period  Weeks    Status  New    Target Date  06/15/20           05/11/20 0930  Plan  Clinical Impression Statement Today's skilled PT session included further assessment of dynamic balance and vestibular testing due to patient reports of dizziness. Patient scored a 18/30 on FGA, demonstrating being at an increased risk for fall. Initiated HEP focused on static balance actvities and seated VOR x 1 as patient demonstrated increased difficulty with gaze stabilization activities during session. Pt will continue to benefit from skilled PT services to address deficits and progress toward all goals.  Personal Factors and Comorbidities Comorbidity 3+;Time since onset of injury/illness/exacerbation  Comorbidities BPH, prediabetes, dizziness, hypertension, hyperlipidemia, Sleep Apnea, Depression, Obesity  Examination-Activity Limitations Bend;Lift  Examination-Participation Restrictions Driving;Other (Work)  Pt will benefit from skilled therapeutic intervention in order to improve on the following deficits Abnormal gait;Decreased balance;Decreased mobility;Difficulty walking;Impaired vision/preception;Dizziness;Decreased cognition;Decreased activity tolerance;Decreased strength;Pain  Stability/Clinical Decision Making Evolving/Moderate complexity  Rehab Potential Good  PT Frequency 2x / week  PT Duration 6 weeks  PT Treatment/Interventions ADLs/Self Care Home Management;Canalith Repostioning;Cryotherapy;Moist Heat;Gait training;Stair training;Functional mobility training;Therapeutic activities;Therapeutic exercise;Balance training;Neuromuscular re-education;Patient/family education;Vestibular  PT Next Visit Plan How is HEP? Gaze stabilization exercises and balance, turns  Consulted and Agree with Plan of Care Patient        Patient will benefit from skilled therapeutic intervention in order to improve the  following deficits and impairments:     Visit Diagnosis: Unsteadiness on feet  Dizziness and giddiness  Other abnormalities of gait and mobility  Muscle weakness (generalized)     Problem List Patient Active Problem List   Diagnosis Date Noted  . Obstructive sleep apnea hypopnea, severe 03/16/2020  . 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  . Excessive daytime sleepiness 02/13/2020  . Chronic fatigue 02/13/2020  . BPH with obstruction/lower urinary tract symptoms   . Prediabetes 10/11/2018  . Former smoker 10/11/2018  . Family history of ischemic heart disease 10/11/2018  . FH: hypertension 03/23/2018  . Crushing injury of left elbow and forearm 07/13/2015  . 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 Medication management 02/08/2014  . Essential hypertension   . Hyperlipidemia, mixed   . OSA on CPAP   . Abnormal glucose   . Morbid obesity ([facility])   . Vitamin D deficiency     Tempie Donning, PT, DPT 05/11/2020, 10:57 AM  [facility] [address] [place], [place], [address] Phone: [phone]   Fax:  [phone]  Name: BREON REHM MRN: [id number] Date of Birth: 01-10-1971

## 2020-05-11 NOTE — Patient Instructions (Signed)
Access Code: [email or web address] URL: [email or web address] Date: 05/11/2020 Prepared by: Jethro Bastos  Exercises Romberg Stance with Eyes Closed - 1 x daily - 7 x weekly - 1 sets - 3 reps - 30 hold Half Tandem Stance Balance with Eyes Closed - 1 x daily - 7 x weekly - 1 sets - 3 reps - 30 hold Standing with Head Rotation - 1 x daily - 7 x weekly - 10 reps - 3 sets Seated Gaze Stabilization with Head Rotation - 1 x daily - 7 x weekly - 10 reps - 3 sets

## 2020-05-15 ENCOUNTER — Other Ambulatory Visit: Payer: Self-pay | Admitting: Internal Medicine

## 2020-05-15 DIAGNOSIS — F419 Anxiety disorder, unspecified: Secondary | ICD-10-CM

## 2020-05-16 ENCOUNTER — Other Ambulatory Visit: Payer: Self-pay

## 2020-05-16 ENCOUNTER — Ambulatory Visit: Payer: BC Managed Care – PPO

## 2020-05-16 DIAGNOSIS — M6281 Muscle weakness (generalized): Secondary | ICD-10-CM

## 2020-05-16 DIAGNOSIS — R2681 Unsteadiness on feet: Secondary | ICD-10-CM

## 2020-05-16 DIAGNOSIS — R42 Dizziness and giddiness: Secondary | ICD-10-CM

## 2020-05-16 DIAGNOSIS — R2689 Other abnormalities of gait and mobility: Secondary | ICD-10-CM

## 2020-05-16 NOTE — Patient Instructions (Signed)
Access Code: [email or web address] URL: [email or web address] Date: 05/11/2020 Prepared by: Jethro Bastos  Exercises Romberg Stance with Eyes Closed - 1 x daily - 7 x weekly - 1 sets - 3 reps - 30 hold Half Tandem Stance Balance with Eyes Closed - 1 x daily - 7 x weekly - 1 sets - 3 reps - 30 hold Standing with Head Rotation - 1 x daily - 7 x weekly - 10 reps - 3 sets Seated Gaze Stabilization with Head Rotation - 1 x daily - 7 x weekly - 10 reps - 3 sets Forward Walking with Half Turns - Slow - 1 x daily - 7 x weekly - 3 sets - 10 reps

## 2020-05-16 NOTE — Therapy (Signed)
[facility] [address] [place], [place], [address] Phone: [phone]   Fax:  [phone]  Physical Therapy Treatment  Patient Details  Name: Shane Saunders MRN: [id number] Date of Birth: [birth year] Referring Provider (PT): Joycelyn Schmid, MD   Encounter Date: 05/16/2020  PT End of Session - 05/16/20 0849    Visit Number  3    Number of Visits  13    Date for PT Re-Evaluation  07/03/20   POC for 6 weeks, Cert for 60 days   Authorization Type  BC[employer]    PT Start Time  0845    PT Stop Time  0930    PT Time Calculation (min)  45 min    Equipment Utilized During Treatment  Gait belt    Activity Tolerance  Patient tolerated treatment well    Behavior During Therapy  [facility] for tasks assessed/performed       Past Medical History:  Diagnosis Date  . Abnormal glucose   . Borderline hypertension   . BPH (benign prostatic hypertrophy)   . Crushing injury of left elbow and forearm 07/13/2015  . 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 Dizziness   . Hyperlipidemia   . Hypertension   . Hypogonadism male   . 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 Morbid obesity ([facility])   . Obesity (BMI 30-39.9)   . OSA on CPAP   . Prediabetes   . Right ureteral stone   . Sleep apnea    C PAP  . Vertigo   . Vitamin D deficiency   . Wears glasses     Past Surgical History:  Procedure Laterality Date  . 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 ARTERY REPAIR Right 05/31/2014   Procedure: IRRIGATION, EXPLORATION, AND REPAIR OF RIGHT ARM WOUND;  Surgeon: Cherylynn Ridges, MD;  Location: [facility];  Service: General;  Laterality: Right;  . CYSTOSCOPY WITH RETROGRADE PYELOGRAM, URETEROSCOPY AND STENT PLACEMENT Right 07/11/2016   Procedure: CYSTOSCOPY WITH RETROGRADE PYELOGRAM, URETEROSCOPY, BASKET STONE EXTRACTION  AND STENT  PLACEMENT;  Surgeon: Sebastian Ache, MD;  Location: [facility];  Service: Urology;  Laterality: Right;  45 MINS  C-ARM DIGITAL URETEROSCOPE HOLMIUM LASER  . ESOPHAGOGASTRODUODENOSCOPY      There were no vitals filed for this visit.  Subjective Assessment - 05/16/20 0847    Subjective  Reports no changes since evaluation. Headaches continues to stay the same. Dizziness is coming and going. Reports has been completing HEP with reports of increasing dizziness. No falls.    Pertinent History  BPH, prediabetes, dizziness, hypertension, hyperlipidemia, Sleep Apnea, Depression, Obesity    Limitations  Walking    Diagnostic tests  Awaiting result of MRI    Patient Stated Goals  Be able to do all work tasks without symptoms.    Currently in Pain?  No/denies    Pain Onset  More than a month ago                        [facility] Adult PT Treatment/Exercise - 05/16/20 0935      Transfers   Transfers  Sit to Stand;Stand to Sit    Sit to Stand  6: Modified independent (Device/Increase time)    Stand to Sit  6: Modified independent (Device/Increase time)      Ambulation/Gait  Ambulation/Gait  Yes    Ambulation/Gait Assistance  5: Supervision    Ambulation/Gait Assistance Details  throughout therapy gym during session    Ambulation Distance (Feet)  200 Feet    Assistive device  None    Gait Pattern  Step-through pattern;Wide base of support    Ambulation Surface  Level;Indoor      Vestibular Treatment/Exercise - 05/16/20 0935      Vestibular Treatment/Exercise   Vestibular Treatment Provided  Gaze    Habituation Exercises  180 degree Turns;360 degree Turns    Gaze Exercises  X1 Viewing Horizontal      180 degree Turns   Number of Reps   6    Symptom Description   mild dizziness with completion; incorporated walking (4-5 steps) prior to turn. Gradually increased speed of movement with pt reporting increased symptoms with completion      360 degree Turns    Number of Reps   4    Symptom Description   moderate dizziness with 360 deg turns    COMMENT  pt reports full 360 deg turns provokes symptoms more than 180 deg turns in today's session      X1 Viewing Horizontal   Foot Position  seated on mat; standing feet apart    Reps  --   2 x 10 reps each   Comments  educated on completing up to 3-4/10 dizziness and then stopping to let resolution of symptoms to allow for improvement. Pt continues to demo dififculty with standing VOR with posterior sway/LOB         Balance Exercises - 05/16/20 0939      Balance Exercises: Standing   Standing Eyes Opened  Wide (BOA);Head turns;Foam/compliant surface;Other reps (comment)   2 x x 10 reps   Standing Eyes Closed  Narrow base of support (BOS);Foam/compliant surface;4 reps;30 secs    Rockerboard  Anterior/posterior;EO;EC;30 seconds;Other reps (comment);Limitations    Rockerboard Limitations  x 2 reps w/ EO, x 4 reps w/ EC. Increased sway noted with EC.     Other Standing Exercises  All balance exercises completed in // bars with intermittent UE support for steadying as needed, and CGA from PT.         PT Education - 05/16/20 0934    Education Details  Updated HEP (see patient instruction; new addition highlighted)    Person(s) Educated  Patient    Methods  Explanation;Demonstration;Handout    Comprehension  Returned demonstration;Verbalized understanding       PT Short Term Goals - 05/04/20 0951      PT SHORT TERM GOAL #1   Title  Patient will be independent with initial HEP    Time  3    Period  Weeks    Status  New    Target Date  05/25/20      PT SHORT TERM GOAL #2   Title  FGA to be assessed and LTG set as appropriate    Time  3    Period  Weeks    Status  New    Target Date  05/25/20        PT Long Term Goals - 05/04/20 1008      PT LONG TERM GOAL #1   Title  Patient will be independent with final HEP    Time  6    Period  Weeks    Status  New    Target Date  06/15/20       PT LONG TERM GOAL #  2   Title  Patient will improve gait speed >/= 3.5 ft/sec to demonstrate improved mobility    Baseline  2.98 ft/sec    Time  6    Period  Weeks    Status  New    Target Date  06/15/20      PT LONG TERM GOAL #3   Title  Patient will completed TUG in </= 10 secs to demonstrate reduced risk for falls.    Baseline  11.91    Time  6    Period  Weeks    Status  New    Target Date  06/15/20      PT LONG TERM GOAL #4   Title  Pt will report at least 50% improvement in symptoms regarding dizziness and headaches    Time  6    Period  Weeks    Status  New    Target Date  06/15/20      PT LONG TERM GOAL #5   Title  Patient will improve FGA by 5 points from baseline to demonstrate improved balance and reduced risk for falls    Time  6    Period  Weeks    Status  New    Target Date  06/15/20            Plan - 05/16/20 0941    Clinical Impression Statement  Continued vestibular treatment and balance exercises in today's session. Pt continues to demonstrate difficulty with standing VOR x 1, with increased posterior sway. Incorporated 180/360 degree turns with gait actvitity, patient demonstrating increased symptoms with full body turns. PT educating patient to not allow dizziness to get above 3-4/10 to allow for [place] to gently reach threshold and then to allow symptoms to decrease prior to completion of next set. Pt will continue to benefit from skilled PT services.    Personal Factors and Comorbidities  Comorbidity 3+;Time since onset of injury/illness/exacerbation    Comorbidities  BPH, prediabetes, dizziness, hypertension, hyperlipidemia, Sleep Apnea, Depression, Obesity    Examination-Activity Limitations  Bend;Lift    Examination-Participation Restrictions  Driving;Other   Work   [employer]  Evolving/Moderate complexity    Rehab Potential  Good    PT Frequency  2x / week    PT Duration  6 weeks    PT Treatment/Interventions   ADLs/Self Care Home Management;Canalith Repostioning;Cryotherapy;Moist Heat;Gait training;Stair training;Functional mobility training;Therapeutic activities;Therapeutic exercise;Balance training;Neuromuscular re-education;Patient/family education;Vestibular    PT Next Visit Plan  How is walking turns? Complete SOT    Consulted and Agree with Plan of Care  Patient       Patient will benefit from skilled therapeutic intervention in order to improve the following deficits and impairments:  Abnormal gait, Decreased balance, Decreased mobility, Difficulty walking, Impaired vision/preception, Dizziness, Decreased cognition, Decreased activity tolerance, Decreased strength, Pain  Visit Diagnosis: Unsteadiness on feet  Dizziness and giddiness  Other abnormalities of gait and mobility  Muscle weakness (generalized)     Problem List Patient Active Problem List   Diagnosis Date Noted  . Obstructive sleep apnea hypopnea, severe 03/16/2020  . 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  . Excessive daytime sleepiness 02/13/2020  . Chronic fatigue 02/13/2020  . BPH with obstruction/lower urinary tract symptoms   . Prediabetes 10/11/2018  . Former smoker 10/11/2018  . Family history of ischemic heart disease 10/11/2018  . FH: hypertension 03/23/2018  . Crushing injury of left elbow and forearm 07/13/2015  . Depression, major, in partial remission ([place]) 07/12/2015  . Medication management 02/08/2014  .  Essential hypertension   . Hyperlipidemia, mixed   . OSA on CPAP   . Abnormal glucose   . Morbid obesity ([facility])   . Vitamin D deficiency     Tempie Donning, PT, DPT 05/16/2020, 9:48 AM  [facility] [address] [place], [place], [address] Phone: [phone]   Fax:  [phone]  Name: Shane Saunders MRN: [id number] Date of Birth: 02-Mar-1971

## 2020-05-18 ENCOUNTER — Ambulatory Visit: Payer: BC Managed Care – PPO

## 2020-05-22 ENCOUNTER — Telehealth: Payer: Self-pay | Admitting: *Deleted

## 2020-05-22 NOTE — Telephone Encounter (Signed)
Spoke with patient and informed him his MRI cervical and lumbar spines are unremarkable. He stated he saw on my chart something about a bulging disks. I advised him I was reading MRI results and there was no mention in either scan of bulging disks. Patient verbalized understanding, appreciation.

## 2020-05-23 ENCOUNTER — Ambulatory Visit: Payer: BC Managed Care – PPO

## 2020-05-23 ENCOUNTER — Other Ambulatory Visit: Payer: Self-pay

## 2020-05-23 DIAGNOSIS — R2681 Unsteadiness on feet: Secondary | ICD-10-CM | POA: Diagnosis not present

## 2020-05-23 DIAGNOSIS — M6281 Muscle weakness (generalized): Secondary | ICD-10-CM

## 2020-05-23 DIAGNOSIS — R2689 Other abnormalities of gait and mobility: Secondary | ICD-10-CM

## 2020-05-23 DIAGNOSIS — R42 Dizziness and giddiness: Secondary | ICD-10-CM

## 2020-05-23 NOTE — Therapy (Signed)
[facility] [address] [place], [place], [address] Phone: [phone]   Fax:  [phone]  Physical Therapy Treatment  Patient Details  Name: Shane Saunders MRN: [id number] Date of Birth: [birth year] Referring Provider (PT): Joycelyn Schmid, MD   Encounter Date: 05/23/2020  PT End of Session - 05/23/20 0845    Visit Number  4    Number of Visits  13    Date for PT Re-Evaluation  07/03/20   POC for 6 weeks, Cert for 60 days   Authorization Type  [employer]    PT Start Time  0845    PT Stop Time  0932    PT Time Calculation (min)  47 min    Equipment Utilized During Treatment  Gait belt    Activity Tolerance  Patient tolerated treatment well    Behavior During Therapy  [facility] for tasks assessed/performed       Past Medical History:  Diagnosis Date  . Abnormal glucose   . Borderline hypertension   . BPH (benign prostatic hypertrophy)   . Crushing injury of left elbow and forearm 07/13/2015  . 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 Dizziness   . Hyperlipidemia   . Hypertension   . Hypogonadism male   . 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 Morbid obesity (HCC)   . Obesity (BMI 30-39.9)   . OSA on CPAP   . Prediabetes   . Right ureteral stone   . Sleep apnea    C PAP  . Vertigo   . Vitamin D deficiency   . Wears glasses     Past Surgical History:  Procedure Laterality Date  . 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 ARTERY REPAIR Right 05/31/2014   Procedure: IRRIGATION, EXPLORATION, AND REPAIR OF RIGHT ARM WOUND;  Surgeon: Cherylynn Ridges, MD;  Location: [facility] OR;  Service: General;  Laterality: Right;  . CYSTOSCOPY WITH RETROGRADE PYELOGRAM, URETEROSCOPY AND STENT PLACEMENT Right 07/11/2016   Procedure: CYSTOSCOPY WITH RETROGRADE PYELOGRAM, URETEROSCOPY, BASKET STONE EXTRACTION  AND STENT  PLACEMENT;  Surgeon: Sebastian Ache, MD;  Location: [facility];  Service: Urology;  Laterality: Right;  45 MINS  C-ARM DIGITAL URETEROSCOPE HOLMIUM LASER  . ESOPHAGOGASTRODUODENOSCOPY      There were no vitals filed for this visit.  Subjective Assessment - 05/23/20 0845    Subjective  Pt reports that dizziness is about the same. He has been doing the exercises and does note some increase in dizziness as goes on with VOR. Pt got results back on MRI and results were negative. Pt reports that walking and turning he still notices the dizziness with not being able to find his balance.    Pertinent History  BPH, prediabetes, dizziness, hypertension, hyperlipidemia, Sleep Apnea, Depression, Obesity    Limitations  Walking    Diagnostic tests  Awaiting result of MRI    Patient Stated Goals  Be able to do all work tasks without symptoms.    Currently in Pain?  No/denies    Pain Onset  More than a month ago                        [facility] Adult PT Treatment/Exercise - 05/23/20 0847      Ambulation/Gait   Ambulation/Gait  Yes    Ambulation/Gait Assistance  5:  Supervision    Ambulation/Gait Assistance Details  around in clinic during session    Assistive device  None    Gait Pattern  Step-through pattern;Wide base of support    Ambulation Surface  Level;Indoor      High Level Balance   High Level Balance Comments  Performed SOT on balance master: Condition 1 pt in red for 2/3, condition 2 pt in red all 3, condition 3 pt in red all 3, condition 4 in red on 1st tial and then green second 2 showing improvement with practice for somatosensory, condition 5 in green 1st and 3rd trials and red 2nd trial, condition 6 "fell" each time. Pt with visual preference overall with poor vestibular component but also struggled with somatosensory. Pt had heavy hip strategy and COM shifted posterior.      Neuro Re-ed    Neuro Re-ed Details   Standing at bottom of stairs on airex: feet  apart eyes open x 30 sec x 2 then eyes closed x 30 sec. Pt loses balance posterior with eyes closed needing min assist to regain. Pt showing poor ankle strategy to compensate. Standing on airex alternating toe taps on bottom step without UE support CGA with decreased stability. Standing by wall with feet about 4" away leaning back in to wall and using ankles to pull him back upright x 10.      Vestibular Treatment/Exercise - 05/23/20 0912      Vestibular Treatment/Exercise   Gaze Exercises  X1 Viewing Horizontal      X1 Viewing Horizontal   Foot Position  seated on mat; standing feet apart    Comments  30 sec x 3, 1/10 dizziness first bout, 1.5/10 second bout,  1.5/10 3rd for sitting. Then repeated 30 sec x 3 in standing. Pt reports dizziness 2/10. Pt does lose balance posterior fairly quickly in standing CGA.            PT Education - 05/23/20 1659    Education Details  Educated on results of SOT testing. Added wall bumps to work on ankle strategy.    Person(s) Educated  Patient    Methods  Explanation;Demonstration;Handout    Comprehension  Verbalized understanding;Returned demonstration       PT Short Term Goals - 05/04/20 0951      PT SHORT TERM GOAL #1   Title  Patient will be independent with initial HEP    Time  3    Period  Weeks    Status  New    Target Date  05/25/20      PT SHORT TERM GOAL #2   Title  FGA to be assessed and LTG set as appropriate    Time  3    Period  Weeks    Status  New    Target Date  05/25/20        PT Long Term Goals - 05/04/20 1008      PT LONG TERM GOAL #1   Title  Patient will be independent with final HEP    Time  6    Period  Weeks    Status  New    Target Date  06/15/20      PT LONG TERM GOAL #2   Title  Patient will improve gait speed >/= 3.5 ft/sec to demonstrate improved mobility    Baseline  2.98 ft/sec    Time  6    Period  Weeks    Status  New    Target Date  06/15/20      PT LONG TERM GOAL #3   Title   Patient will completed TUG in </= 10 secs to demonstrate reduced risk for falls.    Baseline  11.91    Time  6    Period  Weeks    Status  New    Target Date  06/15/20      PT LONG TERM GOAL #4   Title  Pt will report at least 50% improvement in symptoms regarding dizziness and headaches    Time  6    Period  Weeks    Status  New    Target Date  06/15/20      PT LONG TERM GOAL #5   Title  Patient will improve FGA by 5 points from baseline to demonstrate improved balance and reduced risk for falls    Time  6    Period  Weeks    Status  New    Target Date  06/15/20            Plan - 05/23/20 1700    Clinical Impression Statement  Pt with low level dizziness with VOR x 1 horizontal viewing. Decreased stability in standing with LOB posterior each time. PT completed SOT testing today. Pt had difficulty with all conditions but vestibular being most impaired. He has poor ankle strategy and relies most on hip strategy. Tends to keep weight shifted back on heels.    Personal Factors and Comorbidities  Comorbidity 3+;Time since onset of injury/illness/exacerbation    Comorbidities  BPH, prediabetes, dizziness, hypertension, hyperlipidemia, Sleep Apnea, Depression, Obesity    Examination-Activity Limitations  Bend;Lift    Examination-Participation Restrictions  Driving;Other   Work   Conservation officer, historic buildings  Evolving/Moderate complexity    Rehab Potential  Good    PT Frequency  2x / week    PT Duration  6 weeks    PT Treatment/Interventions  ADLs/Self Care Home Management;Canalith Repostioning;Cryotherapy;Moist Heat;Gait training;Stair training;Functional mobility training;Therapeutic activities;Therapeutic exercise;Balance training;Neuromuscular re-education;Patient/family education;Vestibular    PT Next Visit Plan  Continue working on walking with turns, ankle strategy for compensation. Pt was impaired on all systems on SOT testing but fell each time when vestibular system  needed.    Consulted and Agree with Plan of Care  Patient       Patient will benefit from skilled therapeutic intervention in order to improve the following deficits and impairments:  Abnormal gait, Decreased balance, Decreased mobility, Difficulty walking, Impaired vision/preception, Dizziness, Decreased cognition, Decreased activity tolerance, Decreased strength, Pain  Visit Diagnosis: Other abnormalities of gait and mobility  Muscle weakness (generalized)  Dizziness and giddiness     Problem List Patient Active Problem List   Diagnosis Date Noted  . Obstructive sleep apnea hypopnea, severe 03/16/2020  . 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  . Excessive daytime sleepiness 02/13/2020  . Chronic fatigue 02/13/2020  . BPH with obstruction/lower urinary tract symptoms   . Prediabetes 10/11/2018  . Former smoker 10/11/2018  . Family history of ischemic heart disease 10/11/2018  . FH: hypertension 03/23/2018  . Crushing injury of left elbow and forearm 07/13/2015  . 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 Medication management 02/08/2014  . Essential hypertension   . Hyperlipidemia, mixed   . OSA on CPAP   . Abnormal glucose   . Morbid obesity (HCC)   . Vitamin D deficiency     Ronn Melena, PT, DPT, NCS 05/23/2020, 5:06 PM  [facility] [address]  [place], [place], [address] Phone: [phone]   Fax:  [phone]  Name: Shane Saunders MRN: [id number] Date of Birth: 22-Jul-1971

## 2020-05-23 NOTE — Patient Instructions (Signed)
Access Code: [email or web address] URL: [email or web address] Date: 05/23/2020 Prepared by: Elmer Bales  Exercises Romberg Stance with Eyes Closed - 1 x daily - 7 x weekly - 1 sets - 3 reps - 30 hold Half Tandem Stance Balance with Eyes Closed - 1 x daily - 7 x weekly - 1 sets - 3 reps - 30 hold Standing with Head Rotation - 1 x daily - 7 x weekly - 10 reps - 3 sets Seated Gaze Stabilization with Head Rotation - 1 x daily - 7 x weekly - 10 reps - 3 sets Forward Walking with Half Turns - Slow - 1 x daily - 7 x weekly - 3 sets - 10 reps Wall bumps with ankles - 1 x daily - 7 x weekly - 3 sets - 10 reps

## 2020-05-25 ENCOUNTER — Ambulatory Visit: Payer: BC Managed Care – PPO

## 2020-05-25 ENCOUNTER — Other Ambulatory Visit: Payer: Self-pay

## 2020-05-25 DIAGNOSIS — M6281 Muscle weakness (generalized): Secondary | ICD-10-CM

## 2020-05-25 DIAGNOSIS — R42 Dizziness and giddiness: Secondary | ICD-10-CM

## 2020-05-25 DIAGNOSIS — R2681 Unsteadiness on feet: Secondary | ICD-10-CM

## 2020-05-25 DIAGNOSIS — R2689 Other abnormalities of gait and mobility: Secondary | ICD-10-CM

## 2020-05-25 NOTE — Therapy (Signed)
[facility] [address] [place], [place], [address] Phone: [phone]   Fax:  [phone]  Physical Therapy Treatment  Patient Details  Name: Shane Saunders MRN: [id number] Date of Birth: 1971-02-23 Referring Provider (PT): Joycelyn Schmid, MD   Encounter Date: 05/25/2020  PT End of Session - 05/25/20 1041    Visit Number  5    Number of Visits  13    Date for PT Re-Evaluation  07/03/20   POC for 6 weeks, Cert for 60 days   Authorization Type  [employer]    PT Start Time  [phone]    PT Stop Time  0929    PT Time Calculation (min)  43 min    Equipment Utilized During Treatment  Gait belt    Activity Tolerance  Patient tolerated treatment well    Behavior During Therapy  [facility] for tasks assessed/performed       Past Medical History:  Diagnosis Date  . Abnormal glucose   . Borderline hypertension   . BPH (benign prostatic hypertrophy)   . Crushing injury of left elbow and forearm 07/13/2015  . 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 Dizziness   . Hyperlipidemia   . Hypertension   . Hypogonadism male   . 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 Morbid obesity (HCC)   . Obesity (BMI 30-39.9)   . OSA on CPAP   . Prediabetes   . Right ureteral stone   . Sleep apnea    C PAP  . Vertigo   . Vitamin D deficiency   . Wears glasses     Past Surgical History:  Procedure Laterality Date  . 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 ARTERY REPAIR Right 05/31/2014   Procedure: IRRIGATION, EXPLORATION, AND REPAIR OF RIGHT ARM WOUND;  Surgeon: Cherylynn Ridges, MD;  Location: [facility] OR;  Service: General;  Laterality: Right;  . CYSTOSCOPY WITH RETROGRADE PYELOGRAM, URETEROSCOPY AND STENT PLACEMENT Right 07/11/2016   Procedure: CYSTOSCOPY WITH RETROGRADE PYELOGRAM, URETEROSCOPY, BASKET STONE EXTRACTION  AND STENT  PLACEMENT;  Surgeon: Sebastian Ache, MD;  Location[facility];  Service: Urology;  Laterality: Right;  45 MINS  C-ARM DIGITAL URETEROSCOPE HOLMIUM LASER  . ESOPHAGOGASTRODUODENOSCOPY      There were no vitals filed for this visit.  Subjective Assessment - 05/25/20 0847    Subjective  Patient reports slight dizziness this morning when first got up. No pain. Still reports headache.    Pertinent History  BPH, prediabetes, dizziness, hypertension, hyperlipidemia, Sleep Apnea, Depression, Obesity    Diagnostic tests  results of MRI: cervical and lumbar spine unremarkable    Currently in Pain?  No/denies                        [facility] Adult PT Treatment/Exercise - 05/25/20 0929      Ambulation/Gait   Ambulation/Gait  Yes    Ambulation/Gait Assistance  5: Supervision    Ambulation/Gait Assistance Details  around therapy gym during session    Assistive device  None    Gait Pattern  Step-through pattern;Wide base of support    Ambulation Surface  Level;Indoor      High Level Balance   High Level Balance Activities  Head turns    High Level Balance Comments  Completed forward gait with head turns,  horizontal x 3 laps, vertical x 2 laps. at end of completion patient reporting increased symptoms, unable to verbalize/determine if horizontal or vertical increased symptoms.       Vestibular Treatment/Exercise - 05/25/20 0001      Vestibular Treatment/Exercise   Vestibular Treatment Provided  Habituation    Habituation Exercises  Standing Diagonal Head Turns;360 degree Turns      Standing Diagonal Head Turns   Number of Reps   5    Symptiom Description   x 5 laps, of standing diagonal head tursn with ball, pt tracking ball with eyes.       180 degree Turns   Number of Reps   5    Symptom Description   mild dizziness with completion, 4-5 steps between turns. Medium speed with completion      360 degree Turns   Number of Reps   4    Symptom Description    Moderate dizziness, 4-5 steps between turns. Increased dizziness and unsteadiness with completion.          Balance Exercises - 05/25/20 0906      Balance Exercises: Standing   Standing Eyes Opened  Wide (BOA);Head turns;3 reps;30 secs;Limitations    Standing Eyes Opened Limitations  completed horizontal and vertical head turns 2 x 10 reps, increased posterior sway    Standing Eyes Closed  Narrow base of support (BOS);Foam/compliant surface;3 reps;30 secs;Limitations    Standing Eyes Closed Limitations  CGA, intermittent UE support from countertop    Rockerboard  Anterior/posterior;EO;Intermittent UE support    Rockerboard Limitations  2 x 1 min, with focus on holding steady. Patient demo keeping weight posterior.     Other Standing Exercises  standing on foam, completed ant/post weight shift to promote ankle strategies. Pt demo increased weight posterior.     Other Standing Exercises Comments  Also completed forward bending over to pick up weighted ball and back x 10 reps. Mild increase in dizziness.           PT Short Term Goals - 05/04/20 0951      PT SHORT TERM GOAL #1   Title  Patient will be independent with initial HEP    Time  3    Period  Weeks    Status  New    Target Date  05/25/20      PT SHORT TERM GOAL #2   Title  FGA to be assessed and LTG set as appropriate    Time  3    Period  Weeks    Status  New    Target Date  05/25/20        PT Long Term Goals - 05/04/20 1008      PT LONG TERM GOAL #1   Title  Patient will be independent with final HEP    Time  6    Period  Weeks    Status  New    Target Date  06/15/20      PT LONG TERM GOAL #2   Title  Patient will improve gait speed >/= 3.5 ft/sec to demonstrate improved mobility    Baseline  2.98 ft/sec    Time  6    Period  Weeks    Status  New    Target Date  06/15/20      PT LONG TERM GOAL #3   Title  Patient will completed TUG in </= 10 secs to demonstrate reduced risk for falls.    Baseline   11.91  Time  6    Period  Weeks    Status  New    Target Date  06/15/20      PT LONG TERM GOAL #4   Title  Pt will report at least 50% improvement in symptoms regarding dizziness and headaches    Time  6    Period  Weeks    Status  New    Target Date  06/15/20      PT LONG TERM GOAL #5   Title  Patient will improve FGA by 5 points from baseline to demonstrate improved balance and reduced risk for falls    Time  6    Period  Weeks    Status  New    Target Date  06/15/20            Plan - 05/25/20 1043    Clinical Impression Statement  Today's skilled PT session included further balance and vestibular treatment. Primarily focused on targeting ankle strategies, and habituation training. Patient still demo poor ankle strategy and increased dizziness with 360 deg turns. Pt will continue to benefit from skilled PT services to address deficits.    Personal Factors and Comorbidities  Comorbidity 3+;Time since onset of injury/illness/exacerbation    Comorbidities  BPH, prediabetes, dizziness, hypertension, hyperlipidemia, Sleep Apnea, Depression, Obesity    Examination-Activity Limitations  Bend;Lift    Examination-Participation Restrictions  Driving;Other   Work   Conservation officer, historic buildings  Evolving/Moderate complexity    Rehab Potential  Good    PT Frequency  2x / week    PT Duration  6 weeks    PT Treatment/Interventions  ADLs/Self Care Home Management;Canalith Repostioning;Cryotherapy;Moist Heat;Gait training;Stair training;Functional mobility training;Therapeutic activities;Therapeutic exercise;Balance training;Neuromuscular re-education;Patient/family education;Vestibular    PT Next Visit Plan  walking with turns, continue targeting ankle strategy for compensation. Update HEP?    Consulted and Agree with Plan of Care  Patient       Patient will benefit from skilled therapeutic intervention in order to improve the following deficits and impairments:  Abnormal gait,  Decreased balance, Decreased mobility, Difficulty walking, Impaired vision/preception, Dizziness, Decreased cognition, Decreased activity tolerance, Decreased strength, Pain  Visit Diagnosis: Other abnormalities of gait and mobility  Muscle weakness (generalized)  Dizziness and giddiness  Unsteadiness on feet     Problem List Patient Active Problem List   Diagnosis Date Noted  . Obstructive sleep apnea hypopnea, severe 03/16/2020  . 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  . Excessive daytime sleepiness 02/13/2020  . Chronic fatigue 02/13/2020  . BPH with obstruction/lower urinary tract symptoms   . Prediabetes 10/11/2018  . Former smoker 10/11/2018  . Family history of ischemic heart disease 10/11/2018  . FH: hypertension 03/23/2018  . Crushing injury of left elbow and forearm 07/13/2015  . 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 Medication management 02/08/2014  . Essential hypertension   . Hyperlipidemia, mixed   . OSA on CPAP   . Abnormal glucose   . Morbid obesity (HCC)   . Vitamin D deficiency     Tempie Donning, PT, DPT 05/25/2020, 10:51 AM  [facility] [address] [place], [place], [address] Phone: [phone]   Fax:  [phone]  Name: KEDDRICK WYNE MRN: [id number] Date of Birth: [birth year]

## 2020-05-30 ENCOUNTER — Other Ambulatory Visit: Payer: Self-pay

## 2020-05-30 ENCOUNTER — Ambulatory Visit: Payer: BC Managed Care – PPO | Attending: Diagnostic Neuroimaging

## 2020-05-30 DIAGNOSIS — R42 Dizziness and giddiness: Secondary | ICD-10-CM | POA: Diagnosis present

## 2020-05-30 DIAGNOSIS — M542 Cervicalgia: Secondary | ICD-10-CM | POA: Insufficient documentation

## 2020-05-30 DIAGNOSIS — R2681 Unsteadiness on feet: Secondary | ICD-10-CM | POA: Diagnosis present

## 2020-05-30 DIAGNOSIS — M6281 Muscle weakness (generalized): Secondary | ICD-10-CM

## 2020-05-30 DIAGNOSIS — R2689 Other abnormalities of gait and mobility: Secondary | ICD-10-CM

## 2020-05-30 NOTE — Therapy (Signed)
[facility] [address] [place], [place], [address] Phone: [phone]   Fax:  [phone]  Physical Therapy Treatment  Patient Details  Name: Shane Saunders MRN: [id number] Date of Birth: [birth year] Referring Provider (PT): Joycelyn Schmid, MD   Encounter Date: 05/30/2020  PT End of Session - 05/30/20 0853    Visit Number  6    Number of Visits  13    Date for PT Re-Evaluation  07/03/20   POC for 6 weeks, Cert for 60 days   Authorization Type  [employer]    PT Start Time  [phone]    PT Stop Time  0931    PT Time Calculation (min)  44 min    Equipment Utilized During Treatment  Gait belt    Activity Tolerance  Patient tolerated treatment well    Behavior During Therapy  [facility] for tasks assessed/performed       Past Medical History:  Diagnosis Date  . Abnormal glucose   . Borderline hypertension   . BPH (benign prostatic hypertrophy)   . Crushing injury of left elbow and forearm 07/13/2015  . 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 Dizziness   . Hyperlipidemia   . Hypertension   . Hypogonadism male   . 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 Morbid obesity (HCC)   . Obesity (BMI 30-39.9)   . OSA on CPAP   . Prediabetes   . Right ureteral stone   . Sleep apnea    C PAP  . Vertigo   . Vitamin D deficiency   . Wears glasses     Past Surgical History:  Procedure Laterality Date  . 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 ARTERY REPAIR Right 05/31/2014   Procedure: IRRIGATION, EXPLORATION, AND REPAIR OF RIGHT ARM WOUND;  Surgeon: Cherylynn Ridges, MD;  Location: [facility];  Service: General;  Laterality: Right;  . CYSTOSCOPY WITH RETROGRADE PYELOGRAM, URETEROSCOPY AND STENT PLACEMENT Right 07/11/2016   Procedure: CYSTOSCOPY WITH RETROGRADE PYELOGRAM, URETEROSCOPY, BASKET STONE EXTRACTION  AND STENT  PLACEMENT;  Surgeon: Sebastian Ache, MD;  Location: [facility];  Service: Urology;  Laterality: Right;  45 MINS  C-ARM DIGITAL URETEROSCOPE HOLMIUM LASER  . ESOPHAGOGASTRODUODENOSCOPY      There were no vitals filed for this visit.  Subjective Assessment - 05/30/20 0848    Subjective  Patient reports that the headaches has been worse over the past couple days. Reports dizziness 1/10.    Pertinent History  BPH, prediabetes, dizziness, hypertension, hyperlipidemia, Sleep Apnea, Depression, Obesity    Diagnostic tests  results of MRI: cervical and lumbar spine unremarkable    Currently in Pain?  No/denies                        [facility] Adult PT Treatment/Exercise - 05/30/20 0001      Ambulation/Gait   Ambulation/Gait  Yes    Ambulation/Gait Assistance  5: Supervision    Ambulation/Gait Assistance Details  around therapy gym during session    Assistive device  None    Gait Pattern  Step-through pattern;Wide base of support    Ambulation Surface  Level;Indoor      Vestibular Treatment/Exercise - 05/30/20 2409      Vestibular Treatment/Exercise   Vestibular Treatment Provided  Habituation;Gaze    Gaze Exercises  X1 Viewing Horizontal;X2 Viewing Horizontal      360 degree Turns   Number of Reps   3    Symptom Description   Moderate dizziness, 4-5 steps between turns. Pt reporitng increased dizziness with completion, requiring seated rest break.       X1 Viewing Horizontal   Foot Position  standing w/o support, feet apart    Reps  3    Comments  3 x 1 min each, posterior LOB/sway. Rest breaks between sets to allow for decreased symptom severity.       X2 Viewing Horizontal   Foot Position  seated w/o back support    Reps  2     Comments  x 1 min. patient reporting increased difficulty with focusing. blurriness reported on going to L side.          Balance Exercises - 05/30/20 0910      Balance Exercises: Standing   Balance Beam  Completed  static standing on balance beam w/ EO, 3 x 1 min holding steady, increased posterior sway. Intermittent UE support required due to unsteadiness. Completed alternating fwd/back taps to further promote ankle strategies, 2 x 10 reps. CGA throughout.         PT Education - 05/30/20 1050    Education Details  Educated to complete VOR x 1 in standing. Completion in the corner for safety due to posterior sway.    Person(s) Educated  Patient    Methods  Explanation;Demonstration    Comprehension  Verbalized understanding       PT Short Term Goals - 05/30/20 1047      PT SHORT TERM GOAL #1   Title  Patient will be independent with initial HEP    Baseline  Pt verbalizes indepedence    Time  3    Period  Weeks    Status  Achieved    Target Date  05/25/20      PT SHORT TERM GOAL #2   Title  FGA to be assessed and LTG set as appropriate    Baseline  LTG written.    Time  3    Period  Weeks    Status  Achieved    Target Date  05/25/20        PT Long Term Goals - 05/04/20 1008      PT LONG TERM GOAL #1   Title  Patient will be independent with final HEP    Time  6    Period  Weeks    Status  New    Target Date  06/15/20      PT LONG TERM GOAL #2   Title  Patient will improve gait speed >/= 3.5 ft/sec to demonstrate improved mobility    Baseline  2.98 ft/sec    Time  6    Period  Weeks    Status  New    Target Date  06/15/20      PT LONG TERM GOAL #3   Title  Patient will completed TUG in </= 10 secs to demonstrate reduced risk for falls.    Baseline  11.91    Time  6    Period  Weeks    Status  New    Target Date  06/15/20      PT LONG TERM GOAL #4   Title  Pt will report at least 50% improvement in symptoms regarding dizziness and headaches    Time  6    Period  Weeks  Status  New    Target Date  06/15/20      PT LONG TERM GOAL #5   Title  Patient will improve FGA by 5 points from baseline to demonstrate improved balance and reduced risk for falls    Time  6     Period  Weeks    Status  New    Target Date  06/15/20            Plan - 05/30/20 1044    Clinical Impression Statement  Today's skilled PT sesion included continued balance training focused on improving ankle strategies, and gaze stability and habituation training. Pt demo increased dizziness with habituation exercises today, requiring increased rest breaks between completion of activties. Progressed to VOR x 2 in seated, due to low level dizziness with VOR x 1 in seated. Continue to complete VOR x 1 in standing with posterior sway. Assessed STG's today, with meeting all.  Pt will continue to benefit from skilled PT services to progress toward all unmet goals.    Personal Factors and Comorbidities  Comorbidity 3+;Time since onset of injury/illness/exacerbation    Comorbidities  BPH, prediabetes, dizziness, hypertension, hyperlipidemia, Sleep Apnea, Depression, Obesity    Examination-Activity Limitations  Bend;Lift    Examination-Participation Restrictions  Driving;Other   Work   Conservation officer, historic buildings  Evolving/Moderate complexity    Rehab Potential  Good    PT Frequency  2x / week    PT Duration  6 weeks    PT Treatment/Interventions  ADLs/Self Care Home Management;Canalith Repostioning;Cryotherapy;Moist Heat;Gait training;Stair training;Functional mobility training;Therapeutic activities;Therapeutic exercise;Balance training;Neuromuscular re-education;Patient/family education;Vestibular    PT Next Visit Plan  How was standing VOR? continue balance training, habituation training.    Consulted and Agree with Plan of Care  Patient       Patient will benefit from skilled therapeutic intervention in order to improve the following deficits and impairments:  Abnormal gait, Decreased balance, Decreased mobility, Difficulty walking, Impaired vision/preception, Dizziness, Decreased cognition, Decreased activity tolerance, Decreased strength, Pain  Visit Diagnosis: Other  abnormalities of gait and mobility  Unsteadiness on feet  Muscle weakness (generalized)  Dizziness and giddiness     Problem List Patient Active Problem List   Diagnosis Date Noted  . Obstructive sleep apnea hypopnea, severe 03/16/2020  . 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  . Excessive daytime sleepiness 02/13/2020  . Chronic fatigue 02/13/2020  . BPH with obstruction/lower urinary tract symptoms   . Prediabetes 10/11/2018  . Former smoker 10/11/2018  . Family history of ischemic heart disease 10/11/2018  . FH: hypertension 03/23/2018  . Crushing injury of left elbow and forearm 07/13/2015  . 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 Medication management 02/08/2014  . Essential hypertension   . Hyperlipidemia, mixed   . OSA on CPAP   . Abnormal glucose   . Morbid obesity (HCC)   . Vitamin D deficiency     Tempie Donning, PT, DPT 05/30/2020, 10:50 AM  [facility] [address] [place], [place], [address] Phone: [phone]   Fax:  [phone]  Name: Shane Saunders MRN: [id number] Date of Birth: Apr 28, 1971

## 2020-06-01 ENCOUNTER — Ambulatory Visit: Payer: BC Managed Care – PPO

## 2020-06-01 ENCOUNTER — Other Ambulatory Visit: Payer: Self-pay

## 2020-06-01 DIAGNOSIS — R42 Dizziness and giddiness: Secondary | ICD-10-CM

## 2020-06-01 DIAGNOSIS — R2681 Unsteadiness on feet: Secondary | ICD-10-CM

## 2020-06-01 DIAGNOSIS — R2689 Other abnormalities of gait and mobility: Secondary | ICD-10-CM | POA: Diagnosis not present

## 2020-06-01 DIAGNOSIS — M6281 Muscle weakness (generalized): Secondary | ICD-10-CM

## 2020-06-01 NOTE — Therapy (Signed)
[facility] [address] [place], [place], [address] Phone: [phone]   Fax:  [phone]  Physical Therapy Treatment  Patient Details  Name: Shane Saunders MRN: [id number] Date of Birth: 04-Jan-1971 Referring Provider (PT): Joycelyn Schmid, MD   Encounter Date: 06/01/2020  PT End of Session - 06/01/20 0854    Visit Number  7    Number of Visits  13    Date for PT Re-Evaluation  07/03/20   POC for 6 weeks, Cert for 60 days   Authorization Type  [employer]    PT Start Time  [phone]    PT Stop Time  0930    PT Time Calculation (min)  44 min    Equipment Utilized During Treatment  Gait belt    Activity Tolerance  Patient tolerated treatment well    Behavior During Therapy  [facility] for tasks assessed/performed       Past Medical History:  Diagnosis Date  . Abnormal glucose   . Borderline hypertension   . BPH (benign prostatic hypertrophy)   . Crushing injury of left elbow and forearm 07/13/2015  . 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 Dizziness   . Hyperlipidemia   . Hypertension   . Hypogonadism male   . 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 Morbid obesity (HCC)   . Obesity (BMI 30-39.9)   . OSA on CPAP   . Prediabetes   . Right ureteral stone   . Sleep apnea    C PAP  . Vertigo   . Vitamin D deficiency   . Wears glasses     Past Surgical History:  Procedure Laterality Date  . 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 ARTERY REPAIR Right 05/31/2014   Procedure: IRRIGATION, EXPLORATION, AND REPAIR OF RIGHT ARM WOUND;  Surgeon: Cherylynn Ridges, MD;  Location: [facility];  Service: General;  Laterality: Right;  . CYSTOSCOPY WITH RETROGRADE PYELOGRAM, URETEROSCOPY AND STENT PLACEMENT Right 07/11/2016   Procedure: CYSTOSCOPY WITH RETROGRADE PYELOGRAM, URETEROSCOPY, BASKET STONE EXTRACTION  AND STENT  PLACEMENT;  Surgeon: Sebastian Ache, MD;  Location: [facility];  Service: Urology;  Laterality: Right;  45 MINS  C-ARM DIGITAL URETEROSCOPE HOLMIUM LASER  . ESOPHAGOGASTRODUODENOSCOPY      There were no vitals filed for this visit.  Subjective Assessment - 06/01/20 0848    Subjective  Patient reports that had a bad episode of dizziness, and took a longer time to resolve. Dizziness rating 1/10 currently.    Pertinent History  BPH, prediabetes, dizziness, hypertension, hyperlipidemia, Sleep Apnea, Depression, Obesity    Diagnostic tests  results of MRI: cervical and lumbar spine unremarkable    Currently in Pain?  No/denies              Vestibular Assessment - 06/01/20 0001      Other Tests   Comments  Had patient complete DHI in session to allow for further assessment of current dizziness and impact on daily functions. Patient scoring a 48/100 on DHI today.                [facility] Adult PT Treatment/Exercise - 06/01/20 0854      Ambulation/Gait   Ambulation/Gait  Yes    Ambulation/Gait Assistance  5: Supervision    Ambulation/Gait Assistance Details  throughout therapy gym with activties, supv for safety  Assistive device  None    Gait Pattern  Step-through pattern;Wide base of support    Ambulation Surface  Level;Indoor      Neuro Re-ed    Neuro Re-ed Details   Completed gait with self ball toss with patient tracking with eyes 3 x 1 lap. Patient reports moderate dizziness, requiring rest break after completion of each lap, and at end of completion due to dizziness. With gait completed eye tracking in diagonal pattern with completion of "V", 30' x 4 laps each.  Increased dizziness with eye tracking "V", but reports less dizziness than with self ball toss.           Balance Exercises - 06/01/20 0001      Balance Exercises: Standing   Other Standing Exercises  In standing, with PT provided posterior pull with belt and have patient counteract  with anterior weight translation by patient to maintain balance, completed x 5 minutes.          PT Short Term Goals - 05/30/20 1047      PT SHORT TERM GOAL #1   Title  Patient will be independent with initial HEP    Baseline  Pt verbalizes indepedence    Time  3    Period  Weeks    Status  Achieved    Target Date  05/25/20      PT SHORT TERM GOAL #2   Title  FGA to be assessed and LTG set as appropriate    Baseline  LTG written.    Time  3    Period  Weeks    Status  Achieved    Target Date  05/25/20        PT Long Term Goals - 05/04/20 1008      PT LONG TERM GOAL #1   Title  Patient will be independent with final HEP    Time  6    Period  Weeks    Status  New    Target Date  06/15/20      PT LONG TERM GOAL #2   Title  Patient will improve gait speed >/= 3.5 ft/sec to demonstrate improved mobility    Baseline  2.98 ft/sec    Time  6    Period  Weeks    Status  New    Target Date  06/15/20      PT LONG TERM GOAL #3   Title  Patient will completed TUG in </= 10 secs to demonstrate reduced risk for falls.    Baseline  11.91    Time  6    Period  Weeks    Status  New    Target Date  06/15/20      PT LONG TERM GOAL #4   Title  Pt will report at least 50% improvement in symptoms regarding dizziness and headaches    Time  6    Period  Weeks    Status  New    Target Date  06/15/20      PT LONG TERM GOAL #5   Title  Patient will improve FGA by 5 points from baseline to demonstrate improved balance and reduced risk for falls    Time  6    Period  Weeks    Status  New    Target Date  06/15/20            Plan - 06/01/20 0857    Clinical Impression Statement  Today's skilled session included completion of  DHI, with patient scoring 48/100. Completed balance and vestibular exercises focused on targeting the visual and vestibular system. Patient reporting increased dizziness with gait activities incorporating eye tracking, requirin extended rest breaks. Pt  will continue to benefit from skilled PT services to progress toward goals.    Personal Factors and Comorbidities  Comorbidity 3+;Time since onset of injury/illness/exacerbation    Comorbidities  BPH, prediabetes, dizziness, hypertension, hyperlipidemia, Sleep Apnea, Depression, Obesity    Examination-Activity Limitations  Bend;Lift    Examination-Participation Restrictions  Driving;Other   Work   [employer]chant [employer] officer  Evolving/Moderate complexity    Rehab Potential  Good    PT Frequency  2x / week    PT Duration  6 weeks    PT Treatment/Interventions  ADLs/Self Care Home Management;Canalith Repostioning;Cryotherapy;Moist Heat;Gait training;Stair training;Functional mobility training;Therapeutic activities;Therapeutic exercise;Balance training;Neuromuscular re-education;Patient/family education;Vestibular    PT Next Visit Plan  Continue balance and vestibular (focus on visual, vestibular system), Habituation exercises. Exercises focused on shifting weigth anterior due to keeping weight posterior with standing    Consulted and Agree with Plan of Care  Patient       Patient will benefit from skilled therapeutic intervention in order to improve the following deficits and impairments:  Abnormal gait, Decreased balance, Decreased mobility, Difficulty walking, Impaired vision/preception, Dizziness, Decreased cognition, Decreased activity tolerance, Decreased strength, Pain  Visit Diagnosis: Unsteadiness on feet  Other abnormalities of gait and mobility  Muscle weakness (generalized)  Dizziness and giddiness     Problem List Patient Active Problem List   Diagnosis Date Noted  . Obstructive sleep apnea hypopnea, severe 03/16/2020  . 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  . Excessive daytime sleepiness 02/13/2020  . Chronic fatigue 02/13/2020  . BPH with obstruction/lower urinary tract symptoms   . Prediabetes 10/11/2018  . Former smoker 10/11/2018  . Family history of  ischemic heart disease 10/11/2018  . FH: hypertension 03/23/2018  . Crushing injury of left elbow and forearm 07/13/2015  . Depression, major, in partial remission ([place]) 07/12/2015  . Medication management 02/08/2014  . Essential hypertension   . Hyperlipidemia, mixed   . OSA on CPAP   . Abnormal glucose   . Morbid obesity ([place])   . Vitamin D deficiency     Jones Bales, PT, DPT 06/01/2020, 10:56 AM  [facility] [address] [place], [place], [address] Phone: [phone]   Fax:  [phone]  Name: Shane Saunders MRN: [id number] Date of Birth: 1971/07/13

## 2020-06-02 ENCOUNTER — Other Ambulatory Visit: Payer: Self-pay | Admitting: Internal Medicine

## 2020-06-02 DIAGNOSIS — L739 Follicular disorder, unspecified: Secondary | ICD-10-CM

## 2020-06-04 ENCOUNTER — Encounter: Payer: Self-pay | Admitting: Family Medicine

## 2020-06-04 ENCOUNTER — Ambulatory Visit (INDEPENDENT_AMBULATORY_CARE_PROVIDER_SITE_OTHER): Payer: BC Managed Care – PPO | Admitting: Family Medicine

## 2020-06-04 ENCOUNTER — Other Ambulatory Visit: Payer: Self-pay

## 2020-06-04 VITALS — BP 106/72 | HR 70 | Ht 70.0 in | Wt 259.0 lb

## 2020-06-04 DIAGNOSIS — Z9989 Dependence on other enabling machines and devices: Secondary | ICD-10-CM | POA: Diagnosis not present

## 2020-06-04 DIAGNOSIS — G4733 Obstructive sleep apnea (adult) (pediatric): Secondary | ICD-10-CM

## 2020-06-04 DIAGNOSIS — I10 Essential (primary) hypertension: Secondary | ICD-10-CM | POA: Diagnosis not present

## 2020-06-04 DIAGNOSIS — G43109 Migraine with aura, not intractable, without status migrainosus: Secondary | ICD-10-CM

## 2020-06-04 MED ORDER — ERENUMAB-AOOE 140 MG/ML ~~LOC~~ SOAJ
140.0000 mg | SUBCUTANEOUS | 3 refills | Status: DC
Start: 2020-06-04 — End: 2020-11-27

## 2020-06-04 MED ORDER — SUMATRIPTAN SUCCINATE 100 MG PO TABS
100.0000 mg | ORAL_TABLET | Freq: Once | ORAL | 2 refills | Status: DC | PRN
Start: 2020-06-04 — End: 2020-09-10

## 2020-06-04 NOTE — Patient Instructions (Signed)
We will increase Amovig to 140mg  every month. We will try sumatriptan in place of rizatriptan. You can take one tablet at onset, may repeat 1 tablet in 2 hours if needed. Avoid taking more than 2 tablet in 24 hours or 10 tablets per month.   Stay well hydrated. Regular exercise. Well balanced diet advised.   Consider wearing glasses for astigmatism. Talk with your ophthalmologist if symptoms do not improve.   Follow up with in 6 months, sooner if needed   Please continue using your CPAP regularly. While your insurance requires that you use CPAP at least 4 hours each night on 70% of the nights, I recommend, that you not skip any nights and use it throughout the night if you can. Getting used to CPAP and staying with the treatment long term does take time and patience and discipline. Untreated obstructive sleep apnea when it is moderate to severe can have an adverse impact on cardiovascular health and raise her risk for heart disease, arrhythmias, hypertension, congestive heart failure, stroke and diabetes. Untreated obstructive sleep apnea causes sleep disruption, nonrestorative sleep, and sleep deprivation. This can have an impact on your day to day functioning and cause daytime sleepiness and impairment of cognitive function, memory loss, mood disturbance, and problems focussing. Using CPAP regularly can improve these symptoms.  Sumatriptan tablets What is this medicine? SUMATRIPTAN (soo ma TRIP tan) is used to treat migraines with or without aura. An aura is a strange feeling or visual disturbance that warns you of an attack. It is not used to prevent migraines. This medicine may be used for other purposes; ask your health care provider or pharmacist if you have questions. COMMON BRAND NAME(S): Imitrex, Migraine Pack What should I tell my health care provider before I take this medicine? They need to know if you have any of these conditions:  cigarette smoker  circulation problems in  fingers and toes  diabetes  heart disease  high blood pressure  high cholesterol  history of irregular heartbeat  history of stroke  kidney disease  liver disease  stomach or intestine problems  an unusual or allergic reaction to sumatriptan, other medicines, foods, dyes, or preservatives  pregnant or trying to get pregnant  breast-feeding How should I use this medicine? Take this medicine by mouth with a glass of water. Follow the directions on the prescription label. Do not take it more often than directed. Talk to your pediatrician regarding the use of this medicine in children. Special care may be needed. Overdosage: If you think you have taken too much of this medicine contact a poison control center or emergency room at once. NOTE: This medicine is only for you. Do not share this medicine with others. What if I miss a dose? This does not apply. This medicine is not for regular use. What may interact with this medicine? Do not take this medicine with any of the following medicines:  certain medicines for migraine headache like almotriptan, eletriptan, frovatriptan, naratriptan, rizatriptan, sumatriptan, zolmitriptan  ergot alkaloids like dihydroergotamine, ergonovine, ergotamine, methylergonovine  MAOIs like Carbex, Eldepryl, Marplan, Nardil, and Parnate This medicine may also interact with the following medications:  certain medicines for depression, anxiety, or psychotic disorders This list may not describe all possible interactions. Give your health care provider a list of all the medicines, herbs, non-prescription drugs, or dietary supplements you use. Also tell them if you smoke, drink alcohol, or use illegal drugs. Some items may interact with your medicine. What should  I watch for while using this medicine? Visit your healthcare professional for regular checks on your progress. Tell your healthcare professional if your symptoms do not start to get better or if  they get worse. You may get drowsy or dizzy. Do not drive, use machinery, or do anything that needs mental alertness until you know how this medicine affects you. Do not stand up or sit up quickly, especially if you are an older patient. This reduces the risk of dizzy or fainting spells. Alcohol may interfere with the effect of this medicine. Tell your healthcare professional right away if you have any change in your eyesight. If you take migraine medicines for 10 or more days a month, your migraines may get worse. Keep a diary of headache days and medicine use. Contact your healthcare professional if your migraine attacks occur more frequently. What side effects may I notice from receiving this medicine? Side effects that you should report to your doctor or health care professional as soon as possible:  allergic reactions like skin rash, itching or hives, swelling of the face, lips, or tongue  changes in vision  chest pain or chest tightness  signs and symptoms of a dangerous change in heartbeat or heart rhythm like chest pain; dizziness; fast, irregular heartbeat; palpitations; feeling faint or lightheaded; falls; breathing problems  signs and symptoms of a stroke like changes in vision; confusion; trouble speaking or understanding; severe headaches; sudden numbness or weakness of the face, arm or leg; trouble walking; dizziness; loss of balance or coordination  signs and symptoms of serotonin syndrome like irritable; confusion; diarrhea; fast or irregular heartbeat; muscle twitching; stiff muscles; trouble walking; sweating; high fever; seizures; chills; vomiting Side effects that usually do not require medical attention (report to your doctor or health care professional if they continue or are bothersome):  diarrhea  dizziness  drowsiness  dry mouth  headache  nausea, vomiting  pain, tingling, numbness in the hands or feet  stomach pain This list may not describe all possible  side effects. Call your doctor for medical advice about side effects. You may report side effects to FDA at 1-800-FDA-1088. Where should I keep my medicine? Keep out of the reach of children. Store at room temperature between 2 and 30 degrees C (36 and 86 degrees F). Throw away any unused medicine after the expiration date. NOTE: This sheet is a summary. It may not cover all possible information. If you have questions about this medicine, talk to your doctor, pharmacist, or health care provider.  2020 Elsevier/Gold Standard (2018-06-29 15:05:37)   Beckie Busing injection What is this medicine? ERENUMAB (e REN ue mab) is used to prevent migraine headaches. This medicine may be used for other purposes; ask your health care provider or pharmacist if you have questions. COMMON BRAND NAME(S): Aimovig What should I tell my health care provider before I take this medicine? They need to know if you have any of these conditions:  an unusual or allergic reaction to erenumab, latex, other medicines, foods, dyes, or preservatives  high blood pressure  pregnant or trying to get pregnant  breast-feeding How should I use this medicine? This medicine is for injection under the skin. You will be taught how to prepare and give this medicine. Use exactly as directed. Take your medicine at regular intervals. Do not take your medicine more often than directed. It is important that you put your used needles and syringes in a special sharps container. Do not put them in a trash  can. If you do not have a sharps container, call your pharmacist or healthcare provider to get one. Talk to your pediatrician regarding the use of this medicine in children. Special care may be needed. Overdosage: If you think you have taken too much of this medicine contact a poison control center or emergency room at once. NOTE: This medicine is only for you. Do not share this medicine with others. What if I miss a dose? If you miss a  dose, take it as soon as you can. If it is almost time for your next dose, take only that dose. Do not take double or extra doses. What may interact with this medicine? Interactions are not expected. This list may not describe all possible interactions. Give your health care provider a list of all the medicines, herbs, non-prescription drugs, or dietary supplements you use. Also tell them if you smoke, drink alcohol, or use illegal drugs. Some items may interact with your medicine. What should I watch for while using this medicine? Tell your doctor or healthcare professional if your symptoms do not start to get better or if they get worse. What side effects may I notice from receiving this medicine? Side effects that you should report to your doctor or health care professional as soon as possible:  allergic reactions like skin rash, itching or hives, swelling of the face, lips, or tongue  chest pain  fast, irregular heartbeat  feeling faint or lightheaded  palpitations Side effects that usually do not require medical attention (report these to your doctor or health care professional if they continue or are bothersome):  constipation  muscle cramps  pain, redness, or irritation at site where injected This list may not describe all possible side effects. Call your doctor for medical advice about side effects. You may report side effects to FDA at 1-800-FDA-1088. Where should I keep my medicine? Keep out of the reach of children. You will be instructed on how to store this medicine. Throw away any unused medicine after the expiration date on the label. NOTE: This sheet is a summary. It may not cover all possible information. If you have questions about this medicine, talk to your doctor, pharmacist, or health care provider.  2020 Elsevier/Gold Standard (2019-05-02 15:43:58)   Tension Headache, Adult A tension headache is pain, pressure, or aching in your head. Tension headaches can  last from 30 minutes to several days. Follow these instructions at home: Managing pain  Take over-the-counter and prescription medicines only as told by your doctor.  When you have a headache, lie down in a dark, quiet room.  If told, put ice on your head and neck: ? Put ice in a plastic bag. ? Place a towel between your skin and the bag. ? Leave the ice on for 20 minutes, 2-3 times a day.  If told, put heat on the back of your neck. Do this as often as your doctor tells you to. Use the kind of heat that your doctor recommends, such as a moist heat pack or a heating pad. ? Place a towel between your skin and the heat. ? Leave the heat on for 20-30 minutes. ? Remove the heat if your skin turns bright red. Eating and drinking  Eat meals on a regular schedule.  Watch how much alcohol you drink: ? If you are a woman and are not pregnant, do not drink more than 1 drink a day. ? If you are a man, do not drink more than  2 drinks a day.  Drink enough fluid to keep your pee (urine) pale yellow.  Do not use a lot of caffeine, or stop using caffeine. Lifestyle  Get enough sleep. Get 7-9 hours of sleep each night. Or get the amount of sleep that your doctor tells you to.  At bedtime, remove all electronic devices from your room. Examples of electronic devices are computers, phones, and tablets.  Find ways to lessen your stress. Some things that can lessen stress are: ? Exercise. ? Deep breathing. ? Yoga. ? Music. ? Positive thoughts.  Sit up straight. Do not tighten (tense) your muscles.  Do not use any products that have nicotine or tobacco in them, such as cigarettes and e-cigarettes. If you need help quitting, ask your doctor. General instructions   Keep all follow-up visits as told by your doctor. This is important.  Avoid things that can bring on headaches. Keep a journal to find out if certain things bring on headaches. For example, write down: ? What you eat and drink. ?  How much sleep you get. ? Any change to your diet or medicines. Contact a doctor if:  Your headache does not get better.  Your headache comes back.  You have a headache and sounds, light, or smells bother you.  You feel sick to your stomach (nauseous) or you throw up (vomit).  Your stomach hurts. Get help right away if:  You suddenly get a very bad headache along with any of these: ? A stiff neck. ? Feeling sick to your stomach. ? Throwing up. ? Feeling weak. ? Trouble seeing. ? Feeling short of breath. ? A rash. ? Feeling unusually sleepy. ? Trouble speaking. ? Pain in your eye or ear. ? Trouble walking or balancing. ? Feeling like you will pass out (faint). ? Passing out. Summary  A tension headache is pain, pressure, or aching in your head.  Tension headaches can last from 30 minutes to several days.  Lifestyle changes and medicines may help relieve pain. This information is not intended to replace advice given to you by your health care provider. Make sure you discuss any questions you have with your health care provider. Document Revised: 10/12/2019 Document Reviewed: 03/27/2017 Elsevier Patient Education  2020 [employer]   Migraine Headache A migraine headache is a very strong throbbing pain on one side or both sides of your head. This type of headache can also cause other symptoms. It can last from 4 hours to 3 days. Talk with your doctor about what things may bring on (trigger) this condition. What are the causes? The exact cause of this condition is not known. This condition may be triggered or caused by:  Drinking alcohol.  Smoking.  Taking medicines, such as: ? Medicine used to treat chest pain (nitroglycerin). ? Birth control pills. ? Estrogen. ? Some blood pressure medicines.  Eating or drinking certain products.  Doing physical activity. Other things that may trigger a migraine headache include:  Having a menstrual period.  Pregnancy.   Hunger.  Stress.  Not getting enough sleep or getting too much sleep.  Weather changes.  Tiredness (fatigue). What increases the risk?  Being 5125-892 years old.  Being male.  Having a family history of migraine headaches.  Being Caucasian.  Having depression or anxiety.  Being very overweight. What are the signs or symptoms?  A throbbing pain. This pain may: ? Happen in any area of the head, such as on one side or both sides. ?  Make it hard to do daily activities. ? Get worse with physical activity. ? Get worse around bright lights or loud noises.  Other symptoms may include: ? Feeling sick to your stomach (nauseous). ? Vomiting. ? Dizziness. ? Being sensitive to bright lights, loud noises, or smells.  Before you get a migraine headache, you may get warning signs (an aura). An aura may include: ? Seeing flashing lights or having blind spots. ? Seeing bright spots, halos, or zigzag lines. ? Having tunnel vision or blurred vision. ? Having numbness or a tingling feeling. ? Having trouble talking. ? Having weak muscles.  Some people have symptoms after a migraine headache (postdromal phase), such as: ? Tiredness. ? Trouble thinking (concentrating). How is this treated?  Taking medicines that: ? Relieve pain. ? Relieve the feeling of being sick to your stomach. ? Prevent migraine headaches.  Treatment may also include: ? Having acupuncture. ? Avoiding foods that bring on migraine headaches. ? Learning ways to control your body functions (biofeedback). ? Therapy to help you know and deal with negative thoughts (cognitive behavioral therapy). Follow these instructions at home: Medicines  Take over-the-counter and prescription medicines only as told by your doctor.  Ask your doctor if the medicine prescribed to you: ? Requires you to avoid driving or using heavy machinery. ? Can cause trouble pooping (constipation). You may need to take these steps to  prevent or treat trouble pooping:  Drink enough fluid to keep your pee (urine) pale yellow.  Take over-the-counter or prescription medicines.  Eat foods that are high in fiber. These include beans, whole grains, and fresh fruits and vegetables.  Limit foods that are high in fat and sugar. These include fried or sweet foods. Lifestyle  Do not drink alcohol.  Do not use any products that contain nicotine or tobacco, such as cigarettes, e-cigarettes, and chewing tobacco. If you need help quitting, ask your doctor.  Get at least 8 hours of sleep every night.  Limit and deal with stress. General instructions      Keep a journal to find out what may bring on your migraine headaches. For example, write down: ? What you eat and drink. ? How much sleep you get. ? Any change in what you eat or drink. ? Any change in your medicines.  If you have a migraine headache: ? Avoid things that make your symptoms worse, such as bright lights. ? It may help to lie down in a dark, quiet room. ? Do not drive or use heavy machinery. ? Ask your doctor what activities are safe for you.  Keep all follow-up visits as told by your doctor. This is important. Contact a doctor if:  You get a migraine headache that is different or worse than others you have had.  You have more than 15 headache days in one month. Get help right away if:  Your migraine headache gets very bad.  Your migraine headache lasts longer than 72 hours.  You have a fever.  You have a stiff neck.  You have trouble seeing.  Your muscles feel weak or like you cannot control them.  You start to lose your balance a lot.  You start to have trouble walking.  You pass out (faint).  You have a seizure. Summary  A migraine headache is a very strong throbbing pain on one side or both sides of your head. These headaches can also cause other symptoms.  This condition may be treated with medicines and changes to  your  lifestyle.  Keep a journal to find out what may bring on your migraine headaches.  Contact a doctor if you get a migraine headache that is different or worse than others you have had.  Contact your doctor if you have more than 15 headache days in a month. This information is not intended to replace advice given to you by your health care provider. Make sure you discuss any questions you have with your health care provider. Document Revised: 04/08/2019 Document Reviewed: 01/27/2019 Elsevier Patient Education  2020 [employer].

## 2020-06-04 NOTE — Progress Notes (Signed)
PATIENT: Shane Saunders DOB: 05-06-1971  REASON FOR VISIT: follow up HISTORY FROM: patient  Chief Complaint  Patient presents with   Follow-up    Rm 1 here for a f/u on OSA.     HISTORY OF PRESENT ILLNESS: Today 06/04/20 Shane Saunders is a 49 y.o. male here today for follow up OSA on CPAP therapy as well as chronic headaches.  He was previously on CPAP therapy, however, new sleep study was ordered and CPAP machine replaced due to age.  He states that he is doing well with CPAP usage.  He is using CPAP nightly.  He denies any difficulty with his machine or supplies.  He has been using CPAP for years and does feel that he sleeps well on therapy.  Headaches continue.  He describes of chronic, daily pressure sensation in various locations of the head.  Sometimes headache is associated with light sensitivity.  Headaches are worse with activity.  He denies sound sensitivity or nausea.  He does have intermittent vertiginous symptoms.  He is participating in vestibular rehab and feels that this is helpful.  He continues close follow-up with his primary care provider for management of comorbidities.  He does have chronic sinusitis, depression anxiety that could into headaches.  He drinks at least 50 ounces of water daily.  He has started working on healthier eating habits.  He does note blurry vision, specifically after looking at the TV or a phone for an extended period of time.  He feels that it takes a little bit longer for his eyes to focus.  He was diagnosed with an astigmatism and given glasses to wear.  He has not worn glasses consistently.  He was started on Aimovig injections 70 mg every month.  He has taken 2 injections since last being seen in April.  He has not noted any significant improvement in headache intensity or frequency.  He has also tried rizatriptan for abortive therapy.  He does not feel that this has been very beneficial.  He uses over-the-counter analgesics sparingly.  He  does continue gabapentin for chronic pain and headache management.  He is on metoprolol and verapamil for blood pressure management.  Blood pressures are usually within normal range.  Compliance report dated 05/05/2020 through 06/03/2020 reveals that he used CPAP 28 of the last 30 days for compliance of 93%.  He used CPAP greater than 4 hours 27 of the past 30 days for compliance of 90%.  Average usage was 8 hours 19 minutes.  Residual AHI was 2.4 on 6 to 19 cm of water and an EPR of 3.  There was no significant leak noted.  HISTORY: (copied from Dr Richrd Humbles note on 04/17/2020)  UPDATE (04/17/20, VRP): Since last visit, more headaches, dizzines, gait difficulty. Symptoms are progressive. Throbbing HA, sens to light, dizzy, scalp sens. General gait and balance issues. No alleviating or aggravating factors. Tolerating meds. Gabapentin not helping.     PRIOR HPI (02/06/20): 49 year old male here for evaluation of dizziness and gait difficulty.  2 years ago patient had onset of dizziness and intermittent right lip swelling.  His dizziness is described as feeling off balance, lightheadedness, brain fog and fatigue.  He denies any spinning or vertigo sensations.  However he did follow-up with ENT, apparently had some vestibular testing showing some dysfunction.  He underwent vestibular therapy exercises without relief.  Also was having intermittent right upper lip swelling attacks lasting a few hours at a time and now more  consistent.  He went to ENT and dermatology for evaluation without specific diagnosis.  Allergy immunology consult was considered but has not been pursued yet.  He had variety of testing for autoimmune inflammatory etiologies which have been unremarkable.  Patient also has history of depression, on medication in the past.  These medications have been weaned off to see if this would help improve symptoms but unfortunately symptoms have continued.  Patient has history of sleep apnea on  CPAP, with testing from 12 years ago.  He has a sleep consult pending.  Patient has some low back pain issues but denies any numbness or tingling in his feet or legs.  History (copied from Dr Dohmeier's note on 02/13/2020)  Shane Marciano Swoffordis a 49 y.o. Caucasian male patientseen here upon  referralon 02/13/2020 from Dr Edman Circle. Chiefconcernaccording to patient :  ' I am frustrated , 2 years of dizziness - and my CPAP needs to be replaced"  I have the pleasure of seeing Shane Saunders today,a right -handed White or Caucasian male with OSA sleep disorder. She has a  has a past medical history of Abnormal glucose, Borderline hypertension, BPH (benign prostatic hypertrophy), Crushing injury of left elbow and forearm (07/13/2015), Depression, major, in partial remission (HCC) (07/12/2015), Dizziness, Hyperlipidemia, Hypertension, Hypogonadism male, Malrotation of colon, Morbid obesity (HCC), Obesity (BMI 30-39.9), OSA on CPAP, Prediabetes, Right ureteral stone, Sleep apnea, Vertigo, Vitamin D deficiency, and Wears glasses.   2 years ago patient had onset of dizziness and intermittent right lip swelling.  His dizziness is described as feeling off balance, lightheadedness, brain fog and fatigue.  He denies any spinning or vertigo sensations.  However he did follow-up with ENT, apparently had some vestibular testing showing some dysfunction.  He underwent vestibular therapy exercises without relief. Dr Marjory Lies order imaging studies.  Also was having intermittent right upper lip swelling attacks lasting a few hours at a time and now more consistent.  He went to ENT and dermatology for evaluation without specific diagnosis.  Allergy immunology consult was considered but has not been pursued yet.  He had variety of testing for autoimmune inflammatory etiologies which have been unremarkable. Patient also has history of depression, on medication in the past.  These medications have been weaned off to see  if this would help improve symptoms but unfortunately symptoms have continued. Patient has some low back pain issues but denies any numbness or tingling in his feet or legs.  He has been referred for a new CPAP- his wife has recorded him stopping to breath for 27 seconds a long time, snores loudly- just this last weekend his house lost power and his sleep was miserable.  The patient had the first sleep study in the year 2007 at [facility], with a result of severe OSA and loud snoring.  His compliance data for his S9 Elite ResMed CPAP showed 97% of days and 93% 4 hours nightly.  His average user time is 8 hours and 2 minutes which is very good.  His AHI residual apnea and hypopnea index was 3.3/h of these obstructive apneas very few centrals.  He does have an air leak at 17.7 L/min so his mask may not be the best fitting for him.  The serial number of the current machine is [id number].  Even while using  CPAP he endorsed the Epworth sleepiness score at 19 out of 24 points which is a very high scale.  His fatigue severity scale is equally high at 59 out of  63. He has frequent nasal CONGESTION, SINUSITIS, COUGHIN and PHLEGM-  Familymedical /sleep history:Father with OSA.  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  and lives in a household with 4 persons.  The patient currently doesn't work- was lad off after 31 years - Pets are not present . Tobacco use; quit 2010. ETOH use; rarely, Caffeine intake in form of Coffee( none) Soda( none) Tea ( with lunch or dinner outside-) and no energy drinks. Regular exercise: weight / swimming - not now- lost weight 253 .[address] Kitchen.     Sleep habits are as follows:The patient's dinner time is between 5.30 PM but he snacks- The patient goes to bed at 12.30 AM and continues to sleep for 3 hours, but he uses 0.5 mg xanax,  wakes rarely bathroom breaks. The preferred  sleep position is prone, with the support of 1 pillow.  Dreams are reportedly rare.  8 AM is the usual rise time. The patient wakes up with his wife, who wakes him at 7 AM.  He reports not feeling refreshed or restored in AM, with symptoms such as dry mouth, morning headaches when he had no CPAP available, and residual fatigue.  Naps are taken infrequently.     REVIEW OF SYSTEMS: Out of a complete 14 system review of symptoms, the patient complains only of the following symptoms, headaches, sleep apnea, dizziness, anxiety, chronic pain, blurry vision and all other reviewed systems are negative.  ALLERGIES: Allergies  Allergen Reactions   Codeine Other (See Comments)    "I cannot sleep"   Effexor [Venlafaxine] Other (See Comments)    "E.D."   Prednisone Other (See Comments)    "I cannot sleep"   Tramadol Hcl Itching   Zinc Nausea Only and Other (See Comments)    "Upsets my stomach"    HOME MEDICATIONS: Outpatient Medications Prior to Visit  Medication Sig Dispense Refill   ALPRAZolam (XANAX) 1 MG tablet 1/2 TO 1TAB 2 TO 3 TIMES A DAY ONLY IF NEED FOR ANXIETY. LIMIT 5 DAYS/WK TO AVOID ADDICTION/DEMENTIA 60 tablet 0   atorvastatin (LIPITOR) 80 MG tablet TAKE 1 TABLET BY MOUTH EVERY DAY 90 tablet 1   buPROPion (WELLBUTRIN XL) 300 MG 24 hr tablet Take 1 tablet every Morning for Mood, Focus & Concentration 90 tablet 1   cetirizine-pseudoephedrine (ZYRTEC-D) 5-120 MG tablet Takes 1 tablet 2 x/day as needed - congestion     Cholecalciferol (VITAMIN D3) 250 MCG (10000 UT) TABS Take 10,000 Units by mouth daily.     Cyanocobalamin (VITAMIN B 12 PO) Take 1 tablet by mouth daily.     doxycycline (VIBRAMYCIN) 100 MG capsule Take 1 capsule 2 x  /day with food for skin Infection 180 capsule 0   escitalopram (LEXAPRO) 20 MG tablet Take 1 tablet Daily for Mood (Patient taking differently: Take 20 mg by mouth daily. ) 90 tablet 3   fexofenadine (ALLEGRA) 60 MG tablet Takes 1 tablet 2 x  /day as needed for Allergies (Patient taking differently: Take 60 mg by mouth 2 (two) times daily as needed (for seasonal allergies). )     gabapentin (NEURONTIN) 800 MG tablet Take 1 tablet 3 x day at 8 am, 5 pm & Bedtime for Chronic Pain & Headaches 270 tablet 0   nystatin (MYCOSTATIN) 500000 units TABS tablet      rizatriptan (MAXALT-MLT) 10 MG disintegrating tablet Take 1 tablet (10 mg total) by mouth as  needed for migraine. May repeat in 2 hours if needed 9 tablet 11   silodosin (RAPAFLO) 8 MG CAPS capsule Take 1 capsule at Bedtime for Prostate (Patient taking differently: Take 8 mg by mouth daily with breakfast. ) 90 capsule 3   verapamil (CALAN-SR) 180 MG CR tablet Take 1 tablet Daily with Food for Migraine Prevention 90 tablet 1   Erenumab-aooe (AIMOVIG) 70 MG/ML SOAJ Inject 70 mg into the skin every 30 (thirty) days. 3 pen 4   cyclobenzaprine (FLEXERIL) 10 MG tablet Take 1/2 to 1 tablet 3 x /day as needed for Muscle Spasm or headache (Patient taking differently: Take 5-10 mg by mouth 3 (three) times daily as needed for muscle spasms (or headaches). ) 30 tablet 0   fluticasone (FLONASE) 50 MCG/ACT nasal spray Place 1-2 sprays into both nostrils daily as needed for allergies or rhinitis.     trazodone (DESYREL) 300 MG tablet Take 1/2 to 1 tablet 1 hour before Bedtime 90 tablet 0   No facility-administered medications prior to visit.    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   Morbid obesity (HCC)    Obesity (BMI 30-39.9)    OSA on CPAP    Prediabetes    Right ureteral stone    Sleep apnea    C PAP   Vertigo     Vitamin D deficiency    Wears glasses     PAST SURGICAL HISTORY: Past Surgical History:  Procedure Laterality Date   ABDOMINAL EXPLORATION SURGERY  1998   Celiotomy and Appendectomy /  (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, congenital)   ARTERY REPAIR Right 05/31/2014   Procedure: IRRIGATION, EXPLORATION, AND REPAIR OF RIGHT ARM WOUND;  Surgeon: Gwenyth Ober, MD;  Location: [facility];  Service: General;  Laterality: Right;   CYSTOSCOPY WITH RETROGRADE PYELOGRAM, URETEROSCOPY AND STENT PLACEMENT Right 07/11/2016   Procedure: CYSTOSCOPY WITH RETROGRADE PYELOGRAM, URETEROSCOPY, BASKET STONE EXTRACTION  AND STENT PLACEMENT;  Surgeon: Alexis Frock, MD;  Location: [facility];  Service: Urology;  Laterality: Right;  58 MINS  C-ARM DIGITAL URETEROSCOPE HOLMIUM LASER   ESOPHAGOGASTRODUODENOSCOPY      FAMILY HISTORY: Family History  Problem Relation Age of Onset   Hypertension Mother    Migraines Mother    Thyroid disease Mother    Hypertension Father    Hyperlipidemia Father    Diabetes Father    Cancer Sister        thyroid   CAD Paternal Grandfather     SOCIAL HISTORY: Social History   Socioeconomic History   Marital status: Married    Spouse name: Leafy Ro   Number of children: 2   Years of education: 12   Highest education level: Not on file  Occupational History    Comment: na  Tobacco Use   Smoking status: Former Smoker    Packs/day: 0.50    Years: 10.00    Pack years: 5.00    Types: Cigarettes    Quit date: 10/27/2009    Years since quitting: 10.6   Smokeless tobacco: Former Systems developer    Types:  Snuff  Substance and Sexual Activity   Alcohol use: Yes    Comment: occasional   Drug use: No   Sexual activity: Yes    Comment: Vasectomy  Other Topics Concern   Not on file  Social History Narrative   Lives with wife   Caffeine -tea, 1 daily       Social Determinants of Health   Financial Resource Strain:    Difficulty of Paying  Living Expenses:   Food Insecurity:    Worried About Programme researcher, broadcasting/film/video in the Last Year:    Barista in the Last Year:   Transportation Needs:    Freight forwarder (Medical):    Lack of Transportation (Non-Medical):   Physical Activity:    Days of Exercise per Week:    Minutes of Exercise per Session:   Stress:    Feeling of Stress :   Social Connections:    Frequency of Communication with Friends and Family:    Frequency of Social Gatherings with Friends and Family:    Attends Religious Services:    Active Member of Clubs or Organizations:    Attends Banker Meetings:    Marital Status:   Intimate Partner Violence:    Fear of Current or Ex-Partner:    Emotionally Abused:    Physically Abused:    Sexually Abused:       PHYSICAL EXAM  Vitals:   06/04/20 0849  BP: 106/72  Pulse: 70  Weight: 259 lb (117.5 kg)  Height: 5\' 10"  (1.778 m)   Body mass index is 37.16 kg/m.  Generalized: Well developed, in no acute distress  Cardiology: normal rate and rhythm, no murmur noted Respiratory: clear to auscultation bilaterally  Neurological examination  Mentation: Alert oriented to time, place, history taking. Follows all commands speech and language fluent Cranial nerve II-XII: Pupils were equal round reactive to light. Extraocular movements were full, visual field were full on confrontational test. Facial sensation and strength were normal. Uvula tongue midline. Head turning and shoulder shrug  were normal and symmetric. Motor: The motor testing reveals 5 over 5 strength of all 4 extremities. Good symmetric motor tone is noted throughout.  Sensory: Sensory testing is intact to soft touch on all 4 extremities. No evidence of extinction is noted.  Coordination: Cerebellar testing reveals good finger-nose-finger and heel-to-shin bilaterally.  Gait and station: Gait is normal.    DIAGNOSTIC DATA (LABS, IMAGING, TESTING) - I reviewed  patient records, labs, notes, testing and imaging myself where available.  No flowsheet data found.   Lab Results  Component Value Date   WBC 6.7 05/07/2020   HGB 15.5 05/07/2020   HCT 44.4 05/07/2020   MCV 94.1 05/07/2020   PLT CANCELED 05/07/2020      Component Value Date/Time   NA 141 05/07/2020 1417   NA 140 05/25/2018 0941   K 4.1 05/07/2020 1417   CL 105 05/07/2020 1417   CO2 30 05/07/2020 1417   GLUCOSE 92 05/07/2020 1417   BUN 13 05/07/2020 1417   BUN 10 05/25/2018 0941   CREATININE 1.23 05/07/2020 1417   CALCIUM 9.4 05/07/2020 1417   PROT 6.4 05/07/2020 1417   ALBUMIN 4.4 03/14/2020 1250   AST 19 05/07/2020 1417   AST 23 03/14/2020 1250   ALT 30 05/07/2020 1417   ALT 42 03/14/2020 1250   ALKPHOS 79 03/14/2020 1250   BILITOT 0.6 05/07/2020 1417   BILITOT 1.1 03/14/2020 1250   GFRNONAA 68 05/07/2020  1417   GFRAA 79 05/07/2020 1417   Lab Results  Component Value Date   CHOL 184 05/07/2020   HDL 34 (L) 05/07/2020   LDLCALC 116 (H) 05/07/2020   TRIG 217 (H) 05/07/2020   CHOLHDL 5.4 (H) 05/07/2020   Lab Results  Component Value Date   HGBA1C 5.1 05/07/2020   Lab Results  Component Value Date   VITAMINB12 1,040 05/07/2020   Lab Results  Component Value Date   TSH 1.14 05/07/2020       ASSESSMENT AND PLAN 49 y.o. year old male  has a past medical history of Abnormal glucose, Borderline hypertension, BPH (benign prostatic hypertrophy), Crushing injury of left elbow and forearm (07/13/2015), Depression, major, in partial remission (HCC) (07/12/2015), Dizziness, Hyperlipidemia, Hypertension, Hypogonadism male, Malrotation of colon, Morbid obesity (HCC), Obesity (BMI 30-39.9), OSA on CPAP, Prediabetes, Right ureteral stone, Sleep apnea, Vertigo, Vitamin D deficiency, and Wears glasses. here with     ICD-10-CM   1. Migraine with aura and without status migrainosus, not intractable  G43.109   2. OSA on CPAP  G47.33    Z99.89   3. Essential hypertension  I10      Quante is doing well on CPAP therapy.  Compliance report reveals optimal compliance.  He was encouraged to continue using CPAP nightly and for greater than 4 hours each night.  Unfortunately, he does continue to have chronic daily headaches.  I feel that these are multifactorial.  We have discussed multiple factors contributing to headaches.  We will increase Aimovig dosing to 140 mg every month.  I have asked that he give therapy at least 3 months to be effective.  We will change abortive therapy to sumatriptan 100 mg tablets.  He was advised on appropriate administration of this medication.  He was advised against regular use of over-the-counter analgesics.  I have encouraged healthy lifestyle habits with well-balanced diet and regular exercise.  Adequate hydration discussed.  He will continue close follow-up with primary care for management of comorbidities.  He will follow-up with me in 6 months, sooner if needed.  He verbalizes understanding and agreement with this plan.   No orders of the defined types were placed in this encounter.    Meds ordered this encounter  Medications   Erenumab-aooe 140 MG/ML SOAJ    Sig: Inject 140 mg into the skin every 30 (thirty) days.    Dispense:  3 pen    Refill:  3    Order Specific Question:   Supervising Provider    Answer:   Anson Fret [[id number]]   SUMAtriptan (IMITREX) 100 MG tablet    Sig: Take 1 tablet (100 mg total) by mouth once as needed for up to 1 dose for migraine. May repeat in 2 hours if headache persists or recurs.    Dispense:  10 tablet    Refill:  2    Order Specific Question:   Supervising Provider    Answer:   Anson Fret [id number]      I spent 30 minutes with the patient. 50% of this time was spent counseling and educating patient on plan of care and medications.    Shawnie Dapper, FNP-C 06/04/2020, 10:48 AM [facility] [address] [place], [place] [address] [phone]

## 2020-06-04 NOTE — Progress Notes (Signed)
I reviewed note and agree with plan.   Suanne Marker, MD 06/04/2020, 5:35 PM Certified in Neurology, Neurophysiology and Neuroimaging  [facility] [address] [place], [place] [address] [phone]

## 2020-06-06 ENCOUNTER — Other Ambulatory Visit: Payer: Self-pay

## 2020-06-06 ENCOUNTER — Ambulatory Visit: Payer: BC Managed Care – PPO

## 2020-06-06 DIAGNOSIS — R2689 Other abnormalities of gait and mobility: Secondary | ICD-10-CM

## 2020-06-06 DIAGNOSIS — R2681 Unsteadiness on feet: Secondary | ICD-10-CM

## 2020-06-06 DIAGNOSIS — M6281 Muscle weakness (generalized): Secondary | ICD-10-CM

## 2020-06-06 DIAGNOSIS — R42 Dizziness and giddiness: Secondary | ICD-10-CM

## 2020-06-06 NOTE — Therapy (Signed)
[facility] [address] [place], [place], [address] Phone: [phone]   Fax:  [phone]  Physical Therapy Treatment  Patient Details  Name: Shane Saunders MRN: [id number] Date of Birth: [birth year] Referring Provider (PT): Joycelyn Schmid, MD   Encounter Date: 06/06/2020  PT End of Session - 06/06/20 0807    Visit Number  8    Number of Visits  13    Date for PT Re-Evaluation  07/03/20   POC for 6 weeks, Cert for 60 days   Authorization Type  [employer]    PT Start Time  0800    PT Stop Time  0844    PT Time Calculation (min)  44 min    Equipment Utilized During Treatment  Gait belt    Activity Tolerance  Patient tolerated treatment well    Behavior During Therapy  [facility] for tasks assessed/performed       Past Medical History:  Diagnosis Date  . Abnormal glucose   . Borderline hypertension   . BPH (benign prostatic hypertrophy)   . Crushing injury of left elbow and forearm 07/13/2015  . 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 Dizziness   . Hyperlipidemia   . Hypertension   . Hypogonadism male   . 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 Morbid obesity (HCC)   . Obesity (BMI 30-39.9)   . OSA on CPAP   . Prediabetes   . Right ureteral stone   . Sleep apnea    C PAP  . Vertigo   . Vitamin D deficiency   . Wears glasses     Past Surgical History:  Procedure Laterality Date  . 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 ARTERY REPAIR Right 05/31/2014   Procedure: IRRIGATION, EXPLORATION, AND REPAIR OF RIGHT ARM WOUND;  Surgeon: Cherylynn Ridges, MD;  Location: [facility];  Service: General;  Laterality: Right;  . CYSTOSCOPY WITH RETROGRADE PYELOGRAM, URETEROSCOPY AND STENT PLACEMENT Right 07/11/2016   Procedure: CYSTOSCOPY WITH RETROGRADE PYELOGRAM, URETEROSCOPY, BASKET STONE EXTRACTION  AND STENT  PLACEMENT;  Surgeon: Sebastian Ache, MD;  Location: [facility];  Service: Urology;  Laterality: Right;  45 MINS  C-ARM DIGITAL URETEROSCOPE HOLMIUM LASER  . ESOPHAGOGASTRODUODENOSCOPY      There were no vitals filed for this visit.  Subjective Assessment - 06/06/20 0802    Subjective  Patient reports that still feels the same. Reports he was doing alot yesterday in regards to bending over, reports was on his knee for approx 5 minutes, and then the symptoms hit him upon standing. Reports that went to [facility], changed migraine medication but does not believe it is making a difference.    Pertinent History  BPH, prediabetes, dizziness, hypertension, hyperlipidemia, Sleep Apnea, Depression, Obesity    Diagnostic tests  results of MRI: cervical and lumbar spine unremarkable    Currently in Pain?  No/denies                        [facility] Adult PT Treatment/Exercise - 06/06/20 0001      High Level Balance   High Level Balance Activities  Head turns    High Level Balance Comments  Completed forward gait with head turns, vertical x 2 laps, horizontal x 2 laps. After end of vertical head turns patient reports mild dizziness, horizontal  also increased dizziness. Pt reports dizziness is worse with horizontal>vertical.        Vestibular Treatment/Exercise - 06/06/20 0001      Vestibular Treatment/Exercise   Vestibular Treatment Provided  Habituation      180 degree Turns   Number of Reps   6    Symptom Description   completed 180 deg turns on blue mat with 2-3 steps prior to turn. Minimal dizziness with completion      360 degree Turns   Number of Reps   4    Symptom Description   Moderate dizziness, 2-3 steps between turns. At end of turn patient has feet posterior, and reports that he feels as if his body wants to to continue to turn.          Balance Exercises - 06/06/20 0001      Balance Exercises: Standing   Tandem Stance  Eyes open;Intermittent upper  extremity support;3 reps;30 secs    Balance Beam  Completed static standing on balance beam w/ EO, 3 x 1 min with focus on holding steady. Increased posterior sway noted. Completed fwd toe taps 2 x 10 reps on balance beam to promote ankle strategies and forward weight shift. CGA throughout.     Tandem Gait  Forward;3 reps;Limitations    Tandem Gait Limitations  completed in // bars, with intermittent CGA as needed. x 3 laps          PT Short Term Goals - 05/30/20 1047      PT SHORT TERM GOAL #1   Title  Patient will be independent with initial HEP    Baseline  Pt verbalizes indepedence    Time  3    Period  Weeks    Status  Achieved    Target Date  05/25/20      PT SHORT TERM GOAL #2   Title  FGA to be assessed and LTG set as appropriate    Baseline  LTG written.    Time  3    Period  Weeks    Status  Achieved    Target Date  05/25/20        PT Long Term Goals - 05/04/20 1008      PT LONG TERM GOAL #1   Title  Patient will be independent with final HEP    Time  6    Period  Weeks    Status  New    Target Date  06/15/20      PT LONG TERM GOAL #2   Title  Patient will improve gait speed >/= 3.5 ft/sec to demonstrate improved mobility    Baseline  2.98 ft/sec    Time  6    Period  Weeks    Status  New    Target Date  06/15/20      PT LONG TERM GOAL #3   Title  Patient will completed TUG in </= 10 secs to demonstrate reduced risk for falls.    Baseline  11.91    Time  6    Period  Weeks    Status  New    Target Date  06/15/20      PT LONG TERM GOAL #4   Title  Pt will report at least 50% improvement in symptoms regarding dizziness and headaches    Time  6    Period  Weeks    Status  New    Target Date  06/15/20      PT LONG  TERM GOAL #5   Title  Patient will improve FGA by 5 points from baseline to demonstrate improved balance and reduced risk for falls    Time  6    Period  Weeks    Status  New    Target Date  06/15/20            Plan -  06/06/20 0845    Clinical Impression Statement  Today's skilled session included further balance training on complaint surfaces and tandem targeting static balance and ankle strategies. Patient conitnues to demo increased dizziness with 360>180 deg turns, and horizontal head turns > vertical head turns. Pt will continue to benefit from skilled PT services to progress toward all unmet goals.    Personal Factors and Comorbidities  Comorbidity 3+;Time since onset of injury/illness/exacerbation    Comorbidities  BPH, prediabetes, dizziness, hypertension, hyperlipidemia, Sleep Apnea, Depression, Obesity    Examination-Activity Limitations  Bend;Lift    Examination-Participation Restrictions  Driving;Other   Work   Conservation officer, historic buildings  Evolving/Moderate complexity    Rehab Potential  Good    PT Frequency  2x / week    PT Duration  6 weeks    PT Treatment/Interventions  ADLs/Self Care Home Management;Canalith Repostioning;Cryotherapy;Moist Heat;Gait training;Stair training;Functional mobility training;Therapeutic activities;Therapeutic exercise;Balance training;Neuromuscular re-education;Patient/family education;Vestibular    PT Next Visit Plan  Continue balance and vestibular (focus on visual, vestibular system), Habituation exercises. Exercises focused on shifting weigth anterior due to keeping weight posterior with standing    Consulted and Agree with Plan of Care  Patient       Patient will benefit from skilled therapeutic intervention in order to improve the following deficits and impairments:  Abnormal gait, Decreased balance, Decreased mobility, Difficulty walking, Impaired vision/preception, Dizziness, Decreased cognition, Decreased activity tolerance, Decreased strength, Pain  Visit Diagnosis: Unsteadiness on feet  Other abnormalities of gait and mobility  Muscle weakness (generalized)  Dizziness and giddiness     Problem List Patient Active Problem List   Diagnosis  Date Noted  . Obstructive sleep apnea hypopnea, severe 03/16/2020  . 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  . Excessive daytime sleepiness 02/13/2020  . Chronic fatigue 02/13/2020  . BPH with obstruction/lower urinary tract symptoms   . Prediabetes 10/11/2018  . Former smoker 10/11/2018  . Family history of ischemic heart disease 10/11/2018  . FH: hypertension 03/23/2018  . Crushing injury of left elbow and forearm 07/13/2015  . 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 Medication management 02/08/2014  . Essential hypertension   . Hyperlipidemia, mixed   . OSA on CPAP   . Abnormal glucose   . Morbid obesity (HCC)   . Vitamin D deficiency     Tempie Donning, PT, DPT 06/06/2020, 8:48 AM  [facility] [address] [place], [place], [address] Phone: [phone]   Fax:  [phone]  Name: Shane Saunders MRN: [id number] Date of Birth: [birth year]

## 2020-06-08 ENCOUNTER — Ambulatory Visit: Payer: BC Managed Care – PPO

## 2020-06-08 ENCOUNTER — Other Ambulatory Visit: Payer: Self-pay

## 2020-06-08 DIAGNOSIS — R2689 Other abnormalities of gait and mobility: Secondary | ICD-10-CM | POA: Diagnosis not present

## 2020-06-08 DIAGNOSIS — R42 Dizziness and giddiness: Secondary | ICD-10-CM

## 2020-06-08 DIAGNOSIS — M6281 Muscle weakness (generalized): Secondary | ICD-10-CM

## 2020-06-08 DIAGNOSIS — R2681 Unsteadiness on feet: Secondary | ICD-10-CM

## 2020-06-08 NOTE — Therapy (Addendum)
[facility] [address] [place], [place], [address] Phone: [phone]   Fax:  [phone]  Physical Therapy Treatment  Patient Details  Name: Shane Saunders MRN: [id number] Date of Birth: 02-24-1971 Referring Provider (PT): Andrey Spearman, MD   Encounter Date: 06/08/2020   PT End of Session - 06/08/20 0855    Visit Number 9    Number of Visits 13    Date for PT Re-Evaluation 07/03/20   POC for 6 weeks, Cert for 60 days   Authorization Type [employer]    PT Start Time [phone]    PT Stop Time 0930    PT Time Calculation (min) 43 min    Equipment Utilized During Treatment Gait belt    Activity Tolerance Patient tolerated treatment well    Behavior During Therapy [facility] for tasks assessed/performed           Past Medical History:  Diagnosis Date  . Abnormal glucose   . Borderline hypertension   . BPH (benign prostatic hypertrophy)   . Crushing injury of left elbow and forearm 07/13/2015  . Depression, major, in partial remission ([place]) 07/12/2015  . Dizziness   . Hyperlipidemia   . Hypertension   . Hypogonadism male   . 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 Morbid obesity ([place])   . Obesity (BMI 30-39.9)   . OSA on CPAP   . Prediabetes   . Right ureteral stone   . Sleep apnea    C PAP  . Vertigo   . Vitamin D deficiency   . Wears glasses     Past Surgical History:  Procedure Laterality Date  . 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 ARTERY REPAIR Right 05/31/2014   Procedure: IRRIGATION, EXPLORATION, AND REPAIR OF RIGHT ARM WOUND;  Surgeon: Gwenyth Ober, MD;  Location: [facility];  Service: General;  Laterality: Right;  . CYSTOSCOPY WITH RETROGRADE PYELOGRAM, URETEROSCOPY AND STENT PLACEMENT Right 07/11/2016   Procedure: CYSTOSCOPY WITH RETROGRADE PYELOGRAM, URETEROSCOPY, BASKET STONE EXTRACTION  AND STENT PLACEMENT;   Surgeon: Alexis Frock, MD;  Location: [facility];  Service: Urology;  Laterality: Right;  45 MINS  C-ARM DIGITAL URETEROSCOPE HOLMIUM LASER  . ESOPHAGOGASTRODUODENOSCOPY      There were no vitals filed for this visit.   Subjective Assessment - 06/08/20 0849    Subjective Patient reports just has a mild headache this morning. Patient reports helping his wife, and still noticed some difficulty with coming back up to standing.    Pertinent History BPH, prediabetes, dizziness, hypertension, hyperlipidemia, Sleep Apnea, Depression, Obesity    Diagnostic tests results of MRI: cervical and lumbar spine unremarkable    Currently in Pain? No/denies                   Vestibular Assessment - 06/08/20 0001      Orthostatics   BP sitting 110/70    HR sitting 70    BP standing (after 1 minute) 107/65    HR standing (after 1 minute) 78    Orthostatics Comment Due to patients report, patient positioned in kneeling position for 5 minutes on red mat, and assessed BP upon rising to simulate patient's reports of increased dizziness. BP response upon standing was: 93/60, HR: 81. Patient reports increased dizziness upon standing with completion of this today.  [facility] Adult PT Treatment/Exercise - 06/08/20 0001      High Level Balance   High Level Balance Comments Completed squats with 15# kettlebell focus on bending down to get and back up into standing x 15 reps. With monitoring symptoms of dizziness, no increased dizziness reported.       Neuro Re-ed    Neuro Re-ed Details  Completed gait with 1# weighted ball, including tracking ball with eyes in shape of "V", "T", and "X". Patient reporting increased dizziness with diagonals. Pt completed self ball toss x 2 laps, with patient tracking ball with eyes as tossed. Completed x 4 laps, with increased dizziness noted at end of completion. Due to patient reports of difficulty with dizziness from a  kneeling position to standing, completed x 5 reps for further habituation training.                   PT Education - 06/08/20 1322    Education Details Educated on Armed forces logistics/support/administrative officer for orthostatics.    Person(s) Educated Patient    Methods Explanation    Comprehension Verbalized understanding            PT Short Term Goals - 05/30/20 1047      PT SHORT TERM GOAL #1   Title Patient will be independent with initial HEP    Baseline Pt verbalizes indepedence    Time 3    Period Weeks    Status Achieved    Target Date 05/25/20      PT SHORT TERM GOAL #2   Title FGA to be assessed and LTG set as appropriate    Baseline LTG written.    Time 3    Period Weeks    Status Achieved    Target Date 05/25/20             PT Long Term Goals - 05/04/20 1008      PT LONG TERM GOAL #1   Title Patient will be independent with final HEP    Time 6    Period Weeks    Status New    Target Date 06/15/20      PT LONG TERM GOAL #2   Title Patient will improve gait speed >/= 3.5 ft/sec to demonstrate improved mobility    Baseline 2.98 ft/sec    Time 6    Period Weeks    Status New    Target Date 06/15/20      PT LONG TERM GOAL #3   Title Patient will completed TUG in </= 10 secs to demonstrate reduced risk for falls.    Baseline 11.91    Time 6    Period Weeks    Status New    Target Date 06/15/20      PT LONG TERM GOAL #4   Title Pt will report at least 50% improvement in symptoms regarding dizziness and headaches    Time 6    Period Weeks    Status New    Target Date 06/15/20      PT LONG TERM GOAL #5   Title Patient will improve FGA by 5 points from baseline to demonstrate improved balance and reduced risk for falls    Time 6    Period Weeks    Status New    Target Date 06/15/20                 Plan - 06/08/20 1254    Clinical Impression Statement Due to patients reports of  increased dizziness upon standing from a kneeling positioned, completed  further assessment of orthostatics. From kneeling (for 5 minutes) to standing, patient did demonstrate a significant drop in BP along with reports of dizziness. Educated patient on compensation methods for this to avoid dizziness and/or loss of balance. Finished session with completion of balance exercises to further challenge patient. Pt will continue to benefit from skilled PT services to address deficits and progress toward goals.    Personal Factors and Comorbidities Comorbidity 3+;Time since onset of injury/illness/exacerbation    Comorbidities BPH, prediabetes, dizziness, hypertension, hyperlipidemia, Sleep Apnea, Depression, Obesity    Examination-Activity Limitations Bend;Lift    Examination-Participation Restrictions Driving;Other   Work   Conservation officer, historic buildings Evolving/Moderate complexity    Rehab Potential Good    PT Frequency 2x / week    PT Duration 6 weeks    PT Treatment/Interventions ADLs/Self Care Home Management;Canalith Repostioning;Cryotherapy;Moist Heat;Gait training;Stair training;Functional mobility training;Therapeutic activities;Therapeutic exercise;Balance training;Neuromuscular re-education;Patient/family education;Vestibular    PT Next Visit Plan Check goals. Recert? Balance master    Consulted and Agree with Plan of Care Patient           Patient will benefit from skilled therapeutic intervention in order to improve the following deficits and impairments:  Abnormal gait, Decreased balance, Decreased mobility, Difficulty walking, Impaired vision/preception, Dizziness, Decreased cognition, Decreased activity tolerance, Decreased strength, Pain  Visit Diagnosis: Unsteadiness on feet  Other abnormalities of gait and mobility  Muscle weakness (generalized)  Dizziness and giddiness     Problem List Patient Active Problem List   Diagnosis Date Noted  . Obstructive sleep apnea hypopnea, severe 03/16/2020  . 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  .  Excessive daytime sleepiness 02/13/2020  . Chronic fatigue 02/13/2020  . BPH with obstruction/lower urinary tract symptoms   . Prediabetes 10/11/2018  . Former smoker 10/11/2018  . Family history of ischemic heart disease 10/11/2018  . FH: hypertension 03/23/2018  . Crushing injury of left elbow and forearm 07/13/2015  . 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 Medication management 02/08/2014  . Essential hypertension   . Hyperlipidemia, mixed   . OSA on CPAP   . Abnormal glucose   . Morbid obesity (HCC)   . Vitamin D deficiency     Tempie Donning, PT, DPT 06/08/2020, 1:24 PM  [facility] [address] [place], [place], [address] Phone: [phone]   Fax:  [phone]  Name: Shane Saunders MRN: [id number] Date of Birth: 29-Sep-1971

## 2020-06-11 ENCOUNTER — Telehealth: Payer: Self-pay

## 2020-06-11 NOTE — Telephone Encounter (Signed)
Received fax from [facility] [employer]: Aimovig approved 06/11/20 - 06/11/21. Approval letter faxed to pharmacy, sent patient my chart to advise.

## 2020-06-11 NOTE — Telephone Encounter (Signed)
A new PA has been sent in via [facility]   Key: [id number]) Rx #: [id number] Aimovig 140MG /ML auto-injectors   Form [employer] Electronic PA Form (2017 NCPDP)  G43.109, avoid topiramate (due to history of kidney stone; left renal lesion?), avoidpropranolol(due to history of depression), avoidamitriptyline(already on lexapro, bupropion).

## 2020-06-13 ENCOUNTER — Ambulatory Visit: Payer: BC Managed Care – PPO

## 2020-06-13 ENCOUNTER — Other Ambulatory Visit: Payer: Self-pay

## 2020-06-13 DIAGNOSIS — M6281 Muscle weakness (generalized): Secondary | ICD-10-CM

## 2020-06-13 DIAGNOSIS — R2681 Unsteadiness on feet: Secondary | ICD-10-CM

## 2020-06-13 DIAGNOSIS — R2689 Other abnormalities of gait and mobility: Secondary | ICD-10-CM

## 2020-06-13 DIAGNOSIS — R42 Dizziness and giddiness: Secondary | ICD-10-CM

## 2020-06-13 NOTE — Therapy (Signed)
[facility] [address] [place], [place], [address] Phone: [phone]   Fax:  [phone]  Physical Therapy Treatment  Patient Details  Name: Shane Saunders MRN: [id number] Date of Birth: May 14, 1971 Referring Provider (PT): Joycelyn Schmid, MD   Encounter Date: 06/13/2020   PT End of Session - 06/13/20 0853    Visit Number 10    Number of Visits 13    Date for PT Re-Evaluation 07/03/20   POC for 6 weeks, Cert for 60 days   Authorization Type BCBS    PT Start Time 0845    PT Stop Time 0930    PT Time Calculation (min) 45 min    Equipment Utilized During Treatment Gait belt    Activity Tolerance Patient tolerated treatment well    Behavior During Therapy [facility] for tasks assessed/performed           Past Medical History:  Diagnosis Date  . Abnormal glucose   . Borderline hypertension   . BPH (benign prostatic hypertrophy)   . Crushing injury of left elbow and forearm 07/13/2015  . 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 Dizziness   . Hyperlipidemia   . Hypertension   . Hypogonadism male   . 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 Morbid obesity (HCC)   . Obesity (BMI 30-39.9)   . OSA on CPAP   . Prediabetes   . Right ureteral stone   . Sleep apnea    C PAP  . Vertigo   . Vitamin D deficiency   . Wears glasses     Past Surgical History:  Procedure Laterality Date  . 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 ARTERY REPAIR Right 05/31/2014   Procedure: IRRIGATION, EXPLORATION, AND REPAIR OF RIGHT ARM WOUND;  Surgeon: Cherylynn Ridges, MD;  Location: [facility];  Service: General;  Laterality: Right;  . CYSTOSCOPY WITH RETROGRADE PYELOGRAM, URETEROSCOPY AND STENT PLACEMENT Right 07/11/2016   Procedure: CYSTOSCOPY WITH RETROGRADE PYELOGRAM, URETEROSCOPY, BASKET STONE EXTRACTION  AND STENT  PLACEMENT;  Surgeon: Sebastian Ache, MD;  Location: [facility];  Service: Urology;  Laterality: Right;  45 MINS  C-ARM DIGITAL URETEROSCOPE HOLMIUM LASER  . ESOPHAGOGASTRODUODENOSCOPY      There were no vitals filed for this visit.   Subjective Assessment - 06/13/20 0847    Subjective Patient reports that he is feeling about the same. Has a mild headache again today. Patient reports that he continued to have dizziness when moving from his knees to standing. Patient reports that he swam for approx 30 minutes yesterday at [facility], reports he had some dizziness when going down under the water. Reports this dizziness felt similar.    Pertinent History BPH, prediabetes, dizziness, hypertension, hyperlipidemia, Sleep Apnea, Depression, Obesity    Diagnostic tests results of MRI: cervical and lumbar spine unremarkable    Currently in Pain? No/denies                             [facility] Adult PT Treatment/Exercise - 06/13/20 0001      Ambulation/Gait   Ambulation/Gait Yes    Ambulation/Gait Assistance 5: Supervision    Ambulation/Gait Assistance Details throughout therapy gym with activities    Assistive device None    Gait Pattern Step-through pattern;Wide base of support  Ambulation Surface Level;Indoor      Neuro Re-ed    Neuro Re-ed Details  Completed standing with 1# weighted ball completing circles with visual tracking. Patient completed x 10 reps of clockwise with moderate dizziness. Then completed counterclockwise x 10 reps. Patient demo increased dizziness and unsteadiness with both, but counterclockwise seemed to be more difficulty for paitent. CGA intermittently as needed. In standing completed forward reaching activity to promote anterior weight shift, after patient touches target working on using ankle strategy to return to neutral alignment without posterior LOB. Completed x 15 reps in anterior direction, completed x 10 in right - anterior and left -  anterior direction. Patient demonstrates decreased limits of stability with completion.            Vestibular Treatment/Exercise - 06/13/20 0942      Vestibular Treatment/Exercise   Vestibular Treatment Provided Habituation;Gaze    Habituation Exercises 360 degree Turns    Gaze Exercises X1 Viewing Horizontal;X1 Viewing Vertical      360 degree Turns   Number of Reps  10   2 x 5   Symptom Description  Completed initially with 3 steps between turns with mild dizziness x 5 reps. Then progressed to 2 steps between turns to increase frequency. Pt demo moderate dizziness and require CGA for steadying at times due to unsteadiness. Patient reports that after completing turn and standing still, his body feels as it continues to turn.       X1 Viewing Horizontal   Foot Position Standing w/ Feet Apart, Progressed to Feet Together on Firm Surface    Time --   1 min   Reps 4    Comments Completed 2 x 1 min with feet apart, and 2 x 1 min each with feet together. Pt continue to demo weight posteriorly on heels throughout completion.       X1 Viewing Vertical   Foot Position Standing w/ Feet Apart    Time --   1 min   Reps 2    Comments Completed 2 x 1 min each, symptoms mild with vertical, however still demo weight posteriorly.                   PT Education - 06/13/20 0951    Education Details Educated to complete 360 turns with HEP (3 steps between turns)    Person(s) Educated Patient    Methods Explanation    Comprehension Verbalized understanding            PT Short Term Goals - 05/30/20 1047      PT SHORT TERM GOAL #1   Title Patient will be independent with initial HEP    Baseline Pt verbalizes indepedence    Time 3    Period Weeks    Status Achieved    Target Date 05/25/20      PT SHORT TERM GOAL #2   Title FGA to be assessed and LTG set as appropriate    Baseline LTG written.    Time 3    Period Weeks    Status Achieved    Target Date 05/25/20              PT Long Term Goals - 05/04/20 1008      PT LONG TERM GOAL #1   Title Patient will be independent with final HEP    Time 6    Period Weeks    Status New    Target Date 06/15/20  PT LONG TERM GOAL #2   Title Patient will improve gait speed >/= 3.5 ft/sec to demonstrate improved mobility    Baseline 2.98 ft/sec    Time 6    Period Weeks    Status New    Target Date 06/15/20      PT LONG TERM GOAL #3   Title Patient will completed TUG in </= 10 secs to demonstrate reduced risk for falls.    Baseline 11.91    Time 6    Period Weeks    Status New    Target Date 06/15/20      PT LONG TERM GOAL #4   Title Pt will report at least 50% improvement in symptoms regarding dizziness and headaches    Time 6    Period Weeks    Status New    Target Date 06/15/20      PT LONG TERM GOAL #5   Title Patient will improve FGA by 5 points from baseline to demonstrate improved balance and reduced risk for falls    Time 6    Period Weeks    Status New    Target Date 06/15/20                 Plan - 06/13/20 2993    Clinical Impression Statement Today's skilled PT session included further habituation and gaze training during session. Patient demonstrating increased difficulty with clockwise/counterclockwise visual tracking activity with mild-mod dizziness. Able to progress VOR in standing with feet together, and progress 360 turns to 2-3 steps between. Pt will continue to benefit from skilled PT services to progress toward goals.    Personal Factors and Comorbidities Comorbidity 3+;Time since onset of injury/illness/exacerbation    Comorbidities BPH, prediabetes, dizziness, hypertension, hyperlipidemia, Sleep Apnea, Depression, Obesity    Examination-Activity Limitations Bend;Lift    Examination-Participation Restrictions Driving;Other   Work   [employer] Evolving/Moderate complexity    Rehab Potential Good    PT Frequency 2x / week    PT Duration 6  weeks    PT Treatment/Interventions ADLs/Self Care Home Management;Canalith Repostioning;Cryotherapy;Moist Heat;Gait training;Stair training;Functional mobility training;Therapeutic activities;Therapeutic exercise;Balance training;Neuromuscular re-education;Patient/family education;Vestibular    PT Next Visit Plan Check Goals? ReCert?    Consulted and Agree with Plan of Care Patient           Patient will benefit from skilled therapeutic intervention in order to improve the following deficits and impairments:  Abnormal gait, Decreased balance, Decreased mobility, Difficulty walking, Impaired vision/preception, Dizziness, Decreased cognition, Decreased activity tolerance, Decreased strength, Pain  Visit Diagnosis: Unsteadiness on feet  Other abnormalities of gait and mobility  Muscle weakness (generalized)  Dizziness and giddiness     Problem List Patient Active Problem List   Diagnosis Date Noted  . Obstructive sleep apnea hypopnea, severe 03/16/2020  . 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  . Excessive daytime sleepiness 02/13/2020  . Chronic fatigue 02/13/2020  . BPH with obstruction/lower urinary tract symptoms   . Prediabetes 10/11/2018  . Former smoker 10/11/2018  . Family history of ischemic heart disease 10/11/2018  . FH: hypertension 03/23/2018  . Crushing injury of left elbow and forearm 07/13/2015  . Depression, major, in partial remission ([place]) 07/12/2015  . Medication management 02/08/2014  . Essential hypertension   . Hyperlipidemia, mixed   . OSA on CPAP   . Abnormal glucose   . Morbid obesity ([place])   . Vitamin D deficiency     Jones Bales, PT, DPT 06/13/2020, 9:57 AM  [facility] [address] [place], [place], [address] Phone: [phone]   Fax:  [phone]  Name: Shane Saunders MRN: [id number] Date of Birth: [birth year]

## 2020-06-15 ENCOUNTER — Telehealth: Payer: Self-pay

## 2020-06-15 ENCOUNTER — Other Ambulatory Visit: Payer: Self-pay

## 2020-06-15 ENCOUNTER — Ambulatory Visit: Payer: BC Managed Care – PPO

## 2020-06-15 DIAGNOSIS — M6281 Muscle weakness (generalized): Secondary | ICD-10-CM

## 2020-06-15 DIAGNOSIS — R2689 Other abnormalities of gait and mobility: Secondary | ICD-10-CM | POA: Diagnosis not present

## 2020-06-15 DIAGNOSIS — R42 Dizziness and giddiness: Secondary | ICD-10-CM

## 2020-06-15 DIAGNOSIS — M542 Cervicalgia: Secondary | ICD-10-CM

## 2020-06-15 DIAGNOSIS — R2681 Unsteadiness on feet: Secondary | ICD-10-CM

## 2020-06-15 NOTE — Therapy (Addendum)
[facility] [address] [place], [place], [address] Phone: [phone]   Fax:  [phone]  Physical Therapy Treatment/ Re-Certification  Patient Details  Name: Shane Saunders MRN: [id number] Date of Birth: [birth year] Referring Provider (PT): Andrey Spearman, MD   Encounter Date: 06/15/2020   PT End of Session - 06/15/20 0849    Visit Number 11    Number of Visits 23    Date for PT Re-Evaluation 51/02/58   new cert - POC 6 weeks, cert 90 days   Authorization Type BCBS    PT Start Time 0845    PT Stop Time 0930    PT Time Calculation (min) 45 min    Equipment Utilized During Treatment Gait belt    Activity Tolerance Patient tolerated treatment well    Behavior During Therapy [facility] for tasks assessed/performed           Past Medical History:  Diagnosis Date  . Abnormal glucose   . Borderline hypertension   . BPH (benign prostatic hypertrophy)   . Crushing injury of left elbow and forearm 07/13/2015  . Depression, major, in partial remission ([place]) 07/12/2015  . Dizziness   . Hyperlipidemia   . Hypertension   . Hypogonadism male   . 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 Morbid obesity ([place])   . Obesity (BMI 30-39.9)   . OSA on CPAP   . Prediabetes   . Right ureteral stone   . Sleep apnea    C PAP  . Vertigo   . Vitamin D deficiency   . Wears glasses     Past Surgical History:  Procedure Laterality Date  . 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 ARTERY REPAIR Right 05/31/2014   Procedure: IRRIGATION, EXPLORATION, AND REPAIR OF RIGHT ARM WOUND;  Surgeon: Gwenyth Ober, MD;  Location: [place];  Service: General;  Laterality: Right;  . CYSTOSCOPY WITH RETROGRADE PYELOGRAM, URETEROSCOPY AND STENT PLACEMENT Right 07/11/2016   Procedure: CYSTOSCOPY WITH RETROGRADE PYELOGRAM, URETEROSCOPY, BASKET STONE EXTRACTION   AND STENT PLACEMENT;  Surgeon: Alexis Frock, MD;  Location: [facility];  Service: Urology;  Laterality: Right;  45 MINS  C-ARM DIGITAL URETEROSCOPE HOLMIUM LASER  . ESOPHAGOGASTRODUODENOSCOPY      There were no vitals filed for this visit.   Subjective Assessment - 06/15/20 0847    Subjective Patient reports doing well this morning. No reports of dizziness currently, reports headache is mild at the moment if anything present. Patietn reports 360 deg turns still increase symptoms.    Pertinent History BPH, prediabetes, dizziness, hypertension, hyperlipidemia, Sleep Apnea, Depression, Obesity    Diagnostic tests results of MRI: cervical and lumbar spine unremarkable    Currently in Pain? No/denies              [facility] PT Assessment - 06/15/20 0857      Assessment   Medical Diagnosis Gait Difficulty/Dizziness    Referring Provider (PT) Andrey Spearman, MD      Functional Gait  Assessment   Gait assessed  Yes    Gait Level Surface Walks 20 ft in less than 7 sec but greater than 5.5 sec, uses assistive device, slower speed, mild gait deviations, or deviates 6-10 in outside of the 12 in walkway width.    Change in Gait Speed Able to smoothly change walking speed without loss of  balance or gait deviation. Deviate no more than 6 in outside of the 12 in walkway width.    Gait with Horizontal Head Turns Performs head turns smoothly with slight change in gait velocity (eg, minor disruption to smooth gait path), deviates 6-10 in outside 12 in walkway width, or uses an assistive device.    Gait with Vertical Head Turns Performs task with slight change in gait velocity (eg, minor disruption to smooth gait path), deviates 6 - 10 in outside 12 in walkway width or uses assistive device    Gait and Pivot Turn Pivot turns safely within 3 sec and stops quickly with no loss of balance.    Step Over Obstacle Is able to step over 2 stacked shoe boxes taped together (9 in total height)  without changing gait speed. No evidence of imbalance.    Gait with Narrow Base of Support Ambulates less than 4 steps heel to toe or cannot perform without assistance.    Gait with Eyes Closed Walks 20 ft, uses assistive device, slower speed, mild gait deviations, deviates 6-10 in outside 12 in walkway width. Ambulates 20 ft in less than 9 sec but greater than 7 sec.    Ambulating Backwards Walks 20 ft, uses assistive device, slower speed, mild gait deviations, deviates 6-10 in outside 12 in walkway width.    Steps Alternating feet, must use rail.    Total Score 21    FGA comment: 21/30                         [facility] Adult PT Treatment/Exercise - 06/15/20 0001      Ambulation/Gait   Ambulation/Gait Yes    Ambulation/Gait Assistance 5: Supervision    Assistive device None    Gait Pattern Step-through pattern;Wide base of support    Ambulation Surface Level;Indoor    Gait velocity 9.16 secs = 3.58 ft/sec      Standardized Balance Assessment   Standardized Balance Assessment Timed Up and Go Test      Timed Up and Go Test   TUG Normal TUG    Normal TUG (seconds) 10.23      Self-Care   Self-Care Other Self-Care Comments    Other Self-Care Comments  PT spent time educating patient on dizziness/migraines and multifactorial component that could be feeding into the current symptoms experiencing. PT educating on vestibular migraines as well as motion senstivity. PT also speaking with patient about obtaining Speech Therapy referral due to patient reports of difficulty finding words, and brain fog. Patient reporting he would like to be evaluated by speech therapy for further assessment.                   PT Education - 06/15/20 0946    Education Details Educated on progress toward LTG's and continuation of POC. Process for obtaining Speech Therapy order and scheduling evaluation.    Person(s) Educated Patient    Methods Explanation    Comprehension Verbalized  understanding            PT Short Term Goals - 05/30/20 1047      PT SHORT TERM GOAL #1   Title Patient will be independent with initial HEP    Baseline Pt verbalizes indepedence    Time 3    Period Weeks    Status Achieved    Target Date 05/25/20      PT SHORT TERM GOAL #2   Title FGA to be assessed and  LTG set as appropriate    Baseline LTG written.    Time 3    Period Weeks    Status Achieved    Target Date 05/25/20             PT Long Term Goals - 06/15/20 0849      PT LONG TERM GOAL #1   Title Patient will be independent with final HEP    Baseline Patient reports independence with HEP, continue to progress.    Time 6    Period Weeks    Status On-going      PT LONG TERM GOAL #2   Title Patient will improve gait speed >/= 3.5 ft/sec to demonstrate improved mobility    Baseline 2.98 ft/sec, 3.58 ft/sec    Time 6    Period Weeks    Status Achieved      PT LONG TERM GOAL #3   Title Patient will completed TUG in </= 10 secs to demonstrate reduced risk for falls.    Baseline 11.91, 10.23    Time 6    Period Weeks    Status On-going      PT LONG TERM GOAL #4   Title Pt will report at least 50% improvement in symptoms regarding dizziness and headaches    Baseline Pt reports minimal to no improvement in headaches, but does report some improvement in dizziness. Unable to determine % change.    Time 6    Period Weeks    Status On-going      PT LONG TERM GOAL #5   Title Patient will improve FGA by 5 points from baseline to demonstrate improved balance and reduced risk for falls    Baseline 18/30 baseline, 21/30 on 6/18    Time 6    Period Weeks    Status On-going            Updated Short and Long Term Goals:     PT Short Term Goals - 06/15/20 1009      PT SHORT TERM GOAL #1   Title Patient will be independent with vestibular/balance HEP    Time 3    Period Weeks    Status New    Target Date 07/06/20      PT SHORT TERM GOAL #2   Title Pt will  demonstrate ability to perform first 2 conditions of MCTSIB for 30 seconds, with minimal sway to indicate improved sensory integration and improved balance.    Time 3    Period Weeks    Status New    Target Date 07/06/20              PT Long Term Goals - 06/15/20 0849      PT LONG TERM GOAL #1   Title Patient will be independent with final vestibular/balance HEP    Baseline Patient reports independence with HEP, continue to progress.    Time 6    Period Weeks    Status Revised    Target Date 07/27/20      PT LONG TERM GOAL #2   Title Patient will improve DHI by 10 points    Baseline 48/100    Time 6    Period Weeks    Status New    Target Date 07/27/20      PT LONG TERM GOAL #3   Title Patient will completed TUG in </= 10 secs to demonstrate reduced risk for falls    Baseline 11.91, 10.23    Time 6  Period Weeks    Status On-going    Target Date 07/27/20      PT LONG TERM GOAL #4   Title Pt will report at least 50% improvement in symptoms regarding dizziness    Baseline Pt reports minimal to no improvement in headaches, but does report some improvement in dizziness. Unable to determine % change.    Time 6    Period Weeks    Status On-going    Target Date 07/27/20      PT LONG TERM GOAL #5   Title Patient will improve FGA to >/= 23/30 to demonstrate improved balance and reduced risk for falls    Baseline 18/30 baseline, 21/30 on 6/18    Time 6    Period Weeks    Status Revised    Target Date 07/27/20      Additional Long Term Goals   Additional Long Term Goals Yes      PT LONG TERM GOAL #6   Title Pt will demonstrate ability to perform all 4 conditions of MCTSIB for 30 seconds, with minimal sway to indicate improved sensory integration and improved balance.    Time 6    Period Weeks    Status New    Target Date 07/27/20              06/15/20 0950  Plan  Clinical Impression Statement Completed assessment of patient's progress toward LTG's today.  Patient meeting LTG #2 with improvements in gait speed to 3.58 ft/sec. Patient did demonstrate progress toward all other LTG's at this time. Patient verbalizes frustration with symptoms and reports dififuclty with word finding that feels has progressively worsened. PT educated on Speech Therapy, and will place order for referral to allow for further assessment by SLP. Patient has demonstrated improvements in balance with FGA score of 21/30. Patient reports that headaches has had minimal to no improvements, however reports dizziness has improved as it takes more stimulus to provoke symptoms. Patient will continue to benefit from skilled PT services to improve dizziness, improve balance, and maximize functional mobility.  Personal Factors and Comorbidities Comorbidity 3+;Time since onset of injury/illness/exacerbation  Comorbidities BPH, prediabetes, dizziness, hypertension, hyperlipidemia, Sleep Apnea, Depression, Obesity  Examination-Activity Limitations Bend;Lift  Examination-Participation Restrictions Driving;Other (Work)  Pt will benefit from skilled therapeutic intervention in order to improve on the following deficits Abnormal gait;Decreased balance;Decreased mobility;Difficulty walking;Impaired vision/preception;Dizziness;Decreased cognition;Decreased activity tolerance;Decreased strength;Pain  Stability/Clinical Decision Making Evolving/Moderate complexity  Rehab Potential Good  PT Frequency 2x / week  PT Duration 6 weeks  PT Treatment/Interventions ADLs/Self Care Home Management;Canalith Repostioning;Cryotherapy;Moist Heat;Gait training;Stair training;Functional mobility training;Therapeutic activities;Therapeutic exercise;Balance training;Neuromuscular re-education;Patient/family education;Vestibular;Dry needling;Manual techniques;Passive range of motion  PT Next Visit Plan Check Cervical ROM and Musculature. Perform M-CSTIB? Continue habituation and gaze exercises.  Consulted and Agree with  Plan of Care Patient        Patient will benefit from skilled therapeutic intervention in order to improve the following deficits and impairments:  Abnormal gait, Decreased balance, Decreased mobility, Difficulty walking, Impaired vision/preception, Dizziness, Decreased cognition, Decreased activity tolerance, Decreased strength, Pain  Visit Diagnosis: Unsteadiness on feet  Other abnormalities of gait and mobility  Muscle weakness (generalized)  Dizziness and giddiness     Problem List Patient Active Problem List   Diagnosis Date Noted  . Obstructive sleep apnea hypopnea, severe 03/16/2020  . 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  . Excessive daytime sleepiness 02/13/2020  . Chronic fatigue 02/13/2020  . BPH with obstruction/lower urinary tract symptoms   . Prediabetes 10/11/2018  .  Former smoker 10/11/2018  . Family history of ischemic heart disease 10/11/2018  . FH: hypertension 03/23/2018  . Crushing injury of left elbow and forearm 07/13/2015  . 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 Medication management 02/08/2014  . Essential hypertension   . Hyperlipidemia, mixed   . OSA on CPAP   . Abnormal glucose   . Morbid obesity (HCC)   . Vitamin D deficiency     Tempie Donning, PT, DPT 06/15/2020, 10:00 AM  [facility] [address] [place], [place], [address] Phone: [phone]   Fax:  [phone]  Name: BIFF RUTIGLIANO MRN: [id number] Date of Birth: 11/30/1971

## 2020-06-15 NOTE — Telephone Encounter (Signed)
Dr. Marjory Lies,  I wanted to follow up with you regarding [name]. After our initial Physical Therapy plan of care, the patient has demonstrated some improvements in his gait, improved balance, and reports mild improvements in dizziness. We have planned to continue PT services for 6 more weeks at this time. However, patient continues to report migraine/headaches that stay relatively constant, as well as difficulty with wording findings and feelings of being in a brain fog. Patient reports that he has been dealing with this for quite some time, but he and his wife has noticed that it has not improved. I am aware his MRI's (Brain/Cervical/Lumbar) were normal. However I wanted to follow up with you regarding his constant symptoms, and see if it would be of benefit from him to be further evaluated by neurology for the deficits he presents with at this time. I have requested an order for Speech Therapy as well.   Thank you for your time,  Adelfa Koh, PT, DPT

## 2020-06-15 NOTE — Telephone Encounter (Signed)
Dr. Marjory Lies, Adael Culbreath is being treated by Physical Therapy at this time. Patient reports and demonstrates difficulty with word finding and "brain fog". The patient would benefit from a Speech Therapy evaluation for these deficits. If you agree, please place an order in [facility] workque in [facility] or fax the order to [phone]. Thank you, Adelfa Koh, PT, DPT  [facility] [address] [place] [place], [place]  [address] Phone:  [phone] Fax:  [phone]

## 2020-06-18 ENCOUNTER — Ambulatory Visit: Payer: BC Managed Care – PPO | Admitting: Cardiovascular Disease

## 2020-06-18 ENCOUNTER — Encounter: Payer: Self-pay | Admitting: Cardiovascular Disease

## 2020-06-18 ENCOUNTER — Other Ambulatory Visit: Payer: Self-pay

## 2020-06-18 DIAGNOSIS — R079 Chest pain, unspecified: Secondary | ICD-10-CM

## 2020-06-18 DIAGNOSIS — R0789 Other chest pain: Secondary | ICD-10-CM | POA: Insufficient documentation

## 2020-06-18 NOTE — Progress Notes (Signed)
Cardiology Office Note:    Date:  06/18/2020   ID:  Shane Saunders, DOB 1971-11-11, MRN [id number]  PCP:  Unk Pinto, MD  Cardiologist:  Mariea Mcmartin  Electrophysiologist:  None   Referring MD: Unk Pinto, MD   Chief Complaint  Patient presents with  . Hyperlipidemia    History of Present Illness:    Shane Saunders is a 49 y.o. male with a hx of chest chest pain or shortness of breath.  He was seen by Pecolia Ades, NP 2 years ago.  I have never met him before. Creatinine has a history of obstructive sleep apnea and uses CPAP.  He has a vitamin D deficiency, hyperlipidemia.  He had another episode of chest pain last week and was evaluated in the emergency room.  He is here today for further follow-up.  Was seen in 2019 for orthostatic hypotension   orthostatic hypotension He reported multiple episodes of orthostatic hypotension and also dizziness while walking.  An event monitor was ordered in April, 2019 revealed only sinus rhythm including sinus bradycardia and sinus tachycardia.  No serious arrhythmias were seen.  frequent episodes of orthostasis   ,  Just last for a second or so . Does not last long enough for him to fall Has issues with balance  Has had issues with motor skills , neuro changes. Has discussed with his primary MD  Went to the ER last week with dyspnea, chest pressure,  Arm tingling, leg tingling. Feels cold chills.  No fever tsh is normal   Has not exercised in several months  Gets headaches with exercise    Becomes fatigues, Worked previously as a Building control surveyor, no longer working .    Has never had syncope   Lipids in Nov, 2020 show Trig level of 406 Has lost 20 + lbs over the past several months    Has some DOE ,  Is very deconditioned.  Admits that he does not sleep well   June 18, 2020:  Shane Saunders is seen back today for follow-up of his episodes of chest discomfort.  These episodes of chest pain last for a second or so.  He has a history  of sleep apnea and wears CPAP. Still having some balance issues,  Occasionally sound orthostatic but not always .  I do agree that his bp is on the low side    Frequent headaches  Is on verapamil but does not know why.   ?  Migraine  Ive asked him to discuss the verapamil with his primary and see if there is another migraine agent that will not lower his BP  Has seen neuro.  Was referred to vestibular rehab.  Has been wearing his CPAP.   Had repeat sleep study and his current CPAP is working well so his headaches are not likely to be coming from OSA .   Past Medical History:  Diagnosis Date  . Abnormal glucose   . Borderline hypertension   . BPH (benign prostatic hypertrophy)   . Crushing injury of left elbow and forearm 07/13/2015  . Depression, major, in partial remission ([place]) 07/12/2015  . Dizziness   . Hyperlipidemia   . Hypertension   . Hypogonadism male   . 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 Morbid obesity ([place])   . Obesity (BMI 30-39.9)   . OSA on CPAP   . Prediabetes   . Right ureteral stone   . Sleep apnea  C PAP  . Vertigo   . Vitamin D deficiency   . Wears glasses     Past Surgical History:  Procedure Laterality Date  . 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 ARTERY REPAIR Right 05/31/2014   Procedure: IRRIGATION, EXPLORATION, AND REPAIR OF RIGHT ARM WOUND;  Surgeon: Gwenyth Ober, MD;  Location: [facility];  Service: General;  Laterality: Right;  . CYSTOSCOPY WITH RETROGRADE PYELOGRAM, URETEROSCOPY AND STENT PLACEMENT Right 07/11/2016   Procedure: CYSTOSCOPY WITH RETROGRADE PYELOGRAM, URETEROSCOPY, BASKET STONE EXTRACTION  AND STENT PLACEMENT;  Surgeon: Alexis Frock, MD;  Location: [facility];  Service: Urology;  Laterality: Right;  45 MINS  C-ARM DIGITAL URETEROSCOPE HOLMIUM LASER  . ESOPHAGOGASTRODUODENOSCOPY      Current  Medications: Current Meds  Medication Sig  . ALPRAZolam (XANAX) 1 MG tablet 1/2 TO 1TAB 2 TO 3 TIMES A DAY ONLY IF NEED FOR ANXIETY. LIMIT 5 DAYS/WK TO AVOID ADDICTION/DEMENTIA  . atorvastatin (LIPITOR) 80 MG tablet TAKE 1 TABLET BY MOUTH EVERY DAY  . buPROPion (WELLBUTRIN XL) 300 MG 24 hr tablet Take 1 tablet every Morning for Mood, Focus & Concentration  . cetirizine-pseudoephedrine (ZYRTEC-D) 5-120 MG tablet Takes 1 tablet 2 x/day as needed - congestion  . Cholecalciferol (VITAMIN D3) 250 MCG (10000 UT) TABS Take 10,000 Units by mouth daily.  . Cyanocobalamin (VITAMIN B 12 PO) Take 1 tablet by mouth daily.  Marland Kitchen doxycycline (VIBRAMYCIN) 100 MG capsule Take 1 capsule 2 x  /day with food for skin Infection  . Erenumab-aooe 140 MG/ML SOAJ Inject 140 mg into the skin every 30 (thirty) days.  Marland Kitchen escitalopram (LEXAPRO) 20 MG tablet Take 1 tablet Daily for Mood  . fexofenadine (ALLEGRA) 60 MG tablet Takes 1 tablet 2 x /day as needed for Allergies  . gabapentin (NEURONTIN) 800 MG tablet Take 1 tablet 3 x day at 8 am, 5 pm & Bedtime for Chronic Pain & Headaches  . nystatin (MYCOSTATIN) 500000 units TABS tablet   . rizatriptan (MAXALT-MLT) 10 MG disintegrating tablet Take 1 tablet (10 mg total) by mouth as needed for migraine. May repeat in 2 hours if needed  . silodosin (RAPAFLO) 8 MG CAPS capsule Take 1 capsule at Bedtime for Prostate  . SUMAtriptan (IMITREX) 100 MG tablet Take 1 tablet (100 mg total) by mouth once as needed for up to 1 dose for migraine. May repeat in 2 hours if headache persists or recurs.  . verapamil (CALAN-SR) 180 MG CR tablet Take 1 tablet Daily with Food for Migraine Prevention     Allergies:   Codeine, Effexor [venlafaxine], Prednisone, Tramadol hcl, and Zinc   Social History   Socioeconomic History  . Marital status: Married    Spouse name: Leafy Ro  . Number of children: 2  . Years of education: 9  . Highest education level: Not on file  Occupational History     Comment: na  Tobacco Use  . Smoking status: Former Smoker    Packs/day: 0.50    Years: 10.00    Pack years: 5.00    Types: Cigarettes    Quit date: 10/27/2009    Years since quitting: 10.6  . Smokeless tobacco: Former Systems developer    Types: Snuff  Vaping Use  . Vaping Use: Never used  Substance and Sexual Activity  . Alcohol use: Yes    Comment: occasional  . Drug use: No  . Sexual activity: Yes  Comment: Vasectomy  Other Topics Concern  . Not on file  Social History Narrative   Lives with wife   Caffeine -tea, 1 daily       Social Determinants of Health   Financial Resource Strain:   . Difficulty of Paying Living Expenses:   Food Insecurity:   . Worried About Charity fundraiser in the Last Year:   . Arboriculturist in the Last Year:   Transportation Needs:   . Film/video editor (Medical):   Marland Kitchen Lack of Transportation (Non-Medical):   Physical Activity:   . Days of Exercise per Week:   . Minutes of Exercise per Session:   Stress:   . Feeling of Stress :   Social Connections:   . Frequency of Communication with Friends and Family:   . Frequency of Social Gatherings with Friends and Family:   . Attends Religious Services:   . Active Member of Clubs or Organizations:   . Attends Archivist Meetings:   Marland Kitchen Marital Status:      Family History: The patient's family history includes CAD in his paternal grandfather; Cancer in his sister; Diabetes in his father; Hyperlipidemia in his father; Hypertension in his father and mother; Migraines in his mother; Thyroid disease in his mother.  ROS:   Please see the history of present illness.     All other systems reviewed and are negative.  EKGs/Labs/Other Studies Reviewed:    The following studies were reviewed today:   EKG:      Recent Labs: 05/07/2020: ALT 30; BUN 13; Creat 1.23; Hemoglobin 15.5; Magnesium 2.2; Platelets CANCELED; Potassium 4.1; Sodium 141; TSH 1.14  Recent Lipid Panel    Component Value  Date/Time   CHOL 184 05/07/2020 1417   TRIG 217 (H) 05/07/2020 1417   HDL 34 (L) 05/07/2020 1417   CHOLHDL 5.4 (H) 05/07/2020 1417   VLDL 34 (H) 05/27/2017 1622   LDLCALC 116 (H) 05/07/2020 1417    Physical Exam:    Physical Exam: Blood pressure 102/64, pulse 64, height '5\' 10"'$  (1.778 m), weight 261 lb 6.4 oz (118.6 kg), SpO2 96 %.  GEN:   Young, obese male,  NAD  HEENT: Normal NECK: No JVD; No carotid bruits LYMPHATICS: No lymphadenopathy CARDIAC: RRR , no murmurs, rubs, gallops RESPIRATORY:  Clear to auscultation without rales, wheezing or rhonchi  ABDOMEN: Soft, non-tender, non-distended MUSCULOSKELETAL:  No edema; No deformity  SKIN: Warm and dry NEUROLOGIC:  Alert and oriented x 3   ASSESSMENT:    No diagnosis found. PLAN:    In order of problems listed above:  Chest pain :  Very atypical.  Does not sound cardiac.  2.   Dizziness:   Symptoms are not c/w orthostasis.   I do think his BP is a bit low for his size and I think he would do better with a higher BP  hes on verapamil for migraines.  Perhaps he can be started on another agent for his migraines.  At this point I do not think that any of his issues are cardiac.  I encouraged him to continue with diet, exercise, weight loss issues.  I will see him on an as-needed basis.   Medication Adjustments/Labs and Tests Ordered: Current medicines are reviewed at length with the patient today.  Concerns regarding medicines are outlined above.  No orders of the defined types were placed in this encounter.  No orders of the defined types were placed in this encounter.  Patient Instructions  Medication Instructions:  Your physician recommends that you continue on your current medications as directed. Please refer to the Current Medication list given to you today.  *If you need a refill on your cardiac medications before your next appointment, please call your pharmacy*   Lab Work: None Ordered If you have labs  (blood work) drawn today and your tests are completely normal, you will receive your results only by: Marland Kitchen MyChart Message (if you have MyChart) OR . A paper copy in the mail If you have any lab test that is abnormal or we need to change your treatment, we will call you to review the results.   Testing/Procedures: None Ordered   Follow-Up: At [facility], you and your health needs are our priority.  As part of our continuing mission to provide you with exceptional heart care, we have created designated Provider Care Teams.  These Care Teams include your primary Cardiologist (physician) and Advanced Practice Providers (APPs -  Physician Assistants and Nurse Practitioners) who all work together to provide you with the care you need, when you need it.   Your next appointment:    As Needed  The format for your next appointment:   Either In Person or Virtual  Provider:   You may see Mertie Moores, MD or one of the following Advanced Practice Providers on your designated Care Team:    Richardson Dopp, PA-C  Robbie Lis, [place]        Signed, Mertie Moores, MD  06/18/2020 8:35 AM    [place]

## 2020-06-18 NOTE — Progress Notes (Signed)
History of Present Illness:      Patient is a 49 yo MWM who returns today for f/u after changing some of his meds for c/o ED & anorgasmia. He was advised to taper his Lexapro 20 mg to 1/2 tab = 10 mg daily and increase his Bupropion 150 XL to 2 tabs = 300 mg daily. He also was rx'd Verapamil to see if it would have any effect with his HA's. Patient is also currently undergoing balance & gait therapy per Dr Marjory Lies. Patient has received # 3 shots of Amovi       Patient felt after tapering his Lexapro, that his "muscle spasms" in his neck & upper abdomen / EG area became worse, so he resumed his previous dose.m He also describes his HA's as Bitemporal / Parietal  And also radiating to his neck and denies any associated Nausea or visual changes.   Medications   Current Outpatient Medications (Cardiovascular):  .  atorvastatin (LIPITOR) 80 MG tablet, TAKE 1 TABLET BY MOUTH EVERY DAY .  verapamil (CALAN-SR) 180 MG CR tablet, Take 1 tablet Daily with Food for Migraine Prevention  Current Outpatient Medications (Respiratory):  .  cetirizine-pseudoephedrine (ZYRTEC-D) 5-120 MG tablet, Takes 1 tablet 2 x/day as needed - congestion .  fexofenadine (ALLEGRA) 60 MG tablet, Takes 1 tablet 2 x /day as needed for Allergies  Current Outpatient Medications (Analgesics):  Marland Kitchen  Erenumab-aooe 140 MG/ML SOAJ, Inject 140 mg into the skin every 30 (thirty) days. .  rizatriptan (MAXALT-MLT) 10 MG disintegrating tablet, Take 1 tablet (10 mg total) by mouth as needed for migraine. May repeat in 2 hours if needed .  SUMAtriptan (IMITREX) 100 MG tablet, Take 1 tablet (100 mg total) by mouth once as needed for up to 1 dose for migraine. May repeat in 2 hours if headache persists or recurs.  Current Outpatient Medications (Hematological):  Marland Kitchen  Cyanocobalamin (VITAMIN B 12 PO), Take 1 tablet by mouth daily.  Current Outpatient Medications (Other):  Marland Kitchen  ALPRAZolam (XANAX) 1 MG tablet, 1/2 TO 1TAB 2 TO 3 TIMES A DAY ONLY  IF NEED FOR ANXIETY. LIMIT 5 DAYS/WK TO AVOID ADDICTION/DEMENTIA .  buPROPion (WELLBUTRIN XL) 300 MG 24 hr tablet, Take 1 tablet every Morning for Mood, Focus & Concentration .  Cholecalciferol (VITAMIN D3) 250 MCG (10000 UT) TABS, Take 10,000 Units by mouth daily. Marland Kitchen  doxycycline (VIBRAMYCIN) 100 MG capsule, Take 1 capsule 2 x  /day with food for skin Infection .  escitalopram (LEXAPRO) 20 MG tablet, Take 1 tablet Daily for Mood .  gabapentin (NEURONTIN) 800 MG tablet, Take 1 tablet 3 x day at 8 am, 5 pm & Bedtime for Chronic Pain & Headaches .  nystatin (MYCOSTATIN) 500000 units TABS tablet,  .  silodosin (RAPAFLO) 8 MG CAPS capsule, Take 1 capsule at Bedtime for Prostate  Problem list He has Essential hypertension; Hyperlipidemia, mixed; OSA on CPAP; Abnormal glucose; Morbid obesity (HCC); Vitamin D deficiency; Medication management; Depression, major, in partial remission ([facility]); Crushing injury of left elbow and forearm; FH: hypertension; Prediabetes; Former smoker; Family history of ischemic heart disease; BPH with obstruction/lower urinary tract symptoms; Excessive daytime sleepiness; Chronic fatigue; Pseudothrombocytopenia; Obstructive sleep apnea hypopnea, severe; and Chest pain of uncertain etiology on their problem list.   Observations/Objective:   BP 116/80   Pulse 76   Temp (!) 97.4 F (36.3 C)   Resp 18   Ht 5\' 11"  (1.803 m)   Wt 259 lb (117.5 kg)  BMI 36.12 kg/m   HEENT - WNL. Neck - supple.  Chest - Clear equal BS. Cor - Nl HS. RRR w/o sig MGR. PP 1(+). No edema. MS- FROM w/o deformities.  Gait Nl. Neuro -  Nl w/o focal abnormalities.  Assessment and Plan:  1. Tension headache  Follow Up Instructions:          I discussed the assessment and treatment plan with the patient. The patient was provided an opportunity to ask questions and all were answered. The patient agreed with the plan and demonstrated an understanding of the instructions.  Kirtland Bouchard,  MD

## 2020-06-18 NOTE — Patient Instructions (Signed)
Medication Instructions:  Your physician recommends that you continue on your current medications as directed. Please refer to the Current Medication list given to you today.  *If you need a refill on your cardiac medications before your next appointment, please call your pharmacy*   Lab Work: None Ordered If you have labs (blood work) drawn today and your tests are completely normal, you will receive your results only by: . MyChart Message (if you have MyChart) OR . A paper copy in the mail If you have any lab test that is abnormal or we need to change your treatment, we will call you to review the results.   Testing/Procedures: None Ordered   Follow-Up: At [facility], you and your health needs are our priority.  As part of our continuing mission to provide you with exceptional heart care, we have created designated Provider Care Teams.  These Care Teams include your primary Cardiologist (physician) and Advanced Practice Providers (APPs -  Physician Assistants and Nurse Practitioners) who all work together to provide you with the care you need, when you need it.   Your next appointment:    As Needed  The format for your next appointment:   Either In Person or Virtual  Provider:   You may see Philip Nahser, MD or one of the following Advanced Practice Providers on your designated Care Team:    Scott Weaver, PA-C  Vin Bhagat, PA-C     

## 2020-06-19 ENCOUNTER — Encounter: Payer: Self-pay | Admitting: Internal Medicine

## 2020-06-19 ENCOUNTER — Ambulatory Visit (INDEPENDENT_AMBULATORY_CARE_PROVIDER_SITE_OTHER): Payer: BC Managed Care – PPO | Admitting: Internal Medicine

## 2020-06-19 VITALS — BP 116/80 | HR 76 | Temp 97.4°F | Resp 18 | Ht 71.0 in | Wt 259.0 lb

## 2020-06-19 DIAGNOSIS — G44209 Tension-type headache, unspecified, not intractable: Secondary | ICD-10-CM | POA: Diagnosis not present

## 2020-06-22 ENCOUNTER — Ambulatory Visit: Payer: BC Managed Care – PPO

## 2020-06-22 ENCOUNTER — Other Ambulatory Visit: Payer: Self-pay

## 2020-06-22 DIAGNOSIS — R2681 Unsteadiness on feet: Secondary | ICD-10-CM

## 2020-06-22 DIAGNOSIS — M6281 Muscle weakness (generalized): Secondary | ICD-10-CM

## 2020-06-22 DIAGNOSIS — M542 Cervicalgia: Secondary | ICD-10-CM

## 2020-06-22 DIAGNOSIS — R2689 Other abnormalities of gait and mobility: Secondary | ICD-10-CM

## 2020-06-22 DIAGNOSIS — R42 Dizziness and giddiness: Secondary | ICD-10-CM

## 2020-06-22 NOTE — Patient Instructions (Signed)
Access Code: [email or web address] URL: [email or web address] Date: 06/22/2020 Prepared by: Jethro Bastos  Exercises Romberg Stance with Eyes Closed - 1 x daily - 7 x weekly - 1 sets - 3 reps - 30 hold Half Tandem Stance Balance with Eyes Closed - 1 x daily - 7 x weekly - 1 sets - 3 reps - 30 hold Standing with Head Rotation - 1 x daily - 7 x weekly - 10 reps - 3 sets Seated Gaze Stabilization with Head Rotation - 1 x daily - 7 x weekly - 10 reps - 3 sets Forward Walking with Half Turns - Slow - 1 x daily - 7 x weekly - 3 sets - 10 reps Wall bumps with ankles - 1 x daily - 7 x weekly - 3 sets - 10 reps Seated Cervical Retraction - 1 x daily - 7 x weekly - 2 sets - 10 reps Seated Assisted Cervical Rotation with Towel - 1 x daily - 7 x weekly - 1 sets - 10 reps - 15 secs hold

## 2020-06-22 NOTE — Therapy (Signed)
[facility] [address] [place], [place], [address] Phone: [phone]   Fax:  [phone]  Physical Therapy Treatment  Patient Details  Name: Shane Saunders MRN: [id number] Date of Birth: 1971/06/25 Referring Provider (PT): Joycelyn Schmid, MD   Encounter Date: 06/22/2020   PT End of Session - 06/22/20 1037    Visit Number 12    Number of Visits 23    Date for PT Re-Evaluation 18/56/31   new cert - POC 6 weeks, cert 90 days   Authorization Type BC[employer]    PT Start Time [phone]    PT Stop Time 0930    PT Time Calculation (min) 44 min    Equipment Utilized During Treatment Gait belt    Activity Tolerance Patient tolerated treatment well    Behavior During Therapy [facility] for tasks assessed/performed           Past Medical History:  Diagnosis Date  . Abnormal glucose   . Borderline hypertension   . BPH (benign prostatic hypertrophy)   . Crushing injury of left elbow and forearm 07/13/2015  . 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 Dizziness   . Hyperlipidemia   . Hypertension   . Hypogonadism male   . 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 Morbid obesity (HCC)   . Obesity (BMI 30-39.9)   . OSA on CPAP   . Prediabetes   . Right ureteral stone   . Sleep apnea    C PAP  . Vertigo   . Vitamin D deficiency   . Wears glasses     Past Surgical History:  Procedure Laterality Date  . 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 ARTERY REPAIR Right 05/31/2014   Procedure: IRRIGATION, EXPLORATION, AND REPAIR OF RIGHT ARM WOUND;  Surgeon: Cherylynn Ridges, MD;  Location: [facility];  Service: General;  Laterality: Right;  . CYSTOSCOPY WITH RETROGRADE PYELOGRAM, URETEROSCOPY AND STENT PLACEMENT Right 07/11/2016   Procedure: CYSTOSCOPY WITH RETROGRADE PYELOGRAM, URETEROSCOPY, BASKET STONE EXTRACTION  AND STENT  PLACEMENT;  Surgeon: Sebastian Ache, MD;  Location: [facility];  Service: Urology;  Laterality: Right;  45 MINS  C-ARM DIGITAL URETEROSCOPE HOLMIUM LASER  . ESOPHAGOGASTRODUODENOSCOPY      There were no vitals filed for this visit.   Subjective Assessment - 06/22/20 0848    Subjective Patient reports that he was reading/filling out paperwork, and reports he was having neck pain and a headache.    Pertinent History BPH, prediabetes, dizziness, hypertension, hyperlipidemia, Sleep Apnea, Depression, Obesity    Diagnostic tests results of MRI: cervical and lumbar spine unremarkable    Currently in Pain? Yes    Pain Score 1     Pain Location Head    Pain Orientation --   around the head   Pain Descriptors / Indicators Headache              [facility] PT Assessment - 06/22/20 1028      AROM   AROM Assessment Site Cervical    Cervical Flexion 27    Cervical Extension 30    Cervical - Right Rotation 39    Cervical - Left Rotation 37                         [facility] Adult PT Treatment/Exercise - 06/22/20 0001  Self-Care   Self-Care Other Self-Care Comments    Other Self-Care Comments  PT educating patient and wife on possible mechanisms behind increased muscle tightness in posterior cervical region and impact posture can play on this. Also educated on muscle tightness, trigger points, and referral patterns.       Exercises   Exercises Other Exercises    Other Exercises  Completed cervical retraction 1 x 10 reps, with PT providing verbal cues for proper completion. Also completed assisted cervical rotation with towel, 1 x 5 reps to R/L, with 15 sec hold at end range. PT providing verbal cues and demonstration for completion. Educated on proper completion and added to HEP. Followed with completion of clockwise and counterclockwise circles with 1# weighted ball, with visual tracking. Pt completed each x 2 minutes, with pt require increased difficulty with  counter clockwise > clockwise.       Manual Therapy   Manual Therapy Soft tissue mobilization    Soft tissue mobilization To upper cervical region, completed STM x 16 mins to bilateral suboccipitals. Increased muscle tension noted in B suboccipitals and B upper trap, and B levator scap. Completed STM with extended pressure on trigger points on suboccipitals. With palpation and STM to subocciptals, patient report referred headache in rams head distribution.                   PT Education - 06/22/20 1036    Education Details Educated on HEP Update (see patient instructions)    Person(s) Educated Patient;Spouse    Methods Explanation;Demonstration;Handout    Comprehension Verbalized understanding            PT Short Term Goals - 06/15/20 1009      PT SHORT TERM GOAL #1   Title Patient will be independent with vestibular/balance HEP    Time 3    Period Weeks    Status New    Target Date 07/06/20      PT SHORT TERM GOAL #2   Title Pt will demonstrate ability to perform first 2 conditions of MCTSIB for 30 seconds, with minimal sway to indicate improved sensory integration and improved balance.    Time 3    Period Weeks    Status New    Target Date 07/06/20             PT Long Term Goals - 06/15/20 0849      PT LONG TERM GOAL #1   Title Patient will be independent with final vestibular/balance HEP    Baseline Patient reports independence with HEP, continue to progress.    Time 6    Period Weeks    Status Revised    Target Date 07/27/20      PT LONG TERM GOAL #2   Title Patient will improve DHI by 10 points    Baseline 48/100    Time 6    Period Weeks    Status New    Target Date 07/27/20      PT LONG TERM GOAL #3   Title Patient will completed TUG in </= 10 secs to demonstrate reduced risk for falls    Baseline 11.91, 10.23    Time 6    Period Weeks    Status On-going    Target Date 07/27/20      PT LONG TERM GOAL #4   Title Pt will report at least  50% improvement in symptoms regarding dizziness    Baseline Pt reports minimal to no improvement in headaches,  but does report some improvement in dizziness. Unable to determine % change.    Time 6    Period Weeks    Status On-going    Target Date 07/27/20      PT LONG TERM GOAL #5   Title Patient will improve FGA to >/= 23/30 to demonstrate improved balance and reduced risk for falls    Baseline 18/30 baseline, 21/30 on 6/18    Time 6    Period Weeks    Status Revised    Target Date 07/27/20      Additional Long Term Goals   Additional Long Term Goals Yes      PT LONG TERM GOAL #6   Title Pt will demonstrate ability to perform all 4 conditions of MCTSIB for 30 seconds, with minimal sway to indicate improved sensory integration and improved balance.    Time 6    Period Weeks    Status New    Target Date 07/27/20                 Plan - 06/22/20 1037    Clinical Impression Statement Today's skilled PT session focused on assessment of patient cervical ROM, wiht patient demo decreased AROM with cervical flexion and bilateral rotation. With palpation patient demo increased muscle tightness in B suboccipitals, B levator scap, and B upper trap. PT completed manual therapy, including STM to the area, with patient reports suboccipital release causing referred headache in rams head distribution. Updated HEP to include exercises at home to help with cervical ROM and posture. Pt will continue to benefit from skilled PT services to progress toward all goals.    Personal Factors and Comorbidities Comorbidity 3+;Time since onset of injury/illness/exacerbation    Comorbidities BPH, prediabetes, dizziness, hypertension, hyperlipidemia, Sleep Apnea, Depression, Obesity    Examination-Activity Limitations Bend;Lift    Examination-Participation Restrictions Driving;Other   Work   Conservation officer, historic buildings Evolving/Moderate complexity    Rehab Potential Good    PT Frequency 2x / week     PT Duration 6 weeks    PT Treatment/Interventions ADLs/Self Care Home Management;Canalith Repostioning;Cryotherapy;Moist Heat;Gait training;Stair training;Functional mobility training;Therapeutic activities;Therapeutic exercise;Balance training;Neuromuscular re-education;Patient/family education;Vestibular;Manual techniques;Dry needling;Passive range of motion    PT Next Visit Plan Complete STM? How has HEP update been. Perform M-CSTIB? Continue habituation and gaze exercises.    Consulted and Agree with Plan of Care Patient           Patient will benefit from skilled therapeutic intervention in order to improve the following deficits and impairments:  Abnormal gait, Decreased balance, Decreased mobility, Difficulty walking, Impaired vision/preception, Dizziness, Decreased cognition, Decreased activity tolerance, Decreased strength, Pain  Visit Diagnosis: Unsteadiness on feet  Other abnormalities of gait and mobility  Muscle weakness (generalized)  Dizziness and giddiness  Cervicalgia     Problem List Patient Active Problem List   Diagnosis Date Noted  . Obstructive sleep apnea hypopnea, severe 03/16/2020  . 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  . Excessive daytime sleepiness 02/13/2020  . Chronic fatigue 02/13/2020  . BPH with obstruction/lower urinary tract symptoms   . Prediabetes 10/11/2018  . Former smoker 10/11/2018  . Family history of ischemic heart disease 10/11/2018  . FH: hypertension 03/23/2018  . Crushing injury of left elbow and forearm 07/13/2015  . 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 Medication management 02/08/2014  . Essential hypertension   . Hyperlipidemia, mixed   . OSA on CPAP   . Abnormal glucose   . Morbid obesity (HCC)   .  Vitamin D deficiency     Jones Bales, PT, DPT 06/22/2020, 10:49 AM  [facility] [address] [place], [place], [address] Phone:  [phone]   Fax:  [phone]  Name: QUINTYN DOMBEK MRN: [id number] Date of Birth: 02-11-1971

## 2020-06-27 ENCOUNTER — Other Ambulatory Visit: Payer: Self-pay

## 2020-06-27 ENCOUNTER — Ambulatory Visit: Payer: BC Managed Care – PPO

## 2020-06-27 DIAGNOSIS — R2681 Unsteadiness on feet: Secondary | ICD-10-CM

## 2020-06-27 DIAGNOSIS — R2689 Other abnormalities of gait and mobility: Secondary | ICD-10-CM

## 2020-06-27 DIAGNOSIS — M6281 Muscle weakness (generalized): Secondary | ICD-10-CM

## 2020-06-27 DIAGNOSIS — R42 Dizziness and giddiness: Secondary | ICD-10-CM

## 2020-06-27 DIAGNOSIS — M542 Cervicalgia: Secondary | ICD-10-CM

## 2020-06-27 NOTE — Therapy (Signed)
[facility] [address] [place], [place], [address] Phone: [phone]   Fax:  [phone]  Physical Therapy Treatment  Patient Details  Name: Shane Saunders MRN: [id number] Date of Birth: 10/05/1971 Referring Provider (PT): Joycelyn Schmid, MD   Encounter Date: 06/27/2020   PT End of Session - 06/27/20 0804    Visit Number 13    Number of Visits 23    Date for PT Re-Evaluation 69/62/95   new cert - POC 6 weeks, cert 90 days   Authorization Type BCBS    PT Start Time 0800    PT Stop Time 0846    PT Time Calculation (min) 46 min    Equipment Utilized During Treatment Gait belt    Activity Tolerance Patient tolerated treatment well    Behavior During Therapy [facility] for tasks assessed/performed           Past Medical History:  Diagnosis Date  . Abnormal glucose   . Borderline hypertension   . BPH (benign prostatic hypertrophy)   . Crushing injury of left elbow and forearm 07/13/2015  . 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 Dizziness   . Hyperlipidemia   . Hypertension   . Hypogonadism male   . 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 Morbid obesity (HCC)   . Obesity (BMI 30-39.9)   . OSA on CPAP   . Prediabetes   . Right ureteral stone   . Sleep apnea    C PAP  . Vertigo   . Vitamin D deficiency   . Wears glasses     Past Surgical History:  Procedure Laterality Date  . 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 ARTERY REPAIR Right 05/31/2014   Procedure: IRRIGATION, EXPLORATION, AND REPAIR OF RIGHT ARM WOUND;  Surgeon: Cherylynn Ridges, MD;  Location: [facility];  Service: General;  Laterality: Right;  . CYSTOSCOPY WITH RETROGRADE PYELOGRAM, URETEROSCOPY AND STENT PLACEMENT Right 07/11/2016   Procedure: CYSTOSCOPY WITH RETROGRADE PYELOGRAM, URETEROSCOPY, BASKET STONE EXTRACTION  AND STENT  PLACEMENT;  Surgeon: Sebastian Ache, MD;  Location: [facility];  Service: Urology;  Laterality: Right;  45 MINS  C-ARM DIGITAL URETEROSCOPE HOLMIUM LASER  . ESOPHAGOGASTRODUODENOSCOPY      There were no vitals filed for this visit.   Subjective Assessment - 06/27/20 0802    Subjective Patient reports new additions to HEP went well. Believes that manual to neck helped, but still report some headaches. Mild headache present this morning.    Pertinent History BPH, prediabetes, dizziness, hypertension, hyperlipidemia, Sleep Apnea, Depression, Obesity    Diagnostic tests results of MRI: cervical and lumbar spine unremarkable    Currently in Pain? Yes    Pain Score 2     Pain Location Head    Pain Descriptors / Indicators Headache    Pain Frequency Intermittent                             [facility] Adult PT Treatment/Exercise - 06/27/20 0001      Ambulation/Gait   Ambulation/Gait Yes    Ambulation/Gait Assistance 5: Supervision    Ambulation/Gait Assistance Details throughout therapy gym with activities    Assistive device None    Gait Pattern Step-through pattern;Wide base of support    Ambulation Surface Level;Indoor  Neuro Re-ed    Neuro Re-ed Details  Completed standing with 1# weighted ball completing circles with visual tracking. Patient completed x 10 reps of clockwise with minimal dizziness, followed by x 10 reps of counterclockwise circles. Overall patient demonstrate minimal increases in dizziness with completion. Completed diagonal "V" during gait with visual tracking, 25' x 4 laps.       Exercises   Exercises Other Exercises    Other Exercises  Completed cervical retraction x 5 reps with 3 second hold. Patient able to demonstrate appropriate completion. Completed scapular retraction x 10 reps with 3 seond hold to further promote improved posture. tactile/verbal cues for improved scapular retraction.       Manual Therapy   Manual Therapy  Soft tissue mobilization    Soft tissue mobilization Completed STM to suboccipitals and prolonged pressure for suboccipital release x 15 minutes. At start increased muscle tension noted in B suboccipitals, L>R. After manual therapy, decreased muscle tension noted. However with pressure to suboccipital, patient reports referring pain around the head and mild headache.                Balance Exercises - 06/27/20 0001      Balance Exercises: Standing   Standing Eyes Opened Narrow base of support (BOS);Head turns;Solid surface    Standing Eyes Opened Limitations completed 2 x 10 reps of horizontal/vertical head turns. Intermittent UE support required and CGA    Balance Beam Completed static standing on balance beam, eyes open, 1 min x 3 reps. Patient still demo increased retropulsion, and require intermittent UE support for steadying.     Other Standing Exercises Due to patient retropulsion, Patient completed romberg stance on downward incline to promote improved weigh shift anterior. Completed x 2 minutes. Pt still demo increased retropulsion and difficulty maintaining forward weight shift.                PT Short Term Goals - 06/15/20 1009      PT SHORT TERM GOAL #1   Title Patient will be independent with vestibular/balance HEP    Time 3    Period Weeks    Status New    Target Date 07/06/20      PT SHORT TERM GOAL #2   Title Pt will demonstrate ability to perform first 2 conditions of MCTSIB for 30 seconds, with minimal sway to indicate improved sensory integration and improved balance.    Time 3    Period Weeks    Status New    Target Date 07/06/20             PT Long Term Goals - 06/15/20 0849      PT LONG TERM GOAL #1   Title Patient will be independent with final vestibular/balance HEP    Baseline Patient reports independence with HEP, continue to progress.    Time 6    Period Weeks    Status Revised    Target Date 07/27/20      PT LONG TERM GOAL #2   Title  Patient will improve DHI by 10 points    Baseline 48/100    Time 6    Period Weeks    Status New    Target Date 07/27/20      PT LONG TERM GOAL #3   Title Patient will completed TUG in </= 10 secs to demonstrate reduced risk for falls    Baseline 11.91, 10.23    Time 6    Period Weeks  Status On-going    Target Date 07/27/20      PT LONG TERM GOAL #4   Title Pt will report at least 50% improvement in symptoms regarding dizziness    Baseline Pt reports minimal to no improvement in headaches, but does report some improvement in dizziness. Unable to determine % change.    Time 6    Period Weeks    Status On-going    Target Date 07/27/20      PT LONG TERM GOAL #5   Title Patient will improve FGA to >/= 23/30 to demonstrate improved balance and reduced risk for falls    Baseline 18/30 baseline, 21/30 on 6/18    Time 6    Period Weeks    Status Revised    Target Date 07/27/20      Additional Long Term Goals   Additional Long Term Goals Yes      PT LONG TERM GOAL #6   Title Pt will demonstrate ability to perform all 4 conditions of MCTSIB for 30 seconds, with minimal sway to indicate improved sensory integration and improved balance.    Time 6    Period Weeks    Status New    Target Date 07/27/20                 Plan - 06/27/20 0946    Clinical Impression Statement Continued STM to cervical region, with suboccipital release. Patient still reporting increased headache with palpation of B suboccipitals. Continued visual tracking and treatment focused on targeting vestibular system. Patient continues to demonstrate retropulsion with actvities and difficulty with forward weight shift. Pt will continue to benefit from skilled PT services to progress toward goals.    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 Actvities on Incline. Update HEP as needed. Continue habituation and gaze exercises.    Consulted and Agree with Plan of Care Patient           Patient will benefit from skilled therapeutic intervention in order to improve the following deficits and impairments:  Abnormal gait, Decreased balance, Decreased mobility, Difficulty walking, Impaired vision/preception, Dizziness, Decreased cognition, Decreased activity tolerance, Decreased strength, Pain  Visit Diagnosis: Unsteadiness on feet  Cervicalgia  Other abnormalities of gait and mobility  Muscle weakness (generalized)  Dizziness and giddiness     Problem List Patient Active Problem List   Diagnosis Date Noted  . Obstructive sleep apnea hypopnea, severe 03/16/2020  . 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  . Excessive daytime sleepiness 02/13/2020  . Chronic fatigue 02/13/2020  . BPH with obstruction/lower urinary tract symptoms   . Prediabetes 10/11/2018  . Former smoker 10/11/2018  . Family history of ischemic heart disease 10/11/2018  . FH: hypertension 03/23/2018  . Crushing injury of left elbow and forearm 07/13/2015  . 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 Medication management 02/08/2014  . Essential hypertension   . Hyperlipidemia, mixed   . OSA on CPAP   .  Abnormal glucose   . Morbid obesity (HCC)   . Vitamin D deficiency     Tempie Donning, PT, DPT 06/27/2020, 9:51 AM  [facility] [address] [place], [place], [address] Phone: [phone]   Fax:  [phone]  Name: ALFIE RIDEAUX MRN: [id number] Date of Birth: 04-29-1971

## 2020-06-29 ENCOUNTER — Ambulatory Visit: Payer: BC Managed Care – PPO | Attending: Diagnostic Neuroimaging

## 2020-06-29 ENCOUNTER — Other Ambulatory Visit: Payer: Self-pay

## 2020-06-29 DIAGNOSIS — R42 Dizziness and giddiness: Secondary | ICD-10-CM | POA: Insufficient documentation

## 2020-06-29 DIAGNOSIS — R2689 Other abnormalities of gait and mobility: Secondary | ICD-10-CM | POA: Diagnosis present

## 2020-06-29 DIAGNOSIS — R2681 Unsteadiness on feet: Secondary | ICD-10-CM | POA: Diagnosis not present

## 2020-06-29 DIAGNOSIS — M6281 Muscle weakness (generalized): Secondary | ICD-10-CM | POA: Diagnosis present

## 2020-06-29 DIAGNOSIS — M542 Cervicalgia: Secondary | ICD-10-CM | POA: Insufficient documentation

## 2020-06-29 NOTE — Therapy (Signed)
[facility] [address] [place], [place], [address] Phone: [phone]   Fax:  [phone]  Physical Therapy Treatment  Patient Details  Name: Shane Saunders MRN: [id number] Date of Birth: Aug 20, 1971 Referring Provider (PT): Joycelyn Schmid, MD   Encounter Date: 06/29/2020   PT End of Session - 06/29/20 0849    Visit Number 14    Number of Visits 23    Date for PT Re-Evaluation 50/27/74   new cert - POC 6 weeks, cert 90 days   Authorization Type BC[employer]    PT Start Time 0845    PT Stop Time 0929    PT Time Calculation (min) 44 min    Equipment Utilized During Treatment Gait belt    Activity Tolerance Patient tolerated treatment well    Behavior During Therapy [facility] for tasks assessed/performed           Past Medical History:  Diagnosis Date  . Abnormal glucose   . Borderline hypertension   . BPH (benign prostatic hypertrophy)   . Crushing injury of left elbow and forearm 07/13/2015  . 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 Dizziness   . Hyperlipidemia   . Hypertension   . Hypogonadism male   . 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 Morbid obesity (HCC)   . Obesity (BMI 30-39.9)   . OSA on CPAP   . Prediabetes   . Right ureteral stone   . Sleep apnea    C PAP  . Vertigo   . Vitamin D deficiency   . Wears glasses     Past Surgical History:  Procedure Laterality Date  . 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 ARTERY REPAIR Right 05/31/2014   Procedure: IRRIGATION, EXPLORATION, AND REPAIR OF RIGHT ARM WOUND;  Surgeon: Cherylynn Ridges, MD;  Location: [facility];  Service: General;  Laterality: Right;  . CYSTOSCOPY WITH RETROGRADE PYELOGRAM, URETEROSCOPY AND STENT PLACEMENT Right 07/11/2016   Procedure: CYSTOSCOPY WITH RETROGRADE PYELOGRAM, URETEROSCOPY, BASKET STONE EXTRACTION  AND STENT  PLACEMENT;  Surgeon: Sebastian Ache, MD;  Location: [facility];  Service: Urology;  Laterality: Right;  45 MINS  C-ARM DIGITAL URETEROSCOPE HOLMIUM LASER  . ESOPHAGOGASTRODUODENOSCOPY      There were no vitals filed for this visit.   Subjective Assessment - 06/29/20 0847    Subjective Patient reports worse headache yesterday. Reports some mild dizziness this morning.    Pertinent History BPH, prediabetes, dizziness, hypertension, hyperlipidemia, Sleep Apnea, Depression, Obesity    Diagnostic tests results of MRI: cervical and lumbar spine unremarkable    Currently in Pain? No/denies                             [facility] Adult PT Treatment/Exercise - 06/29/20 0001      Ambulation/Gait   Ambulation/Gait Yes    Ambulation/Gait Assistance 5: Supervision    Ambulation/Gait Assistance Details throughout therapy gym with activities    Assistive device None    Gait Pattern Step-through pattern;Wide base of support    Ambulation Surface Level;Indoor      Neuro Re-ed    Neuro Re-ed Details  Completed static standing with patient reaching forward to touch target 1 x 10 reps with focus on improved anterior weight shift and maintaining balance with completion.  Vestibular Treatment/Exercise - 06/29/20 0001      Vestibular Treatment/Exercise   Gaze Exercises X1 Viewing Horizontal;X1 Viewing Vertical      X1 Viewing Horizontal   Foot Position standing w/ feet apart    Reps 1    Comments completed 1 x 1 min, increased posterior sway      X1 Viewing Vertical   Foot Position standing w/ feet apart    Reps 1    Comments completed 1 x 1 min. demo increased difficulty with upward motion as lose balance posteriorly              Balance Exercises - 06/29/20 0001      Balance Exercises: Standing   Stepping Strategy Anterior;Posterior;Foam/compliant surface;Limitations    Stepping Strategy Limitations completed forward alternating stepping off  balance beam, 1 x 10 reps. Then completed posterior stepping off balance beam, 1 x 10 reps. Pt demo increased difficulty with forward stepping.     Rockerboard Anterior/posterior;Lateral;EO;Intermittent UE support;Limitations    Rockerboard Limitations completed x 2 reps with board postioned ant/post, x 2 reps with board positoned laterally. Focused on holding steady without UE support. However patient did require intermittent UE support for steadying. Pt demo increased difficulty with board positioned laterally, and experience increased use of hip strategy with completion.     Balance Beam Completed static standing on balance beam, 3 x 30 seconds with focus on holding steady. Continue to require intermittent UE support. Progressed to completing horizontal/vertical head turns 2 x 10 reps, pt demo difficulty maintaining balance with horizontal head turns.     Other Standing Exercises Standing on downward include to promote anterior weight shift, patient completing feet apart with eyes closed 3 x 15-20 secs. Then completed feet together wth eyes open 3 x 30 seconds. Increased difficulty with eyes closed noted. Also standing on level ground completed ant/post push and release test to determine stepping strategy. Patient demo appropriate forward stepping strategy, but demo difficulty with posterior require multiple steps and CGA from PT to avoid LOB.                PT Short Term Goals - 06/15/20 1009      PT SHORT TERM GOAL #1   Title Patient will be independent with vestibular/balance HEP    Time 3    Period Weeks    Status New    Target Date 07/06/20      PT SHORT TERM GOAL #2   Title Pt will demonstrate ability to perform first 2 conditions of MCTSIB for 30 seconds, with minimal sway to indicate improved sensory integration and improved balance.    Time 3    Period Weeks    Status New    Target Date 07/06/20             PT Long Term Goals - 06/15/20 0849      PT LONG TERM GOAL #1    Title Patient will be independent with final vestibular/balance HEP    Baseline Patient reports independence with HEP, continue to progress.    Time 6    Period Weeks    Status Revised    Target Date 07/27/20      PT LONG TERM GOAL #2   Title Patient will improve DHI by 10 points    Baseline 48/100    Time 6    Period Weeks    Status New    Target Date 07/27/20      PT LONG TERM GOAL #  3   Title Patient will completed TUG in </= 10 secs to demonstrate reduced risk for falls    Baseline 11.91, 10.23    Time 6    Period Weeks    Status On-going    Target Date 07/27/20      PT LONG TERM GOAL #4   Title Pt will report at least 50% improvement in symptoms regarding dizziness    Baseline Pt reports minimal to no improvement in headaches, but does report some improvement in dizziness. Unable to determine % change.    Time 6    Period Weeks    Status On-going    Target Date 07/27/20      PT LONG TERM GOAL #5   Title Patient will improve FGA to >/= 23/30 to demonstrate improved balance and reduced risk for falls    Baseline 18/30 baseline, 21/30 on 6/18    Time 6    Period Weeks    Status Revised    Target Date 07/27/20      Additional Long Term Goals   Additional Long Term Goals Yes      PT LONG TERM GOAL #6   Title Pt will demonstrate ability to perform all 4 conditions of MCTSIB for 30 seconds, with minimal sway to indicate improved sensory integration and improved balance.    Time 6    Period Weeks    Status New    Target Date 07/27/20                 Plan - 06/29/20 1503    Clinical Impression Statement Continued balance actvities today and progressed as tolerated by patient. Patient still continue to demonstrate posterior weight shift onto heels, and freq posterior sway with balance activities on complaint surfaces and with eyes closed. PT incorporating activites to promote improved forward weight shift. Pt will continue to benefit from skilled PT services to  progress toward all goals and address deficits.    Personal Factors and Comorbidities Comorbidity 3+;Time since onset of injury/illness/exacerbation    Comorbidities BPH, prediabetes, dizziness, hypertension, hyperlipidemia, Sleep Apnea, Depression, Obesity    Examination-Activity Limitations Bend;Lift    Examination-Participation Restrictions Driving;Other   Work   Conservation officer, historic buildings Evolving/Moderate complexity    Rehab Potential Good    PT Frequency 2x / week    PT Duration 6 weeks    PT Treatment/Interventions ADLs/Self Care Home Management;Canalith Repostioning;Cryotherapy;Moist Heat;Gait training;Stair training;Functional mobility training;Therapeutic activities;Therapeutic exercise;Balance training;Neuromuscular re-education;Patient/family education;Vestibular;Manual techniques;Dry needling;Passive range of motion    PT Next Visit Plan Continue STM/Suboccipital Release. Balance actvities on Incline. Update HEP as needed. Continue habituation and gaze exercises.    Consulted and Agree with Plan of Care Patient           Patient will benefit from skilled therapeutic intervention in order to improve the following deficits and impairments:  Abnormal gait, Decreased balance, Decreased mobility, Difficulty walking, Impaired vision/preception, Dizziness, Decreased cognition, Decreased activity tolerance, Decreased strength, Pain  Visit Diagnosis: Unsteadiness on feet  Other abnormalities of gait and mobility  Muscle weakness (generalized)  Dizziness and giddiness  Cervicalgia     Problem List Patient Active Problem List   Diagnosis Date Noted  . Obstructive sleep apnea hypopnea, severe 03/16/2020  . 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  . Excessive daytime sleepiness 02/13/2020  . Chronic fatigue 02/13/2020  . BPH with obstruction/lower urinary tract symptoms   . Prediabetes 10/11/2018  . Former smoker 10/11/2018  . Family history of ischemic heart disease  10/11/2018  .  FH: hypertension 03/23/2018  . Crushing injury of left elbow and forearm 07/13/2015  . 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 Medication management 02/08/2014  . Essential hypertension   . Hyperlipidemia, mixed   . OSA on CPAP   . Abnormal glucose   . Morbid obesity (HCC)   . Vitamin D deficiency     Tempie Donning, PT, DPT 06/29/2020, 3:07 PM  [facility] [address] [place], [place], [address] Phone: [phone]   Fax:  [phone]  Name: Shane Saunders MRN: [id number] Date of Birth: [birth year]

## 2020-07-01 ENCOUNTER — Other Ambulatory Visit: Payer: Self-pay | Admitting: Internal Medicine

## 2020-07-01 DIAGNOSIS — F5101 Primary insomnia: Secondary | ICD-10-CM

## 2020-07-04 ENCOUNTER — Other Ambulatory Visit: Payer: Self-pay | Admitting: Physician Assistant

## 2020-07-04 ENCOUNTER — Ambulatory Visit: Payer: BC Managed Care – PPO

## 2020-07-04 ENCOUNTER — Other Ambulatory Visit: Payer: Self-pay

## 2020-07-04 DIAGNOSIS — R2689 Other abnormalities of gait and mobility: Secondary | ICD-10-CM

## 2020-07-04 DIAGNOSIS — F419 Anxiety disorder, unspecified: Secondary | ICD-10-CM

## 2020-07-04 DIAGNOSIS — M542 Cervicalgia: Secondary | ICD-10-CM

## 2020-07-04 DIAGNOSIS — R2681 Unsteadiness on feet: Secondary | ICD-10-CM

## 2020-07-04 DIAGNOSIS — M6281 Muscle weakness (generalized): Secondary | ICD-10-CM

## 2020-07-04 DIAGNOSIS — R42 Dizziness and giddiness: Secondary | ICD-10-CM

## 2020-07-04 NOTE — Therapy (Signed)
[facility] [address] [place], [place], [address] Phone: [phone]   Fax:  [phone]  Physical Therapy Treatment  Patient Details  Name: Shane Saunders MRN: [id number] Date of Birth: November 21, 1971 Referring Provider (PT): Joycelyn Schmid, MD   Encounter Date: 07/04/2020   PT End of Session - 07/04/20 0803    Visit Number 15    Number of Visits 23    Date for PT Re-Evaluation 30/16/01   new cert - POC 6 weeks, cert 90 days   Authorization Type [employer]    PT Start Time 0800    PT Stop Time 0845    PT Time Calculation (min) 45 min    Equipment Utilized During Treatment Gait belt    Activity Tolerance Patient tolerated treatment well    Behavior During Therapy WFL for tasks assessed/performed           Past Medical History:  Diagnosis Date  . Abnormal glucose   . Borderline hypertension   . BPH (benign prostatic hypertrophy)   . Crushing injury of left elbow and forearm 07/13/2015  . 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 Dizziness   . Hyperlipidemia   . Hypertension   . Hypogonadism male   . 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 Morbid obesity (HCC)   . Obesity (BMI 30-39.9)   . OSA on CPAP   . Prediabetes   . Right ureteral stone   . Sleep apnea    C PAP  . Vertigo   . Vitamin D deficiency   . Wears glasses     Past Surgical History:  Procedure Laterality Date  . 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 ARTERY REPAIR Right 05/31/2014   Procedure: IRRIGATION, EXPLORATION, AND REPAIR OF RIGHT ARM WOUND;  Surgeon: Cherylynn Ridges, MD;  Location: [facility] OR;  Service: General;  Laterality: Right;  . CYSTOSCOPY WITH RETROGRADE PYELOGRAM, URETEROSCOPY AND STENT PLACEMENT Right 07/11/2016   Procedure: CYSTOSCOPY WITH RETROGRADE PYELOGRAM, URETEROSCOPY, BASKET STONE EXTRACTION  AND STENT  PLACEMENT;  Surgeon: Sebastian Ache, MD;  Location: [facility];  Service: Urology;  Laterality: Right;  45 MINS  C-ARM DIGITAL URETEROSCOPE HOLMIUM LASER  . ESOPHAGOGASTRODUODENOSCOPY      There were no vitals filed for this visit.   Subjective Assessment - 07/04/20 0803    Subjective Patient reports feeling about the same since last visit. No changes. Patient reports got dizzy and nausea riding in the car. No dizziness/headache present this morning.    Pertinent History BPH, prediabetes, dizziness, hypertension, hyperlipidemia, Sleep Apnea, Depression, Obesity    Diagnostic tests results of MRI: cervical and lumbar spine unremarkable    Currently in Pain? No/denies                             [facility] Adult PT Treatment/Exercise - 07/04/20 0001      Ambulation/Gait   Ambulation/Gait Yes    Ambulation/Gait Assistance 5: Supervision    Ambulation/Gait Assistance Details througout therapy gym with actvities    Assistive device None    Gait Pattern Step-through pattern;Wide base of support    Ambulation Surface Level;Indoor      Exercises   Exercises Other Exercises    Other Exercises  Completed and educated on completion of standing gastroc stretch at countertop,  completed on BLE 2  x 30 seconds. verbal cues for technique.            Vestibular Treatment/Exercise - 07/04/20 0001      Vestibular Treatment/Exercise   Habituation Exercises Gari Crown Daroff   Number of Reps  4    Symptom Description  completed to R and L due to patient reports of dizziness coming up from rising, patient reports mild dizziness coming up in bilateral directions. Patient reports that dizziness feels like he is "disoriented"               Balance Exercises - 07/04/20 0001      Balance Exercises: Standing   Rockerboard Anterior/posterior;Lateral;EO    Rockerboard Limitations completed 3 x 1 min with EO positioned ant/post, and 3 x 20-40 secs  with board positioned laterally. Patient continue to demo increased difficulty with board positioned laterally. On rockerboard patient demo increased use of hip strategy to regain balance.    Other Standing Exercises Continued to stand on downward slope to promote anterior weight shift, patient completing feet apart with eyes open, 3 x 30- 45 secs. Also completed standing on upward slope, with focus on anterior weight shift. Patient demo increased difficulty with maintaing balance with this position. When patient begins to lose balance, patient demonstrates hip strategy and often overcorrects at times.                PT Short Term Goals - 06/15/20 1009      PT SHORT TERM GOAL #1   Title Patient will be independent with vestibular/balance HEP    Time 3    Period Weeks    Status New    Target Date 07/06/20      PT SHORT TERM GOAL #2   Title Pt will demonstrate ability to perform first 2 conditions of MCTSIB for 30 seconds, with minimal sway to indicate improved sensory integration and improved balance.    Time 3    Period Weeks    Status New    Target Date 07/06/20             PT Long Term Goals - 06/15/20 0849      PT LONG TERM GOAL #1   Title Patient will be independent with final vestibular/balance HEP    Baseline Patient reports independence with HEP, continue to progress.    Time 6    Period Weeks    Status Revised    Target Date 07/27/20      PT LONG TERM GOAL #2   Title Patient will improve DHI by 10 points    Baseline 48/100    Time 6    Period Weeks    Status New    Target Date 07/27/20      PT LONG TERM GOAL #3   Title Patient will completed TUG in </= 10 secs to demonstrate reduced risk for falls    Baseline 11.91, 10.23    Time 6    Period Weeks    Status On-going    Target Date 07/27/20      PT LONG TERM GOAL #4   Title Pt will report at least 50% improvement in symptoms regarding dizziness    Baseline Pt reports minimal to no improvement in  headaches, but does report some improvement in dizziness. Unable to determine % change.    Time 6    Period Weeks    Status On-going    Target  Date 07/27/20      PT LONG TERM GOAL #5   Title Patient will improve FGA to >/= 23/30 to demonstrate improved balance and reduced risk for falls    Baseline 18/30 baseline, 21/30 on 6/18    Time 6    Period Weeks    Status Revised    Target Date 07/27/20      Additional Long Term Goals   Additional Long Term Goals Yes      PT LONG TERM GOAL #6   Title Pt will demonstrate ability to perform all 4 conditions of MCTSIB for 30 seconds, with minimal sway to indicate improved sensory integration and improved balance.    Time 6    Period Weeks    Status New    Target Date 07/27/20                 Plan - 07/04/20 0857    Clinical Impression Statement Patient continues to demonstrate difficulty with balance activities that include anterior weight shift, due to preference for posterior weight shift. PT continue to challenge balance and progress as tolerated by patient. With [employer] exercises, patient only demonstrate mild dizziness with coming up from postion. Patient will continue to benefit from skilled PT services to progress toward goals.    Personal Factors and Comorbidities Comorbidity 3+;Time since onset of injury/illness/exacerbation    Comorbidities BPH, prediabetes, dizziness, hypertension, hyperlipidemia, Sleep Apnea, Depression, Obesity    Examination-Activity Limitations Bend;Lift    Examination-Participation Restrictions Driving;Other   Work   Conservation officer, historic buildings Evolving/Moderate complexity    Rehab Potential Good    PT Frequency 2x / week    PT Duration 6 weeks    PT Treatment/Interventions ADLs/Self Care Home Management;Canalith Repostioning;Cryotherapy;Moist Heat;Gait training;Stair training;Functional mobility training;Therapeutic activities;Therapeutic exercise;Balance training;Neuromuscular  re-education;Patient/family education;Vestibular;Manual techniques;Dry needling;Passive range of motion    PT Next Visit Plan Continue STM/Suboccipital Release. Balance actvities on Incline. Update HEP as needed. Continue habituation and gaze exercises.    Consulted and Agree with Plan of Care Patient           Patient will benefit from skilled therapeutic intervention in order to improve the following deficits and impairments:  Abnormal gait, Decreased balance, Decreased mobility, Difficulty walking, Impaired vision/preception, Dizziness, Decreased cognition, Decreased activity tolerance, Decreased strength, Pain  Visit Diagnosis: Unsteadiness on feet  Other abnormalities of gait and mobility  Muscle weakness (generalized)  Dizziness and giddiness  Cervicalgia     Problem List Patient Active Problem List   Diagnosis Date Noted  . Obstructive sleep apnea hypopnea, severe 03/16/2020  . 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  . Excessive daytime sleepiness 02/13/2020  . Chronic fatigue 02/13/2020  . BPH with obstruction/lower urinary tract symptoms   . Prediabetes 10/11/2018  . Former smoker 10/11/2018  . Family history of ischemic heart disease 10/11/2018  . FH: hypertension 03/23/2018  . Crushing injury of left elbow and forearm 07/13/2015  . 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 Medication management 02/08/2014  . Essential hypertension   . Hyperlipidemia, mixed   . OSA on CPAP   . Abnormal glucose   . Morbid obesity (HCC)   . Vitamin D deficiency     Tempie Donning, PT, DPT 07/04/2020, 9:00 AM  [facility] [address] [place], [place], [address] Phone: [phone]   Fax:  [phone]  Name: Shane Saunders MRN: [id number] Date of Birth: 02-16-1971

## 2020-07-06 ENCOUNTER — Ambulatory Visit: Payer: BC Managed Care – PPO

## 2020-07-06 ENCOUNTER — Other Ambulatory Visit: Payer: Self-pay

## 2020-07-06 DIAGNOSIS — R2681 Unsteadiness on feet: Secondary | ICD-10-CM | POA: Diagnosis not present

## 2020-07-06 DIAGNOSIS — M6281 Muscle weakness (generalized): Secondary | ICD-10-CM

## 2020-07-06 DIAGNOSIS — R2689 Other abnormalities of gait and mobility: Secondary | ICD-10-CM

## 2020-07-06 NOTE — Therapy (Signed)
[facility] [address] [place], [place], [address] Phone: [phone]   Fax:  [phone]  Physical Therapy Treatment  Patient Details  Name: Shane Saunders MRN: [id number] Date of Birth: May 26, 1971 Referring Provider (PT): Andrey Spearman, MD   Encounter Date: 07/06/2020   PT End of Session - 07/06/20 0852    Visit Number 16    Number of Visits 23    Date for PT Re-Evaluation 09/32/35   new cert - POC 6 weeks, cert 90 days   Authorization Type [employer]    PT Start Time [phone]    PT Stop Time 0930    PT Time Calculation (min) 41 min    Equipment Utilized During Treatment Gait belt    Activity Tolerance Patient tolerated treatment well    Behavior During Therapy [facility] for tasks assessed/performed           Past Medical History:  Diagnosis Date  . Abnormal glucose   . Borderline hypertension   . BPH (benign prostatic hypertrophy)   . Crushing injury of left elbow and forearm 07/13/2015  . Depression, major, in partial remission ([place]) 07/12/2015  . Dizziness   . Hyperlipidemia   . Hypertension   . Hypogonadism male   . 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 Morbid obesity ([place])   . Obesity (BMI 30-39.9)   . OSA on CPAP   . Prediabetes   . Right ureteral stone   . Sleep apnea    C PAP  . Vertigo   . Vitamin D deficiency   . Wears glasses     Past Surgical History:  Procedure Laterality Date  . 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 ARTERY REPAIR Right 05/31/2014   Procedure: IRRIGATION, EXPLORATION, AND REPAIR OF RIGHT ARM WOUND;  Surgeon: Gwenyth Ober, MD;  Location: [facility];  Service: General;  Laterality: Right;  . CYSTOSCOPY WITH RETROGRADE PYELOGRAM, URETEROSCOPY AND STENT PLACEMENT Right 07/11/2016   Procedure: CYSTOSCOPY WITH RETROGRADE PYELOGRAM, URETEROSCOPY, BASKET STONE EXTRACTION  AND STENT  PLACEMENT;  Surgeon: Alexis Frock, MD;  Location: [facility];  Service: Urology;  Laterality: Right;  45 MINS  C-ARM DIGITAL URETEROSCOPE HOLMIUM LASER  . ESOPHAGOGASTRODUODENOSCOPY      There were no vitals filed for this visit.   Subjective Assessment - 07/06/20 0851    Subjective Patient reports feeling "pretty good" this morning. Patient reports still minor headache this morning.    Pertinent History BPH, prediabetes, dizziness, hypertension, hyperlipidemia, Sleep Apnea, Depression, Obesity    Diagnostic tests results of MRI: cervical and lumbar spine unremarkable    Currently in Pain? No/denies                             [facility] Adult PT Treatment/Exercise - 07/06/20 0001      Ambulation/Gait   Ambulation/Gait Yes    Ambulation/Gait Assistance 5: Supervision    Ambulation/Gait Assistance Details throughout therapy gym with activities     Assistive device None    Gait Pattern Step-through pattern;Wide base of support    Ambulation Surface Level;Indoor      High Level Balance   High Level Balance Activities Tandem walking    High Level Balance Comments Completed tandem walking on firm surface with no UE support along counter x 3 laps down  and back. Progressed to completing on blue balance beam with 1 light UE support.       Neuro Re-ed    Neuro Re-ed Details  At the countertop: completed ant/post weight shifts reaching to target with BUE x 15 reps. Completed lateral weight shifts x 150 reps to R/L reaching for targets. Patient demonstrate increased difficulty with post weight shift and maintaining balance. Standing on balance beam completed forward/posterior weight shift with toe tap x 15 reps without UE support.            Vestibular Treatment/Exercise - 07/06/20 0001      360 degree Turns   Number of Reps  8   5   Symptom Description  completed with 3-4 steps between 360 deg turns, completed x 8 reps prior to needing rest break.     COMMENT patient able to complete increased number of turns before increase in symptoms today, but still reports moderate dizziness (5/10) after completion.                    PT Short Term Goals - 07/06/20 0952      PT SHORT TERM GOAL #1   Title Patient will be independent with vestibular/balance HEP    Baseline Pt verbalizes independence    Time 3    Period Weeks    Status Achieved    Target Date 07/06/20      PT SHORT TERM GOAL #2   Title Pt will demonstrate ability to perform first 2 conditions of MCTSIB for 30 seconds, with minimal sway to indicate improved sensory integration and improved balance.    Baseline Pt complete situation 1 for 30 seconds, sitation 2 for 20-25 seconds at this time.    Time 3    Period Weeks    Status Partially Met    Target Date 07/06/20             PT Long Term Goals - 06/15/20 0849      PT LONG TERM GOAL #1   Title Patient will be independent with final vestibular/balance HEP    Baseline Patient reports independence with HEP, continue to progress.    Time 6    Period Weeks    Status Revised    Target Date 07/27/20      PT LONG TERM GOAL #2   Title Patient will improve DHI by 10 points    Baseline 48/100    Time 6    Period Weeks    Status New    Target Date 07/27/20      PT LONG TERM GOAL #3   Title Patient will completed TUG in </= 10 secs to demonstrate reduced risk for falls    Baseline 11.91, 10.23    Time 6    Period Weeks    Status On-going    Target Date 07/27/20      PT LONG TERM GOAL #4   Title Pt will report at least 50% improvement in symptoms regarding dizziness    Baseline Pt reports minimal to no improvement in headaches, but does report some improvement in dizziness. Unable to determine % change.    Time 6    Period Weeks    Status On-going    Target Date 07/27/20      PT LONG TERM GOAL #5   Title Patient will improve FGA to >/= 23/30 to demonstrate improved balance and reduced risk for falls     Baseline 18/30 baseline, 21/30 on 6/18  Time 6    Period Weeks    Status Revised    Target Date 07/27/20      Additional Long Term Goals   Additional Long Term Goals Yes      PT LONG TERM GOAL #6   Title Pt will demonstrate ability to perform all 4 conditions of MCTSIB for 30 seconds, with minimal sway to indicate improved sensory integration and improved balance.    Time 6    Period Weeks    Status New    Target Date 07/27/20                 Plan - 07/06/20 0948    Clinical Impression Statement Continued balance actvities working on improved ant/post weight shift and targeting improvements in limits of stability. Also completed 180/360 deg turns, patient able to complete increased repetition of 360 deg turn prior to dizziness onset. Pt will continue to benefit from skilled PT services to progress toward goals.    Personal Factors and Comorbidities Comorbidity 3+;Time since onset of injury/illness/exacerbation    Comorbidities BPH, prediabetes, dizziness, hypertension, hyperlipidemia, Sleep Apnea, Depression, Obesity    Examination-Activity Limitations Bend;Lift    Examination-Participation Restrictions Driving;Other   Work   [employer] Evolving/Moderate complexity    Rehab Potential Good    PT Frequency 2x / week    PT Duration 6 weeks    PT Treatment/Interventions ADLs/Self Care Home Management;Canalith Repostioning;Cryotherapy;Moist Heat;Gait training;Stair training;Functional mobility training;Therapeutic activities;Therapeutic exercise;Balance training;Neuromuscular re-education;Patient/family education;Vestibular;Manual techniques;Dry needling;Passive range of motion    PT Next Visit Plan Continue STM/Suboccipital Release. Balance actvities on Incline. Update HEP as needed. Continue habituation and gaze exercises.    Consulted and Agree with Plan of Care Patient           Patient will benefit from skilled therapeutic intervention in order to  improve the following deficits and impairments:  Abnormal gait, Decreased balance, Decreased mobility, Difficulty walking, Impaired vision/preception, Dizziness, Decreased cognition, Decreased activity tolerance, Decreased strength, Pain  Visit Diagnosis: Unsteadiness on feet  Other abnormalities of gait and mobility  Muscle weakness (generalized)     Problem List Patient Active Problem List   Diagnosis Date Noted  . Obstructive sleep apnea hypopnea, severe 03/16/2020  . 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  . Excessive daytime sleepiness 02/13/2020  . Chronic fatigue 02/13/2020  . BPH with obstruction/lower urinary tract symptoms   . Prediabetes 10/11/2018  . Former smoker 10/11/2018  . Family history of ischemic heart disease 10/11/2018  . FH: hypertension 03/23/2018  . Crushing injury of left elbow and forearm 07/13/2015  . Depression, major, in partial remission ([place]) 07/12/2015  . Medication management 02/08/2014  . Essential hypertension   . Hyperlipidemia, mixed   . OSA on CPAP   . Abnormal glucose   . Morbid obesity ([place])   . Vitamin D deficiency     Jones Bales, PT, DPT 07/06/2020, 9:53 AM  [facility] [address] [place], [place], [address] Phone: [phone]   Fax:  [phone]  Name: Shane Saunders MRN: [id number] Date of Birth: July 03, 1971

## 2020-07-11 ENCOUNTER — Other Ambulatory Visit: Payer: Self-pay | Admitting: Internal Medicine

## 2020-07-11 ENCOUNTER — Other Ambulatory Visit: Payer: Self-pay

## 2020-07-11 ENCOUNTER — Telehealth: Payer: Self-pay | Admitting: Physical Therapy

## 2020-07-11 ENCOUNTER — Ambulatory Visit: Payer: BC Managed Care – PPO | Admitting: Physical Therapy

## 2020-07-11 ENCOUNTER — Encounter: Payer: Self-pay | Admitting: Physical Therapy

## 2020-07-11 DIAGNOSIS — R2681 Unsteadiness on feet: Secondary | ICD-10-CM | POA: Diagnosis not present

## 2020-07-11 DIAGNOSIS — M542 Cervicalgia: Secondary | ICD-10-CM

## 2020-07-11 DIAGNOSIS — R42 Dizziness and giddiness: Secondary | ICD-10-CM

## 2020-07-11 NOTE — Patient Instructions (Signed)
Occipital Neuralgia  Occipital neuralgia is a type of headache that causes brief episodes of very bad pain in the back of your head. Pain from occipital neuralgia may spread (radiate) to other parts of your head. These headaches may be caused by irritation of the nerves that leave your spinal cord high up in your neck, just below the base of your skull (occipital nerves). Your occipital nerves transmit sensations from the back of your head, the top of your head, and the areas behind your ears. What are the causes? This condition can occur without any known cause (primary headache syndrome). In other cases, this condition is caused by pressure on or irritation of one of the two occipital nerves. Pressure and irritation may be due to:  Muscle spasm in the neck.  Neck injury.  Wear and tear of the vertebrae in the neck (osteoarthritis).  Disease of the disks that separate the vertebrae.  Swollen blood vessels that put pressure on the occipital nerves.  Infections.  Tumors.  Diabetes. What are the signs or symptoms? This condition causes brief burning, stabbing, electric, shocking, or shooting pain which can radiate to the top of the head. It can happen on one side or both sides of the head. It can also cause:  Pain behind the eye.  Pain triggered by neck movement or hair brushing.  Scalp tenderness.  Aching in the back of the head between episodes of very bad pain.  Pain gets worse with exposure to bright lights. How is this diagnosed? There is no test that diagnoses this condition. Your health care provider may diagnose this condition based on a physical exam and your symptoms. Other tests may be done, such as:  Imaging studies of the brain and neck (cervical spine), such as an MRI or CT scan. These look for causes of pinched nerves.  Applying pressure to the nerves in the neck to try to re-create the pain.  Injection of numbing medicine into the occipital nerve areas to see if  pain goes away (diagnostic nerve block). How is this treated? Treatment for this condition may begin with simple measures, such as:  Rest.  Massage.  Applying heat or cold on the area.  Over-the-counter pain relievers. If these measures do not work, you may need other treatments, including:  Medicines, such as: ? Prescription-strength anti-inflammatory medicines. ? Muscle relaxants. ? Anti-seizure medicines, which can relieve pain. ? Antidepressants, which can relieve pain. ? Injected medicines, such as medicines that numb the area (local anesthetic) and steroids.  Pulsed radiofrequency ablation. This is when wires are implanted to deliver electrical impulses that block pain signals from the occipital nerve.  Surgery to relieve nerve pressure.  Physical therapy. Follow these instructions at home: Pain management      Avoid any activities that cause pain.  Rest when you have an attack of pain.  Try gentle massage to relieve pain.  Try a different pillow or sleeping position.  If directed, apply heat to the affected area as told by your health care provider. Use the heat source that your health care provider recommends, such as a moist heat pack or a heating pad. ? Place a towel between your skin and the heat source. ? Leave the heat on for 20-30 minutes. ? Remove the heat if your skin turns bright red. This is especially important if you are unable to feel pain, heat, or cold. You may have a greater risk of getting burned.  If directed, apply ice to the   back of the head and neck area as told by your health care provider. ? Put ice in a plastic bag. ? Place a towel between your skin and the bag. ? Leave the ice on for 20 minutes, 2-3 times per day. General instructions  Take over-the-counter and prescription medicines only as told by your health care provider.  Avoid things that make your symptoms worse, such as bright lights.  Try to stay active. Get regular  exercise that does not cause pain. Ask your health care provider to suggest safe exercises for you.  Work with a physical therapist to learn stretching exercises you can do at home.  Practice good posture.  Keep all follow-up visits as told by your health care provider. This is important. Contact a health care provider if:  Your medicine is not working.  You have new or worsening symptoms. Get help right away if:  You have very bad head pain that does not go away.  You have a sudden change in vision, balance, or speech. Summary  Occipital neuralgia is a type of headache that causes brief episodes of very bad pain in the back of your head.  Pain from occipital neuralgia may spread (radiate) to other parts of your head.  Treatment for this condition includes rest, massage, and medicines. This information is not intended to replace advice given to you by your health care provider. Make sure you discuss any questions you have with your health care provider. Document Revised: 12/01/2017 Document Reviewed: 02/19/2017 Elsevier Patient Education  2020 [employer].   Trigger Point Dry Needling  . What is Trigger Point Dry Needling (DN)? o DN is a physical therapy technique used to treat muscle pain and dysfunction. Specifically, DN helps deactivate muscle trigger points (muscle knots).  o A thin filiform needle is used to penetrate the skin and stimulate the underlying trigger point. The goal is for a local twitch response (LTR) to occur and for the trigger point to relax. No medication of any kind is injected during the procedure.   . What Does Trigger Point Dry Needling Feel Like?  o The procedure feels different for each individual patient. Some patients report that they do not actually feel the needle enter the skin and overall the process is not painful. Very mild bleeding may occur. However, many patients feel a deep cramping in the muscle in which the needle was inserted. This is  the local twitch response.   Marland Kitchen How Will I feel after the treatment? o Soreness is normal, and the onset of soreness may not occur for a few hours. Typically this soreness does not last longer than two days.  o Bruising is uncommon, however; ice can be used to decrease any possible bruising.  o In rare cases feeling tired or nauseous after the treatment is normal. In addition, your symptoms may get worse before they get better, this period will typically not last longer than 24 hours.   . What Can I do After My Treatment? o Increase your hydration by drinking more water for the next 24 hours. o You may place ice or heat on the areas treated that have become sore, however, do not use heat on inflamed or bruised areas. Heat often brings more relief post needling. o You can continue your regular activities, but vigorous activity is not recommended initially after the treatment for 24 hours. o DN is best combined with other physical therapy such as strengthening, stretching, and other therapies

## 2020-07-11 NOTE — Telephone Encounter (Signed)
   Yes, I think it is fine to try cervical dry needling  - bill Annalysse Shoemaker

## 2020-07-11 NOTE — Telephone Encounter (Signed)
Hello Dr. Oneta Rack,  We have been working with Marchia Bond for headaches and dizziness. We feel he would benefit from dry needling of his suboccipital muscles but he has the skin infection right where we would be inserting needles. Do you feel it would be contraindicated to perform suboccipital dry needling or do you feel it would be safe since he is on antibiotics and we would thoroughly clean his skin?   Thank you, Bufford Lope, PT

## 2020-07-11 NOTE — Therapy (Signed)
[facility] [address] [place], [place], [address] Phone: [phone]   Fax:  [phone]  Physical Therapy Treatment  Patient Details  Name: Shane Saunders MRN: [id number] Date of Birth: 16-Feb-1971 Referring Provider (PT): Joycelyn Schmid, MD   Encounter Date: 07/11/2020   PT End of Session - 07/11/20 1008    Visit Number 17    Number of Visits 23    Date for PT Re-Evaluation 89/96/49   new cert - POC 6 weeks, cert 90 days   Authorization Type BCBS    PT Start Time 0805    PT Stop Time 0849    PT Time Calculation (min) 44 min    Equipment Utilized During Treatment Other (comment)   dry needling   Activity Tolerance Patient tolerated treatment well    Behavior During Therapy [facility] for tasks assessed/performed           Past Medical History:  Diagnosis Date  . Abnormal glucose   . Borderline hypertension   . BPH (benign prostatic hypertrophy)   . Crushing injury of left elbow and forearm 07/13/2015  . 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 Dizziness   . Hyperlipidemia   . Hypertension   . Hypogonadism male   . 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 Morbid obesity (HCC)   . Obesity (BMI 30-39.9)   . OSA on CPAP   . Prediabetes   . Right ureteral stone   . Sleep apnea    C PAP  . Vertigo   . Vitamin D deficiency   . Wears glasses     Past Surgical History:  Procedure Laterality Date  . 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 ARTERY REPAIR Right 05/31/2014   Procedure: IRRIGATION, EXPLORATION, AND REPAIR OF RIGHT ARM WOUND;  Surgeon: Cherylynn Ridges, MD;  Location: [facility];  Service: General;  Laterality: Right;  . CYSTOSCOPY WITH RETROGRADE PYELOGRAM, URETEROSCOPY AND STENT PLACEMENT Right 07/11/2016   Procedure: CYSTOSCOPY WITH RETROGRADE PYELOGRAM, URETEROSCOPY, BASKET STONE  EXTRACTION  AND STENT PLACEMENT;  Surgeon: Sebastian Ache, MD;  Location: [facility];  Service: Urology;  Laterality: Right;  45 MINS  C-ARM DIGITAL URETEROSCOPE HOLMIUM LASER  . ESOPHAGOGASTRODUODENOSCOPY      There were no vitals filed for this visit.   Subjective Assessment - 07/11/20 0806    Subjective Pt reports no change overall; still having headache every day.  Some days worse than others.  Takes medication to help relieve severe HA.  Takes injections for migraines but doesn't feel like they help these constant HA.  Lying down or taking his hat off helps eases the headache.  Symptoms start in the back and radiate forwards, bilaterally.  No sensitivity to light or sounds.  Scalp becomes very tender and hypersensitive.    Pertinent History BPH, prediabetes, dizziness, hypertension, hyperlipidemia, Sleep Apnea, Depression, Obesity    Diagnostic tests results of MRI: cervical and lumbar spine unremarkable    Currently in Pain? Yes    Pain Score 2     Pain Location Head    Pain Orientation Posterior    Pain Descriptors / Indicators Headache;Nagging                             [facility] Adult PT Treatment/Exercise - 07/11/20 1002  Therapeutic Activites    Therapeutic Activities Other Therapeutic Activities    Other Therapeutic Activities Provided pt with handout and education on occipital neuralgia as many of patient's headache symptoms could be more consistent with neuralgia diagnosis vs. migraine.  Also educated on trigger point dry needling and contraindications/precautions reviewed.  Will discuss with PCP about proceeding with suboccipital TDN in the presence of skin infection.      Manual Therapy   Manual Therapy Joint mobilization;Myofascial release;Manual Traction    Manual therapy comments in Supine after TDN    Joint Mobilization 1st rib depression and mobilization bilaterally x 60 seconds    Myofascial Release Suboccipital release in  supine in combination with manual traction to address increased tension on suboccipital nerves with pt reporting improvement in HA    Manual Traction In combination with suboccipital release x 3-4 minutes after TDN to address tension in upper cervical spine and decrease pressure on subocciptial nerves            Trigger Point Dry Needling - 07/11/20 1000    Consent Given? Yes    Education Handout Provided Yes    Muscles Treated Head and Neck Upper trapezius    Dry Needling Comments Performed in supine bilaterally.  Unable to perform TDN to suboccipital space due to skin infection being treated with antibiotics.  Will seek approval from PCP for dry needling of suboccipital muscles since pt is on antibiotics and therapist would clean skin before performing needling.    Upper Trapezius Response Twitch reponse elicited;Palpable increased muscle length                PT Education - 07/11/20 1008    Education Details See TA    Person(s) Educated Patient    Methods Explanation    Comprehension Verbalized understanding            PT Short Term Goals - 07/06/20 0952      PT SHORT TERM GOAL #1   Title Patient will be independent with vestibular/balance HEP    Baseline Pt verbalizes independence    Time 3    Period Weeks    Status Achieved    Target Date 07/06/20      PT SHORT TERM GOAL #2   Title Pt will demonstrate ability to perform first 2 conditions of MCTSIB for 30 seconds, with minimal sway to indicate improved sensory integration and improved balance.    Baseline Pt complete situation 1 for 30 seconds, sitation 2 for 20-25 seconds at this time.    Time 3    Period Weeks    Status Partially Met    Target Date 07/06/20             PT Long Term Goals - 06/15/20 0849      PT LONG TERM GOAL #1   Title Patient will be independent with final vestibular/balance HEP    Baseline Patient reports independence with HEP, continue to progress.    Time 6    Period Weeks     Status Revised    Target Date 07/27/20      PT LONG TERM GOAL #2   Title Patient will improve DHI by 10 points    Baseline 48/100    Time 6    Period Weeks    Status New    Target Date 07/27/20      PT LONG TERM GOAL #3   Title Patient will completed TUG in </= 10 secs to demonstrate reduced risk  for falls    Baseline 11.91, 10.23    Time 6    Period Weeks    Status On-going    Target Date 07/27/20      PT LONG TERM GOAL #4   Title Pt will report at least 50% improvement in symptoms regarding dizziness    Baseline Pt reports minimal to no improvement in headaches, but does report some improvement in dizziness. Unable to determine % change.    Time 6    Period Weeks    Status On-going    Target Date 07/27/20      PT LONG TERM GOAL #5   Title Patient will improve FGA to >/= 23/30 to demonstrate improved balance and reduced risk for falls    Baseline 18/30 baseline, 21/30 on 6/18    Time 6    Period Weeks    Status Revised    Target Date 07/27/20      Additional Long Term Goals   Additional Long Term Goals Yes      PT LONG TERM GOAL #6   Title Pt will demonstrate ability to perform all 4 conditions of MCTSIB for 30 seconds, with minimal sway to indicate improved sensory integration and improved balance.    Time 6    Period Weeks    Status New    Target Date 07/27/20                 Plan - 07/11/20 1009    Clinical Impression Statement Treatment session focused on further evaluation of patient's symptoms and addressing upper cervical tension with TDN and manual therapy.  Pt tolerated well and reported improvement in headache today.  Will continue to address muscles in suboccipital region and temporalis if approved by PCP due to skin infection.  Pt agreeable to plan.    Personal Factors and Comorbidities Comorbidity 3+;Time since onset of injury/illness/exacerbation    Comorbidities BPH, prediabetes, dizziness, hypertension, hyperlipidemia, Sleep Apnea, Depression,  Obesity    Examination-Activity Limitations Bend;Lift    Examination-Participation Restrictions Driving;Other   Work   [employer] Evolving/Moderate complexity    Rehab Potential Good    PT Frequency 2x / week    PT Duration 6 weeks    PT Treatment/Interventions ADLs/Self Care Home Management;Canalith Repostioning;Cryotherapy;Moist Heat;Gait training;Stair training;Functional mobility training;Therapeutic activities;Therapeutic exercise;Balance training;Neuromuscular re-education;Patient/family education;Vestibular;Manual techniques;Dry needling;Passive range of motion    PT Next Visit Plan Did doctor respond about skin infection and TDN?  TDN to temporalis/subocciput.  Continue STM/Suboccipital Release. Balance actvities on Incline. Update HEP as needed. Continue habituation and gaze exercises.    Consulted and Agree with Plan of Care Patient           Patient will benefit from skilled therapeutic intervention in order to improve the following deficits and impairments:  Abnormal gait, Decreased balance, Decreased mobility, Difficulty walking, Impaired vision/preception, Dizziness, Decreased cognition, Decreased activity tolerance, Decreased strength, Pain  Visit Diagnosis: Cervicalgia  Dizziness and giddiness     Problem List Patient Active Problem List   Diagnosis Date Noted  . Obstructive sleep apnea hypopnea, severe 03/16/2020  . 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  . Excessive daytime sleepiness 02/13/2020  . Chronic fatigue 02/13/2020  . BPH with obstruction/lower urinary tract symptoms   . Prediabetes 10/11/2018  . Former smoker 10/11/2018  . Family history of ischemic heart disease 10/11/2018  . FH: hypertension 03/23/2018  . Crushing injury of left elbow and forearm 07/13/2015  . Depression, major, in partial remission ([place]) 07/12/2015  . Medication management  02/08/2014  . Essential hypertension   . Hyperlipidemia, mixed   . OSA on CPAP   .  Abnormal glucose   . Morbid obesity ([place])   . Vitamin D deficiency     Rico Junker, PT, DPT 07/11/20    10:18 AM    [facility] [address] [place], [place], [address] Phone: [phone]   Fax:  [phone]  Name: Shane Saunders MRN: [id number] Date of Birth: 30-Dec-1970

## 2020-07-13 ENCOUNTER — Encounter: Payer: Self-pay | Admitting: Physical Therapy

## 2020-07-13 ENCOUNTER — Ambulatory Visit: Payer: BC Managed Care – PPO | Admitting: Physical Therapy

## 2020-07-13 ENCOUNTER — Other Ambulatory Visit: Payer: Self-pay

## 2020-07-13 DIAGNOSIS — R2681 Unsteadiness on feet: Secondary | ICD-10-CM | POA: Diagnosis not present

## 2020-07-13 DIAGNOSIS — M542 Cervicalgia: Secondary | ICD-10-CM
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
## 2020-07-18 ENCOUNTER — Ambulatory Visit: Payer: BC Managed Care – PPO

## 2020-07-18 ENCOUNTER — Other Ambulatory Visit: Payer: Self-pay

## 2020-07-18 DIAGNOSIS — M6281 Muscle weakness (generalized): Secondary | ICD-10-CM

## 2020-07-18 DIAGNOSIS — R2681 Unsteadiness on feet: Secondary | ICD-10-CM | POA: Diagnosis not present

## 2020-07-18 DIAGNOSIS — R42 Dizziness and giddiness: Secondary | ICD-10-CM

## 2020-07-18 DIAGNOSIS — R2689 Other abnormalities of gait and mobility: Secondary | ICD-10-CM

## 2020-07-18 DIAGNOSIS — M542 Cervicalgia: Secondary | ICD-10-CM

## 2020-07-18 NOTE — Therapy (Signed)
[facility] [address] [place], [place], [address] Phone: [phone]   Fax:  [phone]  Physical Therapy Treatment  Patient Details  Name: Shane Saunders MRN: [id number] Date of Birth: 1971/09/26 Referring Provider (PT): Andrey Spearman, MD   Encounter Date: 07/18/2020   PT End of Session - 07/18/20 1127    Visit Number 19    Number of Visits 23    Date for PT Re-Evaluation 53/20/23   new cert - POC 6 weeks, cert 90 days   Authorization Type BCBS    PT Start Time [phone]    PT Stop Time 0937    PT Time Calculation (min) 50 min    Equipment Utilized During Treatment Gait belt    Activity Tolerance Patient tolerated treatment well    Behavior During Therapy [facility] for tasks assessed/performed           Past Medical History:  Diagnosis Date  . Abnormal glucose   . Borderline hypertension   . BPH (benign prostatic hypertrophy)   . Crushing injury of left elbow and forearm 07/13/2015  . Depression, major, in partial remission ([place]) 07/12/2015  . Dizziness   . Hyperlipidemia   . Hypertension   . Hypogonadism male   . 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 Morbid obesity ([place])   . Obesity (BMI 30-39.9)   . OSA on CPAP   . Prediabetes   . Right ureteral stone   . Sleep apnea    C PAP  . Vertigo   . Vitamin D deficiency   . Wears glasses     Past Surgical History:  Procedure Laterality Date  . 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 ARTERY REPAIR Right 05/31/2014   Procedure: IRRIGATION, EXPLORATION, AND REPAIR OF RIGHT ARM WOUND;  Surgeon: Gwenyth Ober, MD;  Location: [facility];  Service: General;  Laterality: Right;  . CYSTOSCOPY WITH RETROGRADE PYELOGRAM, URETEROSCOPY AND STENT PLACEMENT Right 07/11/2016   Procedure: CYSTOSCOPY WITH RETROGRADE PYELOGRAM, URETEROSCOPY, BASKET STONE EXTRACTION  AND STENT  PLACEMENT;  Surgeon: Alexis Frock, MD;  Location: [facility];  Service: Urology;  Laterality: Right;  45 MINS  C-ARM DIGITAL URETEROSCOPE HOLMIUM LASER  . ESOPHAGOGASTRODUODENOSCOPY      There were no vitals filed for this visit.   Subjective Assessment - 07/18/20 0849    Subjective Patient reports that believes dry needling helped with shoulders, but did not help with headache. Reports was painting a post and got dizzy within 2-3 minutes.    Pertinent History BPH, prediabetes, dizziness, hypertension, hyperlipidemia, Sleep Apnea, Depression, Obesity    Diagnostic tests results of MRI: cervical and lumbar spine unremarkable    Currently in Pain? Yes    Pain Score 3     Pain Location Head    Pain Descriptors / Indicators Nagging;Headache    Pain Type Chronic pain                   Vestibular Assessment - 07/18/20 0001      Dix-Hallpike Left   Dix-Hallpike Left Duration Due to patient reports of dizziness and slight nausea, PT reassesed left Diz Hallpike. No dizziness/nystagmus with completion    Dix-Hallpike Left Symptoms No nystagmus                    [facility] Adult PT Treatment/Exercise -  07/18/20 0001      Ambulation/Gait   Ambulation/Gait Yes    Ambulation/Gait Assistance 5: Supervision    Ambulation/Gait Assistance Details throughout therapy with activites    Assistive device None    Gait Pattern Step-through pattern;Wide base of support    Ambulation Surface Level;Indoor      Self-Care   Self-Care Other Self-Care Comments    Other Self-Care Comments  PT spent extensive time speaking with patient about symptoms as patient is demonstrating frustration due to being unable to currently work at this time. Patient continues to believe something is going on despite medical professional unable to indentify any abnormal finding. Patient reporting seeking second opinion, with PT providing guidance.        Neuro Re-ed    Neuro Re-ed Details   Completed walking with tennis ball self toss, with focus on visual tracking and higher throws, completed x 3 laps in therapy gym with mild dizziness increases. no instances of imbalance noted.  Due to motion sensitivity, completed R/L nose to knee in seated position, 1 x 15 reps each direction. Progressed to completed bending down to touch target and coming back up to neutral standing. Completed x 10 reps each direction. Patient has increased dizziness with increased speed of movement with rising up to standing.                     PT Short Term Goals - 07/06/20 0952      PT SHORT TERM GOAL #1   Title Patient will be independent with vestibular/balance HEP    Baseline Pt verbalizes independence    Time 3    Period Weeks    Status Achieved    Target Date 07/06/20      PT SHORT TERM GOAL #2   Title Pt will demonstrate ability to perform first 2 conditions of MCTSIB for 30 seconds, with minimal sway to indicate improved sensory integration and improved balance.    Baseline Pt complete situation 1 for 30 seconds, sitation 2 for 20-25 seconds at this time.    Time 3    Period Weeks    Status Partially Met    Target Date 07/06/20             PT Long Term Goals - 06/15/20 0849      PT LONG TERM GOAL #1   Title Patient will be independent with final vestibular/balance HEP    Baseline Patient reports independence with HEP, continue to progress.    Time 6    Period Weeks    Status Revised    Target Date 07/27/20      PT LONG TERM GOAL #2   Title Patient will improve DHI by 10 points    Baseline 48/100    Time 6    Period Weeks    Status New    Target Date 07/27/20      PT LONG TERM GOAL #3   Title Patient will completed TUG in </= 10 secs to demonstrate reduced risk for falls    Baseline 11.91, 10.23    Time 6    Period Weeks    Status On-going    Target Date 07/27/20      PT LONG TERM GOAL #4   Title Pt will report at least 50% improvement in symptoms regarding  dizziness    Baseline Pt reports minimal to no improvement in headaches, but does report some improvement in dizziness. Unable to determine % change.  Time 6    Period Weeks    Status On-going    Target Date 07/27/20      PT LONG TERM GOAL #5   Title Patient will improve FGA to >/= 23/30 to demonstrate improved balance and reduced risk for falls    Baseline 18/30 baseline, 21/30 on 6/18    Time 6    Period Weeks    Status Revised    Target Date 07/27/20      Additional Long Term Goals   Additional Long Term Goals Yes      PT LONG TERM GOAL #6   Title Pt will demonstrate ability to perform all 4 conditions of MCTSIB for 30 seconds, with minimal sway to indicate improved sensory integration and improved balance.    Time 6    Period Weeks    Status New    Target Date 07/27/20                 Plan - 07/18/20 1127    Clinical Impression Statement Continued activites to to work on habituation due to motion sensitivity, patient demo most dizziness with increased speed of movements. PT spending extensive time educating patient on symptoms, motion sensitivty. Patient frustrated due to not being able to work, and frustrated due to not recieving answers in regards to medical cause. Patient reporting potential to get second medical opinion. Patient will continue to benefit from skilled PT services to progress toward goals.    Personal Factors and Comorbidities Comorbidity 3+;Time since onset of injury/illness/exacerbation    Comorbidities BPH, prediabetes, dizziness, hypertension, hyperlipidemia, Sleep Apnea, Depression, Obesity    Examination-Activity Limitations Bend;Lift    Examination-Participation Restrictions Driving;Other   Work   Merchant [employer] officer Evolving/Moderate complexity    Rehab Potential Good    PT Frequency 2x / week    PT Duration 6 weeks    PT Treatment/Interventions ADLs/Self Care Home Management;Canalith Repostioning;Cryotherapy;Moist Heat;Gait  training;Stair training;Functional mobility training;Therapeutic activities;Therapeutic exercise;Balance training;Neuromuscular re-education;Patient/family education;Vestibular;Manual techniques;Dry needling;Passive range of motion    PT Next Visit Plan Continue STM/Suboccipital Release and neck retraction strengthening. Balance actvities on Incline. Update HEP as needed. Continue habituation and gaze exercises.    Consulted and Agree with Plan of Care Patient           Patient will benefit from skilled therapeutic intervention in order to improve the following deficits and impairments:  Abnormal gait, Decreased balance, Decreased mobility, Difficulty walking, Impaired vision/preception, Dizziness, Decreased cognition, Decreased activity tolerance, Decreased strength, Pain  Visit Diagnosis: Cervicalgia  Dizziness and giddiness  Unsteadiness on feet  Other abnormalities of gait and mobility  Muscle weakness (generalized)     Problem List Patient Active Problem List   Diagnosis Date Noted  . Obstructive sleep apnea hypopnea, severe 03/16/2020  . 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  . Excessive daytime sleepiness 02/13/2020  . Chronic fatigue 02/13/2020  . BPH with obstruction/lower urinary tract symptoms   . Prediabetes 10/11/2018  . Former smoker 10/11/2018  . Family history of ischemic heart disease 10/11/2018  . FH: hypertension 03/23/2018  . Crushing injury of left elbow and forearm 07/13/2015  . Depression, major, in partial remission ([place]) 07/12/2015  . Medication management 02/08/2014  . Essential hypertension   . Hyperlipidemia, mixed   . OSA on CPAP   . Abnormal glucose   . Morbid obesity ([place])   . Vitamin D deficiency     Jones Bales, PT, DPT 07/18/2020, 11:33 AM  [facility] [address]  [place], [place], [address] Phone: [phone]   Fax:  [phone]  Name: Shane Saunders MRN:  [id number] Date of Birth: Jul 20, 1971

## 2020-07-20 ENCOUNTER — Ambulatory Visit: Payer: BC Managed Care – PPO

## 2020-07-20 ENCOUNTER — Other Ambulatory Visit: Payer: Self-pay

## 2020-07-20 DIAGNOSIS — R42 Dizziness and giddiness: Secondary | ICD-10-CM

## 2020-07-20 DIAGNOSIS — R2689 Other abnormalities of gait and mobility: Secondary | ICD-10-CM

## 2020-07-20 DIAGNOSIS — M6281 Muscle weakness (generalized): Secondary | ICD-10-CM

## 2020-07-20 DIAGNOSIS — M542 Cervicalgia: Secondary | ICD-10-CM

## 2020-07-20 DIAGNOSIS — R2681 Unsteadiness on feet: Secondary | ICD-10-CM | POA: Diagnosis not present

## 2020-07-20 NOTE — Therapy (Signed)
[facility] [address] [place], [place], [address] Phone: [phone]   Fax:  [phone]  Physical Therapy Treatment  Patient Details  Name: Shane Saunders MRN: [id number] Date of Birth: 09-12-1971 Referring Provider (PT): Joycelyn Schmid, MD   Encounter Date: 07/20/2020   PT End of Session - 07/20/20 0843    Visit Number 20    Number of Visits 23    Date for PT Re-Evaluation 03/18/21   new cert - POC 6 weeks, cert 90 days   Authorization Type BCBS    PT Start Time 0800    PT Stop Time 0845    PT Time Calculation (min) 45 min    Equipment Utilized During Treatment Gait belt    Activity Tolerance Patient tolerated treatment well    Behavior During Therapy [facility] for tasks assessed/performed           Past Medical History:  Diagnosis Date  . Abnormal glucose   . Borderline hypertension   . BPH (benign prostatic hypertrophy)   . Crushing injury of left elbow and forearm 07/13/2015  . 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 Dizziness   . Hyperlipidemia   . Hypertension   . Hypogonadism male   . 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 Morbid obesity ([facility])   . Obesity (BMI 30-39.9)   . OSA on CPAP   . Prediabetes   . Right ureteral stone   . Sleep apnea    C PAP  . Vertigo   . Vitamin D deficiency   . Wears glasses     Past Surgical History:  Procedure Laterality Date  . 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 ARTERY REPAIR Right 05/31/2014   Procedure: IRRIGATION, EXPLORATION, AND REPAIR OF RIGHT ARM WOUND;  Surgeon: Cherylynn Ridges, MD;  Location: [facility];  Service: General;  Laterality: Right;  . CYSTOSCOPY WITH RETROGRADE PYELOGRAM, URETEROSCOPY AND STENT PLACEMENT Right 07/11/2016   Procedure: CYSTOSCOPY WITH RETROGRADE PYELOGRAM, URETEROSCOPY, BASKET STONE EXTRACTION  AND STENT  PLACEMENT;  Surgeon: Sebastian Ache, MD;  Location: [facility];  Service: Urology;  Laterality: Right;  45 MINS  C-ARM DIGITAL URETEROSCOPE HOLMIUM LASER  . ESOPHAGOGASTRODUODENOSCOPY      There were no vitals filed for this visit.   Subjective Assessment - 07/20/20 0807    Subjective Patient reports no new changes/complaints since last visit. Headache this morning woke him up this morning, reports has eased off some now.    Pertinent History BPH, prediabetes, dizziness, hypertension, hyperlipidemia, Sleep Apnea, Depression, Obesity    Diagnostic tests results of MRI: cervical and lumbar spine unremarkable    Currently in Pain? Yes    Pain Score --   1.5   Pain Location Head    Pain Orientation --   top of head   Pain Descriptors / Indicators Nagging;Headache    Pain Type Chronic pain                             [facility] Adult PT Treatment/Exercise - 07/20/20 0001      Ambulation/Gait   Ambulation/Gait Yes    Ambulation/Gait Assistance 5: Supervision    Ambulation/Gait Assistance Details throughout therapy gym with actvities    Assistive device None    Gait Pattern Step-through pattern;Wide base of  support    Ambulation Surface Level;Indoor      Neuro Re-ed    Neuro Re-ed Details  For habituation: gave sitting to L sidelying x 5 reps. Slight increase in dizziness with completion. Educated on proper completion at home.                Balance Exercises - 07/20/20 0001      Balance Exercises: Standing   Standing Eyes Opened Narrow base of support (BOS);Head turns;Foam/compliant surface;Limitations    Standing Eyes Opened Limitations completed 2 x 10 reps of horizontal/vertical head turns on airex. Increased difficulty with horizontal head turns > vertical head turns.  Patient demo increased sway posterior with completion.     Stepping Strategy Anterior;Posterior;Foam/compliant surface;Limitations    Stepping Strategy Limitations completed  forward alternating stepping off balance beam, 1 x 10 reps. Then completed posterior stepping off balance beam, 1 x 10 reps. Pt demo increased difficulty with posterior stepping. Requiring intermittent UE support and CGA for steadying.    Other Standing Exercises Completed balance activites on incline: positioned downward on incline, focused on intiailly completing static standing to gain balance, then PT providing target to reach forward x 5 reps and return to midline with maintaining balance throughout. Patient demo increased posterior sway with CGA required.              PT Education - 07/20/20 1300    Education Details HEP addition (sitting to L sidelying)    Person(s) Educated Patient;Spouse    Methods Explanation;Demonstration    Comprehension Verbalized understanding;Returned demonstration            PT Short Term Goals - 07/06/20 0952      PT SHORT TERM GOAL #1   Title Patient will be independent with vestibular/balance HEP    Baseline Pt verbalizes independence    Time 3    Period Weeks    Status Achieved    Target Date 07/06/20      PT SHORT TERM GOAL #2   Title Pt will demonstrate ability to perform first 2 conditions of MCTSIB for 30 seconds, with minimal sway to indicate improved sensory integration and improved balance.    Baseline Pt complete situation 1 for 30 seconds, sitation 2 for 20-25 seconds at this time.    Time 3    Period Weeks    Status Partially Met    Target Date 07/06/20             PT Long Term Goals - 06/15/20 0849      PT LONG TERM GOAL #1   Title Patient will be independent with final vestibular/balance HEP    Baseline Patient reports independence with HEP, continue to progress.    Time 6    Period Weeks    Status Revised    Target Date 07/27/20      PT LONG TERM GOAL #2   Title Patient will improve DHI by 10 points    Baseline 48/100    Time 6    Period Weeks    Status New    Target Date 07/27/20      PT LONG TERM GOAL #3    Title Patient will completed TUG in </= 10 secs to demonstrate reduced risk for falls    Baseline 11.91, 10.23    Time 6    Period Weeks    Status On-going    Target Date 07/27/20      PT LONG TERM GOAL #4   Title  Pt will report at least 50% improvement in symptoms regarding dizziness    Baseline Pt reports minimal to no improvement in headaches, but does report some improvement in dizziness. Unable to determine % change.    Time 6    Period Weeks    Status On-going    Target Date 07/27/20      PT LONG TERM GOAL #5   Title Patient will improve FGA to >/= 23/30 to demonstrate improved balance and reduced risk for falls    Baseline 18/30 baseline, 21/30 on 6/18    Time 6    Period Weeks    Status Revised    Target Date 07/27/20      Additional Long Term Goals   Additional Long Term Goals Yes      PT LONG TERM GOAL #6   Title Pt will demonstrate ability to perform all 4 conditions of MCTSIB for 30 seconds, with minimal sway to indicate improved sensory integration and improved balance.    Time 6    Period Weeks    Status New    Target Date 07/27/20                 Plan - 07/20/20 1301    Clinical Impression Statement Continued balance exercises today, including head turns to target vestibular system. Also completed balance activites focused on improved stepping strategy, and improved limit of stability. Patient will continue to benefit from skilled PT services to progress toward goals.    Personal Factors and Comorbidities Comorbidity 3+;Time since onset of injury/illness/exacerbation    Comorbidities BPH, prediabetes, dizziness, hypertension, hyperlipidemia, Sleep Apnea, Depression, Obesity    Examination-Activity Limitations Bend;Lift    Examination-Participation Restrictions Driving;Other   Work   Conservation officer, historic buildings Evolving/Moderate complexity    Rehab Potential Good    PT Frequency 2x / week    PT Duration 6 weeks    PT Treatment/Interventions  ADLs/Self Care Home Management;Canalith Repostioning;Cryotherapy;Moist Heat;Gait training;Stair training;Functional mobility training;Therapeutic activities;Therapeutic exercise;Balance training;Neuromuscular re-education;Patient/family education;Vestibular;Manual techniques;Dry needling;Passive range of motion    PT Next Visit Plan Continue STM/Suboccipital Release. neck retraction strengthening. Balance actvities on Incline. Update HEP as needed. Continue habituation and gaze exercises.    Consulted and Agree with Plan of Care Patient           Patient will benefit from skilled therapeutic intervention in order to improve the following deficits and impairments:  Abnormal gait, Decreased balance, Decreased mobility, Difficulty walking, Impaired vision/preception, Dizziness, Decreased cognition, Decreased activity tolerance, Decreased strength, Pain  Visit Diagnosis: Dizziness and giddiness  Cervicalgia  Unsteadiness on feet  Other abnormalities of gait and mobility  Muscle weakness (generalized)     Problem List Patient Active Problem List   Diagnosis Date Noted  . Obstructive sleep apnea hypopnea, severe 03/16/2020  . 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  . Excessive daytime sleepiness 02/13/2020  . Chronic fatigue 02/13/2020  . BPH with obstruction/lower urinary tract symptoms   . Prediabetes 10/11/2018  . Former smoker 10/11/2018  . Family history of ischemic heart disease 10/11/2018  . FH: hypertension 03/23/2018  . Crushing injury of left elbow and forearm 07/13/2015  . 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 Medication management 02/08/2014  . Essential hypertension   . Hyperlipidemia, mixed   . OSA on CPAP   . Abnormal glucose   . Morbid obesity ([facility])   . Vitamin D deficiency     Tempie Donning, PT, DPT 07/20/2020, 1:05 PM  [facility]  [address] [place], [place],  [address] Phone: [phone]   Fax:  [phone]  Name: Shane Saunders MRN: [id number] Date of Birth: [birth year]

## 2020-07-23 ENCOUNTER — Ambulatory Visit: Payer: BC Managed Care – PPO

## 2020-07-25 ENCOUNTER — Ambulatory Visit: Payer: BC Managed Care – PPO | Admitting: Physical Therapy

## 2020-07-27 ENCOUNTER — Ambulatory Visit: Payer: BC Managed Care – PPO

## 2020-08-01 ENCOUNTER — Other Ambulatory Visit: Payer: Self-pay

## 2020-08-01 ENCOUNTER — Ambulatory Visit: Payer: BC Managed Care – PPO | Attending: Diagnostic Neuroimaging

## 2020-08-01 DIAGNOSIS — R42 Dizziness and giddiness: Secondary | ICD-10-CM | POA: Diagnosis not present

## 2020-08-01 DIAGNOSIS — M542 Cervicalgia: Secondary | ICD-10-CM | POA: Diagnosis present

## 2020-08-01 DIAGNOSIS — M6281 Muscle weakness (generalized): Secondary | ICD-10-CM | POA: Insufficient documentation

## 2020-08-01 DIAGNOSIS — R2689 Other abnormalities of gait and mobility: Secondary | ICD-10-CM | POA: Insufficient documentation

## 2020-08-01 DIAGNOSIS — R2681 Unsteadiness on feet: Secondary | ICD-10-CM | POA: Diagnosis present

## 2020-08-01 NOTE — Therapy (Signed)
[facility] [address] [place], [place], [address] Phone: [phone]   Fax:  [phone]  Physical Therapy Treatment/Discharge Summary  Patient Details  Name: Shane Saunders MRN: [id number] Date of Birth: 1971-02-11 Referring Provider (PT): Andrey Spearman, MD  PHYSICAL THERAPY DISCHARGE SUMMARY  Visits from Start of Care: 21  Current functional level related to goals / functional outcomes: See Clinical Impression Statement for Details   Remaining deficits: Fall Risk, Decreased Vestibular Function, Dizziness   Education / Equipment: Educated on [employer]. Potential to be re-evaluate by ENT/Neurologist  Plan: Patient agrees to discharge.  Patient goals were partially met. Patient is being discharged due to meeting the stated rehab goals.  ?????          Encounter Date: 08/01/2020   PT End of Session - 08/01/20 0803    Visit Number 21    Number of Visits 23    Date for PT Re-Evaluation 25/05/39   new cert - POC 6 weeks, cert 90 days   Authorization Type [employer]    PT Start Time [phone]    PT Stop Time 0845    PT Time Calculation (min) 48 min    Equipment Utilized During Treatment Gait belt    Activity Tolerance Patient tolerated treatment well    Behavior During Therapy WFL for tasks assessed/performed           Past Medical History:  Diagnosis Date  . Abnormal glucose   . Borderline hypertension   . BPH (benign prostatic hypertrophy)   . Crushing injury of left elbow and forearm 07/13/2015  . Depression, major, in partial remission ([place]) 07/12/2015  . Dizziness   . Hyperlipidemia   . Hypertension   . Hypogonadism male   . 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 Morbid obesity ([place])   . Obesity (BMI 30-39.9)   . OSA on CPAP   . Prediabetes   . Right ureteral stone   . Sleep apnea    C PAP  . Vertigo   . Vitamin D deficiency   . Wears glasses     Past Surgical History:   Procedure Laterality Date  . 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 ARTERY REPAIR Right 05/31/2014   Procedure: IRRIGATION, EXPLORATION, AND REPAIR OF RIGHT ARM WOUND;  Surgeon: Gwenyth Ober, MD;  Location: [facility];  Service: General;  Laterality: Right;  . CYSTOSCOPY WITH RETROGRADE PYELOGRAM, URETEROSCOPY AND STENT PLACEMENT Right 07/11/2016   Procedure: CYSTOSCOPY WITH RETROGRADE PYELOGRAM, URETEROSCOPY, BASKET STONE EXTRACTION  AND STENT PLACEMENT;  Surgeon: Alexis Frock, MD;  Location: [facility];  Service: Urology;  Laterality: Right;  45 MINS  C-ARM DIGITAL URETEROSCOPE HOLMIUM LASER  . ESOPHAGOGASTRODUODENOSCOPY      There were no vitals filed for this visit.   Subjective Assessment - 08/01/20 0759    Subjective Patient reports doing good since last visit. Patient reports taking care of father, increased headache due to altered sleep and stress. Has had some instances of increased dizziness in the last few days.    Pertinent History BPH, prediabetes, dizziness, hypertension, hyperlipidemia, Sleep Apnea, Depression, Obesity    Diagnostic tests results of MRI: cervical and lumbar spine unremarkable    Currently in Pain? No/denies              [facility] PT Assessment - 08/01/20 0001  Functional Gait  Assessment   Gait assessed  Yes    Gait Level Surface Walks 20 ft in less than 7 sec but greater than 5.5 sec, uses assistive device, slower speed, mild gait deviations, or deviates 6-10 in outside of the 12 in walkway width.    Change in Gait Speed Able to smoothly change walking speed without loss of balance or gait deviation. Deviate no more than 6 in outside of the 12 in walkway width.    Gait with Horizontal Head Turns Performs head turns smoothly with slight change in gait velocity (eg, minor disruption to smooth gait path), deviates 6-10 in outside 12 in walkway width, or uses an  assistive device.    Gait with Vertical Head Turns Performs head turns with no change in gait. Deviates no more than 6 in outside 12 in walkway width.    Gait and Pivot Turn Pivot turns safely within 3 sec and stops quickly with no loss of balance.    Step Over Obstacle Is able to step over 2 stacked shoe boxes taped together (9 in total height) without changing gait speed. No evidence of imbalance.    Gait with Narrow Base of Support Ambulates 7-9 steps.    Gait with Eyes Closed Walks 20 ft, uses assistive device, slower speed, mild gait deviations, deviates 6-10 in outside 12 in walkway width. Ambulates 20 ft in less than 9 sec but greater than 7 sec.    Ambulating Backwards Walks 20 ft, uses assistive device, slower speed, mild gait deviations, deviates 6-10 in outside 12 in walkway width.    Steps Alternating feet, no rail.    Total Score 25    FGA comment: 25/30                         [facility] Adult PT Treatment/Exercise - 08/01/20 0001      Ambulation/Gait   Ambulation/Gait Yes    Ambulation/Gait Assistance 5: Supervision    Ambulation/Gait Assistance Details throughout therapy gym with activities    Assistive device None    Gait Pattern Step-through pattern;Wide base of support    Ambulation Surface Level;Indoor      Standardized Balance Assessment   Standardized Balance Assessment Timed Up and Go Test      Timed Up and Go Test   TUG Normal TUG    Normal TUG (seconds) 7.44      Self-Care   Self-Care Other Self-Care Comments    Other Self-Care Comments  Patient completed [place] with patient scoring: 36/100, demonstrating 12 point improvement from last assessment (48/100)       Neuro Re-ed    Neuro Re-ed Details  Completed M-CTSIB: Situation 1: 30 secs , Situation 2: 30 secs , Situation 3: 20 secs, Situation 4: <5 seconds. Patient still demo increased difficulty with Situation 3 and 4.       Exercises   Exercises Other Exercises    Other Exercises  Reviewed  current vestibular/balance HEP with patient, and educating on importance of continuing to maintain progress achieved with PT services.                   PT Education - 08/01/20 1038    Education Details Educated on Progress toward LTG's, Continuing HEP, re-evaluation from ENT/Neurologist    Person(s) Educated Patient    Methods Explanation    Comprehension Verbalized understanding            PT Short Term Goals -  07/06/20 0952      PT SHORT TERM GOAL #1   Title Patient will be independent with vestibular/balance HEP    Baseline Pt verbalizes independence    Time 3    Period Weeks    Status Achieved    Target Date 07/06/20      PT SHORT TERM GOAL #2   Title Pt will demonstrate ability to perform first 2 conditions of MCTSIB for 30 seconds, with minimal sway to indicate improved sensory integration and improved balance.    Baseline Pt complete situation 1 for 30 seconds, sitation 2 for 20-25 seconds at this time.    Time 3    Period Weeks    Status Partially Met    Target Date 07/06/20             PT Long Term Goals - 08/01/20 0803      PT LONG TERM GOAL #1   Title Patient will be independent with final vestibular/balance HEP    Baseline Patient reports independence with HEP    Time 6    Period Weeks    Status Achieved      PT LONG TERM GOAL #2   Title Patient will improve DHI by 10 points    Baseline 48/100, 36/100    Time 6    Period Weeks    Status Achieved      PT LONG TERM GOAL #3   Title Patient will completed TUG in </= 10 secs to demonstrate reduced risk for falls    Baseline 11.91, 10.23, 7.44    Time 6    Period Weeks    Status Achieved      PT LONG TERM GOAL #4   Title Pt will report at least 50% improvement in symptoms regarding dizziness    Baseline Patient reports 10% improvement in dizziness    Time 6    Period Weeks    Status Not Met      PT LONG TERM GOAL #5   Title Patient will improve FGA to >/= 23/30 to demonstrate improved  balance and reduced risk for falls    Baseline 18/30 baseline, 21/30 on 6/18, 25/30 on 8/4    Time 6    Period Weeks    Status Achieved      PT LONG TERM GOAL #6   Title Pt will demonstrate ability to perform all 4 conditions of MCTSIB for 30 seconds, with minimal sway to indicate improved sensory integration and improved balance.    Baseline Patient able to hold situation 1 and 2 for 30 seconds, situation 3: 20 seconds, Situation 4: <5 seconds    Time 6    Period Weeks    Status Partially Met                 Plan - 08/01/20 1044    Clinical Impression Statement Completed assessment of patient's progress toward all LTG's. Patient able to meet 4 out of 6 LTG during today's session. Patient did not meet LTG #4, due to patient reporting mild improvements in dizziness overall (approx 10%), however demonstrated improvement in [place] to 36/100. Patient also partially meeting LTG #6, due to patient still demonstrating difficulty with situation 3 and 4 on M-CTSIB. Patient has demosntrated significant improvements in balance, however minimal improvements in dizziness. Patient still demonstrates intermittent dizziness currently, but does report that it takes more for the dizziness to be excerabeted versus when starting PT services. PT verbalizing readiness for discharge, with patient expressing  verbal agreement. PT educating on potential to be re-evaluated by ENT/Neurology at this time.    Personal Factors and Comorbidities Comorbidity 3+;Time since onset of injury/illness/exacerbation    Comorbidities BPH, prediabetes, dizziness, hypertension, hyperlipidemia, Sleep Apnea, Depression, Obesity    Examination-Activity Limitations Bend;Lift    Examination-Participation Restrictions Driving;Other   Work   [employer] Evolving/Moderate complexity    Rehab Potential Good    PT Frequency 2x / week    PT Duration 6 weeks    PT Treatment/Interventions ADLs/Self Care Home  Management;Canalith Repostioning;Cryotherapy;Moist Heat;Gait training;Stair training;Functional mobility training;Therapeutic activities;Therapeutic exercise;Balance training;Neuromuscular re-education;Patient/family education;Vestibular;Manual techniques;Dry needling;Passive range of motion    PT Next Visit Plan Continue STM/Suboccipital Release. neck retraction strengthening. Balance actvities on [place]. Update HEP as needed. Continue habituation and gaze exercises.    Consulted and Agree with Plan of Care Patient           Patient will benefit from skilled therapeutic intervention in order to improve the following deficits and impairments:  Abnormal gait, Decreased balance, Decreased mobility, Difficulty walking, Impaired vision/preception, Dizziness, Decreased cognition, Decreased activity tolerance, Decreased strength, Pain  Visit Diagnosis: Dizziness and giddiness  Cervicalgia  Unsteadiness on feet  Other abnormalities of gait and mobility  Muscle weakness (generalized)     Problem List Patient Active Problem List   Diagnosis Date Noted  . Obstructive sleep apnea hypopnea, severe 03/16/2020  . 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  . Excessive daytime sleepiness 02/13/2020  . Chronic fatigue 02/13/2020  . BPH with obstruction/lower urinary tract symptoms   . Prediabetes 10/11/2018  . Former smoker 10/11/2018  . Family history of ischemic heart disease 10/11/2018  . FH: hypertension 03/23/2018  . Crushing injury of left elbow and forearm 07/13/2015  . Depression, major, in partial remission ([place]) 07/12/2015  . Medication management 02/08/2014  . Essential hypertension   . Hyperlipidemia, mixed   . OSA on CPAP   . Abnormal glucose   . Morbid obesity ([place])   . Vitamin D deficiency     Jones Bales, PT, DPT 08/01/2020, 10:53 AM  [facility] [address] [place], [place], [address] Phone: [phone]    Fax:  [phone]  Name: Shane Saunders MRN: [id number] Date of Birth: 09/22/1971

## 2020-08-19 NOTE — Progress Notes (Signed)
Assessment and Plan:  Cognitive dysfunction -     CBC with Differential/Platelet -     COMPLETE METABOLIC PANEL WITH GFR -     TSH - cut the wellbutrin in half  Presyncopal episodes/dizziness with standing with palpitations Has had normal echo, stress test, vestibular testing, EEG's, MRI, CTA's.  Increase salt, increase gatorade, get compression socks Will adjust medications Episodes started after facial swelling that is controlled with valacyclovir.  With BP supine 118/68, HR 64, BP sitting 112/72 HR 68,  Standing BP 100/70 and HR 98. Will refer back to cardiology for possible tilt test to rule out POTS after a viral syndrome. He has several of the symptoms for this and has yet to have a tilt test done. Patient at this time is very frustrated with testing/care.  May need to follow up with rehab for balance/gait training.  -     tamsulosin (FLOMAX) 0.4 MG CAPS capsule; Take 1 capsule (0.4 mg total) by mouth daily after supper.  Multiple neurological symptoms -     CBC with Differential/Platelet -     COMPLETE METABOLIC PANEL WITH GFR -     TSH  Essential hypertension -     Insulin, random -     Cortisol  Fatigue, unspecified type -     Insulin, random -     Cortisol  Medications Discontinued During This Encounter  Medication Reason   nystatin (MYCOSTATIN) 500000 units TABS tablet Patient Preference   gabapentin (NEURONTIN) 800 MG tablet    verapamil (CALAN-SR) 180 MG CR tablet Patient Preference   silodosin (RAPAFLO) 8 MG CAPS capsule    buPROPion (WELLBUTRIN XL) 300 MG 24 hr tablet     Future Appointments  Date Time Provider [place]  10/17/2020  3:00 PM Debbora Presto, NP [facility] None  11/20/2020  9:30 AM Unk Pinto, MD [facility] None  12/04/2020  9:00 AM Debbora Presto, NP [facility] None  05/08/2021  2:00 PM Unk Pinto, MD [facility] None     HPI 49 y.o.male presents for personal reasons of continuing dizziness/imbalance and some presyncopal  episodes- he has had symptoms intermittent x close to 2 years with facial swelling that is improved with acyclovir.  Marland Kitchen  He has a complicated history between Nov  2020 to present, patient has presented to 9 different providers with c/o  Rt facial & lip swelling, facial paresthesias, unsteady gait.  He has seen  Dermatologists, ENT Doctors, Sleep Specialists, Cardiologists, Neurologists with numerous Normal studies including neck and head CTA &and a Brain MRI, ENG's, EEG's and vestibular testing that was normal  He's alsonegative labs forCBC, ESR, hsCRP, ANCA, total Compliment and C3 &C4, Anti-Smith, HLA-B 27 Ag, Angiotensin converting enzyme and C1 Esterase Inhibitor.   Patient had Bx his lip to r/o Granulomatosis Cheilitis ([place]). Patient is following with Dr Leta Baptist, thought to have migraine with aura and depression, medications have been changed and he is on migraine prevention medication that he states has not helped. He states rehab has helped the most but it is short lived.   He presents today complaining of balance issues, still getting dizzy. States he does have orthostatic hypotension with recent therapy that he has had and palpitations with standing. With any change in position he will feel unsteady, and from squatting to standing will feel palpitations, sweating occ and dizziness, has had 3 episodes in last month of presyncopal episodes with standing that he had to let himself fall.    Symptoms improve with  flat and still.  °He is off verapamil and not on any beta blockers at this time.  ° °Normal stress test nishan 04/09/2020 °Patient Active Problem List  ° Diagnosis Date Noted  °• Obstructive sleep apnea hypopnea, severe 03/16/2020  °• 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  °• Excessive daytime sleepiness 02/13/2020  °• Chronic fatigue 02/13/2020  °• BPH with obstruction/lower urinary tract symptoms   °• Prediabetes 10/11/2018  °• Former smoker 10/11/2018  °• Family  history of ischemic heart disease 10/11/2018  °• FH: hypertension 03/23/2018  °• Crushing injury of left elbow and forearm 07/13/2015  °• Depression, major, in partial remission ([facility]) 07/12/2015  °• Medication management 02/08/2014  °• Essential hypertension   °• Hyperlipidemia, mixed   °• OSA on CPAP   °• Abnormal glucose   °• Morbid obesity ([facility])   °• Vitamin D deficiency   ° ° ° ° °Current Outpatient Medications (Cardiovascular):  °•  atorvastatin (LIPITOR) 80 MG tablet, TAKE 1 TABLET BY MOUTH EVERY DAY ° °Current Outpatient Medications (Respiratory):  °•  cetirizine-pseudoephedrine (ZYRTEC-D) 5-120 MG tablet, Takes 1 tablet 2 x/day as needed - congestion °•  fexofenadine (ALLEGRA) 60 MG tablet, Takes 1 tablet 2 x /day as needed for Allergies ° °Current Outpatient Medications (Analgesics):  °•  Erenumab-aooe 140 MG/ML SOAJ, Inject 140 mg into the skin every 30 (thirty) days. °•  rizatriptan (MAXALT-MLT) 10 MG disintegrating tablet, Take 1 tablet (10 mg total) by mouth as needed for migraine. May repeat in 2 hours if needed °•  SUMAtriptan (IMITREX) 100 MG tablet, Take 1 tablet (100 mg total) by mouth once as needed for up to 1 dose for migraine. May repeat in 2 hours if headache persists or recurs. ° °Current Outpatient Medications (Hematological):  °•  Cyanocobalamin (VITAMIN B 12 PO), Take 1 tablet by mouth daily. ° °Current Outpatient Medications (Other):  °•  ALPRAZolam (XANAX) 1 MG tablet, TAKE 1/2 TO 1 TABLET BY MOUTH 2 OR 3 TIMES DAILY ONLY IF NEEDED FOR ANXIETY. LIMIT 5 DAYS/WEEK °•  Cholecalciferol (VITAMIN D3) 250 MCG (10000 UT) TABS, Take 10,000 Units by mouth daily. °•  doxycycline (VIBRAMYCIN) 100 MG capsule, Take 1 capsule 2 x  /day with food for skin Infection °•  escitalopram (LEXAPRO) 20 MG tablet, Take 1 tablet Daily for Mood °•  valACYclovir (VALTREX) 1000 MG tablet, Take 1 tablet by mouth 2 (two) times daily. °•  buPROPion (WELLBUTRIN XL) 150 MG 24 hr tablet, Take 150 mg by mouth every  morning. °•  tamsulosin (FLOMAX) 0.4 MG CAPS capsule, Take 1 capsule (0.4 mg total) by mouth daily after supper. ° °Allergies  °Allergen Reactions  °• Codeine Other (See Comments)  °  "I cannot sleep"  °• Effexor [Venlafaxine] Other (See Comments)  °  "E.D."  °• Prednisone Other (See Comments)  °  "I cannot sleep"  °• Tramadol Hcl Itching  °• Zinc Nausea Only and Other (See Comments)  °  "Upsets my stomach"  ° ° °ROS: all negative except above.  ° °Physical Exam: °Filed Weights  ° 08/20/20 0951  °Weight: 249 lb (112.9 kg)  ° °Temp (!) 97.5 °F (36.4 °C)    Wt 249 lb (112.9 kg)    SpO2 97%    BMI 34.73 kg/m²  °General Appearance: Well nourished, in no apparent distress. °Eyes: PERRLA, EOMs, conjunctiva no swelling or erythema °Sinuses: No Frontal/maxillary tenderness °ENT/Mouth: Ext aud canals clear, TMs without erythema, bulging. No erythema, swelling, or exudate on post pharynx.  Tonsils not swollen or   or erythematous. Hearing normal.  Neck: Supple, thyroid normal.  Respiratory: Respiratory effort normal, BS equal bilaterally without rales, rhonchi, wheezing or stridor.  Cardio: RRR with no MRGs. Brisk peripheral pulses without edema.  Abdomen: Soft, + BS.  Non tender, no guarding, rebound, hernias, masses. Lymphatics: Non tender without lymphadenopathy.  Musculoskeletal: Full ROM, 5/5 strength, normal gait, + dizziness with sitting to standing.  Skin: Warm, dry without rashes, lesions, ecchymosis.  Neuro: Cranial nerves intact. Normal muscle tone, no cerebellar symptoms. Sensation intact.  Psych: Awake and oriented X 3, normal affect, Insight and Judgment appropriate.     Vicie Mutters, PA-C 12:30 PM [facility]

## 2020-08-20 ENCOUNTER — Other Ambulatory Visit: Payer: Self-pay | Admitting: Adult Health

## 2020-08-20 ENCOUNTER — Ambulatory Visit (INDEPENDENT_AMBULATORY_CARE_PROVIDER_SITE_OTHER): Payer: BC Managed Care – PPO | Admitting: Physician Assistant

## 2020-08-20 ENCOUNTER — Encounter: Payer: Self-pay | Admitting: Physician Assistant

## 2020-08-20 ENCOUNTER — Ambulatory Visit: Payer: BC Managed Care – PPO | Admitting: Adult Health

## 2020-08-20 ENCOUNTER — Other Ambulatory Visit: Payer: Self-pay

## 2020-08-20 VITALS — Temp 97.5°F | Wt 249.0 lb

## 2020-08-20 DIAGNOSIS — I10 Essential (primary) hypertension: Secondary | ICD-10-CM | POA: Diagnosis not present

## 2020-08-20 DIAGNOSIS — R2681 Unsteadiness on feet: Secondary | ICD-10-CM

## 2020-08-20 DIAGNOSIS — F09 Unspecified mental disorder due to known physiological condition: Secondary | ICD-10-CM

## 2020-08-20 DIAGNOSIS — R2990 Unspecified symptoms and signs involving the nervous system: Secondary | ICD-10-CM

## 2020-08-20 DIAGNOSIS — R002 Palpitations: Secondary | ICD-10-CM | POA: Diagnosis not present

## 2020-08-20 DIAGNOSIS — R55 Syncope and collapse: Secondary | ICD-10-CM

## 2020-08-20 DIAGNOSIS — F419 Anxiety disorder, unspecified: Secondary | ICD-10-CM

## 2020-08-20 DIAGNOSIS — R5383 Other fatigue: Secondary | ICD-10-CM

## 2020-08-20 MED ORDER — TAMSULOSIN HCL 0.4 MG PO CAPS: 0.4000 mg | ORAL_CAPSULE | Freq: Every day | ORAL | 3 refills | Status: DC

## 2020-08-20 NOTE — Patient Instructions (Addendum)
Stop the gabapentin Stop the rapaflo the prostate medication at bed time- try the tamsulosin at night instead.  Increase salt intake- add gatorade Get compression socks and try to wear them Cut the wellbutrin in half  May try to send for a tilt test  Or can try to refer to [facility]  COMPRESSION STOCKINGS  HOME CARE INSTRUCTIONS   Do not stand or sit in one position for long periods of time. Do not sit with your legs crossed. Rest with your legs raised during the day.  Your legs have to be higher than your heart so that gravity will force the valves to open, so please really elevate your legs.   Wear elastic stockings or support hose. Do not wear other tight, encircling garments around the legs, pelvis, or waist.  [employer]  has a wide variety of well priced compression stockings. [address] [place], [place] [address] [phone]  Or [employer] has a good cheap selection, I like the socks, they are not as hard to get on  Walk as much as possible to increase blood flow.  Raise the foot of your bed at night with 2-inch blocks. SEEK MEDICAL CARE IF:   The skin around your ankle starts to break down.  You have pain, redness, tenderness, or hard swelling developing in your leg over a vein.  You are uncomfortable due to leg pain. Document Released: 09/24/2005 Document Revised: 03/08/2012 Document Reviewed: 02/10/2011 [facility] Patient Information 2014 [place], [place].     Postural Orthostatic Tachycardia Syndrome Postural orthostatic tachycardia syndrome (POTS) is a group of symptoms that occur when a person stands up after lying down. POTS occurs when less blood than normal flows to the body when you stand up. The reduced blood flow to the body makes the heart beat rapidly. POTS may be associated with another medical condition, or it may occur on its own. What are the causes? The cause of this condition is not known, but many conditions and diseases are associated with  it. What increases the risk? This condition is more likely to develop in:  People who have certain conditions, such as: ? Infection from a virus. ? Attacks of healthy organs by the body's immunity (autoimmune disease). ? Losing a lot of red blood cells (anemia). ? Losing too much water in the body (dehydration). ? An overactive thyroid (hyperthyroidism).  People who take certain medicines.  People who have had a major injury.  People who have had surgery. What are the signs or symptoms? The most common symptom of this condition is light-headedness when one stands from a lying or sitting position. Other symptoms may include:  Feeling a rapid increase in the heartbeat (tachycardia) within 10 minutes of standing up.  Fainting.  Weakness.  Confusion.  Trembling.  Shortness of breath.  Sweating or flushing.  Headache.  Chest pain.  Breathing that is deeper and faster than normal (hyperventilation).  Nausea.  Anxiety. Symptoms may be worse in the morning, and they may be relieved by lying down. How is this diagnosed? This condition is diagnosed based on:  Your symptoms.  Your medical history.  A physical exam.  Checking your heart rate when you are lying down and after you stand up.  Checking your blood pressure when you go from lying down to standing up.  Blood tests to measure hormones that change with blood pressure. The blood tests will be done when you are lying down and when you are standing up. You may  have other tests to check for conditions or diseases that are associated with POTS. How is this treated? Treatment for this condition depends on how severe your symptoms are and whether you have any conditions or diseases that are associated with POTS. Treatment may involve:  Treating any conditions or diseases that are associated with POTS.  Drinking two glasses of water before getting up from a lying position.  Eating more salt (sodium).  Taking  medicine to control blood pressure and heart rate (beta-blocker).  Avoiding certain medicines.  Starting an exercise program under the supervision of a health care provider. Follow these instructions at home: Medicines  Take over-the-counter and prescription medicines only as told by your health care provider.  Let your health care provider know about all prescription or over-the-counter medicines. These include herbs, vitamins, and supplements. You may need to stop or adjust some medicines if they cause this condition.  Talk with your health care provider before starting any new medicines. Eating and drinking   Drink enough fluid to keep your urine pale yellow.  If told by your health care provider, drink two glasses of water before getting up from a lying position.  Follow instructions from your health care provider about how much sodium you should eat.  Avoid heavy meals. Eat several small meals a day instead of a few large meals. General instructions  Do an aerobic exercise for 20 minutes a day, at least 3 days a week.  Ask your health care provider what kinds of exercise are safe for you.  Do not use any products that contain nicotine or tobacco, such as cigarettes and e-cigarettes. These can interfere with blood flow. If you need help quitting, ask your health care provider.  Keep all follow-up visits as told by your health care provider. This is important. Contact a health care provider if:  Your symptoms do not improve after treatment.  Your symptoms get worse.  You develop new symptoms. Get help right away if:  You have chest pain.  You have difficulty breathing.  You have fainting episodes. These symptoms may represent a serious problem that is an emergency. Do not wait to see if the symptoms will go away. Get medical help right away. Call your local emergency services (911 in the [place] Do not drive yourself to the hospital. Summary  POTS is a condition  that can cause light-headedness, fainting, and palpitations when you go from a sitting or lying position to a standing position. It occurs when less blood than normal flows to the body when you stand up.  Treatment for this condition includes treating any underlying conditions, drinking plenty of water, stopping or changing some medicines, or starting an exercise program.  Get help right away if you have chest pain, difficulty breathing, or fainting episodes. These may represent a serious problem that is an emergency. This information is not intended to replace advice given to you by your health care provider. Make sure you discuss any questions you have with your health care provider. Document Revised: 01/26/2018 Document Reviewed: 01/26/2018 Elsevier Patient Education  2020 [employer].

## 2020-08-21 LAB — COMPLETE METABOLIC PANEL WITH GFR
AG Ratio: 2.1 (calc) (ref 1.0–2.5)
ALT: 25 U/L (ref 9–46)
AST: 22 U/L (ref 10–35)
Albumin: 4.7 g/dL (ref 3.6–5.1)
Alkaline phosphatase (APISO): 76 U/L (ref 35–144)
BUN: 11 mg/dL (ref 7–25)
CO2: 28 mmol/L (ref 20–32)
Calcium: 10.2 mg/dL (ref 8.6–10.3)
Chloride: 103 mmol/L (ref 98–110)
Creat: 1.26 mg/dL (ref 0.70–1.33)
GFR, Est African American: 77 mL/min/{1.73_m2} (ref >=60)
GFR, Est Non African American: 66 mL/min/{1.73_m2} (ref >=60)
Globulin: 2.2 g/dL (calc) (ref 1.9–3.7)
Glucose, Bld: 88 mg/dL (ref 65–99)
Potassium: 4.4 mmol/L (ref 3.5–5.3)
Sodium: 139 mmol/L (ref 135–146)
Total Bilirubin: 1.1 mg/dL (ref 0.2–1.2)
Total Protein: 6.9 g/dL (ref 6.1–8.1)

## 2020-08-21 LAB — CBC WITH DIFFERENTIAL/PLATELET
Absolute Monocytes: 655 cells/uL (ref 200–950)
Basophils Absolute: 60 cells/uL (ref 0–200)
Basophils Relative: 1.2 %
Eosinophils Absolute: 170 cells/uL (ref 15–500)
Eosinophils Relative: 3.4 %
HCT: 43.6 % (ref 38.5–50.0)
Hemoglobin: 15.5 g/dL (ref 13.2–17.1)
Lymphs Abs: 1200 cells/uL (ref 850–3900)
MCH: 33.6 pg — ABNORMAL HIGH (ref 27.0–33.0)
MCHC: 35.6 g/dL (ref 32.0–36.0)
MCV: 94.6 fL (ref 80.0–100.0)
MPV: 12.1 fL (ref 7.5–12.5)
Monocytes Relative: 13.1 %
Neutro Abs: 2915 cells/uL (ref 1500–7800)
Neutrophils Relative %: 58.3 %
Platelets: 88 10*3/uL — ABNORMAL LOW (ref 140–400)
RBC: 4.61 10*6/uL (ref 4.20–5.80)
RDW: 13.3 % (ref 11.0–15.0)
Total Lymphocyte: 24 %
WBC: 5 10*3/uL (ref 3.8–10.8)

## 2020-08-21 LAB — TSH: TSH: 2.07 mIU/L (ref 0.40–4.50)

## 2020-08-21 LAB — CORTISOL: Cortisol, Plasma: 14.3 ug/dL

## 2020-08-21 LAB — INSULIN, RANDOM: Insulin: 10.5 u[IU]/mL

## 2020-08-21 NOTE — Addendum Note (Signed)
Addended by: Doree Albee on: 08/21/2020 08:36 AM   Modules accepted: Orders

## 2020-08-26 ENCOUNTER — Other Ambulatory Visit: Payer: Self-pay | Admitting: Internal Medicine

## 2020-08-26 DIAGNOSIS — L739 Follicular disorder, unspecified: Secondary | ICD-10-CM

## 2020-09-09 ENCOUNTER — Encounter: Payer: Self-pay | Admitting: Cardiovascular Disease

## 2020-09-09 NOTE — Progress Notes (Signed)
Cardiology Office Note:    Date:  09/10/2020   ID:  Shane Saunders, DOB 1971-06-11, MRN [id number]  PCP:  Unk Pinto, MD  Cardiologist:  Rhondalyn Clingan  Electrophysiologist:  None   Referring MD: Vicie Mutters, PA-C   Chief Complaint  Patient presents with  . Hyperlipidemia  . Chest Pain     03/23/20   Shane Saunders is a 49 y.o. male with a hx of chest chest pain or shortness of breath.  He was seen by Pecolia Ades, NP 2 years ago.  I have never met him before. Creatinine has a history of obstructive sleep apnea and uses CPAP.  He has a vitamin D deficiency, hyperlipidemia.  He had another episode of chest pain last week and was evaluated in the emergency room.  He is here today for further follow-up.  Was seen in 2019 for orthostatic hypotension   orthostatic hypotension He reported multiple episodes of orthostatic hypotension and also dizziness while walking.  An event monitor was ordered in April, 2019 revealed only sinus rhythm including sinus bradycardia and sinus tachycardia.  No serious arrhythmias were seen.  frequent episodes of orthostasis   ,  Just last for a second or so . Does not last long enough for him to fall Has issues with balance  Has had issues with motor skills , neuro changes. Has discussed with his primary MD  Went to the ER last week with dyspnea, chest pressure,  Arm tingling, leg tingling. Feels cold chills.  No fever tsh is normal   Has not exercised in several months  Gets headaches with exercise    Becomes fatigues, Worked previously as a Building control surveyor, no longer working .    Has never had syncope   Lipids in Nov, 2020 show Trig level of 406 Has lost 20 + lbs over the past several months    Has some DOE ,  Is very deconditioned.  Admits that he does not sleep well   June 18, 2020:  Shane Saunders is seen back today for follow-up of his episodes of chest discomfort.  These episodes of chest pain last for a second or so.  He has a history of  sleep apnea and wears CPAP. Still having some balance issues,  Occasionally sound orthostatic but not always .  I do agree that his bp is on the low side    Frequent headaches  Is on verapamil but does not know why.   ?  Migraine  Ive asked him to discuss the verapamil with his primary and see if there is another migraine agent that will not lower his BP  Has seen neuro.  Was referred to vestibular rehab.  Has been wearing his CPAP.   Had repeat sleep study and his current CPAP is working well so his headaches are not likely to be coming from OSA .   Sept. 13, 2021  Shane Saunders is seen today for a work in visit He has atypical CP.   He was to see me on an as meeded basis  He now presents with worsening palpitations  Has had some dizziness.  Has orthostatic hypotensiion No syncope   Is in vestibular physical therapy.  Has presyncope , but no syncope  Walks on occasion.  Not on a specific schedule , perhaps 2-3 times a week.   Walks for 20 min each time   Was sent back to cardiology to consider POTS .  Drinks water through the day  No electrolyte replacement solutions  Not on any BP meds Takes tamsulosin for BPH . Is no longer working    Past Medical History:  Diagnosis Date  . Abnormal glucose   . Borderline hypertension   . BPH (benign prostatic hypertrophy)   . Crushing injury of left elbow and forearm 07/13/2015  . 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 Dizziness   . Hyperlipidemia   . Hypertension   . Hypogonadism male   . 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 Morbid obesity ([place])   . Obesity (BMI 30-39.9)   . OSA on CPAP   . Prediabetes   . Right ureteral stone   . Sleep apnea    C PAP  . Vertigo   . Vitamin D deficiency   . Wears glasses     Past Surgical History:  Procedure Laterality Date  . 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  ARTERY REPAIR Right 05/31/2014   Procedure: IRRIGATION, EXPLORATION, AND REPAIR OF RIGHT ARM WOUND;  Surgeon: Gwenyth Ober, MD;  Location: [facility];  Service: General;  Laterality: Right;  . CYSTOSCOPY WITH RETROGRADE PYELOGRAM, URETEROSCOPY AND STENT PLACEMENT Right 07/11/2016   Procedure: CYSTOSCOPY WITH RETROGRADE PYELOGRAM, URETEROSCOPY, BASKET STONE EXTRACTION  AND STENT PLACEMENT;  Surgeon: Alexis Frock, MD;  Location: [facility];  Service: Urology;  Laterality: Right;  45 MINS  C-ARM DIGITAL URETEROSCOPE HOLMIUM LASER  . ESOPHAGOGASTRODUODENOSCOPY      Current Medications: Current Meds  Medication Sig  . ALPRAZolam (XANAX) 1 MG tablet TAKE 1/2 TO 1 TABLET BY MOUTH 2 OR 3 TIMES DAILY ONLY IF NEEDED FOR ANXIETY. LIMIT 5 DAYS/WEEK  . atorvastatin (LIPITOR) 80 MG tablet TAKE 1 TABLET BY MOUTH EVERY DAY  . buPROPion (WELLBUTRIN XL) 150 MG 24 hr tablet Take 150 mg by mouth every morning.  . cetirizine-pseudoephedrine (ZYRTEC-D) 5-120 MG tablet Takes 1 tablet 2 x/day as needed - congestion  . Cholecalciferol (VITAMIN D3) 250 MCG (10000 UT) TABS Take 10,000 Units by mouth daily.  . Cyanocobalamin (VITAMIN B 12 PO) Take 1 tablet by mouth daily.  Marland Kitchen doxycycline (VIBRAMYCIN) 100 MG capsule Take 1 capsule    2 x /day     with Food for Skin Infection  . Erenumab-aooe 140 MG/ML SOAJ Inject 140 mg into the skin every 30 (thirty) days.  Marland Kitchen escitalopram (LEXAPRO) 20 MG tablet Take 1 tablet Daily for Mood  . fexofenadine (ALLEGRA) 60 MG tablet Takes 1 tablet 2 x /day as needed for Allergies  . tamsulosin (FLOMAX) 0.4 MG CAPS capsule Take 1 capsule (0.4 mg total) by mouth daily after supper.  . valACYclovir (VALTREX) 1000 MG tablet Take 1 tablet by mouth 2 (two) times daily.  . [DISCONTINUED] nystatin (MYCOSTATIN) 500000 units TABS tablet Take 1 tablet by mouth 2 (two) times daily.  . [DISCONTINUED] rizatriptan (MAXALT-MLT) 10 MG disintegrating tablet Take 1 tablet (10 mg total) by mouth as  needed for migraine. May repeat in 2 hours if needed  . [DISCONTINUED] SUMAtriptan (IMITREX) 100 MG tablet Take 1 tablet (100 mg total) by mouth once as needed for up to 1 dose for migraine. May repeat in 2 hours if headache persists or recurs.  . [DISCONTINUED] triamcinolone ointment (KENALOG) 0.1 % Apply 1 application topically 2 (two) times daily as needed.     Allergies:   Codeine, Effexor [venlafaxine], Prednisone, Tramadol hcl, and Zinc  Social History   Socioeconomic History  . Marital status: Married    Spouse name: Leafy Ro  . Number of children: 2  . Years of education: 34  . Highest education level: Not on file  Occupational History    Comment: na  Tobacco Use  . Smoking status: Former Smoker    Packs/day: 0.50    Years: 10.00    Pack years: 5.00    Types: Cigarettes    Quit date: 10/27/2009    Years since quitting: 10.8  . Smokeless tobacco: Former Systems developer    Types: Snuff  Vaping Use  . Vaping Use: Never used  Substance and Sexual Activity  . Alcohol use: Yes    Comment: occasional  . Drug use: No  . Sexual activity: Yes    Comment: Vasectomy  Other Topics Concern  . Not on file  Social History Narrative   Lives with wife   Caffeine -tea, 1 daily       Social Determinants of Health   Financial Resource Strain:   . Difficulty of Paying Living Expenses: Not on file  Food Insecurity:   . Worried About Charity fundraiser in the Last Year: Not on file  . Ran Out of Food in the Last Year: Not on file  Transportation Needs:   . Lack of Transportation (Medical): Not on file  . Lack of Transportation (Non-Medical): Not on file  Physical Activity:   . Days of Exercise per Week: Not on file  . Minutes of Exercise per Session: Not on file  Stress:   . Feeling of Stress : Not on file  Social Connections:   . Frequency of Communication with Friends and Family: Not on file  . Frequency of Social Gatherings with Friends and Family: Not on file  . Attends Religious  Services: Not on file  . Active Member of Clubs or Organizations: Not on file  . Attends Archivist Meetings: Not on file  . Marital Status: Not on file     Family History: The patient's family history includes CAD in his paternal grandfather; Cancer in his sister; Diabetes in his father; Hyperlipidemia in his father; Hypertension in his father and mother; Migraines in his mother; Thyroid disease in his mother.  ROS:   Please see the history of present illness.     All other systems reviewed and are negative.  EKGs/Labs/Other Studies Reviewed:    The following studies were reviewed today:   EKG:    May 07, 2020: Normal sinus rhythm.  He has no ST or T wave changes.  Recent Labs: 05/07/2020: Magnesium 2.2 08/20/2020: ALT 25; BUN 11; Creat 1.26; Hemoglobin 15.5; Platelets 88; Potassium 4.4; Sodium 139; TSH 2.07  Recent Lipid Panel    Component Value Date/Time   CHOL 184 05/07/2020 1417   TRIG 217 (H) 05/07/2020 1417   HDL 34 (L) 05/07/2020 1417   CHOLHDL 5.4 (H) 05/07/2020 1417   VLDL 34 (H) 05/27/2017 1622   LDLCALC 116 (H) 05/07/2020 1417    Physical Exam:    Physical Exam: Blood pressure 112/66, pulse 88, height $RemoveBe'5\' 10"'UKiaBfhvX$  (1.778 m), weight 243 lb 6.4 oz (110.4 kg), SpO2 95 %.  GEN:   Middle age male,  Moderately obese.  HEENT: Normal NECK: No JVD; No carotid bruits LYMPHATICS: No lymphadenopathy CARDIAC: RRR , no murmurs, rubs, gallops RESPIRATORY:  Clear to auscultation without rales, wheezing or rhonchi  ABDOMEN: Soft, non-tender, non-distended MUSCULOSKELETAL:  No edema; No deformity  SKIN: Warm  and dry NEUROLOGIC:  Alert and oriented x 3    ASSESSMENT:    1. Orthostatic hypotension    PLAN:      2.   Orthostatic hypotension  Advised more water intake, protein, electrolytes.  Suggested adding egg whites with salt in the am.  Discussed Liquid IV, Nun tablets, V-8 juice.  i've advised him to walk 3-4 times a day . He has lost weight ,  i've  congratulated him on his weight loss but advised that he needs to continue to lose weight .    He seemed doubtful that his symptoms were due volume depletion  I will have him see Dr. Caryl Comes for further evaluation of possible POTS .   Ill see him as needed   Medication Adjustments/Labs and Tests Ordered: Current medicines are reviewed at length with the patient today.  Concerns regarding medicines are outlined above.  Orders Placed This Encounter  Procedures  . Ambulatory referral to Cardiac Electrophysiology   No orders of the defined types were placed in this encounter.    Patient Instructions  Medication Instructions:  Your physician recommends that you continue on your current medications as directed. Please refer to the Current Medication list given to you today. *If you need a refill on your cardiac medications before your next appointment, please call your pharmacy*   Lab Work: None  If you have labs (blood work) drawn today and your tests are completely normal, you will receive your results only by: Marland Kitchen MyChart Message (if you have MyChart) OR . A paper copy in the mail If you have any lab test that is abnormal or we need to change your treatment, we will call you to review the results.   Testing/Procedures: None    Follow-Up: AS NEEDED with Dr. Acie Fredrickson  You have been referred to Dr. Caryl Comes for POTS evaluation    Other Instructions Use liquid IV  Or V-8 juice to replace your electrolytes.     Signed, Mertie Moores, MD  09/10/2020 11:25 AM    [place]

## 2020-09-10 ENCOUNTER — Other Ambulatory Visit: Payer: Self-pay

## 2020-09-10 ENCOUNTER — Ambulatory Visit: Payer: BC Managed Care – PPO | Admitting: Cardiovascular Disease

## 2020-09-10 ENCOUNTER — Encounter: Payer: Self-pay | Admitting: Cardiovascular Disease

## 2020-09-10 DIAGNOSIS — I951 Orthostatic hypotension: Secondary | ICD-10-CM | POA: Insufficient documentation

## 2020-09-10 NOTE — Patient Instructions (Addendum)
Medication Instructions:  Your physician recommends that you continue on your current medications as directed. Please refer to the Current Medication list given to you today. *If you need a refill on your cardiac medications before your next appointment, please call your pharmacy*   Lab Work: None  If you have labs (blood work) drawn today and your tests are completely normal, you will receive your results only by: Marland Kitchen MyChart Message (if you have MyChart) OR . A paper copy in the mail If you have any lab test that is abnormal or we need to change your treatment, we will call you to review the results.   Testing/Procedures: None    Follow-Up: AS NEEDED with Dr. Elease Hashimoto  You have been referred to Dr. Graciela Husbands for POTS evaluation. Appointment has been made for 10/23/20 at 3:45 PM.    Other Instructions Use liquid IV  Or V-8 juice to replace your electrolytes.

## 2020-10-05 ENCOUNTER — Other Ambulatory Visit: Payer: Self-pay | Admitting: Internal Medicine

## 2020-10-06 ENCOUNTER — Other Ambulatory Visit: Payer: Self-pay | Admitting: Adult Health

## 2020-10-06 DIAGNOSIS — F419 Anxiety disorder, unspecified: Secondary | ICD-10-CM

## 2020-10-17 ENCOUNTER — Telehealth: Payer: Self-pay | Admitting: Family Medicine

## 2020-10-17 ENCOUNTER — Ambulatory Visit: Payer: BC Managed Care – PPO | Admitting: Family Medicine

## 2020-10-17 ENCOUNTER — Encounter: Payer: Self-pay | Admitting: Family Medicine

## 2020-10-17 ENCOUNTER — Other Ambulatory Visit: Payer: Self-pay

## 2020-10-17 VITALS — BP 128/84 | HR 76 | Ht 70.0 in | Wt 256.0 lb

## 2020-10-17 DIAGNOSIS — Z9989 Dependence on other enabling machines and devices: Secondary | ICD-10-CM | POA: Diagnosis not present

## 2020-10-17 DIAGNOSIS — R42 Dizziness and giddiness: Secondary | ICD-10-CM | POA: Diagnosis not present

## 2020-10-17 DIAGNOSIS — G43109 Migraine with aura, not intractable, without status migrainosus: Secondary | ICD-10-CM

## 2020-10-17 DIAGNOSIS — G4733 Obstructive sleep apnea (adult) (pediatric): Secondary | ICD-10-CM | POA: Diagnosis not present

## 2020-10-17 NOTE — Progress Notes (Signed)
Chief Complaint  Patient presents with  . Follow-up    new rm  . Migraine  . Dizziness    pt here for a f/u on dizziness and balance.     HISTORY OF PRESENT ILLNESS: Today 10/18/20  Shane Saunders is a 49 y.o. male here today for follow up for headaches, dizziness and balance. We increased Amovig to 140mg  at last follow up. He does feel that it helped with intensity but he continues to have daily headaches. He has not taken sumatriptan. He continues to have dizziness every day. He reports cardiology workup was normal. PCP recommended tilt test but he reports cardiology told him it wasn't needed. Per cardiology's last note, he was referred for tilt testing. He participated in PT and felt that it helped but has not continued exercise at home. ENT cleared him.  He is very frustrated as no one has been able to provide a clear diagnosis.  Dizziness does seem to be worse with movement of his head from side to side or up and down.  PT/vestibular exercises have helped.  He continues to use CPAP consistently. He is not exercising. He does not monitor diet. He is unsure how much water he drinks. He admits that he is anxious but does not feel this is contributing.    HISTORY (copied from my note on 06/04/2020)  PARAS Shane Saunders is a 49 y.o. male here today for follow up OSA on CPAP therapy as well as chronic headaches.  He was previously on CPAP therapy, however, new sleep study was ordered and CPAP machine replaced due to age.  He states that he is doing well with CPAP usage.  He is using CPAP nightly.  He denies any difficulty with his machine or supplies.  He has been using CPAP for years and does feel that he sleeps well on therapy.  Headaches continue.  He describes of chronic, daily pressure sensation in various locations of the head.  Sometimes headache is associated with light sensitivity.  Headaches are worse with activity.  He denies sound sensitivity or nausea.  He does have intermittent  vertiginous symptoms.  He is participating in vestibular rehab and feels that this is helpful.  He continues close follow-up with his primary care provider for management of comorbidities.  He does have chronic sinusitis, depression anxiety that could into headaches.  He drinks at least 50 ounces of water daily.  He has started working on healthier eating habits.  He does note blurry vision, specifically after looking at the TV or a phone for an extended period of time.  He feels that it takes a little bit longer for his eyes to focus.  He was diagnosed with an astigmatism and given glasses to wear.  He has not worn glasses consistently.  He was started on Aimovig injections 70 mg every month.  He has taken 2 injections since last being seen in April.  He has not noted any significant improvement in headache intensity or frequency.  He has also tried rizatriptan for abortive therapy.  He does not feel that this has been very beneficial.  He uses over-the-counter analgesics sparingly.  He does continue gabapentin for chronic pain and headache management.  He is on metoprolol and verapamil for blood pressure management.  Blood pressures are usually within normal range.  Compliance report dated 05/05/2020 through 06/03/2020 reveals that he used CPAP 28 of the last 30 days for compliance of 93%.  He used CPAP greater  than 4 hours 27 of the past 30 days for compliance of 90%.  Average usage was 8 hours 19 minutes.  Residual AHI was 2.4 on 6 to 19 cm of water and an EPR of 3.  There was no significant leak noted.  HISTORY: (copied from Dr Richrd Humbles note on 04/17/2020)  UPDATE (04/17/20, VRP): Since last visit,more headaches, dizzines, gait difficulty.Symptoms areprogressive. Throbbing HA, sens to light, dizzy, scalp sens. General gait and balance issues.No alleviating or aggravating factors. Toleratingmeds.Gabapentin not helping.  PRIOR HPI (02/06/20):49 year old male here for evaluation of dizziness and  gait difficulty.  2 years ago patient had onset of dizziness and intermittent right lip swelling. His dizziness is described as feeling off balance, lightheadedness, brain fog and fatigue. He denies any spinning or vertigo sensations. However he did follow-up with ENT, apparently had some vestibular testing showing some dysfunction. He underwent vestibular therapy exercises without relief.  Also was having intermittent right upper lip swelling attacks lasting a few hours at a time and now more consistent. He went to ENT and dermatology for evaluation without specific diagnosis. Allergy immunology consult was considered but has not been pursued yet. He had variety of testing for autoimmune inflammatory etiologies which have been unremarkable.  Patient also has history of depression, on medication in the past. These medications have been weaned off to see if this would help improve symptoms but unfortunately symptoms have continued.  Patient has history of sleep apnea on CPAP, with testing from 12 years ago. He has a sleep consult pending.  Patient has some low back pain issues but denies any numbness or tingling in his feet or legs.  History (copied from Dr Dohmeier's note on 02/13/2020)  Shane Bob Swoffordis a49 y.o.Caucasian malepatientseen hereuponreferralon2/15/2021from Dr Edman Circle. Chiefconcernaccording to patient :' I am frustrated , 2 years of dizziness - and my CPAP needs to be replaced"  I have the pleasure of seeingJason J Swoffordtoday,aright-handedWhite or Caucasianmalewith OSAsleep disorder. She has ahas a past medical history of Abnormal glucose, Borderline hypertension, BPH (benign prostatic hypertrophy), Crushing injury of left elbow and forearm (07/13/2015), Depression, major, in partial remission (HCC) (07/12/2015), Dizziness, Hyperlipidemia, Hypertension, Hypogonadism male, Malrotation of colon, Morbid obesity (HCC), Obesity (BMI 30-39.9), OSA  on CPAP, Prediabetes, Right ureteral stone, Sleep apnea, Vertigo, Vitamin D deficiency, and Wears glasses.  2 years ago patient had onset of dizziness and intermittent right lip swelling. His dizziness is described as feeling off balance, lightheadedness, brain fog and fatigue. He denies any spinning or vertigo sensations. However he did follow-up with ENT, apparently had some vestibular testing showing some dysfunction. He underwent vestibular therapy exercises without relief. Dr Marjory Lies order imaging studies. Also was having intermittent right upper lip swelling attacks lasting a few hours at a time and now more consistent. He went to ENT and dermatology for evaluation without specific diagnosis. Allergy immunology consult was considered but has not been pursued yet. He had variety of testing for autoimmune inflammatory etiologies which have been unremarkable. Patient also has history of depression, on medication in the past. These medications have been weaned off to see if this would help improve symptoms but unfortunately symptoms have continued. Patient has some low back pain issues but denies any numbness or tingling in his feet or legs.  He has been referred for a new CPAP- his wife has recorded him stopping to breath for 27 seconds a long time, snores loudly- just this last weekend his house lost power and his sleep was miserable. The patient had  the first sleep study in the year 2007 at [facility] a result ofsevere OSA and loud snoring. His compliance data for his S9 Elite ResMed CPAP showed 97% of days and 93% 4 hours nightly. His average user time is 8 hours and 2 minutes which is very good. His AHI residual apnea and hypopnea index was 3.3/h of these obstructive apneas very few centrals. He does have an air leak at 17.7 L/min so his mask may not be the best fitting for him. The serial number of the current machine is [id number]. Even while using CPAP he endorsed  the Epworth sleepiness score at 19 out of 24 points which is a very high scale. His fatigue severity scale is equally high at 59 out of 63. He has frequent nasal CONGESTION, SINUSITIS, COUGHIN and PHLEGM- Familymedical /sleep history:Fatherwith OSA.  Social history:Patient ismarried for 17 years, 2 children,working asa Psychologist, occupational- and doesn't use repo iratory protection, only eye protection.and lives in a household with 4persons.  The patient currentlydoesn't work- was lad off after 31 years - Pets arenotpresent . Tobacco use; quit 2010. ETOH use; rarely, Caffeine intake in form of Coffee(none) Soda(none) Tea (with lunch or dinner outside-)and noenergy drinks. Regular exercise: weight / swimming - not now- lost weight 253 .Marland Kitchen    Sleep habits are as follows:The patient's dinner time is between5.30PM but he snacks-The patient goes to bed at12.30 AM and continues to sleep for3hours, but he uses 0.5 mg xanax,wakes rarelybathroom breaks. The preferred sleep position isprone, with the support of1pillow.  Dreams are reportedly rare.  8AM is the usual rise time. The patient wakes up with his wife, who wakes him at 7 AM. He reports not feeling refreshed or restored in AM, with symptoms such as dry mouth, morning headacheswhen he had no CPAP available, and residual fatigue.  Naps are taken infrequently.     REVIEW OF SYSTEMS: Out of a complete 14 system review of symptoms, the patient complains only of the following symptoms, dizziness, anxiety, depression, fatigue and all other reviewed systems are negative.   ALLERGIES: Allergies  Allergen Reactions  . Codeine Other (See Comments)    "I cannot sleep"  . Effexor [Venlafaxine] Other (See Comments)    "E.D."  . Prednisone Other (See Comments)    "I cannot sleep"  . Tramadol Hcl Itching  . Zinc Nausea Only and Other (See Comments)    "Upsets my stomach"     HOME MEDICATIONS: Outpatient Medications  Prior to Visit  Medication Sig Dispense Refill  . ALPRAZolam (XANAX) 1 MG tablet Take 1/2 - 1 tablet 2 - 3 x /day ONLY if needed for Panic Attack or  Anxiety Attack &  limit to 5 days /week to avoid Addiction & Dementia 60 tablet 0  . atorvastatin (LIPITOR) 80 MG tablet TAKE 1 TABLET BY MOUTH EVERY DAY 90 tablet 1  . buPROPion (WELLBUTRIN XL) 150 MG 24 hr tablet Take 150 mg by mouth every morning.    . Cholecalciferol (VITAMIN D3) 250 MCG (10000 UT) TABS Take 10,000 Units by mouth daily.    . Cyanocobalamin (VITAMIN B 12 PO) Take 1 tablet by mouth daily.    Marland Kitchen doxycycline (VIBRAMYCIN) 100 MG capsule Take 1 capsule    2 x /day     with Food for Skin Infection 180 capsule 0  . Erenumab-aooe 140 MG/ML SOAJ Inject 140 mg into the skin every 30 (thirty) days. 3 pen 3  . escitalopram (LEXAPRO) 20 MG tablet Take 1 tablet Daily  for Mood 90 tablet 3  . fexofenadine (ALLEGRA) 60 MG tablet Takes 1 tablet 2 x /day as needed for Allergies    . tamsulosin (FLOMAX) 0.4 MG CAPS capsule Take 1 capsule (0.4 mg total) by mouth daily after supper. 30 capsule 3  . valACYclovir (VALTREX) 1000 MG tablet Take 1 tablet by mouth 2 (two) times daily.    . cetirizine-pseudoephedrine (ZYRTEC-D) 5-120 MG tablet Takes 1 tablet 2 x/day as needed - congestion     No facility-administered medications prior to visit.     PAST MEDICAL HISTORY: Past Medical History:  Diagnosis Date  . Abnormal glucose   . Borderline hypertension   . BPH (benign prostatic hypertrophy)   . Crushing injury of left elbow and forearm 07/13/2015  . 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 Dizziness   . Hyperlipidemia   . Hypertension   . Hypogonadism male   . 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 Morbid obesity (HCC)   . Obesity (BMI 30-39.9)   . OSA on CPAP   . Prediabetes   . Right ureteral stone   . Sleep apnea    C PAP  . Vertigo   . Vitamin D deficiency   . Wears glasses      PAST  SURGICAL HISTORY: Past Surgical History:  Procedure Laterality Date  . 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 ARTERY REPAIR Right 05/31/2014   Procedure: IRRIGATION, EXPLORATION, AND REPAIR OF RIGHT ARM WOUND;  Surgeon: Cherylynn Ridges, MD;  Location: [facility] OR;  Service: General;  Laterality: Right;  . CYSTOSCOPY WITH RETROGRADE PYELOGRAM, URETEROSCOPY AND STENT PLACEMENT Right 07/11/2016   Procedure: CYSTOSCOPY WITH RETROGRADE PYELOGRAM, URETEROSCOPY, BASKET STONE EXTRACTION  AND STENT PLACEMENT;  Surgeon: Sebastian Ache, MD;  Location: [facility];  Service: Urology;  Laterality: Right;  45 MINS  C-ARM DIGITAL URETEROSCOPE HOLMIUM LASER  . ESOPHAGOGASTRODUODENOSCOPY       FAMILY HISTORY: Family History  Problem Relation Age of Onset  . Hypertension Mother   . Migraines Mother   . Thyroid disease Mother   . Hypertension Father   . Hyperlipidemia Father   . Diabetes Father   . Cancer Sister        thyroid  . CAD Paternal Grandfather      SOCIAL HISTORY: Social History   Socioeconomic History  . Marital status: Married    Spouse name: Angelica Chessman  . Number of children: 2  . Years of education: 58  . Highest education level: Not on file  Occupational History    Comment: na  Tobacco Use  . Smoking status: Former Smoker    Packs/day: 0.50    Years: 10.00    Pack years: 5.00    Types: Cigarettes    Quit date: 10/27/2009    Years since quitting: 10.9  . Smokeless tobacco: Former Neurosurgeon    Types: Snuff  Vaping Use  . Vaping Use: Never used  Substance and Sexual Activity  . Alcohol use: Yes    Comment: occasional  . Drug use: No  . Sexual activity: Yes    Comment: Vasectomy  Other Topics Concern  . Not on file  Social History Narrative   Lives with wife   Caffeine -tea, 1 daily       Social Determinants of Health   Financial Resource Strain:   . Difficulty of  Paying Living  Expenses: Not on file  Food Insecurity:   . Worried About Programme researcher, broadcasting/film/videounning Out of Food in the Last Year: Not on file  . Ran Out of Food in the Last Year: Not on file  Transportation Needs:   . Lack of Transportation (Medical): Not on file  . Lack of Transportation (Non-Medical): Not on file  Physical Activity:   . Days of Exercise per Week: Not on file  . Minutes of Exercise per Session: Not on file  Stress:   . Feeling of Stress : Not on file  Social Connections:   . Frequency of Communication with Friends and Family: Not on file  . Frequency of Social Gatherings with Friends and Family: Not on file  . Attends Religious Services: Not on file  . Active Member of Clubs or Organizations: Not on file  . Attends BankerClub or Organization Meetings: Not on file  . Marital Status: Not on file  Intimate Partner Violence:   . Fear of Current or Ex-Partner: Not on file  . Emotionally Abused: Not on file  . Physically Abused: Not on file  . Sexually Abused: Not on file      PHYSICAL EXAM  Vitals:   10/17/20 1450  BP: 128/84  Pulse: 76  Weight: 256 lb (116.1 kg)  Height: 5\' 10"  (1.778 m)   Body mass index is 36.73 kg/m.   Generalized: Well developed, in no acute distress   Neurological examination  Mentation: Alert oriented to time, place, history taking. Follows all commands speech and language fluent Cranial nerve II-XII: Pupils were equal round reactive to light. Extraocular movements were full, visual field were full on confrontational test. No nystagmus. No dizziness with testing. Facial sensation and strength were normal. Head turning and shoulder shrug  were normal and symmetric. Motor: The motor testing reveals 5 over 5 strength of all 4 extremities. Good symmetric motor tone is noted throughout.  Sensory: Sensory testing is intact to soft touch on all 4 extremities. No evidence of extinction is noted.  Coordination: Cerebellar testing reveals good finger-nose-finger and heel-to-shin  bilaterally.  Gait and station: Gait is normal.  Reflexes: Deep tendon reflexes are symmetric and normal bilaterally.     DIAGNOSTIC DATA (LABS, IMAGING, TESTING) - I reviewed patient records, labs, notes, testing and imaging myself where available.  Lab Results  Component Value Date   WBC 5.0 08/20/2020   HGB 15.5 08/20/2020   HCT 43.6 08/20/2020   MCV 94.6 08/20/2020   PLT 88 (L) 08/20/2020      Component Value Date/Time   NA 139 08/20/2020 1101   NA 140 05/25/2018 0941   K 4.4 08/20/2020 1101   CL 103 08/20/2020 1101   CO2 28 08/20/2020 1101   GLUCOSE 88 08/20/2020 1101   BUN 11 08/20/2020 1101   BUN 10 05/25/2018 0941   CREATININE 1.26 08/20/2020 1101   CALCIUM 10.2 08/20/2020 1101   PROT 6.9 08/20/2020 1101   ALBUMIN 4.4 03/14/2020 1250   AST 22 08/20/2020 1101   AST 23 03/14/2020 1250   ALT 25 08/20/2020 1101   ALT 42 03/14/2020 1250   ALKPHOS 79 03/14/2020 1250   BILITOT 1.1 08/20/2020 1101   BILITOT 1.1 03/14/2020 1250   GFRNONAA 66 08/20/2020 1101   GFRAA 77 08/20/2020 1101   Lab Results  Component Value Date   CHOL 184 05/07/2020   HDL 34 (L) 05/07/2020   LDLCALC 116 (H) 05/07/2020   TRIG 217 (H) 05/07/2020   CHOLHDL 5.4 (  H) 05/07/2020   Lab Results  Component Value Date   HGBA1C 5.1 05/07/2020   Lab Results  Component Value Date   VITAMINB12 1,040 05/07/2020   Lab Results  Component Value Date   TSH 2.07 08/20/2020      ASSESSMENT AND PLAN  49 y.o. year old male  has a past medical history of Abnormal glucose, Borderline hypertension, BPH (benign prostatic hypertrophy), Crushing injury of left elbow and forearm (07/13/2015), Depression, major, in partial remission (HCC) (07/12/2015), Dizziness, Hyperlipidemia, Hypertension, Hypogonadism male, Malrotation of colon, Morbid obesity (HCC), Obesity (BMI 30-39.9), OSA on CPAP, Prediabetes, Right ureteral stone, Sleep apnea, Vertigo, Vitamin D deficiency, and Wears glasses. here with   Migraine  with aura and without status migrainosus, not intractable  OSA on CPAP  Dizzinesses   Patient remains frustrated as episodes of dizziness continue to occur without any obvious etiology.  He has had multiple work-ups with multiple specialties with no identifying cause.  CT head and neck CT angio have been normal.  Remote MRI normal.  PT therapy has been the only thing that helps.  He has not continue to perform exercises at home.  I have encouraged him to consider resuming physical therapy exercises.  I would be happy to refer him back to physical therapy if insurance will cover this for him.  He is very disengaged at today's visit.  He initially stated that there was no used to do the tilt test and he did not wish to pursue this option, however, at the end of the visit he states that he has an appointment tomorrow to have this procedure performed.  I have suggested that he keep that appointment.  Cardiology has also discussed concerns of orthostatic hypotension.  It is unclear if he is well-hydrated.  He admits that he does not focus on a healthy diet.  He is not exercising.  I have encouraged healthy lifestyle habits.  I encouraged him to continue using CPAP nightly and for greater than 4 hours each night.  He may continue Aimovig monthly for migraine prevention.  I have encouraged him to use his sumatriptan as needed for migraine abortive therapy.  I have offered to send him to neurology with an academic center for second opinion.  He reports that he has talked with ENT at [facility] and they told him not to come back.  He states that he is not interested in follow-up and does not wish to return.  He was encouraged to continue close follow-up with primary care.  I spent 40 minutes of face-to-face and non-face-to-face time with patient.  This included previsit chart review, lab review, study review, order entry, electronic health record documentation, patient education.    Shawnie Dapper, MSN, FNP-C 10/18/2020,  5:04 PM  [facility] [address], [address] [place], [place] [address] [phone]

## 2020-10-17 NOTE — Patient Instructions (Addendum)
Continue Amovig monthly. You may try sumatriptan if needed.   I will share this information with Dr Marjory Lies. If there is any further workup he recommends, I will be happy to get this scheduled. Per current workup, I am not able to identify any neurologic cause of dizziness (stroke, tumor, vascular abnormality, etc). I would recommend you consider second opinion with an academic center. I am happy to help you with that if you wish.   Consider tilt test with cardiology as previously recommended.   Stay well hydrated. Focus on healthy, well balance diet.   Follow up in 1 year, sooner if needed   Migraine Headache A migraine headache is a very strong throbbing pain on one side or both sides of your head. This type of headache can also cause other symptoms. It can last from 4 hours to 3 days. Talk with your doctor about what things may bring on (trigger) this condition. What are the causes? The exact cause of this condition is not known. This condition may be triggered or caused by:  Drinking alcohol.  Smoking.  Taking medicines, such as: ? Medicine used to treat chest pain (nitroglycerin). ? Birth control pills. ? Estrogen. ? Some blood pressure medicines.  Eating or drinking certain products.  Doing physical activity. Other things that may trigger a migraine headache include:  Having a menstrual period.  Pregnancy.  Hunger.  Stress.  Not getting enough sleep or getting too much sleep.  Weather changes.  Tiredness (fatigue). What increases the risk?  Being 19-52 years old.  Being male.  Having a family history of migraine headaches.  Being Caucasian.  Having depression or anxiety.  Being very overweight. What are the signs or symptoms?  A throbbing pain. This pain may: ? Happen in any area of the head, such as on one side or both sides. ? Make it hard to do daily activities. ? Get worse with physical activity. ? Get worse around bright lights or loud  noises.  Other symptoms may include: ? Feeling sick to your stomach (nauseous). ? Vomiting. ? Dizziness. ? Being sensitive to bright lights, loud noises, or smells.  Before you get a migraine headache, you may get warning signs (an aura). An aura may include: ? Seeing flashing lights or having blind spots. ? Seeing bright spots, halos, or zigzag lines. ? Having tunnel vision or blurred vision. ? Having numbness or a tingling feeling. ? Having trouble talking. ? Having weak muscles.  Some people have symptoms after a migraine headache (postdromal phase), such as: ? Tiredness. ? Trouble thinking (concentrating). How is this treated?  Taking medicines that: ? Relieve pain. ? Relieve the feeling of being sick to your stomach. ? Prevent migraine headaches.  Treatment may also include: ? Having acupuncture. ? Avoiding foods that bring on migraine headaches. ? Learning ways to control your body functions (biofeedback). ? Therapy to help you know and deal with negative thoughts (cognitive behavioral therapy). Follow these instructions at home: Medicines  Take over-the-counter and prescription medicines only as told by your doctor.  Ask your doctor if the medicine prescribed to you: ? Requires you to avoid driving or using heavy machinery. ? Can cause trouble pooping (constipation). You may need to take these steps to prevent or treat trouble pooping:  Drink enough fluid to keep your pee (urine) pale yellow.  Take over-the-counter or prescription medicines.  Eat foods that are high in fiber. These include beans, whole grains, and fresh fruits and vegetables.  Limit  foods that are high in fat and sugar. These include fried or sweet foods. Lifestyle  Do not drink alcohol.  Do not use any products that contain nicotine or tobacco, such as cigarettes, e-cigarettes, and chewing tobacco. If you need help quitting, ask your doctor.  Get at least 8 hours of sleep every  night.  Limit and deal with stress. General instructions      Keep a journal to find out what may bring on your migraine headaches. For example, write down: ? What you eat and drink. ? How much sleep you get. ? Any change in what you eat or drink. ? Any change in your medicines.  If you have a migraine headache: ? Avoid things that make your symptoms worse, such as bright lights. ? It may help to lie down in a dark, quiet room. ? Do not drive or use heavy machinery. ? Ask your doctor what activities are safe for you.  Keep all follow-up visits as told by your doctor. This is important. Contact a doctor if:  You get a migraine headache that is different or worse than others you have had.  You have more than 15 headache days in one month. Get help right away if:  Your migraine headache gets very bad.  Your migraine headache lasts longer than 72 hours.  You have a fever.  You have a stiff neck.  You have trouble seeing.  Your muscles feel weak or like you cannot control them.  You start to lose your balance a lot.  You start to have trouble walking.  You pass out (faint).  You have a seizure. Summary  A migraine headache is a very strong throbbing pain on one side or both sides of your head. These headaches can also cause other symptoms.  This condition may be treated with medicines and changes to your lifestyle.  Keep a journal to find out what may bring on your migraine headaches.  Contact a doctor if you get a migraine headache that is different or worse than others you have had.  Contact your doctor if you have more than 15 headache days in a month. This information is not intended to replace advice given to you by your health care provider. Make sure you discuss any questions you have with your health care provider. Document Revised: 04/08/2019 Document Reviewed: 01/27/2019 Elsevier Patient Education  2020 [employer].

## 2020-10-17 NOTE — Telephone Encounter (Signed)
FYI: During check out for 10/20 appointment, pt stated he was not interested in following up with [place] and asked me to cancel future appointments/not schedule anything else for him.

## 2020-10-17 NOTE — Telephone Encounter (Signed)
Thank you. Noted.

## 2020-10-18 ENCOUNTER — Encounter: Payer: Self-pay | Admitting: Family Medicine

## 2020-10-22 NOTE — Progress Notes (Signed)
I reviewed note and agree with plan.   Suanne Marker, MD 10/22/2020, 5:01 PM Certified in Neurology, Neurophysiology and Neuroimaging  [facility] [address], [address] [place], [place] [address] [phone]

## 2020-10-23 ENCOUNTER — Institutional Professional Consult (permissible substitution): Payer: BC Managed Care – PPO | Admitting: Internal Medicine

## 2020-10-23 DIAGNOSIS — I951 Orthostatic hypotension: Secondary | ICD-10-CM

## 2020-11-11 NOTE — Progress Notes (Signed)
History of Present Illness:     Shane Saunders is a very nice 49 yo MWM who presents with a very complicated hx/o Rt facial &lip swelling, facial paresthesias, unsteady gait predating from Nov 2020.  Since that time he has seen at least 10-11 providers from various specialities including  Dermatologists, ENT Doctors,Sleep Specialists,Cardiologists,Neurologists with numerous Normal studies including neck and head CTA and a Brain MRI, ENG's, EEG's, cardiac echo and vestibular testing that was normal. He's alsonegative labs forCBC, ESR, hsCRP, ANCA, total Compliment and C3 &C4, Anti-Smith, HLA-B 27 Ag, Angiotensin converting enzyme and C1 Esterase Inhibitor.  He has been evaluated by Dr Leta Baptist and referred for vestibular rehab. He describes his dizziness  as feeling off balance, lightheaded, brain fog and fatigue. He does report tinnitus. Today, he does report   spinning or vertigo sensations.      He has been told that he might have "Vestibular Migraine" and treated with monthly Aimovig injections w/o improvement. He describes frontal HA's. He is concerned that he might have "POTS" and is scheduled to see Dr Caryl Comes on Nov 30.    Medications  .  atorvastatin  80 MG , TAKE 1 TABLET BEVERY DAY .  fexofenadine  60 MG , Takes 1 tablet 2 x /day as needed for Allergies .  Erenumab (Aimovig) , Inject 140 mg  every 30 days. Marland Kitchen VITAMIN B 12 , Take 1 tablet daily. Marland Kitchen  ALPRAZolam  1 MG , Take 1/2 - 1 tablet - usually at hs .  buPROPion-X 150 MG 24 hr tablet, Take  every morning. Marland Kitchen  VITAMIN D 10,000 U, Take 10,000 Units daily. Marland Kitchen  doxycycline 100 MG , Take 1 capsule 2 x /day for Skin Infection .  LEXAPRO 20 MG , Take 1 tablet Daily f .  FLOMAX 0.4 MG , Take 1 capsule  daily after supper. Marland Kitchen VALTREX)1000 MG , Take 1 tablet  2  times daily.  Problem list He has Essential hypertension; Hyperlipidemia, mixed; OSA on CPAP; Abnormal glucose; Morbid obesity ([place]); Vitamin D deficiency; Medication management;  Depression, major, in partial remission ([place]); Crushing injury of left elbow and forearm; FH: hypertension; Prediabetes; Former smoker; Family history of ischemic heart disease; BPH with obstruction/lower urinary tract symptoms; Excessive daytime sleepiness; Chronic fatigue; Pseudothrombocytopenia; Obstructive sleep apnea hypopnea, severe; and Orthostatic hypotension on their problem list.   Observations/Objective:  BP SIT 108/76 & STAND 98/66  P 87   T 97.4 F    R 16   Ht 5' 10"    Wt 250 lb   SpO2 95%   BMI 35.87   Repeat postural Sit for 5-20 min    BP 121/64    P 72     & Stand for  1 min BP 137/81     P 83   - does not demonstrate a 30  Increase in HR , therefore making POTS unlikely   -nor does it demonstrate a 20 Decrease in Sys or 10 in [place] to qualify as orthostatic Hypotension.  HEENT - WNL. Neck - supple.  Chest - Clear equal BS. Cor - Nl HS. RRR w/o sig MGR. PP 1(+). No edema. MS- FROM w/o deformities.  Gait Nl. Neuro -  Nl w/o focal abnormalities.  Assessment and Plan:  1. Benign paroxysmal positional vertigo, unspecified laterality  -  Empiric trial with meclizine 25 MG tablet; Take 1/2 to 1 tablet 2 to 3 x /day as needed for Dizziness /Vertigo  Dispense: 90 tablet; Refill: 11 -  dexamethasone (DECADRON) 4 MG tablet; Take 1 tab 3 x day - 3 days, then 2 x day - 3 days, then 1 tab daily  Dispense: 20 tablet; Refill: 0  2. Labile hypertension  - CBC with Differential/Platelet - COMPLETE METABOLIC PANEL WITH GFR - TSH - EKG 12-Lead - Normal   3. Unstable gait  - CBC with Differential/Platelet - COMPLETE METABOLIC PANEL WITH GFR  4. Multiple neurological symptoms  - CBC with Differential/Platelet - COMPLETE METABOLIC PANEL WITH GFR - TSH  5. Tension headache   6. Medication management  - CBC with Differential/Platelet - COMPLETE METABOLIC PANEL WITH GFR - TSH  Follow Up Instructions:            Recc 3 week f/u to evaluate response to therapy         I discussed the assessment and treatment plan with the patient. The patient was provided an opportunity to ask questions and all were answered. The patient agreed with the plan and demonstrated an understanding of the instructions.        The patient was advised to call back or seek an in-person evaluation if the symptoms worsen or if the condition fails to improve as anticipated.   Kirtland Bouchard, MD

## 2020-11-12 ENCOUNTER — Ambulatory Visit (INDEPENDENT_AMBULATORY_CARE_PROVIDER_SITE_OTHER): Payer: BC Managed Care – PPO | Admitting: Internal Medicine

## 2020-11-12 ENCOUNTER — Other Ambulatory Visit: Payer: Self-pay

## 2020-11-12 ENCOUNTER — Encounter: Payer: Self-pay | Admitting: Internal Medicine

## 2020-11-12 VITALS — HR 87 | Temp 97.4°F | Resp 16 | Ht 70.0 in | Wt 250.0 lb

## 2020-11-12 DIAGNOSIS — R2681 Unsteadiness on feet: Secondary | ICD-10-CM | POA: Diagnosis not present

## 2020-11-12 DIAGNOSIS — R0989 Other specified symptoms and signs involving the circulatory and respiratory systems: Secondary | ICD-10-CM | POA: Diagnosis not present

## 2020-11-12 DIAGNOSIS — Z79899 Other long term (current) drug therapy: Secondary | ICD-10-CM | POA: Diagnosis not present

## 2020-11-12 DIAGNOSIS — R2990 Unspecified symptoms and signs involving the nervous system: Secondary | ICD-10-CM | POA: Diagnosis not present

## 2020-11-12 DIAGNOSIS — G44209 Tension-type headache, unspecified, not intractable: Secondary | ICD-10-CM

## 2020-11-12 DIAGNOSIS — H8110 Benign paroxysmal vertigo, unspecified ear: Secondary | ICD-10-CM

## 2020-11-12 MED ORDER — DEXAMETHASONE 4 MG PO TABS: ORAL_TABLET | ORAL | 0 refills | Status: DC

## 2020-11-12 MED ORDER — MECLIZINE HCL 25 MG PO TABS: ORAL_TABLET | ORAL | 11 refills | Status: DC

## 2020-11-12 NOTE — Patient Instructions (Signed)
Benign Positional Vertigo Vertigo is the feeling that you or your surroundings are moving when they are not. Benign positional vertigo is the most common form of vertigo. This is usually a harmless condition (benign). This condition is positional. This means that symptoms are triggered by certain movements and positions. This condition can be dangerous if it occurs while you are doing something that could cause harm to you or others. This includes activities such as driving or operating machinery. What are the causes? In many cases, the cause of this condition is not known. It may be caused by a disturbance in an area of the inner ear that helps your brain to sense movement and balance. This disturbance can be caused by:  Viral infection (labyrinthitis).  Head injury.  Repetitive motion, such as jumping, dancing, or running. What increases the risk? You are more likely to develop this condition if:  You are a woman.  You are 50 years of age or older. What are the signs or symptoms? Symptoms of this condition usually happen when you move your head or your eyes in different directions. Symptoms may start suddenly, and usually last for less than a minute. They include:  Loss of balance and falling.  Feeling like you are spinning or moving.  Feeling like your surroundings are spinning or moving.  Nausea and vomiting.  Blurred vision.  Dizziness.  Involuntary eye movement (nystagmus). Symptoms can be mild and cause only minor problems, or they can be severe and interfere with daily life. Episodes of benign positional vertigo may return (recur) over time. Symptoms may improve over time. How is this diagnosed? This condition may be diagnosed based on:  Your medical history.  Physical exam of the head, neck, and ears.  Tests, such as: ? MRI. ? CT scan. ? Eye movement tests. Your health care provider may ask you to change positions quickly while he or she watches you for symptoms  of benign positional vertigo, such as nystagmus. Eye movement may be tested with a variety of exams that are designed to evaluate or stimulate vertigo. ? An electroencephalogram (EEG). This records electrical activity in your brain. ? Hearing tests. You may be referred to a health care provider who specializes in ear, nose, and throat (ENT) problems (otolaryngologist) or a provider who specializes in disorders of the nervous system (neurologist). How is this treated?  This condition may be treated in a session in which your health care provider moves your head in specific positions to adjust your inner ear back to normal. Treatment for this condition may take several sessions. Surgery may be needed in severe cases, but this is rare. In some cases, benign positional vertigo may resolve on its own in 2-4 weeks. Follow these instructions at home: Safety  Move slowly. Avoid sudden body or head movements or certain positions, as told by your health care provider.  Avoid driving until your health care provider says it is safe for you to do so.  Avoid operating heavy machinery until your health care provider says it is safe for you to do so.  Avoid doing any tasks that would be dangerous to you or others if vertigo occurs.  If you have trouble walking or keeping your balance, try using a cane for stability. If you feel dizzy or unstable, sit down right away.  Return to your normal activities as told by your health care provider. Ask your health care provider what activities are safe for you. General instructions  Take over-the-counter   and prescription medicines only as told by your health care provider.  Drink enough fluid to keep your urine pale yellow.  Keep all follow-up visits as told by your health care provider. This is important. Contact a health care provider if:  You have a fever.  Your condition gets worse or you develop new symptoms.  Your family or friends notice any  behavioral changes.  You have nausea or vomiting that gets worse.  You have numbness or a "pins and needles" sensation. Get help right away if you:  Have difficulty speaking or moving.  Are always dizzy.  Faint.  Develop severe headaches.  Have weakness in your legs or arms.  Have changes in your hearing or vision.  Develop a stiff neck.  Develop sensitivity to light. Summary  Vertigo is the feeling that you or your surroundings are moving when they are not. Benign positional vertigo is the most common form of vertigo.  The cause of this condition is not known. It may be caused by a disturbance in an area of the inner ear that helps your brain to sense movement and balance.  Symptoms include loss of balance and falling, feeling that you or your surroundings are moving, nausea and vomiting, and blurred vision.  This condition can be diagnosed based on symptoms, physical exam, and other tests, such as MRI, CT scan, eye movement tests, and hearing tests.  Follow safety instructions as told by your health care provider. You will also be told when to contact your health care provider in case of problems. This information is not intended to replace advice given to you by your health care provider. Make sure you discuss any questions you have with your health care provider. Document Revised: 05/26/2018 Document Reviewed: 05/26/2018 Elsevier Patient Education  2020 [employer] Inc[employer]  

## 2020-11-13 LAB — CBC WITH DIFFERENTIAL/PLATELET
Absolute Monocytes: 866 cells/uL (ref 200–950)
Basophils Absolute: 81 cells/uL (ref 0–200)
Basophils Relative: 1.1 %
Eosinophils Absolute: 466 cells/uL (ref 15–500)
Eosinophils Relative: 6.3 %
HCT: 45.6 % (ref 38.5–50.0)
Hemoglobin: 15.9 g/dL (ref 13.2–17.1)
Lymphs Abs: 1857 cells/uL (ref 850–3900)
MCH: 33.8 pg — ABNORMAL HIGH (ref 27.0–33.0)
MCHC: 34.9 g/dL (ref 32.0–36.0)
MCV: 97 fL (ref 80.0–100.0)
MPV: 10.6 fL (ref 7.5–12.5)
Monocytes Relative: 11.7 %
Neutro Abs: 4129 cells/uL (ref 1500–7800)
Neutrophils Relative %: 55.8 %
Platelets: 65 10*3/uL — ABNORMAL LOW (ref 140–400)
RBC: 4.7 10*6/uL (ref 4.20–5.80)
RDW: 11.7 % (ref 11.0–15.0)
Total Lymphocyte: 25.1 %
WBC: 7.4 10*3/uL (ref 3.8–10.8)

## 2020-11-13 LAB — COMPLETE METABOLIC PANEL WITH GFR
AG Ratio: 2.4 (calc) (ref 1.0–2.5)
ALT: 25 U/L (ref 9–46)
AST: 20 U/L (ref 10–35)
Albumin: 4.5 g/dL (ref 3.6–5.1)
Alkaline phosphatase (APISO): 70 U/L (ref 35–144)
BUN: 13 mg/dL (ref 7–25)
CO2: 29 mmol/L (ref 20–32)
Calcium: 9.6 mg/dL (ref 8.6–10.3)
Chloride: 106 mmol/L (ref 98–110)
Creat: 1.05 mg/dL (ref 0.70–1.33)
GFR, Est African American: 95 mL/min/{1.73_m2} (ref >=60)
GFR, Est Non African American: 82 mL/min/{1.73_m2} (ref >=60)
Globulin: 1.9 g/dL (calc) (ref 1.9–3.7)
Glucose, Bld: 109 mg/dL — ABNORMAL HIGH (ref 65–99)
Potassium: 4 mmol/L (ref 3.5–5.3)
Sodium: 141 mmol/L (ref 135–146)
Total Bilirubin: 0.9 mg/dL (ref 0.2–1.2)
Total Protein: 6.4 g/dL (ref 6.1–8.1)

## 2020-11-13 LAB — TSH: TSH: 2.84 mIU/L (ref 0.40–4.50)

## 2020-11-13 NOTE — Progress Notes (Signed)
========================================================== -   Test results slightly outside the reference range are not unusual. If there is anything important, I will review this with you,  otherwise it is considered normal test values.  If you have further questions,  please do not hesitate to contact me at the office or via My Chart.  ==========================================================  -  CBC show Normal Red cell count & WBC - No sign of Anemia or Infection   ==========================================================  -  All Else  Kidneys - Electrolytes - Liver - Magnesium & Thyroid    - all  Normal / OK - No signs of Dehydration ==========================================================

## 2020-11-20 ENCOUNTER — Ambulatory Visit: Payer: BC Managed Care – PPO | Admitting: Internal Medicine

## 2020-11-24 ENCOUNTER — Other Ambulatory Visit: Payer: Self-pay | Admitting: Internal Medicine

## 2020-11-24 DIAGNOSIS — F419 Anxiety disorder, unspecified: Secondary | ICD-10-CM

## 2020-11-27 ENCOUNTER — Ambulatory Visit: Payer: BC Managed Care – PPO | Admitting: Internal Medicine

## 2020-11-27 ENCOUNTER — Encounter: Payer: Self-pay | Admitting: Internal Medicine

## 2020-11-27 ENCOUNTER — Other Ambulatory Visit: Payer: Self-pay

## 2020-11-27 VITALS — BP 125/76 | HR 80 | Ht 70.0 in | Wt 251.8 lb

## 2020-11-27 DIAGNOSIS — G90A Postural orthostatic tachycardia syndrome (POTS): Secondary | ICD-10-CM

## 2020-11-27 DIAGNOSIS — I498 Other specified cardiac arrhythmias: Secondary | ICD-10-CM

## 2020-11-27 NOTE — Patient Instructions (Addendum)
Medication Instructions:  ?Your physician recommends that you continue on your current medications as directed. Please refer to the Current Medication list given to you today. ? ?*If you need a refill on your cardiac medications before your next appointment, please call your pharmacy* ? ? ?Lab Work: ?None ordered. ? ?If you have labs (blood work) drawn today and your tests are completely normal, you will receive your results only by: ?MyChart Message (if you have MyChart) OR ?A paper copy in the mail ?If you have any lab test that is abnormal or we need to change your treatment, we will call you to review the results. ? ? ?Testing/Procedures: ?None ordered. ? ? ? ?Follow-Up: ?At [facility], you and your health needs are our priority.  As part of our continuing mission to provide you with exceptional heart care, we have created designated Provider Care Teams.  These Care Teams include your primary Cardiologist (physician) and Advanced Practice Providers (APPs -  Physician Assistants and Nurse Practitioners) who all work together to provide you with the care you need, when you need it. ? ?We recommend signing up for the patient portal called "MyChart".  Sign up information is provided on this After Visit Summary.  MyChart is used to connect with patients for Virtual Visits (Telemedicine).  Patients are able to view lab/test results, encounter notes, upcoming appointments, etc.  Non-urgent messages can be sent to your provider as well.   ?To learn more about what you can do with MyChart, go to [email or web address].   ? ?Your next appointment:   ?Follow up as needed with Dr Klein ?

## 2020-11-27 NOTE — Progress Notes (Signed)
ELECTROPHYSIOLOGY CONSULT NOTE  Patient ID: Shane Saunders, MRN: [id number], DOB/AGE: 50-May-1972 50 y.o. Admit date: (Not on file) Date of Consult: 11/27/2020  Primary Physician: Unk Pinto, MD Primary Cardiologist: Lurena Nida     Shane Saunders is a 49 y.o. male who is being seen today for the evaluation of orthostatic lightheadedness  at the request of Drs Acie Fredrickson and Pneumalli.    HPI Shane Saunders is a 49 y.o. male 3+ year history of a progressively disabling symptom complex characterized by giddiness and dizziness, gait instability with drifting to right and/or left, loss of postural tone transiently, aggravation with head motion.  It began with problems with intermittent lip swelling.  Symptoms have been worsening over recent months and that they have been increasingly frequent and increasingly severe.  His "syncope "involves loss of postural tone but a sense that he is awake before he hits the ground.  He has been described to these events as flushed and not pale.  Also history of palpitations for which he underwent event recording 2019.  Symptoms were associated with sinus rhythm both normal and with some tachycardia  Heavy metal screens MMA were negative; inflammatory markers including sed rate, CRP, C3 and C4, HLA-B27, HLA-B27 And B burgdorferi antibodies were all negative  Concurrent history of migraine headaches DATE TEST EF   4/21 [facility]   51 % Nl perfusion imaging  4/21 Echo   55 %         Date Cr K TSH Hgb  11/21 1.05 4.0 2.84 15.9             Past Medical History:  Diagnosis Date   Abnormal glucose    Borderline hypertension    BPH (benign prostatic hypertrophy)    Crushing injury of left elbow and forearm 07/13/2015   Depression, major, in partial remission ([place]) 07/12/2015   Dizziness    Hyperlipidemia    Hypertension    Hypogonadism male    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   Morbid obesity (HCC)     Obesity (BMI 30-39.9)    OSA on CPAP    Prediabetes    Right ureteral stone    Sleep apnea    C PAP   Vertigo    Vitamin D deficiency    Wears glasses       Surgical History:  Past Surgical History:  Procedure Laterality Date   ABDOMINAL EXPLORATION SURGERY  1998   Celiotomy and Appendectomy /  (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, congenital)   ARTERY REPAIR Right 05/31/2014   Procedure: IRRIGATION, EXPLORATION, AND REPAIR OF RIGHT ARM WOUND;  Surgeon: Gwenyth Ober, MD;  Location: [facility];  Service: General;  Laterality: Right;   CYSTOSCOPY WITH RETROGRADE PYELOGRAM, URETEROSCOPY AND STENT PLACEMENT Right 07/11/2016   Procedure: CYSTOSCOPY WITH RETROGRADE PYELOGRAM, URETEROSCOPY, BASKET STONE EXTRACTION  AND STENT PLACEMENT;  Surgeon: Alexis Frock, MD;  Location: [facility];  Service: Urology;  Laterality: Right;  45 MINS  C-ARM DIGITAL URETEROSCOPE HOLMIUM LASER   ESOPHAGOGASTRODUODENOSCOPY       Home Meds: Current Meds  Medication Sig   ALPRAZolam (XANAX) 1 MG tablet Take      1/2 to 1 tablet      2 to 3 x /day   ONLY      If needed for Panic Attack  & try limit use to Avoid Addiction & Dementia   atorvastatin (LIPITOR) 80 MG  tablet TAKE 1 TABLET BY MOUTH EVERY DAY   buPROPion (WELLBUTRIN XL) 300 MG 24 hr tablet Take 300 mg by mouth daily.   Cholecalciferol (VITAMIN D3) 250 MCG (10000 UT) TABS Take 10,000 Units by mouth daily.   Cyanocobalamin (VITAMIN B 12 PO) Take 1 tablet by mouth daily.   dexamethasone (DECADRON) 4 MG tablet Take 1 tab 3 x day - 3 days, then 2 x day - 3 days, then 1 tab daily   escitalopram (LEXAPRO) 20 MG tablet Take 1 tablet Daily for Mood   fexofenadine (ALLEGRA) 60 MG tablet Takes 1 tablet 2 x /day as needed for Allergies   meclizine (ANTIVERT) 25 MG tablet Take 1/2 to 1 tablet 2 to 3 x /day as needed for Dizziness / Vertigo   tamsulosin (FLOMAX) 0.4 MG CAPS capsule Take 1 capsule (0.4 mg total) by mouth daily after  supper.    Allergies:  Allergies  Allergen Reactions   Codeine Other (See Comments)    "I cannot sleep"   Effexor [Venlafaxine] Other (See Comments)    "E.D."   Prednisone Other (See Comments)    "I cannot sleep"   Tramadol Hcl Itching   Zinc Nausea Only and Other (See Comments)    "Upsets my stomach"    Social History   Socioeconomic History   Marital status: Married    Spouse name: [name]   Number of children: 2   Years of education: 12   Highest education level: Not on file  Occupational History    Comment: na  Tobacco Use   Smoking status: Former Smoker    Packs/day: 0.50    Years: 10.00    Pack years: 5.00    Types: Cigarettes    Quit date: 10/27/2009    Years since quitting: 11.0   Smokeless tobacco: Former Systems developer    Types: Snuff  Vaping Use   Vaping Use: Never used  Substance and Sexual Activity   Alcohol use: Yes    Comment: occasional   Drug use: No   Sexual activity: Yes    Comment: Vasectomy  Other Topics Concern   Not on file  Social History Narrative   Lives with wife   Caffeine -tea, 1 daily       Social Determinants of Health   Financial Resource Strain:    Difficulty of Paying Living Expenses: Not on file  Food Insecurity:    Worried About Charity fundraiser in the Last Year: Not on file   [employer] of Food in the Last Year: Not on file  Transportation Needs:    Lack of Transportation (Medical): Not on file   Lack of Transportation (Non-Medical): Not on file  Physical Activity:    Days of Exercise per Week: Not on file   Minutes of Exercise per Session: Not on file  Stress:    Feeling of Stress : Not on file  Social Connections:    Frequency of Communication with Friends and Family: Not on file   Frequency of Social Gatherings with Friends and Family: Not on file   Attends Religious Services: Not on file   Active Member of Clubs or Organizations: Not on file   Attends Archivist Meetings: Not on  file   Marital Status: Not on file  Intimate Partner Violence:    Fear of Current or Ex-Partner: Not on file   Emotionally Abused: Not on file   Physically Abused: Not on file   Sexually Abused: Not on file  Family History  Problem Relation Age of Onset   Hypertension Mother    Migraines Mother    Thyroid disease Mother    Hypertension Father    Hyperlipidemia Father    Diabetes Father    Cancer Sister        thyroid   CAD Paternal Grandfather      ROS:  Please see the history of present illness.     All other systems reviewed and negative.    Physical Exam:  Blood pressure 125/76, pulse 80, height $RemoveBe'5\' 10"'[id number]$  (1.778 m), weight 251 lb 12.8 oz (114.2 kg), SpO2 96 %. General: Well developed, well nourished male in no acute distress. Head: Normocephalic, atraumatic, sclera non-icteric, no xanthomas, nares are without discharge. EENT: normal  Lymph Nodes:  none Neck: Negative for carotid bruits. JVD not elevated. Back:without scoliosis kyphosis  Lungs: Clear bilaterally to auscultation without wheezes, rales, or rhonchi. Breathing is unlabored. Heart: RRR with S1 S2. No   murmur . No rubs, or gallops appreciated. Abdomen: Soft, non-tender, non-distended with normoactive bowel sounds. No hepatomegaly. No rebound/guarding. No obvious abdominal masses. Msk:  Strength and tone appear normal for age. Extremities: No clubbing or cyanosis. No  edema.  Distal pedal pulses are 2+ and equal bilaterally. Skin: Warm and Dry Neuro: Alert and oriented X 3. CN III-XII intact Grossly normal sensory and motor function . Psych:  Responds to questions appropriately with a flat affect.      Labs: Cardiac Enzymes No results for input(s): CKTOTAL, CKMB, TROPONINI in the last 72 hours. CBC Lab Results  Component Value Date   WBC 7.4 11/12/2020   HGB 15.9 11/12/2020   HCT 45.6 11/12/2020   MCV 97.0 11/12/2020   PLT 65 (L) 11/12/2020   PROTIME: No results for input(s): LABPROT,  INR in the last 72 hours. Chemistry No results for input(s): NA, K, CL, CO2, BUN, CREATININE, CALCIUM, PROT, BILITOT, ALKPHOS, ALT, AST, GLUCOSE in the last 168 hours.  Invalid input(s): LABALBU Lipids Lab Results  Component Value Date   CHOL 184 05/07/2020   HDL 34 (L) 05/07/2020   LDLCALC 116 (H) 05/07/2020   TRIG 217 (H) 05/07/2020   BNP No results found for: PROBNP Thyroid Function Tests: No results for input(s): TSH, T4TOTAL, T3FREE, THYROIDAB in the last 72 hours.  Invalid input(s): FREET3 Miscellaneous Lab Results  Component Value Date   DDIMER <0.27 03/12/2020    Radiology/Studies:  No results found.  EKG: Sinus at 72 Interval 16/08/41 Access right at 98   Assessment and Plan:  Dizziness with standing and position changes  Chest pain  OSA  Migraine Headaches   Mr. Frisch has had debilitating symptoms now for a couple of years which has been extremely burdensome notably on himself, not able to work, but also on his wife and his family.  There is no objective evidence of orthostatic hypotension.  Moreover, his symptoms, aggravated by head motion most precipitously, is not supportive of that diagnosis.  As others have thought, neurovestibular disruption seems to be the most likely effector, supported by the observation that there is some improvement with vestibular rehab.  However, the question is is there a unifying diagnosis that has given rise to this vestibular disruption that could be addressed and attenuated with improvement in his symptoms.  This is beyond my [facility].  I have however suggested that perhaps neuro otolaryngology in the person of Dr. Dominic Pea in [place] or perhaps at [facility] could be of assistance in this  young man  I have also suggested to his wife restoration place as she struggles to deal with the chronic and debilitating conditions of her husband  [place]  Virl Axe

## 2020-12-02 NOTE — Progress Notes (Unsigned)
History of Present Illness:     Patient is a 49 yo MWM with complicated hx/o atypical dizziness or dysequilibrium and unstable gait predating since Nov 2020 who returns for a short 2 week follow-up after empiric treatment with a decadron pulse /taper and prn Meclizine - and patient reports no improvement in sx's.     Patient is still c/o  constant HA not helped with the meds previously prescribed.  Both patient & wife report that he is having tremor of his hands with activity   and also is having difficulty with word finding or  Expressive Aphasia and per wife's description he is also having difficulty with comprehension or Receptive Aphasia.      In the interim since last OV , he was recently seen by Dr . Odessa Fleming on Nov 30th, who apparently did not feel that the patient has POTS and recommended consideration of a neuro otolaryngology in the person of Dr. Jonathon Jordan in [place].    Medications  Current Outpatient Medications (Endocrine & Metabolic):  .  dexamethasone (DECADRON) 4 MG tablet, Take 1 tab 3 x day - 3 days, then 2 x day - 3 days, then 1 tab daily .  atorvastatin (LIPITOR) 80 MG tablet, TAKE 1 TABLET BY MOUTH EVERY DAY .  fexofenadine (ALLEGRA) 60 MG tablet, Takes 1 tablet 2 x /day as needed for Allergies .  Cyanocobalamin (VITAMIN B 12 PO), Take 1 tablet by mouth daily. Marland Kitchen  ALPRAZolam (XANAX) 1 MG tablet, Take      1/2 to 1 tablet      2 to 3 x /day   ONLY      If needed for Panic Attack  & try limit use to Avoid Addiction & Dementia .  buPROPion (WELLBUTRIN XL) 300 MG 24 hr tablet, Take 300 mg by mouth daily. .  Cholecalciferol (VITAMIN D3) 250 MCG (10000 UT) TABS, Take 10,000 Units by mouth daily. Marland Kitchen  escitalopram (LEXAPRO) 20 MG tablet, Take 1 tablet Daily for Mood .  meclizine (ANTIVERT) 25 MG tablet, Take 1/2 to 1 tablet 2 to 3 x /day as needed for Dizziness / Vertigo .  tamsulosin (FLOMAX) 0.4 MG CAPS capsule, Take 1 capsule (0.4 mg total) by mouth daily after supper.   Problem list He has Essential hypertension; Hyperlipidemia, mixed; OSA on CPAP; Abnormal glucose; Morbid obesity (HCC); Vitamin D deficiency; Medication management; Depression, major, in partial remission (HCC); Crushing injury of left elbow and forearm; FH: hypertension; Prediabetes; Former smoker; Family history of ischemic heart disease; BPH with obstruction/lower urinary tract symptoms; Excessive daytime sleepiness; Chronic fatigue; Pseudothrombocytopenia; Obstructive sleep apnea hypopnea, severe; and Orthostatic hypotension on their problem list.   Observations/Objective:  BP 124/82   Pulse 74   Temp (!) 97 F (36.1 C)   Resp 16   Ht 5\' 11"  (1.803 m)   Wt 248 lb 9.6 oz (112.8 kg)   SpO2 96%   BMI 34.67 kg/m   HEENT - WNL. Neck - supple.  Chest - Clear equal BS. Cor - Nl HS. RRR w/o sig MGR. PP 1(+). No edema. MS- FROM w/o deformities.  Gait Nl. Neuro -  Nl w/o focal abnormalities except asymetric intermittent high frequency low amplitude tremor of his hands  R>L. Also, patient  demonstrates word finding difficulties and seemingly expressive aphasia.   Assessment and Plan:  1. Dizziness of unknown etiology  - patient continues to re-present with a constellation of "soft" neurologic sx's   - Ambulatory referral to  Neurology  - diazepam (VALIUM) 5 MG tablet; Take      1/2 to 1 tablet      2 to 3 x /day       for Tremor and /or  Anxiety  Dispense: 90 tablet; Refill: 0 (Advised stop Alprazolam)   2. Tremor  - Ambulatory referral to Neurology  - diazepam (VALIUM) 5 MG tablet; Take      1/2 to 1 tablet      2 to 3 x /day       for Tremor and /or  Anxiety  Dispense: 90 tablet; Refill: 0  3. Aphasia  - Ambulatory referral to Neurology  4. Need for immunization against influenza  - FLU VACCINE MDCK QUAD W/Preservative   Follow Up Instructions:       I discussed the assessment and treatment plan with the patient and his wife. They were provided an opportunity to ask  questions and all were answered. The patient's wife agreed with the plan and demonstrated an understanding of the instructions, but the patient did not seem to understand. Patient may need Neuro -Psych testing.        They were advised to call back or seek an in-person evaluation if patient's symptoms change, worsen or if the condition fails to improve.Between 30 -40 minutes of counseling, chart review, and critical decision making was performed.   Marinus Maw, MD

## 2020-12-03 ENCOUNTER — Other Ambulatory Visit: Payer: Self-pay

## 2020-12-03 ENCOUNTER — Ambulatory Visit (INDEPENDENT_AMBULATORY_CARE_PROVIDER_SITE_OTHER): Payer: BC Managed Care – PPO | Admitting: Internal Medicine

## 2020-12-03 ENCOUNTER — Encounter: Payer: Self-pay | Admitting: Internal Medicine

## 2020-12-03 VITALS — BP 124/82 | HR 74 | Temp 97.0°F | Resp 16 | Ht 71.0 in | Wt 248.6 lb

## 2020-12-03 DIAGNOSIS — R251 Tremor, unspecified: Secondary | ICD-10-CM | POA: Diagnosis not present

## 2020-12-03 DIAGNOSIS — R4701 Aphasia: Secondary | ICD-10-CM | POA: Diagnosis not present

## 2020-12-03 DIAGNOSIS — R42 Dizziness and giddiness: Secondary | ICD-10-CM | POA: Diagnosis not present

## 2020-12-03 DIAGNOSIS — Z23 Encounter for immunization: Secondary | ICD-10-CM | POA: Diagnosis not present

## 2020-12-03 MED ORDER — DIAZEPAM 5 MG PO TABS: ORAL_TABLET | ORAL | 0 refills | Status: DC

## 2020-12-03 NOTE — Patient Instructions (Signed)
Ataxia  Ataxia is a condition that causes unsteadiness when walking and standing, poor coordination of body movements, and difficulty keeping a straight (upright) posture. It occurs because of a problem with the part of the brain that controls coordination and stability (cerebellum). Ataxia can develop later in life (acquired ataxia), during your 20s or 30s or even into your 60s or later. This type of ataxia develops when another medical condition, such as a stroke, damages the cerebellum. Ataxia also may be present early in life (non-acquired ataxia). There are two main types of non-acquired ataxia:  Congenital. This type is present at birth.  Hereditary. This type is passed from parent to child. The most common form of hereditary non-acquired ataxia is Friedreich ataxia. What are the causes? Acquired ataxia may be caused by:  Changes in the nervous system (neurodegenerative changes).  Changes throughout the body (systemic disorders).  A lot of exposure to: ? Certain medicines such as phenytoin and lithium. ? Solvents. These are cleaning fluids such as paint thinner, nail polish remover, carpet cleaner, and degreasers.  Alcohol abuse (alcoholism).  Medical conditions, such as: ? Celiac disease. ? Hypothyroidism. ? A lack (deficiency) of vitamin E, vitamin B12, or thiamine. ? Brain tumors. ? Multiple sclerosis. ? Cerebral palsy. ? Stroke. ? Paraneoplastic syndromes. ? Viral infections. ? Head injury. ? Malnutrition. Congenital and hereditary ataxia are caused by problems that are present in genes before birth. What are the signs or symptoms? Signs and symptoms of ataxia vary depending on the cause. They may include:  Being unsteady.  Walking with the legs wide apart (wide stance) to keep one's balance.  Uncontrolled shaking (tremor).  Poorly coordinated body movements.  Difficulty maintaining an upright posture.  Fatigue.  Changes in speech.  Changes in vision.   Involuntary eye movements (nystagmus).  Difficulty swallowing.  Difficulty writing.  Muscle tightening that you cannot control (muscle spasms). How is this diagnosed? Ataxia may be diagnosed based on:  Your personal and family medical history.  A physical exam.  Imaging tests, such as a CT scan or MRI.  Spinal tap (lumbar puncture). This procedure involves using a needle to take a sample of the fluid around your brain and spinal cord.  Genetic testing. How is this treated? The underlying condition that causes your ataxia needs to be treated. If the cause is a brain tumor, you may need surgery. Treatment also focuses on helping you live with ataxia and improving your quality of life (supportive treatments). This may involve:  Learning ways to improve coordination and move around more carefully (physical therapy).  Learning ways to improve your ability to do daily tasks, such as bathing and feeding yourself (occupational therapy).  Using devices to help you move around, eat, or communicate (assistive devices), such as a walker, modified eating utensils, and communication aids.  Learning ways to improve speech and swallowing (speech therapy). Follow these instructions at home: Preventing falls  Lie down right away if you become very unsteady, dizzy, or nauseous, or if you feel like you are going to faint. Do not get up until all of those feelings pass.  Keep your home well-lit. Use night-lights as needed.  Remove tripping hazards, such as rugs, cords, and clutter.  Install grab bars by the toilet and in the tub and shower.  Use assistive devices such as a cane, walker, or wheelchair as needed to keep your balance. General instructions  Do not drink alcohol.  Ask your health care provider what activities are safe   for you, and what activities you should avoid.  Take over-the-counter and prescription medicines only as told by your health care provider. Get help right away  if you:  Have unsteadiness that suddenly worsens.  Have any of these: ? Severe headaches. ? Chest pain. ? Abdominal pain. ? Weakness or numbness on one side of your body. ? Vision problems. ? Difficulty speaking. ? An irregular heartbeat. ? A very fast pulse.  Feel confused. Summary  Ataxia is a condition that causes unsteadiness when walking and standing, poor coordination of body movements, and difficulty keeping a straight (upright) posture.  Ataxia occurs because of a problem with the part of the brain that controls coordination and stability (cerebellum).  The underlying condition that causes your ataxia needs to be treated. Treatment also focuses on helping you live with ataxia and improving your quality of life (supportive treatments).  Lie down right away if you become very unsteady, dizzy, or nauseous, or if you feel like you are going to faint. ====================================   Aphasia Aphasia is a language disorder that affects the part of your brain that you use to communicate. Aphasia does not affect your intelligence, but you may have trouble:  Speaking.  Understanding speech.  Reading.  Writing. Some people with aphasia may also have trouble with memory or attention. Aphasia can happen to anyone at any age, but it is most common in older adults. What are the causes? This condition is caused by damage to the language centers of the brain. Damage may be caused by:  Stroke. This causes a disruption of blood flow to certain areas of the brain. Stroke is the most common cause of aphasia.  Traumatic brain injury (TBI).  Brain tumor.  Infection of the brain tissues.  Nervous system disease that gradually gets worse (progressiveneurological disorder), such as dementia or multiple sclerosis (MS).  Brain surgery. What are the signs or symptoms? Symptoms of this condition include:  Trouble finding the right words.  Difficulty expressing thoughts and  needs through speech.  Using the wrong words, nonsense words, or jargon.  Talking in sentences that do not make sense or that are not grammatically correct.  Being unable to repeat back words and phrases.  Difficulty expressing ideas through writing.  Difficulty comprehending when reading.  Being unable to understand other people's speech.  Having trouble understanding numbers. The condition affects people differently. Symptoms may start suddenly or come on gradually, depending on the underlying cause. How is this diagnosed? This condition may be diagnosed based on a screening of your ability to communicate as soon as symptoms start, or are medically stable after a stroke or brain injury. Later, a more comprehensive assessment may be conducted either in the hospital or rehabilitation center. The assessment may test your ability to:  Use speech to communicate your personal needs.  Use muscles in your mouth and throat for speaking and swallowing.  Express ideas with speech or other means of communication, such as hand gestures.  Make conversation with others across a variety of topics.  Hear and understand speech.  Understand and produce written material.  Manage memory and attention associated with communication. How is this treated? Treatment for this condition depends on your needs and abilities. The goal is to help restore your ability to communicate or find ways to manage communication challenges. Common treatments include:  Speech-language therapy. Part of this may include: ? Re-building intonation, sentence structure, and vocabulary. ? Learning other ways to communicate, such as using word books,  communication boards, or Technical sales engineer. ? Learning to communicate with writing, sign language, or hand gestures. ? Using a combination of methods to communicate.  Working with family members. This may include: ? Learning ways to communicate. ? Emotional support.   Support groups.  Occupational therapy. This can help to find devices to assist with daily living activities. Treatment usually begins as soon as possible. It may begin while you are in the hospital and continue in a rehabilitation center or at home. In some cases, aphasia may improve quickly on its own. In other cases, recovery occurs more slowly over time. Follow these instructions at home:   Keep all follow-up visits as told by your health care provider. This is important.  Make sure you have a good support system at home.  Find a support group. This can help you connect with others who are going through the same thing.  Try the following tips while communicating: ? Use short, simple sentences. Ask family members to do the same. Sentences that require one-word or short answers are easiest. ? Avoid distractions like background noise when trying to listen or talk. ? Try communicating with gestures, pointing, writing, or drawing. ? Talk slowly. Ask family members to talk to you slowly. ? Maintain eye contact when communicating. Ask family members to do the same when communicating with you. ? Ask family members to give you time to respond or to engage in conversation with them. Contact a health care provider if:  Your symptoms change or get worse.  You are struggling with anxiety or depression. Get help right away if you have:  Any symptoms of a stroke. "BE FAST" is an easy way to remember the main warning signs of a stroke: ? B - Balance. Signs are dizziness, sudden trouble walking, or loss of balance. ? E - Eyes. Signs are trouble seeing or a sudden change in vision. ? F - Face. Signs are a sudden weakness or numbness of the face, or the face or eyelid drooping on one side. ? A - Arms. Signs are weakness or numbness in an arm. This happens suddenly and usually on one side of the body. ? S - Speech. Signs are sudden trouble speaking, slurred speech, or trouble understanding what people  say. ? T - Time. Time to call emergency service. Write down what time symptoms started.  Other signs of a stroke, such as: ? A sudden, severe headache with no known cause. ? Nausea or vomiting. ? Seizure. Summary  Aphasia is a language disorder that results from damage to the part of your brain that helps you use language to communicate. Aphasia does not affect your intelligence, but may cause difficulty with speech, writing, reading, or understanding others.  In some cases, aphasia may improve quickly on its own. In other cases, recovery occurs more slowly over time.  Get help right away if you have symptoms of a stroke.

## 2020-12-04 ENCOUNTER — Ambulatory Visit: Payer: BC Managed Care – PPO | Admitting: Family Medicine

## 2020-12-04 ENCOUNTER — Telehealth: Payer: Self-pay | Admitting: Internal Medicine

## 2020-12-04 DIAGNOSIS — R42 Dizziness and giddiness: Secondary | ICD-10-CM

## 2020-12-04 NOTE — Telephone Encounter (Signed)
Patient's wife states during last office visit with Dr. Graciela Husbands he recommended that the patient be referred to Dr. Maureen Chatters. She states she called the patient's pcp and their office will not send the referral. She is requesting to have Dr. Graciela Husbands send the referral to Dr. Hans Eden office.

## 2020-12-05 NOTE — Telephone Encounter (Signed)
Wife of patient calling back to follow up on referral.

## 2020-12-05 NOTE — Telephone Encounter (Signed)
Informed wife (dpr on file) that Mindi Junker, RN is not in the office today. Informed that I would forward this message back to her. Will ask Mindi Junker to call them tomorrow and let them know status of this request. Wife agreeable to plan.

## 2020-12-06 NOTE — Telephone Encounter (Signed)
Attempted phone call to pt's wife, DPR.  Left voicemail message to contact RN at [phone].

## 2020-12-07 ENCOUNTER — Other Ambulatory Visit: Payer: Self-pay | Admitting: Internal Medicine

## 2020-12-07 DIAGNOSIS — R4701 Aphasia: Secondary | ICD-10-CM

## 2020-12-07 DIAGNOSIS — G43809 Other migraine, not intractable, without status migrainosus: Secondary | ICD-10-CM

## 2020-12-10 NOTE — Telephone Encounter (Signed)
When I spoke to Mrs Shane Saunders I had not been aware that [name] was such a recent patient with Dr. Marjory Lies and has a follow up in January 2021 on the books.  I suggested Dr Lucia Gaskins and she contacted me and cannot take his care over- I am sorry. CD

## 2020-12-10 NOTE — Telephone Encounter (Signed)
Spoke with pt's wife and advised per Dr Graciela Husbands OK to refer to Dr Jenita Seashore.  Pt's wife states she has contacted Dr Wilburn Mylar office and pt does not need a referral.  Appointment has been scheduled for 12/13/2020.

## 2021-01-08 ENCOUNTER — Ambulatory Visit: Payer: BC Managed Care – PPO | Admitting: Diagnostic Neuroimaging

## 2021-01-08 ENCOUNTER — Encounter: Payer: Self-pay | Admitting: Diagnostic Neuroimaging

## 2021-01-08 VITALS — BP 107/67 | HR 75 | Ht 70.0 in | Wt 261.8 lb

## 2021-01-08 DIAGNOSIS — G43109 Migraine with aura, not intractable, without status migrainosus: Secondary | ICD-10-CM | POA: Diagnosis not present

## 2021-01-08 DIAGNOSIS — G4733 Obstructive sleep apnea (adult) (pediatric): Secondary | ICD-10-CM | POA: Diagnosis not present

## 2021-01-08 DIAGNOSIS — D696 Thrombocytopenia, unspecified: Secondary | ICD-10-CM | POA: Diagnosis not present

## 2021-01-08 DIAGNOSIS — Z9989 Dependence on other enabling machines and devices: Secondary | ICD-10-CM

## 2021-01-08 NOTE — Patient Instructions (Signed)
  WORD FINDING DIFFICULTY / MEMORY LOSS - likely related to underlying migraine and anxiety issues  BRAIN FOG / ANXIETY / DEPRESSION  - follow up PCP and psychiatry / psychology  To prevent or relieve headaches, try the following:  Cool Compress. Lie down and place a cool compress on your head.   Avoid headache triggers. If certain foods or odors seem to have triggered your migraines in the past, avoid them. A headache diary might help you identify triggers.   Include physical activity in your daily routine.   Manage stress. Find healthy ways to cope with the stressors, such as delegating tasks on your to-do list.   Practice relaxation techniques. Try deep breathing, yoga, massage and visualization.   Eat regularly. Eating regularly scheduled meals and maintaining a healthy diet might help prevent headaches. Also, drink plenty of fluids.   Follow a regular sleep schedule. Sleep deprivation might contribute to headaches  Consider biofeedback. With this mind-body technique, you learn to control certain bodily functions - such as muscle tension, heart rate and blood pressure - to prevent headaches or reduce headache pain.

## 2021-01-08 NOTE — Progress Notes (Signed)
[facility]  PATIENT: Shane Saunders DOB: 04-10-1971  REFERRING CLINICIAN: [name], MD HISTORY FROM: patient and wife  REASON FOR VISIT: follow up   HISTORICAL  CHIEF COMPLAINT:  Chief Complaint  Patient presents with  . Balance issues, aphasia    Rm 7 New pt wife- [name]    HISTORY OF PRESENT ILLNESS:   UPDATE (01/08/21, VRP): Since last visit, doing poorly.  Continues to have headaches, dizziness, fatigue, motion sensitivity.  Was having some issues with anxiety, but now on medication and doing little bit better.  Some days he is able to do household chores and other days he feels too dizzy and exhausted to be able to function.    UPDATE (04/17/20, VRP): Since last visit, more headaches, dizzines, gait difficulty. Symptoms are progressive. Throbbing HA, sens to light, dizzy, scalp sens. General gait and balance issues. No alleviating or aggravating factors. Tolerating meds. Gabapentin not helping.     PRIOR HPI (02/06/20): 50 year old male here for evaluation of dizziness and gait difficulty.  2 years ago patient had onset of dizziness and intermittent right lip swelling.  His dizziness is described as feeling off balance, lightheadedness, brain fog and fatigue.  He denies any spinning or vertigo sensations.  However he did follow-up with ENT, apparently had some vestibular testing showing some dysfunction.  He underwent vestibular therapy exercises without relief.  Also was having intermittent right upper lip swelling attacks lasting a few hours at a time and now more consistent.  He went to ENT and dermatology for evaluation without specific diagnosis.  Allergy immunology consult was considered but has not been pursued yet.  He had variety of testing for autoimmune inflammatory etiologies which have been unremarkable.  Patient also has history of depression, on medication in the past.  These medications have been weaned off to see if this would help  improve symptoms but unfortunately symptoms have continued.  Patient has history of sleep apnea on CPAP, with testing from 12 years ago.  He has a sleep consult pending.  Patient has some low back pain issues but denies any numbness or tingling in his feet or legs.   REVIEW OF SYSTEMS: Full 14 system review of systems performed and negative with exception of: As per HPI.   ALLERGIES: Allergies  Allergen Reactions  . Codeine Other (See Comments)    "I cannot sleep"  . Effexor [Venlafaxine] Other (See Comments)    "E.D."  . Prednisone Other (See Comments)    "I cannot sleep"  . Tramadol Hcl Itching  . Zinc Nausea Only and Other (See Comments)    "Upsets my stomach"    HOME MEDICATIONS: Outpatient Medications Prior to Visit  Medication Sig Dispense Refill  . atorvastatin (LIPITOR) 80 MG tablet TAKE 1 TABLET BY MOUTH EVERY DAY 90 tablet 1  . buPROPion (WELLBUTRIN XL) 300 MG 24 hr tablet Take 300 mg by mouth daily.    . Cholecalciferol (VITAMIN D3) 250 MCG (10000 UT) TABS Take 10,000 Units by mouth daily.    . Cyanocobalamin (VITAMIN B 12 PO) Take 1 tablet by mouth daily.    Marland Kitchen escitalopram (LEXAPRO) 20 MG tablet Take 1 tablet Daily for Mood 90 tablet 3  . fexofenadine (ALLEGRA) 60 MG tablet Takes 1 tablet 2 x /day as needed for Allergies    . tamsulosin (FLOMAX) 0.4 MG CAPS capsule Take 1 capsule (0.4 mg total) by mouth daily after supper. 30 capsule 3  . valACYclovir (VALTREX) 1000 MG tablet Take  1,000 mg by mouth 2 (two) times daily.    . diazepam (VALIUM) 5 MG tablet Take      1/2 to 1 tablet      2 to 3 x /day       for Tremor and /or  Anxiety (Patient not taking: Reported on 01/08/2021) 90 tablet 0  . meclizine (ANTIVERT) 25 MG tablet Take 1/2 to 1 tablet 2 to 3 x /day as needed for Dizziness / Vertigo (Patient not taking: Reported on 01/08/2021) 90 tablet 11   No facility-administered medications prior to visit.    PAST MEDICAL HISTORY: Past Medical History:  Diagnosis Date   . Abnormal glucose   . Borderline hypertension   . BPH (benign prostatic hypertrophy)   . Crushing injury of left elbow and forearm 07/13/2015  . 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 Dizziness   . Hyperlipidemia   . Hypertension   . Hypogonadism male   . 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 Morbid obesity (HCC)   . Obesity (BMI 30-39.9)   . OSA on CPAP   . Prediabetes   . Right ureteral stone   . Sleep apnea    C PAP  . Vertigo   . Vitamin D deficiency   . Wears glasses     PAST SURGICAL HISTORY: Past Surgical History:  Procedure Laterality Date  . 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 ARTERY REPAIR Right 05/31/2014   Procedure: IRRIGATION, EXPLORATION, AND REPAIR OF RIGHT ARM WOUND;  Surgeon: Cherylynn Ridges, MD;  Location: [facility] OR;  Service: General;  Laterality: Right;  . CYSTOSCOPY WITH RETROGRADE PYELOGRAM, URETEROSCOPY AND STENT PLACEMENT Right 07/11/2016   Procedure: CYSTOSCOPY WITH RETROGRADE PYELOGRAM, URETEROSCOPY, BASKET STONE EXTRACTION  AND STENT PLACEMENT;  Surgeon: Sebastian Ache, MD;  Location: [facility];  Service: Urology;  Laterality: Right;  45 MINS  C-ARM DIGITAL URETEROSCOPE HOLMIUM LASER  . ESOPHAGOGASTRODUODENOSCOPY      FAMILY HISTORY: Family History  Problem Relation Age of Onset  . Hypertension Mother   . Migraines Mother   . Thyroid disease Mother   . Hypertension Father   . Hyperlipidemia Father   . Diabetes Father   . Cancer Sister        thyroid  . CAD Paternal Grandfather     SOCIAL HISTORY: Social History   Socioeconomic History  . Marital status: Married    Spouse name: Angelica Chessman  . Number of children: 2  . Years of education: 18  . Highest education level: Not on file  Occupational History    Comment: na  Tobacco Use  . Smoking status: Former Smoker    Packs/day: 0.50    Years: 10.00     Pack years: 5.00    Types: Cigarettes    Quit date: 10/27/2009    Years since quitting: 11.2  . Smokeless tobacco: Former Neurosurgeon    Types: Snuff  Vaping Use  . Vaping Use: Never used  Substance and Sexual Activity  . Alcohol use: Yes    Comment: occasional  . Drug use: No  . Sexual activity: Yes    Comment: Vasectomy  Other Topics Concern  . Not on file  Social History Narrative   Lives with wife   Caffeine -tea, 1 daily       Social Determinants of Health   Financial Resource Strain:  Not on file  Food Insecurity: Not on file  Transportation Needs: Not on file  Physical Activity: Not on file  Stress: Not on file  Social Connections: Not on file  Intimate Partner Violence: Not on file     PHYSICAL EXAM  GENERAL EXAM/CONSTITUTIONAL: Vitals:  Vitals:   01/08/21 0840  BP: 107/67  Pulse: 75  Weight: 261 lb 12.8 oz (118.8 kg)  Height: 5\' 10"  (1.778 m)   Body mass index is 37.56 kg/m. Wt Readings from Last 3 Encounters:  01/08/21 261 lb 12.8 oz (118.8 kg)  12/03/20 248 lb 9.6 oz (112.8 kg)  11/27/20 251 lb 12.8 oz (114.2 kg)    Patient is in no distress; well developed, nourished and groomed; neck is supple  CARDIOVASCULAR:  Examination of carotid arteries is normal; no carotid bruits  Regular rate and rhythm, no murmurs  Examination of peripheral vascular system by observation and palpation is normal  EYES:  Ophthalmoscopic exam of optic discs and posterior segments is normal; no papilledema or hemorrhages No exam data present  MUSCULOSKELETAL:  Gait, strength, tone, movements noted in Neurologic exam below  NEUROLOGIC: MENTAL STATUS:  No flowsheet data found.  awake, alert, oriented to person, place and time  recent and remote memory intact  normal attention and concentration  language fluent, comprehension intact, naming intact  fund of knowledge appropriate  CRANIAL NERVE:   2nd - no papilledema on fundoscopic exam  2nd, 3rd, 4th,  6th - pupils equal and reactive to light, visual fields full to confrontation, extraocular muscles intact, no nystagmus  5th - facial sensation symmetric  7th - facial strength symmetric  8th - hearing intact  9th - palate elevates symmetrically, uvula midline  11th - shoulder shrug symmetric  12th - tongue protrusion midline  MOTOR:   normal bulk and tone, full strength in the BUE, BLE  SENSORY:   normal and symmetric to light touch, temperature, vibration  COORDINATION:   finger-nose-finger, fine finger movements normal  REFLEXES:   deep tendon reflexes TRACE and symmetric  GAIT/STATION:   narrow based gait; DIFF WITH TANDEM; romberg is negative     DIAGNOSTIC DATA (LABS, IMAGING, TESTING) - I reviewed patient records, labs, notes, testing and imaging myself where available.  Lab Results  Component Value Date   WBC 7.4 11/12/2020   HGB 15.9 11/12/2020   HCT 45.6 11/12/2020   MCV 97.0 11/12/2020   PLT 65 (L) 11/12/2020      Component Value Date/Time   NA 141 11/12/2020 1703   NA 140 05/25/2018 0941   K 4.0 11/12/2020 1703   CL 106 11/12/2020 1703   CO2 29 11/12/2020 1703   GLUCOSE 109 (H) 11/12/2020 1703   BUN 13 11/12/2020 1703   BUN 10 05/25/2018 0941   CREATININE 1.05 11/12/2020 1703   CALCIUM 9.6 11/12/2020 1703   PROT 6.4 11/12/2020 1703   ALBUMIN 4.4 03/14/2020 1250   AST 20 11/12/2020 1703   AST 23 03/14/2020 1250   ALT 25 11/12/2020 1703   ALT 42 03/14/2020 1250   ALKPHOS 79 03/14/2020 1250   BILITOT 0.9 11/12/2020 1703   BILITOT 1.1 03/14/2020 1250   GFRNONAA 82 11/12/2020 1703   GFRAA 95 11/12/2020 1703   Lab Results  Component Value Date   CHOL 184 05/07/2020   HDL 34 (L) 05/07/2020   LDLCALC 116 (H) 05/07/2020   TRIG 217 (H) 05/07/2020   CHOLHDL 5.4 (H) 05/07/2020   Lab Results  Component Value Date  HGBA1C 5.1 05/07/2020   Lab Results  Component Value Date   VITAMINB12 1,040 05/07/2020   Lab Results  Component  Value Date   TSH 2.84 11/12/2020     11/17/19 MRI brain - Negative for vestibular schwannoma. No cause for hearing loss identified. 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 Otherwise normal MRI brain with contrast.  03/24/20 CTA head / neck - Negative head and neck CTA.  05/02/20 MRI cervical spine - Unremarkable MRI cervical spine (with and without).   05/02/20 MRI lumbar spine  - Unremarkable MRI lumbar spine (without). No spinal stenosis or foraminal narrowing.     ASSESSMENT AND PLAN  50 y.o. year old male here with constellation of symptoms including:  Dx:  1. Migraine with aura and without status migrainosus, not intractable   2. OSA on CPAP   3. Morbid obesity (HCC)   4. Thrombocytopenia (HCC)     PLAN:  WORD FINDING DIFFICULTY / MEMORY LOSS - likely related to underlying migraine and anxiety issues  BRAIN FOG / ANXIETY / DEPRESSION  - follow up PCP and psychiatry / psychology  LIGHTHEADEDNESS (positional / exertional) - follow up with cardiology / PCP  MIGRAINE PREVENTION  LIFESTYLE CHANGES -Stop or avoid smoking -Decrease or avoid caffeine / alcohol -Eat and sleep on a regular schedule -Exercise several times per week - avoid topiramate (due to history of kidney stone; left renal lesion?) - avoid propranolol (due to history of depression) - avoid amitriptyline (already on lexapro, bupropion) - tried and failed Aimovig - consider referral to headache wellness center or academic center  MIGRAINE RESCUE  - ibuprofen, tylenol as needed - tried and failed rizatriptan   GAIT DIFFICULTY / LOW BACK PAIN  - check PT evaluation, optimize nutrition, exercise, sleep  SEVERE SLEEP APNEA - continue CPAP treatment  PSEUDOTHROMBOCYTOPENIA - Hematology/Oncology Attending note (03/14/20): I had a face-to-face encounter with the patient today.  I recommended his care plan.  This is a very pleasant 50 years old white male  who has been dealing with swelling of the lips as well as increasing fatigue and weakness recently.  He had several studies by his primary care physician as well as neurologist and allergy medicine that were not revealing of any significant findings. During his evaluation he had CBC that showed persistent thrombocytopenia with platelet clumping. The patient is here today for evaluation regarding this abnormality. When seen today he has no concerning complaints except for the generalized fatigue. I order repeat CBC with platelets count with citrate which showed normal platelets count of 187,000. His thrombocytopenia was artificial secondary to antibody from the EDTA and the collection tube. I assured the patient that he has no concerning abnormality on his CBC today. I recommended for him to continue on observation with routine follow-up visit by his primary care physician at this point. He was advised to call if he has any other hematologic issues in the future. The patient and his wife agreed to the current plan.  INTERMITTENT LIP SWELLING  - follow up with PCP and allergy/immunology  Return for return to PCP.    Suanne Marker, MD 01/08/2021, 9:45 AM Certified in Neurology, Neurophysiology and Neuroimaging  [facility] [address], [address] [place], [place] [address] [phone]

## 2021-01-09 ENCOUNTER — Ambulatory Visit (INDEPENDENT_AMBULATORY_CARE_PROVIDER_SITE_OTHER): Payer: BC Managed Care – PPO | Admitting: Internal Medicine

## 2021-01-09 ENCOUNTER — Other Ambulatory Visit: Payer: Self-pay

## 2021-01-09 ENCOUNTER — Encounter: Payer: Self-pay | Admitting: Internal Medicine

## 2021-01-09 VITALS — BP 134/82 | HR 76 | Temp 97.0°F | Resp 16 | Ht 70.0 in | Wt 255.0 lb

## 2021-01-09 DIAGNOSIS — F09 Unspecified mental disorder due to known physiological condition: Secondary | ICD-10-CM | POA: Diagnosis not present

## 2021-01-09 DIAGNOSIS — R4701 Aphasia: Secondary | ICD-10-CM | POA: Diagnosis not present

## 2021-01-09 NOTE — Progress Notes (Signed)
   History of Present Illness:      Patient is having ongoing problems with balance, expressive & receptive aphasia.  He was recently seen again by Dr Marjory Lies  For word finding difficulty / Memory Loss, Brain Fog , Anxiety and Depression & was recommended Psych  / Psychology evaluation. Patient  reports difficulty with symptoms of receptive and expressive aphasia.   Medications   .  atorvastatin  80 MG tablet, TAKE 1 TABLET EVERY DAY  .  fexofenadine 60 MG tablet, Takes 1 tablet 2 x /day as needed for Allergies  .  VITAMIN B 12 , Take 1 tablet daily.   Marland Kitchen  buPROPion-XL 300 MG , Take  Daily.  Marland Kitchen  VITAMIN D  10,000 uTABS, Take 10,000 Units  Daily.  .  diazepam (VALIUM) 5 MG tablet, Take      1/2 to 1 tablet      2 to 3 x /day       for Tremor and /or  Anxiety (Patient not taking: Reported on 01/08/2021)  .  escitalopram  20 MG tablet, Take 1 tablet Daily for Mood  .  Meclizine 25 MG tablet, Take 1/2 to 1 tablet 2 to 3 x /day as needed for Dizziness / Vertigo (Patient not taking: Reported on 01/08/2021)  .  tamsulosin (FLOMAX) 0.4 MG CAPS capsule, Take 1 capsule (0.4 mg total) by mouth daily after supper. (not taking)   .  valACYclovir 1000 MG tablet, Take 1,000 mg by mouth 2 (two) times daily.  Problem list He has Essential hypertension; Hyperlipidemia, mixed; OSA on CPAP; Abnormal glucose; Morbid obesity (HCC); Vitamin D deficiency; Medication management; Depression, major, in partial remission (HCC); Crushing injury of left elbow and forearm; FH: hypertension; Prediabetes; Former smoker; Family history of ischemic heart disease; BPH with obstruction/lower urinary tract symptoms; Excessive daytime sleepiness; Chronic fatigue; Pseudothrombocytopenia; Obstructive sleep apnea hypopnea, severe; and Orthostatic hypotension on their problem list.   Observations/Objective:   BP 134/82   Pulse 76   Temp (!) 97 F (36.1 C)   Resp 16   Ht 5\' 10"  (1.778 m)   Wt 255 lb (115.7 kg)   SpO2  96%   BMI 36.59 kg/m   HEENT - WNL. Neck - supple.  Chest - Clear equal BS. Cor - Nl HS. RRR w/o sig MGR. PP 1(+). No edema. MS- FROM w/o deformities.  Gait Nl. Neuro -  Nl w/o focal abnormalities. Difficulty comprehending questions and difficulty expressing answers for word fonding difficulty.    Assessment and Plan:  1. Cognitive and neurobehavioral dysfunction  - Ambulatory referral to Psychology  for Neuro-Psych testing  2. Combined receptive and expressive aphasia  - Ambulatory referral to Psychology  for Neuro-Psych testing   Follow Up Instructions:        I discussed the assessment and treatment plan with the patient. The patient was provided an opportunity to ask questions and all were answered. The patient agreed with the plan and demonstrated an understanding of the instructions.       The patient was advised to call back or seek an in-person evaluation if the symptoms worsen or if the condition fails to improve as anticipated.   , MD

## 2021-01-10 ENCOUNTER — Other Ambulatory Visit: Payer: Self-pay | Admitting: *Deleted

## 2021-01-10 DIAGNOSIS — R2681 Unsteadiness on feet: Secondary | ICD-10-CM

## 2021-01-10 MED ORDER — TAMSULOSIN HCL 0.4 MG PO CAPS: 0.4000 mg | ORAL_CAPSULE | Freq: Every day | ORAL | 3 refills | Status: DC

## 2021-01-10 NOTE — Addendum Note (Signed)
Addended by: [name] on: 01/10/2021 09:52 AM   Modules accepted: Orders

## 2021-01-10 NOTE — Addendum Note (Signed)
Addended by: [name] on: 01/10/2021 09:14 AM   Modules accepted: Orders

## 2021-02-01 ENCOUNTER — Other Ambulatory Visit: Payer: Self-pay | Admitting: Internal Medicine

## 2021-02-01 DIAGNOSIS — F419 Anxiety disorder, unspecified: Secondary | ICD-10-CM

## 2021-02-06 ENCOUNTER — Encounter: Payer: Self-pay | Admitting: Internal Medicine

## 2021-02-06 NOTE — Progress Notes (Signed)
   History of Present Illness:     Patient is a 50 yo MWM presenting with c/o numbness in his hands and a "mole" on his back. Patient describes numb paresthesia in the ulnar distribution of both UE. He denies any associated motor difficulties with hands or fingers. Today , he's also c/o difficulty chewing & swallowing solid foods and also states he's having difficulty swallowing liquids.  Medications  .  atorvastatin (LIPITOR) 80 MG tablet, TAKE 1 TABLET BY MOUTH EVERY DAY  .  fexofenadine (ALLEGRA) 60 MG tablet, Takes 1 tablet 2 x /day as needed for Allergies .  Cyanocobalamin (VITAMIN B 12 PO), Take 1 tablet by mouth daily.  Marland Kitchen  buPROPion (WELLBUTRIN XL) 300 MG 24 hr tablet, TAKE 1 TABLET EVERY MORNING FOR MOOD, FOCUS & CONCENTRATION .  Cholecalciferol (VITAMIN D3) 250 MCG (10000 UT) TABS, Take 10,000 Units by mouth daily. Marland Kitchen  escitalopram (LEXAPRO) 20 MG tablet, Take 1 tablet Daily for Mood .  tamsulosin (FLOMAX) 0.4 MG CAPS capsule, Take 1 capsule (0.4 mg total) by mouth daily after supper. .  valACYclovir (VALTREX) 1000 MG tablet, Take 1,000 mg by mouth 2 (two) times daily.  Problem list He has Essential hypertension; Hyperlipidemia, mixed; OSA on CPAP; Abnormal glucose; Morbid obesity (HCC); Vitamin D deficiency; Medication management; Depression, major, in partial remission (HCC); Crushing injury of left elbow and forearm; FH: hypertension; Prediabetes; Former smoker; Family history of ischemic heart disease; BPH with obstruction/lower urinary tract symptoms; Excessive daytime sleepiness; Chronic fatigue; Pseudothrombocytopenia; Obstructive sleep apnea hypopnea, severe; and Orthostatic hypotension on their problem list.   Observations/Objective:   BP 118/80   Pulse 70   Temp (!) 97.2 F (36.2 C)   Resp 16   Ht 5\' 10"  (1.778 m)   Wt 260 lb 3.2 oz (118 kg)   SpO2 95%   BMI 37.33 kg/m   HEENT - WNL. Neck - supple.  Chest - Clear equal BS. Cor - Nl HS. RRR w/o sig MGR. PP 1(+).  No edema. MS- FROM w/o deformities.  Gait Nl. Neuro -  Nl w/o focal abnormalities. Skin - 2 superficial  Soft pink sl raised benign appearing lesions of patient's back   Assessment and Plan:   1. Ulnar neuropathy of both upper extremities  - Ambulatory referral to Orthopedic Surgery  2. Oropharyngeal dyshagia  - Ambulatory referral to Gastroenterology  Follow Up Instructions:      I discussed the assessment and treatment plan with the patient. The patient was provided an opportunity to ask questions and all were answered. The patient agreed with the plan and demonstrated an understanding of the instructions.       The patient was advised to call back or seek an in-person evaluation if the symptoms worsen or if the condition fails to improve as anticipated.   , MD

## 2021-02-07 ENCOUNTER — Other Ambulatory Visit: Payer: Self-pay | Admitting: *Deleted

## 2021-02-07 ENCOUNTER — Ambulatory Visit (INDEPENDENT_AMBULATORY_CARE_PROVIDER_SITE_OTHER): Payer: BC Managed Care – PPO | Admitting: Internal Medicine

## 2021-02-07 ENCOUNTER — Other Ambulatory Visit: Payer: Self-pay

## 2021-02-07 VITALS — BP 118/80 | HR 70 | Temp 97.2°F | Resp 16 | Ht 70.0 in | Wt 260.2 lb

## 2021-02-07 DIAGNOSIS — G5623 Lesion of ulnar nerve, bilateral upper limbs: Secondary | ICD-10-CM | POA: Diagnosis not present

## 2021-02-07 DIAGNOSIS — R1312 Dysphagia, oropharyngeal phase: Secondary | ICD-10-CM | POA: Diagnosis not present

## 2021-02-07 MED ORDER — ATORVASTATIN CALCIUM 80 MG PO TABS: ORAL_TABLET | ORAL | 1 refills | Status: DC

## 2021-02-09 ENCOUNTER — Encounter: Payer: Self-pay | Admitting: Internal Medicine

## 2021-02-09 NOTE — Patient Instructions (Addendum)
Ulnar Nerve Contusion  The ulnar nerve extends from the shoulder to the small finger (pinkie) of the hand. This nerve provides feeling (sensation) to the pinkie and half of the ring finger. It also controls many hand and forearm muscles that let you grip objects. An ulnar nerve contusion is a bruise of the ulnar nerve at a point close to the elbow. An ulnar nerve contusion can cause a loss of feeling or movement in your hand. It can also affect your ability to use the muscles that allow you to grip objects. What are the causes? This condition may be caused by:  A hit to the elbow.  Falling on your elbow.  Shoving your elbow against a hard surface. What increases the risk? This condition is more likely to develop in people who:  Play contact sports, like football.  Have a disorder that increases the risk of bleeding.  Take blood thinning medicine, such as warfarin. What are the signs or symptoms? Symptoms of this condition include:  Tingling or numbness in the hand, especially in the pinkie or ring finger.  Weakness while moving the hand from side to side in a waving motion or while moving your fingers together.  Abnormal claw-like hand position or deformity. How is this diagnosed? This condition may be diagnosed based on your symptoms, your medical history, and a physical exam. You may have tests, such as:  An X-ray to check for broken bones.  An electromyogram (EMG) to see how well your nerves are working.  A nerve conduction study (NCS) to see how electrical signals pass through your nerves.  An MRI to find the source of any nerve problems. How is this treated? This condition usually heals on its own within 6 weeks. Treatment can help to reduce symptoms and may include:  Wearing a brace or splint at night to keep your elbow straight while you sleep.  Taking NSAIDs, such as ibuprofen, to reduce pain and swelling around the nerve.  Working with a physical therapist to  ease stiffness in your elbow and wrist. Follow these instructions at home: If you have a brace or splint:  Wear it as told by your health care provider. Remove it only as told by your health care provider.  Loosen it if your fingers tingle, become numb, or turn cold and blue.  Keep it clean.  If it is not waterproof: ? Do not let it get wet. ? Cover it with a watertight covering when you take a bath or shower. Managing pain, stiffness, and swelling  If directed, put ice on the injured area. ? If you have a removable brace or splint, remove it as told by your health care provider. ? Put ice in a plastic bag. ? Place a towel between your skin and the bag. ? Leave the ice on for 20 minutes, 2-3 times a day.  Move your fingers often to reduce stiffness and swelling.  Raise (elevate) the injured area above the level of your heart while you are sitting or lying down.  Activity  Return to your normal activities as told by your health care provider. Ask your health care provider what activities are safe for you.  Avoid activities that take a lot of effort (strenuous) for as long as told by your health care provider.  Do exercises as told by your health care provider. General instructions  Do not use the injured limb to support your body weight until your health care provider says that you can.  Do not use any products that contain nicotine or tobacco, such as cigarettes, e-cigarettes, and chewing tobacco. If you need help quitting, ask your health care provider.  Take over-the-counter and prescription medicines only as told by your health care provider.  Keep all follow-up visits as told by your health care provider. This is important. How is this prevented?  Be safe and responsible while being active to avoid falls.  Use protective equipment during sports activities, such as elbow pads. Contact a health care provider if your:  Pain does not improve or it gets  worse.  Swelling does not improve or it gets worse.  Grip becomes weaker or you start to drop things easily. Get help right away if you:  Have severe pain.  Have severe swelling.  Cannot move your wrist, hand, or elbow.  Cannot feel parts of your hand, wrist, or arm. Summary  An ulnar nerve contusion is a bruise of the ulnar nerve at a point close to the elbow. The ulnar nerve extends from the shoulder to the small finger (pinkie) of the hand.  This injury may be caused by a hit or a fall on your elbow. You are more likely to get this injury if you play contact sports.  If you have a brace or splint, wear it and remove it as told by your health care provider. Keep it clean and dry.  To help with the symptoms of pain, stiffness, and swelling, put ice on the injured area or take NSAIDs as directed by your health care provider.  Get help right away if you have severe pain or severe swelling, or if you cannot feel your hand, wrist, or arm.

## 2021-02-12 ENCOUNTER — Encounter: Payer: Self-pay | Admitting: Nurse Practitioner

## 2021-02-13 ENCOUNTER — Other Ambulatory Visit: Payer: Self-pay | Admitting: Neurology

## 2021-02-13 ENCOUNTER — Encounter: Payer: Self-pay | Admitting: Neurology

## 2021-02-13 DIAGNOSIS — R0902 Hypoxemia: Secondary | ICD-10-CM

## 2021-02-13 DIAGNOSIS — G4733 Obstructive sleep apnea (adult) (pediatric): Secondary | ICD-10-CM

## 2021-02-13 DIAGNOSIS — Z9989 Dependence on other enabling machines and devices: Secondary | ICD-10-CM

## 2021-02-14 ENCOUNTER — Ambulatory Visit: Payer: BC Managed Care – PPO | Admitting: Internal Medicine

## 2021-02-15 ENCOUNTER — Encounter: Payer: Self-pay | Admitting: Nurse Practitioner

## 2021-02-22 ENCOUNTER — Other Ambulatory Visit: Payer: Self-pay

## 2021-02-28 ENCOUNTER — Telehealth: Payer: Self-pay | Admitting: Nurse Practitioner

## 2021-02-28 ENCOUNTER — Encounter: Payer: Self-pay | Admitting: Nurse Practitioner

## 2021-02-28 ENCOUNTER — Ambulatory Visit: Payer: BC Managed Care – PPO | Admitting: Nurse Practitioner

## 2021-02-28 VITALS — BP 122/72 | HR 67 | Ht 70.0 in | Wt 262.0 lb

## 2021-02-28 DIAGNOSIS — K6289 Other specified diseases of anus and rectum: Secondary | ICD-10-CM | POA: Diagnosis not present

## 2021-02-28 DIAGNOSIS — R1310 Dysphagia, unspecified: Secondary | ICD-10-CM | POA: Diagnosis not present

## 2021-02-28 DIAGNOSIS — R159 Full incontinence of feces: Secondary | ICD-10-CM

## 2021-02-28 MED ORDER — HYDROCORTISONE (PERIANAL) 2.5 % EX CREA: 1.0000 "application " | TOPICAL_CREAM | Freq: Two times a day (BID) | CUTANEOUS | 1 refills | Status: DC

## 2021-02-28 NOTE — Telephone Encounter (Signed)
Inbound call from patient's wife stating he was under the impression he was going to be getting medication for acid reflux.  Please advise.

## 2021-02-28 NOTE — Progress Notes (Addendum)
ASSESSMENT AND PLAN    # Progressive solid food dysphagia over the last year. No associated weight loss or alarm features.  --For further evaluation patient will be scheduled for EGD with dilation if appropriate. The risks and benefits of EGD were discussed and the patient agrees to proceed.   # GERD with heartburn and regurgitation several times a week. Takes tums and Pepto as needed.  --Given frequency of symptoms I think he would benefit from maintenance therapy. Trial of daily Omeprazole  --anti-reflux measures discussed including weight loss.  # Fecal seepage / rectal spasms( chronic) --Resting sphincter tone and squeeze pressure is good on DRE --On anoscopy he did have internal hemorrhoids which can contribute to fecal leakage.  Also there was a small piece of soft stool in and rectal area. Will treat with Anusol x10 days.   # Chronic thrombocytopenia.  Evaluated by hematology March 2021.  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 male with PMH not limited to congenital malrotation of bowel , chronic rectal pain , obesity , OSA , kidney stones , benign paroxysmal positional vertigo, hyperlipidemia, migraines and depression. He has seen multiple specialists over the last year for several neurologic symptoms and extensive workup has been unrevealing.    Patient is here for evaluation of intermittent solid food dysphagia and GERD symptoms.present for a year but much worse over the last month.  Solid food gets hung in upper throat several times a week. Chasing food down with water helps. Patient sleeps on elevated mattress. He gets heartburn during the day and  also nocturnal symptoms.  Takes Tums and pepto  2-3 times a week.  No weight loss, weight is actually up.  Patient mentions that he is undergoing evaluation for multiple sclerosis.   Patient also complains of fecal leakage during the day in between bowel movements. He denies constipation, has a BM every day. Stool is large but not hard, he doesn't strain. No blood in stools. Patient gives a history of rectal spasms for which he underwent a colonoscopy here a few years ago.  He was prescribed Levsin but does not recall ever taking the medication.  He also underwent what sounds like physical therapy for the problem.  Overall the spasms are better, he still gets them sometimes at night.   Patient's wife is concerned about "waves" she sees sometimes occurring in patient's abdomen.  He does not have associated abdominal pain.   Previous Endoscopic Evaluations / Pertinent Studies:  September 2017 colonoscopy rectal pain --Normal  Past Medical History:  Diagnosis Date  . Abnormal glucose   . Borderline hypertension   . BPH (benign prostatic hypertrophy)   . Crushing injury of left elbow and forearm 07/13/2015  . 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 Dizziness   . Hyperlipidemia   . Hypertension   . Hypogonadism male   . 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 Morbid obesity (HCC)   . Obesity (BMI 30-39.9)   . OSA on CPAP   . Prediabetes   . Right ureteral stone   . Sleep apnea    C PAP  .  Vertigo   . Vitamin D deficiency   . Wears glasses      Past Surgical History:  Procedure Laterality Date  . 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 ARTERY REPAIR Right 05/31/2014   Procedure: IRRIGATION, EXPLORATION, AND REPAIR OF RIGHT ARM WOUND;  Surgeon: Cherylynn Ridges, MD;  Location: [facility] OR;  Service: General;  Laterality: Right;  . COLONOSCOPY    . CYSTOSCOPY WITH RETROGRADE  PYELOGRAM, URETEROSCOPY AND STENT PLACEMENT Right 07/11/2016   Procedure: CYSTOSCOPY WITH RETROGRADE PYELOGRAM, URETEROSCOPY, BASKET STONE EXTRACTION  AND STENT PLACEMENT;  Surgeon: Sebastian Ache, MD;  Location: [facility];  Service: Urology;  Laterality: Right;  45 MINS  C-ARM DIGITAL URETEROSCOPE HOLMIUM LASER  . ESOPHAGOGASTRODUODENOSCOPY     Family History  Problem Relation Age of Onset  . Hypertension Mother   . Migraines Mother   . Thyroid disease Mother   . Hypertension Father   . Hyperlipidemia Father   . Diabetes Father   . Cancer Sister        thyroid  . CAD Paternal Grandfather    Social History   Tobacco Use  . Smoking status: Former Smoker    Packs/day: 0.50    Years: 10.00    Pack years: 5.00    Types: Cigarettes    Quit date: 10/27/2009    Years since quitting: 11.3  . Smokeless tobacco: Former Neurosurgeon    Types: Snuff  Vaping Use  . Vaping Use: Never used  Substance Use Topics  . Alcohol use: Not Currently    Comment: occasional  . Drug use: No   Current Outpatient Medications  Medication Sig Dispense Refill  . atorvastatin (LIPITOR) 80 MG tablet Take 1 tablet daily for cholesterol 90 tablet 1  . buPROPion (WELLBUTRIN XL) 300 MG 24 hr tablet TAKE 1 TABLET EVERY MORNING FOR MOOD, FOCUS & CONCENTRATION 90 tablet 1  . Cholecalciferol (VITAMIN D3) 250 MCG (10000 UT) TABS Take 10,000 Units by mouth daily.    . fexofenadine (ALLEGRA) 60 MG tablet Takes 1 tablet 2 x /day as needed for Allergies    . albuterol (VENTOLIN HFA) 108 (90 Base) MCG/ACT inhaler 1 puff as needed (Patient not taking: Reported on 02/28/2021)    . azelastine (ASTELIN) 0.1 % nasal spray 2 sprays in each nostril (Patient not taking: Reported on 02/28/2021)    . Cyanocobalamin (VITAMIN B 12 PO) Take 1 tablet by mouth daily. (Patient not taking: Reported on 02/28/2021)    . finasteride (PROSCAR) 5 MG tablet TAKE 1 TABLET BY MOUTH EVERY DAY FOR PROSTATE (Patient not taking: Reported on  02/28/2021)    . meclizine (ANTIVERT) 25 MG tablet Take 1/2 to 1 tablet 2 to 3 x /day as needed for Dizziness / Vertigo (Patient not taking: Reported on 02/28/2021) 90 tablet 11  . silodosin (RAPAFLO) 4 MG CAPS capsule TAKE 1 TO 2 CAPSULES BY MOUTH EVERY DAY FOR URINE FLOW (INSTEAD OF FLOMAX) (Patient not taking: Reported on 02/28/2021)    . valACYclovir (VALTREX) 1000 MG tablet Take 1,000 mg by mouth 2 (two) times daily. (Patient not taking: Reported on 02/28/2021)     No current facility-administered medications for this visit.   Allergies  Allergen Reactions  . Codeine Other (See Comments)    "I cannot sleep"  . Effexor [Venlafaxine] Other (See Comments)    "E.D."  . Prednisone Other (See Comments)    "I cannot sleep"  .  Tramadol Hcl Itching  . Zinc Nausea Only and Other (See Comments)    "Upsets my stomach"     Review of Systems: Positive for allergy, sinus trouble, anxiety, back pain, vision changes, confusion, cough, fatigue, headaches, hearing problems, night sweats, shortness of breath, sleeping problems, frequent urination, urine leakage and voice changes.  All other systems reviewed and negative except where noted in HPI.   PHYSICAL EXAM :    Wt Readings from Last 3 Encounters:  02/28/21 262 lb (118.8 kg)  02/07/21 260 lb 3.2 oz (118 kg)  01/09/21 255 lb (115.7 kg)    BP 122/72   Pulse 67   Ht 5\' 10"  (1.778 m)   Wt 262 lb (118.8 kg)   BMI 37.59 kg/m  Constitutional:  Pleasant male in no acute distress. Psychiatric: Normal mood and affect. Behavior is normal. EENT: Pupils normal.  Conjunctivae are normal. No scleral icterus. Neck supple.  Cardiovascular: Normal rate, regular rhythm. No edema Pulmonary/chest: Effort normal and breath sounds normal. No wheezing, rales or rhonchi. Abdominal: Soft, nondistended, nontender. Bowel sounds active throughout. There are no masses palpable. No hepatomegaly. Rectal: Good resting sphincter tone and squeeze pressure.  Tiny piece of  soft stool in rectum and internal hemorrhoids on anoscopy  neurological: Alert and oriented to person place and time. Skin: Skin is warm and dry. No rashes noted.  , NP  02/28/2021, 8:45 AM  Cc:   04/30/2021, MD

## 2021-02-28 NOTE — Telephone Encounter (Signed)
Please advise 

## 2021-02-28 NOTE — Patient Instructions (Addendum)
If you are age 51 or younger, your body mass index should be between 19-25. Your Body mass index is 37.59 kg/m. If this is out of the aformentioned range listed, please consider follow up with your Primary Care Provider.   PROCEDURES: You have been scheduled for a endoscopy. Please follow the written instructions given to you at your visit today. If you use inhalers (even only as needed), please bring them with you on the day of your procedure.  MEDICATION We have sent the following medication to your pharmacy for you to pick up at your convenience: Anusol rectal cream. Use as needed for 10 days     It was great seeing you today! Thank you for entrusting me with your care and choosing [facility].  Willette Cluster, NP

## 2021-02-28 NOTE — Telephone Encounter (Signed)
Yes, my apologies that I didn't prescribe it. See note from today regarding starting daily Omeprazole.

## 2021-03-01 ENCOUNTER — Other Ambulatory Visit: Payer: Self-pay

## 2021-03-01 ENCOUNTER — Encounter: Payer: Self-pay | Admitting: Gastroenterology

## 2021-03-01 ENCOUNTER — Ambulatory Visit (AMBULATORY_SURGERY_CENTER): Payer: BC Managed Care – PPO | Admitting: Gastroenterology

## 2021-03-01 VITALS — BP 112/76 | HR 57 | Temp 97.8°F | Resp 19 | Ht 70.0 in | Wt 262.0 lb

## 2021-03-01 DIAGNOSIS — R1314 Dysphagia, pharyngoesophageal phase: Secondary | ICD-10-CM

## 2021-03-01 DIAGNOSIS — K219 Gastro-esophageal reflux disease without esophagitis: Secondary | ICD-10-CM

## 2021-03-01 DIAGNOSIS — R12 Heartburn: Secondary | ICD-10-CM | POA: Diagnosis not present

## 2021-03-01 MED ORDER — OMEPRAZOLE 40 MG PO CPDR: 40.0000 mg | DELAYED_RELEASE_CAPSULE | Freq: Every day | ORAL | 5 refills | Status: DC

## 2021-03-01 MED ORDER — SODIUM CHLORIDE 0.9 % IV SOLN: 500.0000 mL | Freq: Once | INTRAVENOUS | Status: DC

## 2021-03-01 NOTE — Progress Notes (Signed)
Called to room to assist during endoscopic procedure.  Patient ID and intended procedure confirmed with present staff. Received instructions for my participation in the procedure from the performing physician.  

## 2021-03-01 NOTE — Patient Instructions (Signed)
Impression/Recommendations: ? ?Resume previous diet. ?Continue present medications. ?Await pathology results. ? ?YOU HAD AN ENDOSCOPIC PROCEDURE TODAY AT THE [facility]:   Refer to the procedure report that was given to you for any specific questions about what was found during the examination.  If the procedure report does not answer your questions, please call your gastroenterologist to clarify.  If you requested that your care partner not be given the details of your procedure findings, then the procedure report has been included in a sealed envelope for you to review at your convenience later. ? ?YOU SHOULD EXPECT: Some feelings of bloating in the abdomen. Passage of more gas than usual.  Walking can help get rid of the air that was put into your GI tract during the procedure and reduce the bloating. If you had a lower endoscopy (such as a colonoscopy or flexible sigmoidoscopy) you may notice spotting of blood in your stool or on the toilet paper. If you underwent a bowel prep for your procedure, you may not have a normal bowel movement for a few days. ? ?Please Note:  You might notice some irritation and congestion in your nose or some drainage.  This is from the oxygen used during your procedure.  There is no need for concern and it should clear up in a day or so. ? ?SYMPTOMS TO REPORT IMMEDIATELY: ? ?Following upper endoscopy (EGD) ? Vomiting of blood or coffee ground material ? New chest pain or pain under the shoulder blades ? Painful or persistently difficult swallowing ? New shortness of breath ? Fever of 100?F or higher ? Black, tarry-looking stools ? ?For urgent or emergent issues, a gastroenterologist can be reached at any hour by calling [phone]. ?Do not use MyChart messaging for urgent concerns.  ? ? ?DIET:  We do recommend a small meal at first, but then you may proceed to your regular diet.  Drink plenty of fluids but you should avoid alcoholic beverages for 24  hours. ? ?ACTIVITY:  You should plan to take it easy for the rest of today and you should NOT DRIVE or use heavy machinery until tomorrow (because of the sedation medicines used during the test).   ? ?FOLLOW UP: ?Our staff will call the number listed on your records 48-72 hours following your procedure to check on you and address any questions or concerns that you may have regarding the information given to you following your procedure. If we do not reach you, we will leave a message.  We will attempt to reach you two times.  During this call, we will ask if you have developed any symptoms of COVID 19. If you develop any symptoms (ie: fever, flu-like symptoms, shortness of breath, cough etc.) before then, please call [phone].  If you test positive for Covid 19 in the 2 weeks post procedure, please call and report this information to us.   ? ?If any biopsies were taken you will be contacted by phone or by letter within the next 1-3 weeks.  Please call us at [phone] if you have not heard about the biopsies in 3 weeks.  ? ? ?SIGNATURES/CONFIDENTIALITY: ?You and/or your care partner have signed paperwork which will be entered into your electronic medical record.  These signatures attest to the fact that that the information above on your After Visit Summary has been reviewed and is understood.  Full responsibility of the confidentiality of this discharge information lies with you and/or your care-partner.  ?

## 2021-03-01 NOTE — Telephone Encounter (Signed)
Please clarify incomplete note. Thank you.

## 2021-03-01 NOTE — Progress Notes (Signed)
Report given to PACU, vss 

## 2021-03-01 NOTE — Op Note (Signed)
[facility] Patient Name: Shane Saunders Procedure Date: 03/01/2021 8:28 AM MRN: [id number] Endoscopist: Sherilyn Cooter L. Myrtie Neither , MD Age: 50 Referring MD:  Date of Birth: November 18, 1971 Gender: Male Account #: [id number] Procedure:                Upper GI endoscopy Indications:              Pharyngeal phase dysphagia, Heartburn, Esophageal                            reflux symptoms that persist despite appropriate                            therapy Medicines:                Monitored Anesthesia Care Procedure:                Pre-Anesthesia Assessment:                           - Prior to the procedure, a History and Physical                            was performed, and patient medications and                            allergies were reviewed. The patient's tolerance of                            previous anesthesia was also reviewed. The risks                            and benefits of the procedure and the sedation                            options and risks were discussed with the patient.                            All questions were answered, and informed consent                            was obtained. Prior Anticoagulants: The patient has                            taken no previous anticoagulant or antiplatelet                            agents. ASA Grade Assessment: II - A patient with                            mild systemic disease. After reviewing the risks                            and benefits, the patient was deemed in  satisfactory condition to undergo the procedure.                           After obtaining informed consent, the endoscope was                            passed under direct vision. Throughout the                            procedure, the patient's blood pressure, pulse, and                            oxygen saturations were monitored continuously. The                            Endoscope was introduced through the mouth, and                             advanced to the second part of duodenum. The upper                            GI endoscopy was accomplished without difficulty.                            The patient tolerated the procedure fairly well. Scope In: Scope Out: Findings:                 The larynx was normal.                           Normal mucosa was found in the entire esophagus.                            Several biopsies were obtained in the upper third                            of the esophagus and in the lower third of the                            esophagus with cold forceps for histology.                           There is no endoscopic evidence of esophagitis,                            hiatal hernia or stricture in the entire esophagus.                           The stomach was normal.                           The cardia and gastric fundus were normal on  retroflexion.                           The examined duodenum was normal. Complications:            No immediate complications. Estimated Blood Loss:     Estimated blood loss was minimal. Impression:               - Normal larynx.                           - Normal mucosa was found in the entire esophagus.                           - Normal stomach.                           - Normal examined duodenum.                           - Several biopsies were obtained in the upper third                            of the esophagus and in the lower third of the                            esophagus.                           Dysphagia may be GERD-related or other dysmotility Recommendation:           - Patient has a contact number available for                            emergencies. The signs and symptoms of potential                            delayed complications were discussed with the                            patient. Return to normal activities tomorrow.                            Written discharge  instructions were provided to the                            patient.                           - Resume previous diet.                           - Continue present medications.                           - Await pathology results. If normal, schedule  barium swallow. Kem Parcher L. Myrtie Neither, MD 03/01/2021 8:58:44 AM This report has been signed electronically.

## 2021-03-01 NOTE — Progress Notes (Signed)
Pt. Reports having H/A upon awakening.  Bilateral temple H/A, and feeling somewhat dizzy.  Pt.'s wife states he has these headaches frequently, with accompanying dizziness.  They are planning to see a neurologist for these symptoms, prior to visit here today.

## 2021-03-01 NOTE — Progress Notes (Signed)
Medical history reviewed with no changes since PV. VS assessed by C.W 

## 2021-03-01 NOTE — Telephone Encounter (Signed)
Sorry, not sure what happened to that incomplete note. Yes I wanted to started him on Omeprazole 40 mg 30 minutes before breakfast, # 30 with 5 refills. Thanks

## 2021-03-01 NOTE — Progress Notes (Signed)
____________________________________________________________  Attending physician addendum:  Thank you for sending this case to me. I have reviewed the entire note and agree with the plan.  He is on my endoscopy schedule for today.  Sounds like GERD with stricture, possibly EoE.  Amada Jupiter, MD  ____________________________________________________________

## 2021-03-01 NOTE — Progress Notes (Signed)
Late entry- 0829 Robinul 0.1 mg IV given due large amount of secretions upon assessment.  MD made aware, vss

## 2021-03-01 NOTE — Telephone Encounter (Signed)
RX sent

## 2021-03-02 IMAGING — CT CT ANGIO NECK
1 of 11 series · 13 of 46 positions shown, 18 images · IV contrast (OMNI)
Comparison: Head MRI 11/17/2019

CLINICAL DATA: Headache, sore throat, tenderness to the top of the
head, neck spasms, and back pain.

EXAM:
CT ANGIOGRAPHY HEAD AND NECK
TECHNIQUE: Multidetector CT imaging of the head and neck was performed using
the standard protocol during bolus administration of intravenous
contrast. Multiplanar CT image reconstructions and MIPs were
obtained to evaluate the vascular anatomy. Carotid stenosis
measurements (when applicable) are obtained utilizing NASCET
criteria, using the distal internal carotid diameter as the
denominator.
CONTRAST:  75mL OMNIPAQUE IOHEXOL 350 MG/ML SOLN

[Series 17: thin · axial · 0.49mm/px · z∈[-276,+36]mm · 13 of 714 slices shown, 18 images]
[im 45/714  soft-tissue]
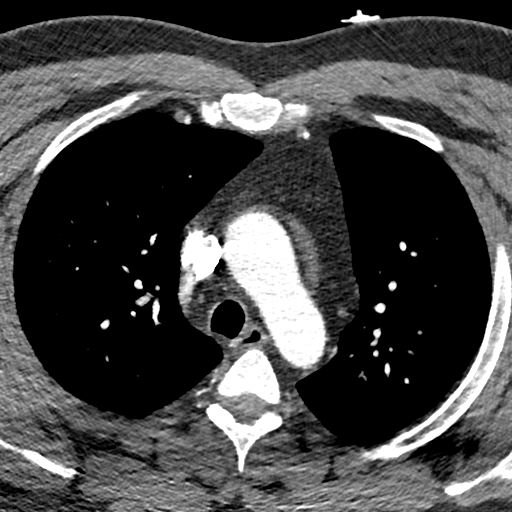
[im 45/714  bone]
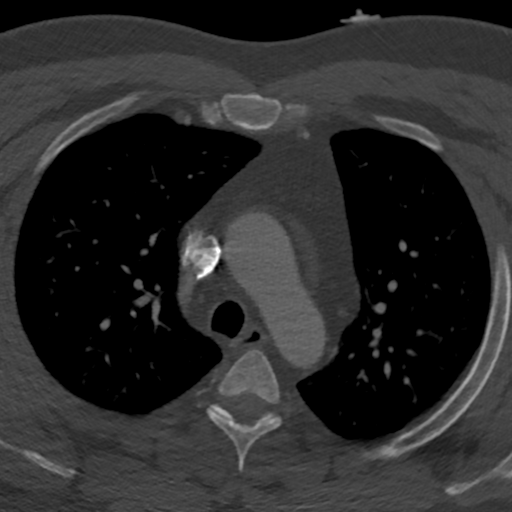
[im 90/714  soft-tissue]
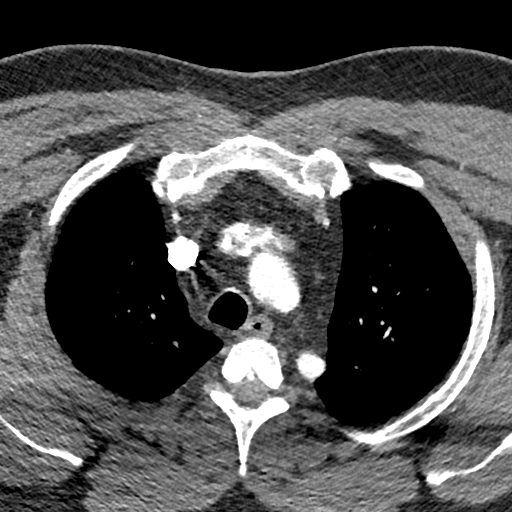
[im 179/714  soft-tissue]
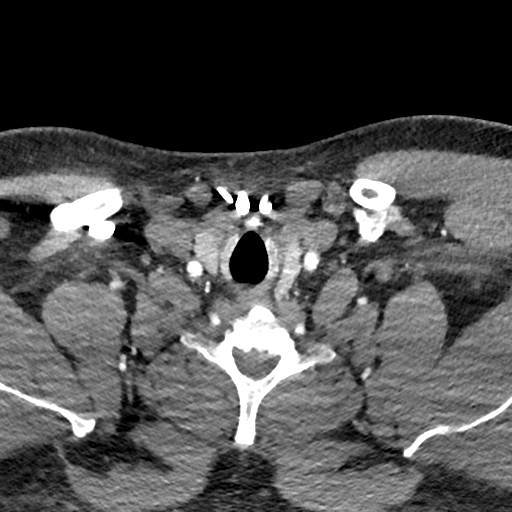
[im 223/714  soft-tissue]
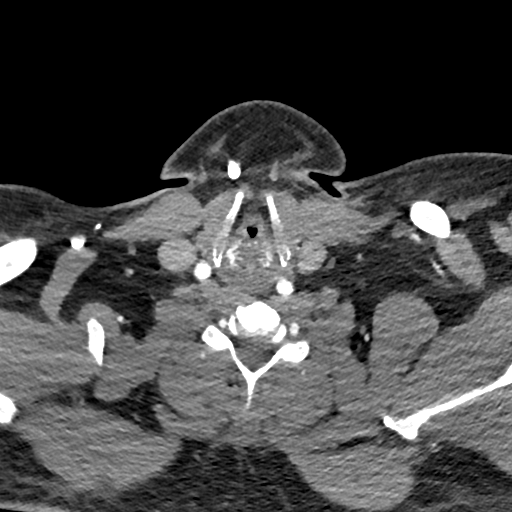
[im 268/714  soft-tissue]
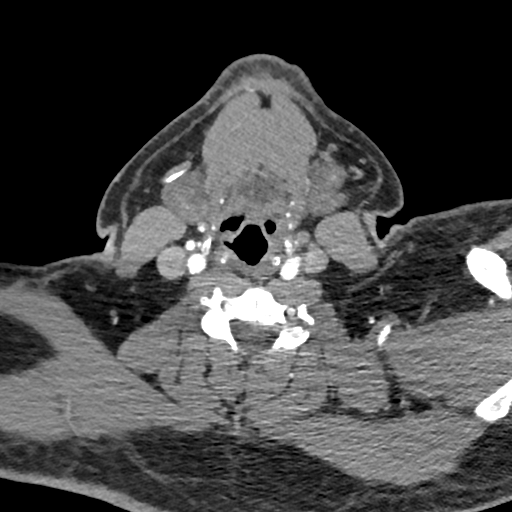
[im 312/714  soft-tissue]
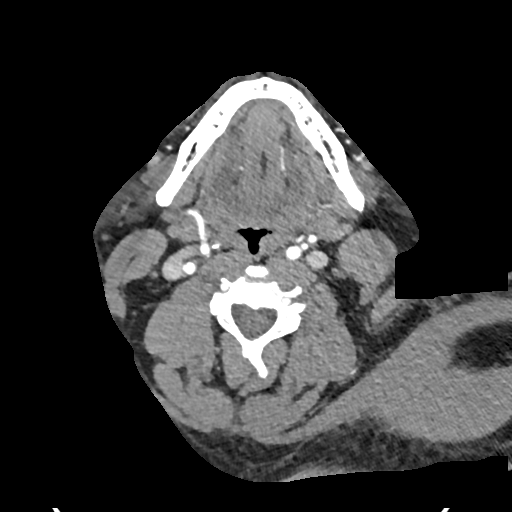
[im 402/714  soft-tissue]
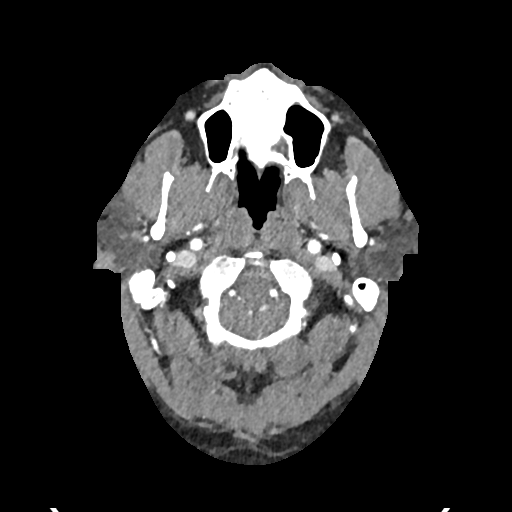
[im 446/714  soft-tissue]
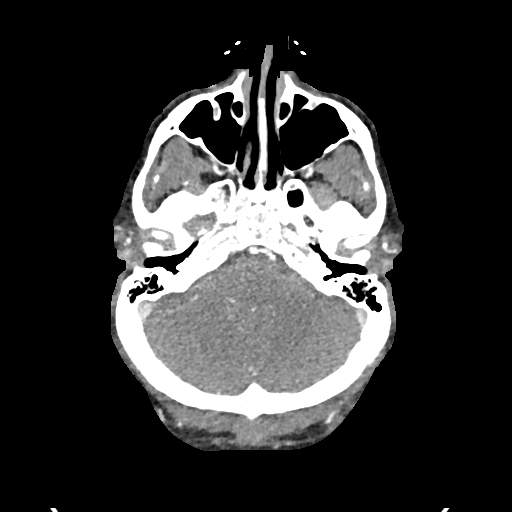
[im 491/714  soft-tissue]
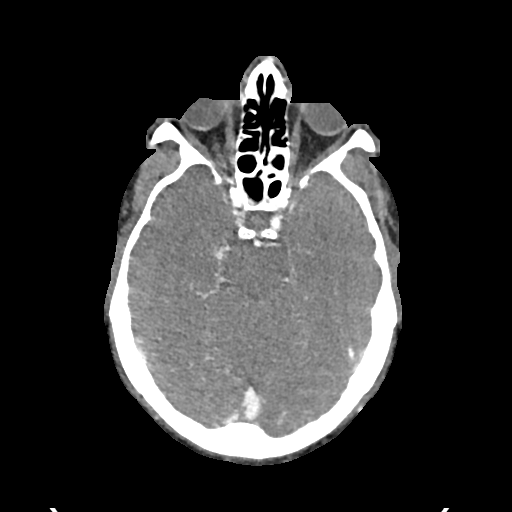
[im 491/714  bone]
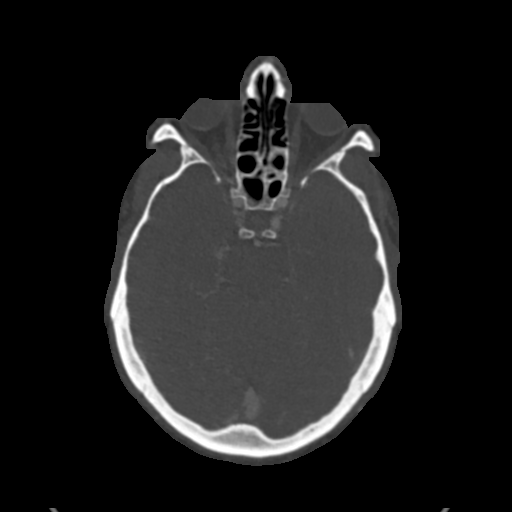
[im 535/714  soft-tissue]
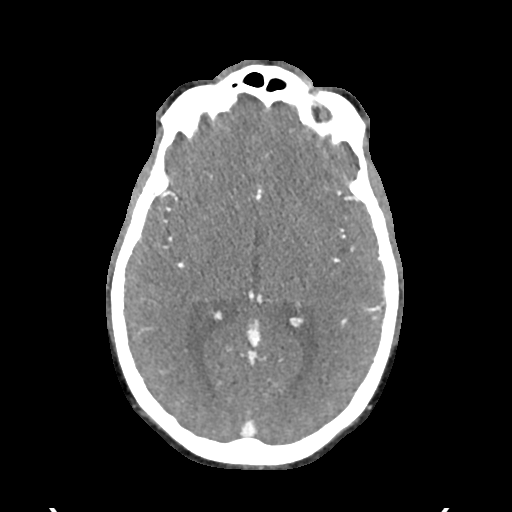
[im 535/714  lung]
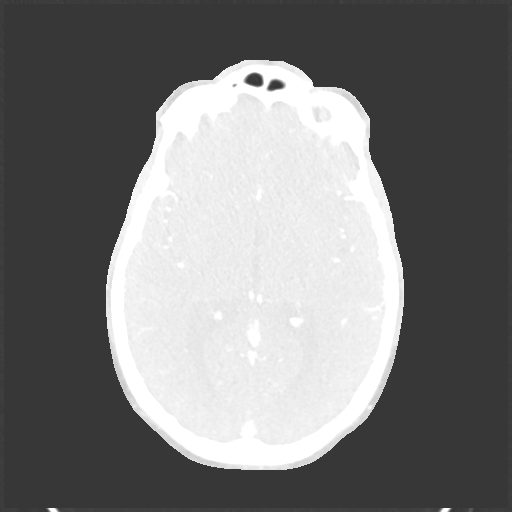
[im 580/714  lung]
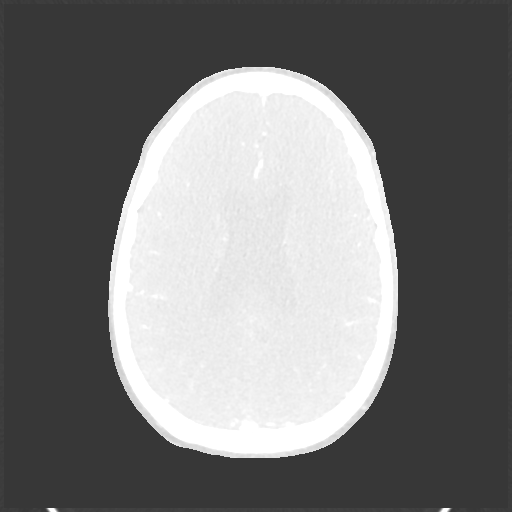
[im 624/714  soft-tissue]
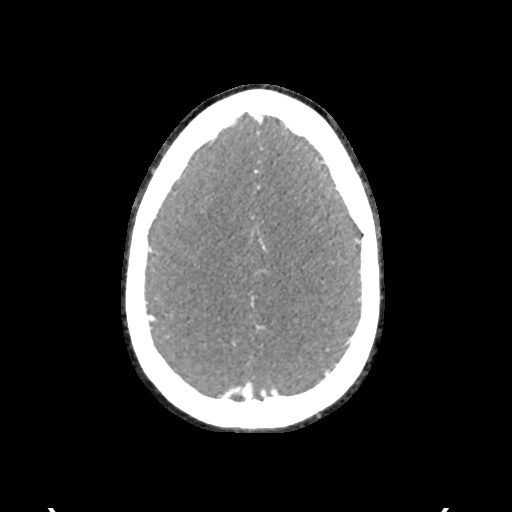
[im 624/714  lung]
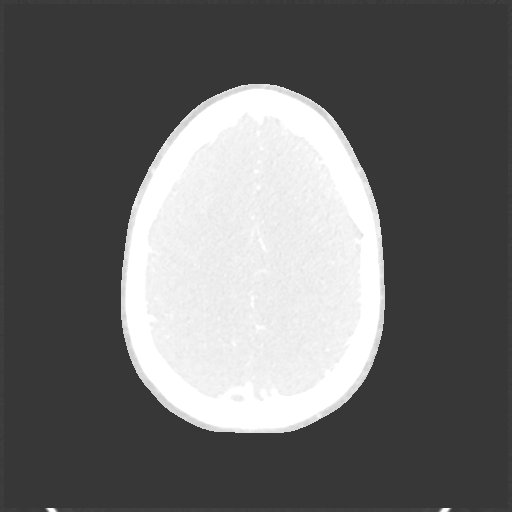
[im 669/714  soft-tissue]
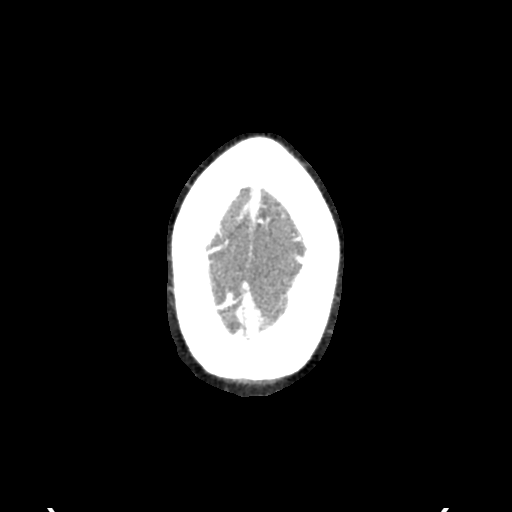
[im 669/714  lung]
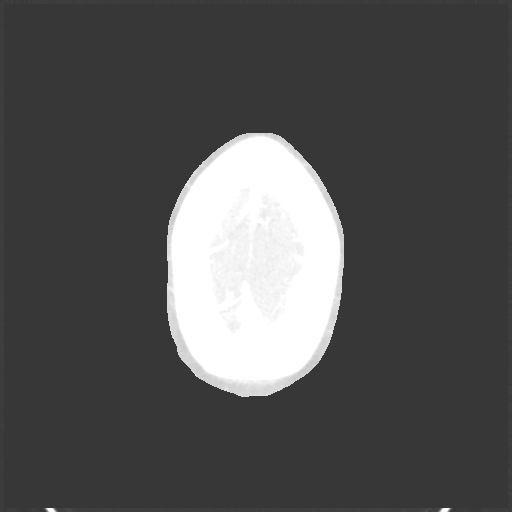

[13 of 46 positions shown; findings below may reference images not displayed]

FINDINGS: CT HEAD FINDINGS

Brain: There is no evidence of acute infarct, intracranial
hemorrhage, mass, midline shift, or extra-axial fluid collection.
The lateral ventricles are borderline prominent in size without
temporal horn dilatation, unchanged. The third and fourth ventricles
are normal in size.

Vascular: No hyperdense vessel.

Skull: No fracture or suspicious osseous lesion.

Sinuses: Paranasal sinuses and mastoid air cells are clear.

Orbits: Unremarkable.

Review of the MIP images confirms the above findings

CTA NECK FINDINGS

Aortic arch: Normal variant aortic arch branching pattern with
common origin of the brachiocephalic and left common carotid
arteries. Widely patent arch vessel origins.

Right carotid system: Patent with small volume calcified plaque in
the carotid bulb. No evidence of stenosis or dissection.

Left carotid system: Patent without evidence of stenosis or
dissection.

Vertebral arteries: Patent without evidence of stenosis or
dissection. Mildly dominant left vertebral artery.

Skeleton: No acute osseous abnormality or suspicious osseous lesion.

Other neck: No evidence of cervical lymphadenopathy or mass.

Upper chest: Clear lung apices.

Review of the MIP images confirms the above findings

CTA HEAD FINDINGS

Anterior circulation: The internal carotid arteries are widely
patent from skull base to carotid termini. ACAs and MCAs are patent
without evidence of proximal branch occlusion or significant
proximal stenosis. No aneurysm is identified.

Posterior circulation: The intracranial vertebral arteries are
widely patent to the basilar. Patent PICAs and SCAs are seen
bilaterally. The basilar artery is widely patent. There are left
larger than right posterior communicating arteries with hypoplasia
of the left P1 segment. No significant proximal PCA stenosis is
seen. No aneurysm is identified.

Venous sinuses: Patent.

Anatomic variants: Fetal left PCA.

Review of the MIP images confirms the above findings
IMPRESSION: Negative head and neck CTA.

## 2021-03-05 ENCOUNTER — Telehealth: Payer: Self-pay

## 2021-03-05 NOTE — Telephone Encounter (Signed)
First post procedure follow up call, no answer 

## 2021-03-05 NOTE — Telephone Encounter (Signed)
  Follow up Call-  Call back number 03/01/2021  Post procedure Call Back phone  # [phone]  Permission to leave phone message Yes  Some recent data might be hidden     Patient questions:  Do you have a fever, pain , or abdominal swelling? No. Pain Score  0 *  Have you tolerated food without any problems? Yes.    Have you been able to return to your normal activities? Yes.    Do you have any questions about your discharge instructions: Diet   No. Medications  No. Follow up visit  No.  Do you have questions or concerns about your Care? No.   States still having sx he was having pre-procedure but he is doing ok and waiting for results.  Actions: * If pain score is 4 or above: No action needed, pain <4.  1. Have you developed a fever since your procedure? no  2.   Have you had an respiratory symptoms (SOB or cough) since your procedure? no  3.   Have you tested positive for COVID 19 since your procedure no  4.   Have you had any family members/close contacts diagnosed with the COVID 19 since your procedure?  no   If yes to any of these questions please route to Laverna Peace, RN and Karlton Lemon, RN

## 2021-03-07 ENCOUNTER — Other Ambulatory Visit: Payer: Self-pay | Admitting: Internal Medicine

## 2021-03-07 DIAGNOSIS — F419 Anxiety disorder, unspecified: Secondary | ICD-10-CM

## 2021-03-08 ENCOUNTER — Other Ambulatory Visit: Payer: Self-pay

## 2021-03-08 DIAGNOSIS — R1314 Dysphagia, pharyngoesophageal phase: Secondary | ICD-10-CM

## 2021-03-08 DIAGNOSIS — K219 Gastro-esophageal reflux disease without esophagitis: Secondary | ICD-10-CM

## 2021-03-08 DIAGNOSIS — G9349 Other encephalopathy: Secondary | ICD-10-CM | POA: Insufficient documentation

## 2021-03-08 DIAGNOSIS — G43009 Migraine without aura, not intractable, without status migrainosus: Secondary | ICD-10-CM | POA: Insufficient documentation

## 2021-03-13 ENCOUNTER — Ambulatory Visit (HOSPITAL_COMMUNITY)
Admission: RE | Admit: 2021-03-13 | Discharge: 2021-03-13 | Disposition: A | Discharge status: Home / Self Care | Payer: BC Managed Care – PPO | Source: Ambulatory Visit | Attending: Gastroenterology | Admitting: Gastroenterology

## 2021-03-13 ENCOUNTER — Other Ambulatory Visit: Payer: Self-pay

## 2021-03-13 DIAGNOSIS — K219 Gastro-esophageal reflux disease without esophagitis: Secondary | ICD-10-CM

## 2021-03-13 DIAGNOSIS — R1314 Dysphagia, pharyngoesophageal phase: Secondary | ICD-10-CM | POA: Insufficient documentation

## 2021-03-13 DIAGNOSIS — G5620 Lesion of ulnar nerve, unspecified upper limb: Secondary | ICD-10-CM | POA: Insufficient documentation

## 2021-03-13 DIAGNOSIS — M25521 Pain in right elbow: Secondary | ICD-10-CM | POA: Insufficient documentation

## 2021-03-14 DIAGNOSIS — R200 Anesthesia of skin: Secondary | ICD-10-CM | POA: Insufficient documentation

## 2021-03-14 DIAGNOSIS — R2689 Other abnormalities of gait and mobility: Secondary | ICD-10-CM | POA: Insufficient documentation

## 2021-04-06 ENCOUNTER — Other Ambulatory Visit: Payer: Self-pay | Admitting: Internal Medicine

## 2021-04-06 DIAGNOSIS — R2681 Unsteadiness on feet: Secondary | ICD-10-CM

## 2021-04-22 ENCOUNTER — Other Ambulatory Visit: Payer: Self-pay | Admitting: Internal Medicine

## 2021-04-22 DIAGNOSIS — Z0271 Encounter for disability determination: Secondary | ICD-10-CM

## 2021-04-22 DIAGNOSIS — R2681 Unsteadiness on feet: Secondary | ICD-10-CM

## 2021-04-24 ENCOUNTER — Ambulatory Visit: Payer: BC Managed Care – PPO | Admitting: Gastroenterology

## 2021-04-24 ENCOUNTER — Encounter: Payer: Self-pay | Admitting: Gastroenterology

## 2021-04-24 VITALS — BP 130/90 | HR 72 | Ht 70.0 in | Wt 265.0 lb

## 2021-04-24 DIAGNOSIS — R1314 Dysphagia, pharyngoesophageal phase: Secondary | ICD-10-CM

## 2021-04-24 NOTE — Progress Notes (Signed)
[facility] Progress Note  Chief Complaint: Dysphagia.  Subjective  History: Jeremyah was seen in clinic on March 3 with esophageal dysphagia and heartburn in the context of other systemic symptoms for which she had had extensive evaluation and was planning to see a neurologist or other specialist at an academic center soon afterward.  He underwent upper endoscopy March 4, which was a grossly normal study.  Distal and mid esophageal biopsies were normal, specifically showing no signs of reflux or EOE.  Subsequent barium swallow exam on 03/13/2021 was also normal.  Neurology consultation by Dr. Melrose Nakayama at [facility] on March 11 review some test results and did not have a specific diagnosis to explain his neurologic and other systemic symptoms.   Elester feels about the same, and describes his dysphagia as as if his chewing and swallowing muscles are "weak".  He says it is similar to how his arm muscles might fatigue if he swings a hammer too long.  Food does not feel hung up or stuck in the chest, and he denies odynophagia.  He rarely gets heartburn.  He still describes "waves" on his abdominal wall as if there are muscle contractions and things are "rolling" ROS: Cardiovascular:  no chest pain Respiratory: no dyspnea  The patient's Past Medical, Family and Social History were reviewed and are on file in the EMR.  Objective:  Med list reviewed  Current Outpatient Medications:  .  atorvastatin (LIPITOR) 80 MG tablet, Take 1 tablet daily for cholesterol, Disp: 90 tablet, Rfl: 1 .  azelastine (ASTELIN) 0.1 % nasal spray, , Disp: , Rfl:  .  buPROPion (WELLBUTRIN XL) 300 MG 24 hr tablet, Take 1 tablet every Morning for Mood, Focus & Concentration, Disp: 90 tablet, Rfl: 3 .  Cholecalciferol (VITAMIN D3) 250 MCG (10000 UT) TABS, Take 10,000 Units by mouth daily., Disp: , Rfl:  .  escitalopram (LEXAPRO) 20 MG tablet, Take 1 tablet Daily for Mood, Disp: 90 tablet, Rfl: 3 .   fexofenadine (ALLEGRA) 60 MG tablet, Takes 1 tablet 2 x /day as needed for Allergies, Disp: , Rfl:  .  omeprazole (PRILOSEC) 40 MG capsule, Take 1 capsule (40 mg total) by mouth daily before breakfast., Disp: 30 capsule, Rfl: 5   Vital signs in last 24 hrs: Vitals:   04/24/21 1320  BP: 130/90  Pulse: 72  SpO2: 94%   Wt Readings from Last 3 Encounters:  04/24/21 265 lb (120.2 kg)  03/01/21 262 lb (118.8 kg)  02/28/21 262 lb (118.8 kg)    Physical Exam  Ambulatory, gets on exam table without difficulty.  Normal vocal quality  HEENT: sclera anicteric, oral mucosa moist without lesions  Neck: supple, no thyromegaly, JVD or lymphadenopathy  Cardiac: RRR without murmurs, S1S2 heard, no peripheral edema  Pulm: clear to auscultation bilaterally, normal RR and effort noted  Abdomen: soft, no tenderness, with active bowel sounds. No guarding or palpable hepatosplenomegaly.  Skin; warm and dry, no jaundice or rash  Labs:   ___________________________________________ Radiologic studies:  CLINICAL DATA:  Dysphagia. Solid food getting stuck for 3 months. Gastroesophageal reflux disease.   EXAM: ESOPHOGRAM / BARIUM SWALLOW / BARIUM TABLET STUDY   TECHNIQUE: Combined double contrast and single contrast examination performed using effervescent crystals, thick barium liquid, and thin barium liquid. The patient was observed with fluoroscopy swallowing a 13 mm barium sulphate tablet.   FLUOROSCOPY TIME:  Fluoroscopy Time:  2 minutes and 12 seconds   Radiation Exposure Index (if provided  by the fluoroscopic device): 40.7 mGy   Number of Acquired Spot Images: 0   COMPARISON:  Endoscopy of 03/01/2021   FINDINGS: Hypopharyngeal portion of the exam is unremarkable.   Double contrast evaluation of the esophagus demonstrates no mucosal abnormality.   Evaluation of primary peristalsis demonstrates a normal primary peristaltic wave on each swallow.   Full column evaluation of  the esophagus demonstrates no persistent narrowing or stricture.   A 13 mm barium tablet passes promptly.   IMPRESSION: Normal esophagram. No evidence of esophageal dysmotility or explanation for patient's symptoms.     Electronically Signed   By: Jeronimo Greaves M.D.   On: 03/13/2021 12:08  ____________________________________________ Other:   _____________________________________________ Assessment & Plan  Assessment: Encounter Diagnosis  Name Primary?  . Pharyngoesophageal dysphagia Yes   This dysphagia has been difficult to characterize, it sounds pharyngeal esophageal, but he has no evidence of aspiration on imaging study.  He seems to have a systemic (?  Neurologic) condition with disparate symptoms that have thus far not had a unifying diagnosis.  I believe these digestive symptoms seem a manifestation of that systemic condition.  Fortunately, no structural abnormalities on EGD, and no obvious motility problems on barium swallow.  He does not appear at risk for aspiration.  As there has been no evidence of GERD, his PPI can be stopped.  I do not think that additional GI testing such as manometry will be revealing for this, and I do not have any other particular treatments to offer at this time.  I will be glad to see him as needed should new problems arise  22 minutes were spent on this encounter (including chart review, history/exam, counseling/coordination of care, and documentation) > 50% of that time was spent on counseling and coordination of care.  Topics discussed included: His symptoms and results of testing and my impressions.Marland Kitchen  Charlie Pitter III

## 2021-04-24 NOTE — Patient Instructions (Signed)
If you are age 51 or older, your body mass index should be between 23-30. Your Body mass index is 38.02 kg/m. If this is out of the aforementioned range listed, please consider follow up with your Primary Care Provider.  If you are age 41 or younger, your body mass index should be between 19-25. Your Body mass index is 38.02 kg/m. If this is out of the aformentioned range listed, please consider follow up with your Primary Care Provider.   Follow up as needed.  It was a pleasure to see you today!  Dr. Myrtie Neither

## 2021-05-07 ENCOUNTER — Encounter: Payer: Self-pay | Admitting: Internal Medicine

## 2021-05-07 NOTE — Progress Notes (Signed)
Annual  Screening/Preventative Visit  & Comprehensive Evaluation & Examination  Future Appointments  Date Time Provider [place]  05/08/2021  2:00 PM Unk Pinto, MD [facility] None            This very nice 50 y.o.  MWM  presents for a Screening /Preventative Visit & comprehensive evaluation and management of multiple medical co-morbidities.  Patient has been followed for HTN, HLD, Prediabetes, Depression and Vitamin D Deficiency. Patient has been dx'd by Dr Dohmeier with moderately severe OSA and has been recommended CPAP.        Since Nov 2020 , patient has had on-going myriad of complaints  Including HA, dizziness , unstable gait, chest pains,, c/o asymmetric facial swelling, chronic fatigue and even self dx'd himself with MS . He has to date been evaluated by at least 62 medical providers in disciplines of Internal Medicine, Cardiology, Hematology, Allergy,  Neurology, ENT, Dermatology, MD's & APP's at [facility], [place], [place].  He's had numerous negative blood tests & procedures.       HTN predates since 2010. Patient's BP has been controlled at home.  Today's  . Patient denies any cardiac symptoms as chest pain, palpitations, shortness of breath, dizziness or ankle swelling.       Patient's hyperlipidemia is not controlled with diet and Atorvastatin. Patient denies myalgias or other medication SE's. Last lipids were not at goal:  Lab Results  Component Value Date   CHOL 184 05/07/2020   HDL 34 (L) 05/07/2020   LDLCALC 116 (H) 05/07/2020   TRIG 217 (H) 05/07/2020   CHOLHDL 5.4 (H) 05/07/2020         Patient has hx/o Morbid Obesity (BMI 37+)& consequent  prediabetes (A1c 5.8% /2016). Patient denies reactive hypoglycemic symptoms, visual blurring, diabetic polys or paresthesias. Last A1c was normal & at goal:   Lab Results  Component Value Date   HGBA1C 5.1 05/07/2020         Finally, patient has history of Vitamin D Deficiency ("33" /2008  & "29"  /2016) and last vitamin D was at goal:   Lab Results  Component Value Date   VD25OH 84 05/07/2020    Current Outpatient Medications on File Prior to Visit  Medication Sig  . atorvastatin   80 MG tablet Take 1 tablet daily for cholesterol  . ASTELIN 0.1 % nasal spray   . buPROPion-XL 300 MG  Take 1 tablet every Morning for Mood, Focus & Concentration  . VITAMIN D$RemoveBe' \\10'UaLuxqkNP$ ,000 u Take 10,000 Units  daily.  Marland Kitchen escitalopram  20 MG tablet Take 1 tablet Daily for Mood  . fexofenadine 60 MG tablet Takes 1 tablet 2 x /day as needed for Allergies  . omeprazole  40 MG capsule Take 1 capsuledaily before breakfast.    Allergies  Allergen Reactions  . Codeine Other (See Comments)    "I cannot sleep"  . Effexor [Venlafaxine] Other (See Comments)    "E.D."  . Prednisone Other (See Comments)    "I cannot sleep"  . Tramadol Hcl Itching  . Zinc Nausea Only and Other (See Comments)    "Upsets my stomach"    Past Medical History:  Diagnosis Date  . Abnormal glucose   . Borderline hypertension   . BPH (benign prostatic hypertrophy)   . Crushing injury of left elbow and forearm 07/13/2015  . 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 Dizziness   . Hyperlipidemia   . Hypertension   . Hypogonadism male   .  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 Morbid obesity ([place])   . Obesity (BMI 30-39.9)   . OSA on CPAP   . Prediabetes   . Right ureteral stone   . Sleep apnea    C PAP  . Vertigo   . Vitamin D deficiency   . Wears glasses     Health Maintenance  Topic Date Due  . INFLUENZA VACCINE  07/29/2021  . TETANUS/TDAP  05/31/2024  . COLONOSCOPY (Pts 45-29yrs Insurance coverage will need to be confirmed)  09/25/2026  . COVID-19 Vaccine  Completed  . Hepatitis C Screening  Completed  . HIV Screening  Completed  . HPV VACCINES  Aged Out    Immunization History  Administered Date(s) Administered  . Influenza Inj Mdck Quad   10/12/2018, 11/01/2019,  12/03/2020  . Influenza-Unspecified 10/20/2014  . PFIZER  SARS-COV-2 Vacc 04/06/2020, 04/30/2020, 10/12/2020  . PPD Test 01/16/2015, 02/18/2017, 03/23/2018, 04/25/2019, 05/07/2020  . Pneumococcal Conjugate-13 10/27/2006  . Pneumococcal-23 10/27/2006  . Tdap 08/05/2013, 05/31/2014    Last Colon - 09/25/2016 - Dr Loletha Carrow -Normal - Recc 10 year f/u.   Past Surgical History:  Procedure Laterality Date  . 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 ARTERY REPAIR Right 05/31/2014   Procedure: IRRIGATION, EXPLORATION, AND REPAIR OF RIGHT ARM WOUND;  Surgeon: Gwenyth Ober, MD;  Location: [facility];  Service: General;  Laterality: Right;  . COLONOSCOPY    . CYSTOSCOPY WITH RETROGRADE PYELOGRAM, URETEROSCOPY AND STENT PLACEMENT Right 07/11/2016   Procedure: CYSTOSCOPY WITH RETROGRADE PYELOGRAM, URETEROSCOPY, BASKET STONE EXTRACTION  AND STENT PLACEMENT;  Surgeon: Alexis Frock, MD;  Location[facility];  Service: Urology;  Laterality: Right;  45 MINS  C-ARM DIGITAL URETEROSCOPE HOLMIUM LASER  . ESOPHAGOGASTRODUODENOSCOPY       Family History  Problem Relation Age of Onset  . Hypertension Mother   . Migraines Mother   . Thyroid disease Mother   . Hypertension Father   . Hyperlipidemia Father   . Diabetes Father   . Cancer Sister        thyroid  . CAD Paternal Grandfather   . Prostate cancer Paternal Uncle   . Colon cancer Neg Hx   . Rectal cancer Neg Hx   . Stomach cancer Neg Hx     Social History   Socioeconomic History  . Marital status: Married    Spouse name: Leafy Ro  . Number of children: 2  . Years of education: 12  Occupational History      Tobacco Use  . Smoking status: Former Smoker    Packs/day: 0.50    Years: 10.00    Pack years: 5.00    Types: Cigarettes    Quit date: 10/27/2009    Years since quitting: 11.5  . Smokeless tobacco: Former Systems developer    Types: Snuff  Vaping Use  .  Vaping Use: Never used  Substance and Sexual Activity  . Alcohol use: Not Currently    Comment: occasional  . Drug use: No  . Sexual activity: Yes    Comment: Vasectomy    ROS Constitutional: Denies fever, chills, weight loss/gain, headaches, insomnia,  night sweats or change in appetite. Does c/o fatigue. Eyes: Denies redness, blurred vision, diplopia, discharge, itchy or watery eyes.  ENT: Denies discharge, congestion, post nasal drip, epistaxis, sore throat, earache, hearing loss, dental pain, Tinnitus, Vertigo, Sinus pain or  snoring.  Cardio: Denies chest pain, palpitations, irregular heartbeat, syncope, dyspnea, diaphoresis, orthopnea, PND, claudication or edema Respiratory: denies cough, dyspnea, DOE, pleurisy, hoarseness, laryngitis or wheezing.  Gastrointestinal: Denies dysphagia, heartburn, reflux, water brash, pain, cramps, nausea, vomiting, bloating, diarrhea, constipation, hematemesis, melena, hematochezia, jaundice or hemorrhoids Genitourinary: Denies dysuria, frequency, urgency, nocturia, hesitancy, discharge, hematuria or flank pain Musculoskeletal: Denies arthralgia, myalgia, stiffness, Jt. Swelling, pain, limp or strain/sprain. Denies Falls. Skin: Denies puritis, rash, hives, warts, acne, eczema or change in skin lesion Neuro: No weakness, tremor, incoordination, spasms, paresthesia or pain Psychiatric: Denies confusion, memory loss or sensory loss. Denies Depression. Endocrine: Denies change in weight, skin, hair change, nocturia, and paresthesia, diabetic polys, visual blurring or hyper / hypo glycemic episodes.  Heme/Lymph: No excessive bleeding, bruising or enlarged lymph nodes.   Physical Exam  There were no vitals taken for this visit.  General Appearance: Well nourished and well groomed and in no apparent distress.  Eyes: PERRLA, EOMs, conjunctiva no swelling or erythema, normal fundi and vessels. Sinuses: No frontal/maxillary tenderness ENT/Mouth: EACs patent  / TMs  nl. Nares clear without erythema, swelling, mucoid exudates. Oral hygiene is good. No erythema, swelling, or exudate. Tongue normal, non-obstructing. Tonsils not swollen or erythematous. Hearing normal.  Neck: Supple, thyroid not palpable. No bruits, nodes or JVD. Respiratory: Respiratory effort normal.  BS equal and clear bilateral without rales, rhonci, wheezing or stridor. Cardio: Heart sounds are normal with regular rate and rhythm and no murmurs, rubs or gallops. Peripheral pulses are normal and equal bilaterally without edema. No aortic or femoral bruits. Chest: symmetric with normal excursions and percussion.  Abdomen: Soft, with Nl bowel sounds. Nontender, no guarding, rebound, hernias, masses, or organomegaly.  Lymphatics: Non tender without lymphadenopathy.  Musculoskeletal: Full ROM all peripheral extremities, joint stability, 5/5 strength, and normal gait. Skin: Warm and dry without rashes, lesions, cyanosis, clubbing or  ecchymosis.  Neuro: Cranial nerves intact, reflexes equal bilaterally. Normal muscle tone, no cerebellar symptoms. Sensation intact.  Pysch: Alert and oriented X 3 with normal affect, insight and judgment appropriate.   Assessment and Plan  1. Annual Preventative/Screening Exam    2. Labile hypertension  - EKG 12-Lead - [place], RETROPERITNL ABD,  LTD - Urinalysis, Routine w reflex microscopic - Microalbumin / creatinine urine ratio - COMPLETE METABOLIC PANEL WITH GFR - Magnesium - TSH  3. Hyperlipidemia, mixed  - EKG 12-Lead - [place], RETROPERITNL ABD,  LTD - Lipid panel - TSH  4. Abnormal glucose  - EKG 12-Lead - [place], RETROPERITNL ABD,  LTD - Hemoglobin A1c - Insulin, random  5. Vitamin D deficiency  - VITAMIN D 25 Hydroxy   6. Unstable gait   7. OSA on CPAP  - Vitamin B12  8. Cognitive dysfunction  - Vitamin B12  9. Multiple neurological symptoms  - Vitamin B12 - QuantiFERON-TB Gold Plus - B. burgdorfi antibodies  10.  Dizziness of unknown etiology   11. BPH with obstruction/lower urinary tract symptoms  - PSA  12. Anxiety tension state  - CBC with Differential/Platelet - TSH  13. Screening examination for infectious disease  - QuantiFERON-TB Gold Plus - B. burgdorfi antibodies  14. Screening for colorectal cancer  - POC Hemoccult Bld/Stl   15. Morbid obesity (HCC)  - TSH  16. Family history of ischemic heart disease  - EKG 12-Lead  17. Former smoker  - EKG 12-Lead - [place], RETROPERITNL ABD,  LTD  18. Screening for AAA (aortic abdominal aneurysm)  - [place], RETROPERITNL ABD,  LTD  19. Fatigue, unspecified type  - Iron,Total/Total Iron Binding Cap - Vitamin B12 - Testosterone - CBC with Differential/Platelet - TSH  20. Medication management  - Urinalysis, Routine w reflex microscopic - Microalbumin / creatinine urine ratio - CBC with Differential/Platelet - COMPLETE METABOLIC PANEL WITH GFR - Magnesium - Lipid panel - TSH - Hemoglobin A1c - Insulin, random - VITAMIN D 25 Hydroxy - PSA          Patient was counseled in prudent diet, weight control to achieve/maintain BMI less than 25, BP monitoring, regular exercise and medications as discussed.  Discussed med effects and SE's. Routine screening labs and tests as requested with regular follow-up as recommended. Over 40 minutes of exam, counseling, chart review and high complex critical decision making was performed   Kirtland Bouchard, MD

## 2021-05-07 NOTE — Patient Instructions (Signed)

## 2021-05-08 ENCOUNTER — Other Ambulatory Visit: Payer: Self-pay

## 2021-05-08 ENCOUNTER — Ambulatory Visit (INDEPENDENT_AMBULATORY_CARE_PROVIDER_SITE_OTHER): Payer: BC Managed Care – PPO | Admitting: Internal Medicine

## 2021-05-08 VITALS — BP 124/82 | HR 69 | Temp 97.2°F | Resp 16 | Ht 70.0 in | Wt 268.2 lb

## 2021-05-08 DIAGNOSIS — Z8249 Family history of ischemic heart disease and other diseases of the circulatory system: Secondary | ICD-10-CM

## 2021-05-08 DIAGNOSIS — R2990 Unspecified symptoms and signs involving the nervous system: Secondary | ICD-10-CM

## 2021-05-08 DIAGNOSIS — Z0000 Encounter for general adult medical examination without abnormal findings: Secondary | ICD-10-CM | POA: Diagnosis not present

## 2021-05-08 DIAGNOSIS — N401 Enlarged prostate with lower urinary tract symptoms: Secondary | ICD-10-CM | POA: Diagnosis not present

## 2021-05-08 DIAGNOSIS — I10 Essential (primary) hypertension: Secondary | ICD-10-CM

## 2021-05-08 DIAGNOSIS — Z131 Encounter for screening for diabetes mellitus: Secondary | ICD-10-CM

## 2021-05-08 DIAGNOSIS — Z136 Encounter for screening for cardiovascular disorders: Secondary | ICD-10-CM

## 2021-05-08 DIAGNOSIS — R350 Frequency of micturition: Secondary | ICD-10-CM | POA: Diagnosis not present

## 2021-05-08 DIAGNOSIS — Z13220 Encounter for screening for lipoid disorders: Secondary | ICD-10-CM | POA: Diagnosis not present

## 2021-05-08 DIAGNOSIS — Z1329 Encounter for screening for other suspected endocrine disorder: Secondary | ICD-10-CM | POA: Diagnosis not present

## 2021-05-08 DIAGNOSIS — Z87891 Personal history of nicotine dependence: Secondary | ICD-10-CM

## 2021-05-08 DIAGNOSIS — Z130 Encounter for screening for diseases of the blood and blood-forming organs and certain disorders involving the immune mechanism: Secondary | ICD-10-CM

## 2021-05-08 DIAGNOSIS — R5383 Other fatigue: Secondary | ICD-10-CM

## 2021-05-08 DIAGNOSIS — R42 Dizziness and giddiness: Secondary | ICD-10-CM

## 2021-05-08 DIAGNOSIS — Z0001 Encounter for general adult medical examination with abnormal findings: Secondary | ICD-10-CM

## 2021-05-08 DIAGNOSIS — E782 Mixed hyperlipidemia: Secondary | ICD-10-CM

## 2021-05-08 DIAGNOSIS — Z79899 Other long term (current) drug therapy: Secondary | ICD-10-CM | POA: Diagnosis not present

## 2021-05-08 DIAGNOSIS — F09 Unspecified mental disorder due to known physiological condition: Secondary | ICD-10-CM

## 2021-05-08 DIAGNOSIS — Z1389 Encounter for screening for other disorder: Secondary | ICD-10-CM

## 2021-05-08 DIAGNOSIS — Z1211 Encounter for screening for malignant neoplasm of colon: Secondary | ICD-10-CM

## 2021-05-08 DIAGNOSIS — G4733 Obstructive sleep apnea (adult) (pediatric): Secondary | ICD-10-CM

## 2021-05-08 DIAGNOSIS — F419 Anxiety disorder, unspecified: Secondary | ICD-10-CM

## 2021-05-08 DIAGNOSIS — N138 Other obstructive and reflux uropathy: Secondary | ICD-10-CM

## 2021-05-08 DIAGNOSIS — R0989 Other specified symptoms and signs involving the circulatory and respiratory systems: Secondary | ICD-10-CM | POA: Diagnosis not present

## 2021-05-08 DIAGNOSIS — Z125 Encounter for screening for malignant neoplasm of prostate: Secondary | ICD-10-CM

## 2021-05-08 DIAGNOSIS — R7309 Other abnormal glucose: Secondary | ICD-10-CM

## 2021-05-08 DIAGNOSIS — E559 Vitamin D deficiency, unspecified: Secondary | ICD-10-CM | POA: Diagnosis not present

## 2021-05-08 DIAGNOSIS — Z119 Encounter for screening for infectious and parasitic diseases, unspecified: Secondary | ICD-10-CM

## 2021-05-08 DIAGNOSIS — R2681 Unsteadiness on feet: Secondary | ICD-10-CM

## 2021-05-08 NOTE — Progress Notes (Signed)
AortaScan < 3 cm. Within normal limits, per Dr Mckeown. 

## 2021-05-09 NOTE — Progress Notes (Signed)
============================================================ -   Test results slightly outside the reference range are not unusual. If there is anything important, I will review this with you,  otherwise it is considered normal test values.  If you have further questions,  please do not hesitate to contact me at the office or via My Chart.  ============================================================ ============================================================  -  B burgdorferi Antibodies are NEGATIVE for Lymes' Disease from a tick bite ============================================================ ============================================================  - Iron & Vitamin B12 levels are Normal  ============================================================ ============================================================  - Testosterone  Level is Normal  ============================================================ ============================================================  - CBC - Normal  ============================================================ ============================================================  - CMET shows NORMAL Glucose , Kidney Functions, Electrolytes,   Blood antibody proteins, Liver albumin proteins, Liver & Bone Enzymes are   All    NORMAL ! ============================================================ ============================================================  - Magnesium - NORMAL ============================================================ ============================================================  - Total Chol = 147  Excellent   - Very low risk for Heart Attack  / Stroke ========================================================  - but Triglycerides (     ) or fats in blood are too high  (goal is less than 150)    - Recommend avoid fried & greasy foods,  sweets / candy,   - Avoid white rice  (brown or wild rice or Quinoa is OK),   - Avoid white  potatoes  (sweet potatoes are OK)   - Avoid anything made from white flour  - bagels, doughnuts, rolls, buns, biscuits, white and   wheat breads, pizza crust and traditional  pasta made of white flour & egg white  - (vegetarian pasta or spinach or wheat pasta is OK).    - Multi-grain bread is OK - like multi-grain flat bread or  sandwich thins.   - Avoid alcohol in excess.   - Exercise is also important. ============================================================ ============================================================  - Thyroid is Perfectly NORMAL ============================================================ ============================================================  - A1c - NORMAL ============================================================ ============================================================  - Vitamin D  = 106 - Borderline high Normal  (OK up to 115)                                                                     - Recommend keep dose same ============================================================ ============================================================  - All tests - Great !   The most normal complete lab tests  that I've seen in years !  - Keep up the [place] Work !

## 2021-05-10 LAB — LIPID PANEL
Cholesterol: 147 mg/dL (ref <200)
HDL: 40 mg/dL (ref >=40)
LDL Cholesterol (Calc): 88 mg/dL (calc)
Non-HDL Cholesterol (Calc): 107 mg/dL (calc) (ref <130)
Total CHOL/HDL Ratio: 3.7 (calc) (ref <5.0)
Triglycerides: 98 mg/dL (ref <150)

## 2021-05-10 LAB — COMPLETE METABOLIC PANEL WITH GFR
AG Ratio: 1.8 (calc) (ref 1.0–2.5)
ALT: 22 U/L (ref 9–46)
AST: 20 U/L (ref 10–35)
Albumin: 4.4 g/dL (ref 3.6–5.1)
Alkaline phosphatase (APISO): 78 U/L (ref 35–144)
BUN: 12 mg/dL (ref 7–25)
CO2: 31 mmol/L (ref 20–32)
Calcium: 9.4 mg/dL (ref 8.6–10.3)
Chloride: 103 mmol/L (ref 98–110)
Creat: 1.22 mg/dL (ref 0.70–1.33)
GFR, Est African American: 79 mL/min/{1.73_m2} (ref >=60)
GFR, Est Non African American: 68 mL/min/{1.73_m2} (ref >=60)
Globulin: 2.4 g/dL (calc) (ref 1.9–3.7)
Glucose, Bld: 85 mg/dL (ref 65–99)
Potassium: 4.1 mmol/L (ref 3.5–5.3)
Sodium: 140 mmol/L (ref 135–146)
Total Bilirubin: 0.9 mg/dL (ref 0.2–1.2)
Total Protein: 6.8 g/dL (ref 6.1–8.1)

## 2021-05-10 LAB — QUANTIFERON-TB GOLD PLUS
Mitogen-NIL: >10 IU/mL
NIL: 0.01 IU/mL
QuantiFERON-TB Gold Plus: NEGATIVE
TB1-NIL: 0 IU/mL
TB2-NIL: 0 IU/mL

## 2021-05-10 LAB — CBC WITH DIFFERENTIAL/PLATELET
Absolute Monocytes: 551 cells/uL (ref 200–950)
Basophils Absolute: 70 cells/uL (ref 0–200)
Basophils Relative: 1.3 %
Eosinophils Absolute: 297 cells/uL (ref 15–500)
Eosinophils Relative: 5.5 %
HCT: 48.4 % (ref 38.5–50.0)
Hemoglobin: 16.2 g/dL (ref 13.2–17.1)
Lymphs Abs: 1447 cells/uL (ref 850–3900)
MCH: 31.9 pg (ref 27.0–33.0)
MCHC: 33.5 g/dL (ref 32.0–36.0)
MCV: 95.3 fL (ref 80.0–100.0)
MPV: 12.8 fL — ABNORMAL HIGH (ref 7.5–12.5)
Monocytes Relative: 10.2 %
Neutro Abs: 3035 cells/uL (ref 1500–7800)
Neutrophils Relative %: 56.2 %
RBC: 5.08 10*6/uL (ref 4.20–5.80)
RDW: 12.7 % (ref 11.0–15.0)
Total Lymphocyte: 26.8 %
WBC: 5.4 10*3/uL (ref 3.8–10.8)

## 2021-05-10 LAB — URINALYSIS, ROUTINE W REFLEX MICROSCOPIC
Bilirubin Urine: NEGATIVE
Glucose, UA: NEGATIVE
Hgb urine dipstick: NEGATIVE
Ketones, ur: NEGATIVE
Leukocytes,Ua: NEGATIVE
Nitrite: NEGATIVE
Protein, ur: NEGATIVE
Specific Gravity, Urine: 1.023 (ref 1.001–1.035)
pH: 6 (ref 5.0–8.0)

## 2021-05-10 LAB — MAGNESIUM: Magnesium: 2.2 mg/dL (ref 1.5–2.5)

## 2021-05-10 LAB — HEMOGLOBIN A1C
Hgb A1c MFr Bld: 5.3 % of total Hgb (ref <5.7)
Mean Plasma Glucose: 105 mg/dL
eAG (mmol/L): 5.8 mmol/L

## 2021-05-10 LAB — MICROALBUMIN / CREATININE URINE RATIO
Creatinine, Urine: 210 mg/dL (ref 20–320)
Microalb Creat Ratio: 2 mcg/mg creat (ref <30)
Microalb, Ur: 0.4 mg/dL

## 2021-05-10 LAB — VITAMIN D 25 HYDROXY (VIT D DEFICIENCY, FRACTURES): Vit D, 25-Hydroxy: 106 ng/mL — ABNORMAL HIGH (ref 30–100)

## 2021-05-10 LAB — INSULIN, RANDOM: Insulin: 14.9 u[IU]/mL

## 2021-05-10 LAB — TSH: TSH: 1.58 mIU/L (ref 0.40–4.50)

## 2021-05-10 LAB — IRON, TOTAL/TOTAL IRON BINDING CAP
%SAT: 41 % (calc) (ref 20–48)
Iron: 134 ug/dL (ref 50–180)
TIBC: 327 mcg/dL (calc) (ref 250–425)

## 2021-05-10 LAB — TESTOSTERONE: Testosterone: 630 ng/dL (ref 250–827)

## 2021-05-10 LAB — VITAMIN B12: Vitamin B-12: 514 pg/mL (ref 200–1100)

## 2021-05-10 LAB — PSA: PSA: 0.43 ng/mL (ref <=4.00)

## 2021-05-10 LAB — B. BURGDORFI ANTIBODIES: B burgdorferi Ab IgG+IgM: <0.9 index

## 2021-05-10 NOTE — Progress Notes (Signed)
============================================================ ============================================================  -    PSA & TB Gold test - both returned Normal & OK   ============================================================ ============================================================

## 2021-05-22 ENCOUNTER — Other Ambulatory Visit: Payer: Self-pay | Admitting: Internal Medicine

## 2021-05-22 DIAGNOSIS — R2681 Unsteadiness on feet: Secondary | ICD-10-CM

## 2021-05-27 ENCOUNTER — Other Ambulatory Visit: Payer: Self-pay | Admitting: Internal Medicine

## 2021-05-27 DIAGNOSIS — F419 Anxiety disorder, unspecified: Secondary | ICD-10-CM

## 2021-05-30 ENCOUNTER — Other Ambulatory Visit: Payer: Self-pay | Admitting: Internal Medicine

## 2021-05-30 DIAGNOSIS — R2681 Unsteadiness on feet: Secondary | ICD-10-CM

## 2021-05-31 ENCOUNTER — Other Ambulatory Visit: Payer: Self-pay | Admitting: Internal Medicine

## 2021-05-31 DIAGNOSIS — R2681 Unsteadiness on feet: Secondary | ICD-10-CM

## 2021-06-10 ENCOUNTER — Other Ambulatory Visit: Payer: Self-pay | Admitting: Nurse Practitioner

## 2021-07-16 DIAGNOSIS — M25562 Pain in left knee: Secondary | ICD-10-CM | POA: Insufficient documentation

## 2021-07-18 ENCOUNTER — Other Ambulatory Visit: Payer: Self-pay | Admitting: Internal Medicine

## 2021-07-18 DIAGNOSIS — R251 Tremor, unspecified: Secondary | ICD-10-CM

## 2021-07-18 DIAGNOSIS — R42 Dizziness and giddiness: Secondary | ICD-10-CM

## 2021-08-12 ENCOUNTER — Other Ambulatory Visit: Payer: Self-pay | Admitting: Internal Medicine

## 2021-09-18 DIAGNOSIS — M2342 Loose body in knee, left knee: Secondary | ICD-10-CM | POA: Insufficient documentation

## 2021-11-18 ENCOUNTER — Ambulatory Visit: Payer: BC Managed Care – PPO | Admitting: Nurse Practitioner

## 2021-11-18 ENCOUNTER — Encounter: Payer: Self-pay | Admitting: Psychology

## 2022-02-02 ENCOUNTER — Other Ambulatory Visit: Payer: Self-pay | Admitting: Nurse Practitioner

## 2022-02-02 ENCOUNTER — Other Ambulatory Visit: Payer: Self-pay | Admitting: Adult Health

## 2022-02-19 ENCOUNTER — Encounter: Payer: Self-pay | Admitting: Psychology

## 2022-02-19 ENCOUNTER — Encounter: Payer: BC Managed Care – PPO | Attending: Psychology | Admitting: Psychology

## 2022-02-19 ENCOUNTER — Other Ambulatory Visit: Payer: Self-pay

## 2022-02-19 DIAGNOSIS — G4733 Obstructive sleep apnea (adult) (pediatric): Secondary | ICD-10-CM | POA: Diagnosis not present

## 2022-02-19 DIAGNOSIS — G4489 Other headache syndrome: Secondary | ICD-10-CM | POA: Insufficient documentation

## 2022-02-19 DIAGNOSIS — G3184 Mild cognitive impairment, so stated: Secondary | ICD-10-CM | POA: Insufficient documentation

## 2022-02-19 DIAGNOSIS — R413 Other amnesia: Secondary | ICD-10-CM | POA: Insufficient documentation

## 2022-02-19 DIAGNOSIS — R42 Dizziness and giddiness: Secondary | ICD-10-CM | POA: Diagnosis not present
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
## 2022-03-09 ENCOUNTER — Other Ambulatory Visit: Payer: Self-pay | Admitting: Adult Health

## 2022-03-09 DIAGNOSIS — F419 Anxiety disorder, unspecified: Secondary | ICD-10-CM

## 2022-03-23 ENCOUNTER — Other Ambulatory Visit: Payer: Self-pay | Admitting: Internal Medicine

## 2022-03-23 DIAGNOSIS — F419 Anxiety disorder, unspecified: Secondary | ICD-10-CM

## 2022-04-17 ENCOUNTER — Encounter: Payer: BC Managed Care – PPO | Attending: Psychology | Admitting: Psychology

## 2022-04-17 DIAGNOSIS — G4733 Obstructive sleep apnea (adult) (pediatric): Secondary | ICD-10-CM | POA: Insufficient documentation

## 2022-04-17 DIAGNOSIS — G4489 Other headache syndrome: Secondary | ICD-10-CM | POA: Insufficient documentation

## 2022-04-17 DIAGNOSIS — R413 Other amnesia: Secondary | ICD-10-CM | POA: Diagnosis present

## 2022-04-17 DIAGNOSIS — R42 Dizziness and giddiness: Secondary | ICD-10-CM | POA: Insufficient documentation

## 2022-04-17 DIAGNOSIS — G3184 Mild cognitive impairment, so stated: Secondary | ICD-10-CM | POA: Insufficient documentation

## 2022-04-24 ENCOUNTER — Encounter: Payer: Self-pay | Admitting: Psychology
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
## 2022-05-01 ENCOUNTER — Encounter: Payer: BC Managed Care – PPO | Attending: Psychology | Admitting: Psychology

## 2022-05-01 DIAGNOSIS — R413 Other amnesia: Secondary | ICD-10-CM | POA: Diagnosis present

## 2022-05-01 DIAGNOSIS — G3184 Mild cognitive impairment, so stated: Secondary | ICD-10-CM | POA: Diagnosis present

## 2022-05-01 DIAGNOSIS — G4733 Obstructive sleep apnea (adult) (pediatric): Secondary | ICD-10-CM | POA: Diagnosis present

## 2022-05-04 ENCOUNTER — Other Ambulatory Visit: Payer: Self-pay | Admitting: Nurse Practitioner

## 2022-05-07 DIAGNOSIS — G5603 Carpal tunnel syndrome, bilateral upper limbs: Secondary | ICD-10-CM | POA: Insufficient documentation

## 2022-05-12 ENCOUNTER — Encounter: Payer: Self-pay | Admitting: Psychology
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
## 2022-05-14 ENCOUNTER — Encounter: Payer: BC Managed Care – PPO | Admitting: Internal Medicine

## 2022-05-22 ENCOUNTER — Ambulatory Visit: Payer: BC Managed Care – PPO | Admitting: Psychology

## 2022-06-03 ENCOUNTER — Encounter: Payer: BC Managed Care – PPO | Attending: Psychology | Admitting: Psychology

## 2022-06-03 DIAGNOSIS — F801 Expressive language disorder: Secondary | ICD-10-CM

## 2022-06-03 DIAGNOSIS — R413 Other amnesia: Secondary | ICD-10-CM | POA: Diagnosis present

## 2022-06-03 DIAGNOSIS — G4733 Obstructive sleep apnea (adult) (pediatric): Secondary | ICD-10-CM | POA: Diagnosis present

## 2022-06-03 DIAGNOSIS — G3184 Mild cognitive impairment, so stated: Secondary | ICD-10-CM | POA: Diagnosis present

## 2022-06-03 DIAGNOSIS — R42 Dizziness and giddiness: Secondary | ICD-10-CM | POA: Diagnosis present

## 2022-06-04 ENCOUNTER — Encounter: Payer: Self-pay | Admitting: Psychology
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
## 2022-10-29 DIAGNOSIS — Z9889 Other specified postprocedural states: Secondary | ICD-10-CM | POA: Insufficient documentation

## 2022-12-10 ENCOUNTER — Encounter: Payer: Self-pay | Admitting: Psychology

## 2022-12-10 ENCOUNTER — Encounter: Payer: BC Managed Care – PPO | Attending: Psychology | Admitting: Psychology

## 2022-12-10 DIAGNOSIS — G3184 Mild cognitive impairment, so stated: Secondary | ICD-10-CM | POA: Diagnosis present

## 2022-12-10 DIAGNOSIS — F411 Generalized anxiety disorder: Secondary | ICD-10-CM | POA: Diagnosis present

## 2022-12-10 DIAGNOSIS — G4733 Obstructive sleep apnea (adult) (pediatric): Secondary | ICD-10-CM | POA: Insufficient documentation

## 2022-12-10 DIAGNOSIS — F3341 Major depressive disorder, recurrent, in partial remission: Secondary | ICD-10-CM | POA: Insufficient documentation

## 2022-12-10 DIAGNOSIS — G4489 Other headache syndrome: Secondary | ICD-10-CM | POA: Diagnosis present
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
## 2022-12-30 ENCOUNTER — Encounter: Payer: BC Managed Care – PPO | Attending: Psychology | Admitting: Psychology

## 2022-12-30 DIAGNOSIS — G4489 Other headache syndrome: Secondary | ICD-10-CM | POA: Diagnosis not present

## 2022-12-30 DIAGNOSIS — G4733 Obstructive sleep apnea (adult) (pediatric): Secondary | ICD-10-CM | POA: Insufficient documentation

## 2022-12-30 DIAGNOSIS — F3341 Major depressive disorder, recurrent, in partial remission: Secondary | ICD-10-CM | POA: Insufficient documentation

## 2022-12-30 DIAGNOSIS — F411 Generalized anxiety disorder: Secondary | ICD-10-CM | POA: Diagnosis not present

## 2022-12-30 DIAGNOSIS — R413 Other amnesia: Secondary | ICD-10-CM | POA: Diagnosis present

## 2022-12-30 DIAGNOSIS — G3184 Mild cognitive impairment, so stated: Secondary | ICD-10-CM
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
## 2023-06-18 DIAGNOSIS — R194 Change in bowel habit: Secondary | ICD-10-CM | POA: Diagnosis not present

## 2023-06-18 DIAGNOSIS — K621 Rectal polyp: Secondary | ICD-10-CM | POA: Diagnosis not present

## 2023-07-21 DIAGNOSIS — E669 Obesity, unspecified: Secondary | ICD-10-CM | POA: Diagnosis not present

## 2023-07-21 DIAGNOSIS — E8889 Other specified metabolic disorders: Secondary | ICD-10-CM | POA: Diagnosis not present

## 2023-07-21 DIAGNOSIS — E785 Hyperlipidemia, unspecified: Secondary | ICD-10-CM | POA: Diagnosis not present

## 2023-07-21 DIAGNOSIS — Z1389 Encounter for screening for other disorder: Secondary | ICD-10-CM | POA: Diagnosis not present

## 2023-07-21 DIAGNOSIS — Z6837 Body mass index (BMI) 37.0-37.9, adult: Secondary | ICD-10-CM | POA: Diagnosis not present

## 2023-08-13 ENCOUNTER — Encounter: Payer: BC Managed Care – PPO | Admitting: Psychology

## 2023-08-26 DIAGNOSIS — Z9889 Other specified postprocedural states: Secondary | ICD-10-CM | POA: Diagnosis not present

## 2023-08-26 DIAGNOSIS — E785 Hyperlipidemia, unspecified: Secondary | ICD-10-CM | POA: Diagnosis not present

## 2023-08-26 DIAGNOSIS — M1710 Unilateral primary osteoarthritis, unspecified knee: Secondary | ICD-10-CM | POA: Insufficient documentation

## 2023-08-26 DIAGNOSIS — M1712 Unilateral primary osteoarthritis, left knee: Secondary | ICD-10-CM | POA: Diagnosis not present

## 2023-09-16 DIAGNOSIS — E785 Hyperlipidemia, unspecified: Secondary | ICD-10-CM | POA: Diagnosis not present

## 2023-10-07 DIAGNOSIS — E785 Hyperlipidemia, unspecified: Secondary | ICD-10-CM | POA: Diagnosis not present

## 2023-10-12 DIAGNOSIS — R062 Wheezing: Secondary | ICD-10-CM | POA: Diagnosis not present

## 2023-10-12 DIAGNOSIS — J301 Allergic rhinitis due to pollen: Secondary | ICD-10-CM | POA: Diagnosis not present

## 2023-10-12 DIAGNOSIS — J3089 Other allergic rhinitis: Secondary | ICD-10-CM | POA: Diagnosis not present

## 2023-10-12 DIAGNOSIS — J3081 Allergic rhinitis due to animal (cat) (dog) hair and dander: Secondary | ICD-10-CM | POA: Diagnosis not present

## 2023-10-22 ENCOUNTER — Encounter: Payer: BC Managed Care – PPO | Attending: Psychology | Admitting: Psychology

## 2023-10-22 DIAGNOSIS — G3184 Mild cognitive impairment, so stated: Secondary | ICD-10-CM | POA: Diagnosis not present

## 2023-10-22 DIAGNOSIS — R413 Other amnesia: Secondary | ICD-10-CM | POA: Insufficient documentation

## 2023-10-22 DIAGNOSIS — G4733 Obstructive sleep apnea (adult) (pediatric): Secondary | ICD-10-CM | POA: Diagnosis present

## 2023-10-22 DIAGNOSIS — F411 Generalized anxiety disorder: Secondary | ICD-10-CM | POA: Diagnosis not present

## 2023-10-22 DIAGNOSIS — F3341 Major depressive disorder, recurrent, in partial remission: Secondary | ICD-10-CM | POA: Insufficient documentation

## 2023-10-22 DIAGNOSIS — G4489 Other headache syndrome: Secondary | ICD-10-CM | POA: Diagnosis not present

## 2023-10-28 DIAGNOSIS — E785 Hyperlipidemia, unspecified: Secondary | ICD-10-CM | POA: Diagnosis not present

## 2023-11-02 DIAGNOSIS — H903 Sensorineural hearing loss, bilateral: Secondary | ICD-10-CM | POA: Insufficient documentation

## 2023-11-24 DIAGNOSIS — E785 Hyperlipidemia, unspecified: Secondary | ICD-10-CM | POA: Diagnosis not present
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
## 2023-12-31 DIAGNOSIS — G44209 Tension-type headache, unspecified, not intractable: Secondary | ICD-10-CM | POA: Diagnosis not present

## 2023-12-31 DIAGNOSIS — R200 Anesthesia of skin: Secondary | ICD-10-CM | POA: Diagnosis not present

## 2023-12-31 DIAGNOSIS — R4789 Other speech disturbances: Secondary | ICD-10-CM | POA: Diagnosis not present

## 2024-01-04 DIAGNOSIS — R454 Irritability and anger: Secondary | ICD-10-CM | POA: Diagnosis not present

## 2024-01-04 DIAGNOSIS — F411 Generalized anxiety disorder: Secondary | ICD-10-CM | POA: Diagnosis not present

## 2024-01-04 DIAGNOSIS — F331 Major depressive disorder, recurrent, moderate: Secondary | ICD-10-CM | POA: Diagnosis not present

## 2024-01-13 DIAGNOSIS — E785 Hyperlipidemia, unspecified: Secondary | ICD-10-CM | POA: Diagnosis not present

## 2024-01-22 DIAGNOSIS — M5416 Radiculopathy, lumbar region: Secondary | ICD-10-CM | POA: Diagnosis not present

## 2024-01-25 DIAGNOSIS — F411 Generalized anxiety disorder: Secondary | ICD-10-CM | POA: Diagnosis not present

## 2024-01-25 DIAGNOSIS — F331 Major depressive disorder, recurrent, moderate: Secondary | ICD-10-CM | POA: Diagnosis not present

## 2024-01-25 DIAGNOSIS — R454 Irritability and anger: Secondary | ICD-10-CM | POA: Diagnosis not present

## 2024-01-26 ENCOUNTER — Emergency Department (HOSPITAL_COMMUNITY): Payer: Medicare Other

## 2024-01-26 ENCOUNTER — Other Ambulatory Visit: Payer: Self-pay | Admitting: Internal Medicine

## 2024-01-26 ENCOUNTER — Encounter (HOSPITAL_COMMUNITY): Payer: Self-pay | Admitting: Emergency Medicine

## 2024-01-26 ENCOUNTER — Encounter: Payer: Self-pay | Admitting: Internal Medicine

## 2024-01-26 ENCOUNTER — Emergency Department (HOSPITAL_COMMUNITY)
Admission: EM | Admit: 2024-01-26 | Discharge: 2024-01-27 | Disposition: A | Discharge status: Home / Self Care | Payer: Medicare Other | Attending: Emergency Medicine | Admitting: Emergency Medicine

## 2024-01-26 DIAGNOSIS — R202 Paresthesia of skin: Secondary | ICD-10-CM | POA: Diagnosis not present

## 2024-01-26 DIAGNOSIS — M5416 Radiculopathy, lumbar region: Secondary | ICD-10-CM

## 2024-01-26 DIAGNOSIS — M545 Low back pain, unspecified: Secondary | ICD-10-CM

## 2024-01-26 DIAGNOSIS — R531 Weakness: Secondary | ICD-10-CM | POA: Insufficient documentation

## 2024-01-26 DIAGNOSIS — R0789 Other chest pain: Secondary | ICD-10-CM | POA: Diagnosis not present

## 2024-01-26 DIAGNOSIS — I10 Essential (primary) hypertension: Secondary | ICD-10-CM | POA: Insufficient documentation

## 2024-01-26 DIAGNOSIS — R519 Headache, unspecified: Secondary | ICD-10-CM | POA: Diagnosis not present

## 2024-01-26 DIAGNOSIS — M4807 Spinal stenosis, lumbosacral region: Secondary | ICD-10-CM | POA: Diagnosis not present

## 2024-01-26 DIAGNOSIS — M5432 Sciatica, left side: Secondary | ICD-10-CM

## 2024-01-26 DIAGNOSIS — R29818 Other symptoms and signs involving the nervous system: Secondary | ICD-10-CM | POA: Diagnosis not present

## 2024-01-26 DIAGNOSIS — R209 Unspecified disturbances of skin sensation: Secondary | ICD-10-CM | POA: Diagnosis not present

## 2024-01-26 DIAGNOSIS — M47817 Spondylosis without myelopathy or radiculopathy, lumbosacral region: Secondary | ICD-10-CM | POA: Diagnosis not present

## 2024-01-26 DIAGNOSIS — R200 Anesthesia of skin: Secondary | ICD-10-CM | POA: Diagnosis not present

## 2024-01-26 DIAGNOSIS — R29898 Other symptoms and signs involving the musculoskeletal system: Secondary | ICD-10-CM | POA: Diagnosis not present

## 2024-01-26 DIAGNOSIS — I2510 Atherosclerotic heart disease of native coronary artery without angina pectoris: Secondary | ICD-10-CM | POA: Diagnosis not present

## 2024-01-26 DIAGNOSIS — M5135 Other intervertebral disc degeneration, thoracolumbar region: Secondary | ICD-10-CM | POA: Diagnosis not present

## 2024-01-26 DIAGNOSIS — J984 Other disorders of lung: Secondary | ICD-10-CM | POA: Diagnosis not present

## 2024-01-26 DIAGNOSIS — R42 Dizziness and giddiness: Secondary | ICD-10-CM | POA: Insufficient documentation

## 2024-01-26 DIAGNOSIS — R030 Elevated blood-pressure reading, without diagnosis of hypertension: Secondary | ICD-10-CM

## 2024-01-26 LAB — CBC
HCT: 44.2 % (ref 39.0–52.0)
Hemoglobin: 15.8 g/dL (ref 13.0–17.0)
MCH: 32.3 pg (ref 26.0–34.0)
MCHC: 35.7 g/dL (ref 30.0–36.0)
MCV: 90.4 fL (ref 80.0–100.0)
Platelets: 87 10*3/uL — ABNORMAL LOW (ref 150–400)
RBC: 4.89 MIL/uL (ref 4.22–5.81)
RDW: 12.6 % (ref 11.5–15.5)
WBC: 8.5 10*3/uL (ref 4.0–10.5)
nRBC: 0 % (ref 0.0–0.2)

## 2024-01-26 LAB — BASIC METABOLIC PANEL
Anion gap: 8 (ref 5–15)
BUN: 11 mg/dL (ref 6–20)
CO2: 23 mmol/L (ref 22–32)
Calcium: 10.1 mg/dL (ref 8.9–10.3)
Chloride: 107 mmol/L (ref 98–111)
Creatinine, Ser: 1.1 mg/dL (ref 0.61–1.24)
GFR, Estimated: >60 mL/min (ref >60)
Glucose, Bld: 110 mg/dL — ABNORMAL HIGH (ref 70–99)
Potassium: 4 mmol/L (ref 3.5–5.1)
Sodium: 138 mmol/L (ref 135–145)

## 2024-01-26 LAB — TROPONIN I (HIGH SENSITIVITY): Troponin I (High Sensitivity): 4 ng/L (ref <18)

## 2024-01-26 MED ORDER — KETOROLAC TROMETHAMINE 30 MG/ML IJ SOLN
15.0000 mg | Freq: Once | INTRAMUSCULAR | Status: AC
  Administered 2024-01-26: 15 mg via INTRAVENOUS
  Filled 2024-01-26: qty 1

## 2024-01-26 MED ORDER — METOCLOPRAMIDE HCL 5 MG/ML IJ SOLN
10.0000 mg | Freq: Once | INTRAMUSCULAR | Status: AC
  Administered 2024-01-26: 10 mg via INTRAVENOUS
  Filled 2024-01-26: qty 2

## 2024-01-26 MED ORDER — DIPHENHYDRAMINE HCL 50 MG/ML IJ SOLN
25.0000 mg | Freq: Once | INTRAMUSCULAR | Status: AC
  Administered 2024-01-26: 25 mg via INTRAVENOUS
  Filled 2024-01-26: qty 1

## 2024-01-26 MED ORDER — LORAZEPAM 1 MG PO TABS
1.0000 mg | ORAL_TABLET | Freq: Once | ORAL | Status: AC
  Administered 2024-01-27: 1 mg via ORAL
  Filled 2024-01-26: qty 1

## 2024-01-26 MED ORDER — LACTATED RINGERS IV BOLUS
1000.0000 mL | Freq: Once | INTRAVENOUS | Status: AC
  Administered 2024-01-26: 1000 mL via INTRAVENOUS

## 2024-01-26 NOTE — ED Provider Notes (Signed)
EMERGENCY DEPARTMENT AT [facility] Provider Note   CSN: [id number] Arrival date & time: 01/26/24  1736     History {Add pertinent medical, surgical, social history, OB history to HPI:1} Chief Complaint  Patient presents with   Headache   Numbness    Shane Saunders is a 53 y.o. male.  Patient with a history of vestibular migraines, obesity, sleep apnea, prediabetes and hyperlipidemia presenting with 1 week history of bilateral temporal headache.  Headache comes and goes waxing waning in severity.  Taking Excedrin at home with partial relief.  Denies thunderclap onset.  Headache is gradual buildup.  Associate with nausea.  No photophobia or phonophobia.  No fever.  No vomiting.  Does have some tightness in his chest for the past several days as well that is constant.  Reports intermittent tingling to his bilateral legs that has been constant for several weeks but no significant back pain.  No weakness.  No bowel or bladder incontinence.  No fever or vomiting.  No history of IV drug abuse or cancer.  Headache feels different than his usual migraines.  No visual changes.  No new difficulty speaking.  Reports tingling to his bilateral legs which has been an intermittent problem for several weeks.  No weakness.  No bowel or bladder incontinence.  No fever, cough, runny nose or sore throat.  Feels "heaviness" in his chest for the past 2 days as well but concerned it could be anxiety.  The history is provided by the patient and the spouse.  Headache Associated symptoms: dizziness, numbness and weakness   Associated symptoms: no congestion, no cough, no fever, no nausea, no photophobia and no vomiting        Home Medications Prior to Admission medications   Medication Sig Start Date End Date Taking? Authorizing Provider  atorvastatin (LIPITOR) 80 MG tablet TAKE 1 TABLET BY MOUTH EVERY DAY FOR CHOLESTEROL 08/12/21   Judd Gaudier, NP  azelastine (ASTELIN) 0.1 % nasal  spray     [provider]  buPROPion (WELLBUTRIN XL) 300 MG 24 hr tablet Take 1 tablet every Morning for Mood, Focus & Concentration 03/07/21   Judd Gaudier, NP  Cholecalciferol (VITAMIN D3) 250 MCG (10000 UT) TABS Take 10,000 Units by mouth daily.    [provider]  escitalopram (LEXAPRO) 20 MG tablet Take  1 tablet  Daily  for Mood 05/28/21   [name], MD  fexofenadine [facility]) 60 MG tablet Takes 1 tablet 2 x /day as needed for Allergies 03/21/20   [name], MD  omeprazole (PRILOSEC) 40 MG capsule TAKE 1 CAPSULE BY MOUTH EVERY DAY BEFORE BREAKFAST 02/03/22   Meredith Pel, NP      Allergies    Codeine, Effexor [venlafaxine], Prednisone, Tramadol hcl, and Zinc    Review of Systems   Review of Systems  Constitutional:  Negative for activity change, appetite change and fever.  HENT:  Negative for congestion and sneezing.   Eyes:  Negative for photophobia and visual disturbance.  Respiratory:  Negative for cough, chest tightness and shortness of breath.   Cardiovascular:  Negative for chest pain.  Gastrointestinal:  Negative for nausea and vomiting.  Genitourinary:  Negative for dysuria and hematuria.  Musculoskeletal:  Negative for arthralgias.  Skin:  Negative for rash.  Neurological:  Positive for dizziness, weakness, light-headedness, numbness and headaches.   all other systems are negative except as noted in the HPI and PMH.    Physical Exam Updated Vital Signs  BP (!) 173/109   Pulse 86   Temp 98 F (36.7 C) (Oral)   Resp 18   SpO2 98%  Physical Exam Vitals and nursing note reviewed.  Constitutional:      General: He is not in acute distress.    Appearance: He is well-developed.  HENT:     Head: Normocephalic and atraumatic.     Comments: No temporal artery tenderness    Mouth/Throat:     Pharynx: No oropharyngeal exudate.  Eyes:     Conjunctiva/sclera: Conjunctivae normal.     Pupils: Pupils are equal, round, and reactive to  light.  Neck:     Comments: No meningismus. Cardiovascular:     Rate and Rhythm: Normal rate and regular rhythm.     Heart sounds: Normal heart sounds. No murmur heard. Pulmonary:     Effort: Pulmonary effort is normal. No respiratory distress.     Breath sounds: Normal breath sounds.  Abdominal:     Palpations: Abdomen is soft.     Tenderness: There is no abdominal tenderness. There is no guarding or rebound.  Musculoskeletal:        General: No tenderness. Normal range of motion.     Cervical back: Normal range of motion and neck supple.     Comments: 5/5 strength in bilateral lower extremities. Ankle plantar and dorsiflexion intact. Great toe extension intact bilaterally. +2 DP and PT pulses. +2 patellar reflexes bilaterally. Normal gait.   Skin:    General: Skin is warm.  Neurological:     Mental Status: He is alert and oriented to person, place, and time.     Cranial Nerves: No cranial nerve deficit.     Motor: No abnormal muscle tone.     Coordination: Coordination normal.     Comments: No ataxia on finger to nose bilaterally. No pronator drift. 5/5 strength throughout. CN 2-12 intact.Equal grip strength. Sensation intact.   Psychiatric:        Behavior: Behavior normal.     ED Results / Procedures / Treatments   Labs (all labs ordered are listed, but only abnormal results are displayed) Labs Reviewed  CBC - Abnormal; Notable for the following components:      Result Value   Platelets 87 (*)    All other components within normal limits  BASIC METABOLIC PANEL - Abnormal; Notable for the following components:   Glucose, Bld 110 (*)    All other components within normal limits  TROPONIN I (HIGH SENSITIVITY)  TROPONIN I (HIGH SENSITIVITY)    EKG EKG Interpretation Date/Time:  Tuesday January 26 2024 18:35:54 EST Ventricular Rate:  68 PR Interval:  169 QRS Duration:  89 QT Interval:  373 QTC Calculation: 397 R Axis:   93  Text Interpretation: Sinus rhythm  Borderline right axis deviation Baseline wander in lead(s) III No significant change was found Confirmed by Glynn Octave [id number]) on 01/26/2024 11:31:51 PM  Radiology CT Head Wo Contrast Result Date: 01/26/2024 CLINICAL DATA:  Chest tightness and leg tingling for 2 days. EXAM: CT HEAD WITHOUT CONTRAST TECHNIQUE: Contiguous axial images were obtained from the base of the skull through the vertex without intravenous contrast. RADIATION DOSE REDUCTION: This exam was performed according to the departmental dose-optimization program which includes automated exposure control, adjustment of the mA and/or kV according to patient size and/or use of iterative reconstruction technique. COMPARISON:  March 24, 2020 FINDINGS: Brain: No evidence of acute infarction, hemorrhage, hydrocephalus, extra-axial collection or mass lesion/mass effect. Vascular: No hyperdense  vessel or unexpected calcification. Skull: Normal. Negative for fracture or focal lesion. Sinuses/Orbits: No acute finding. Other: None. IMPRESSION: No acute intracranial pathology. Electronically Signed   By: Aram Candela M.D.   On: 01/26/2024 19:36   DG Chest Portable 1 View Result Date: 01/26/2024 CLINICAL DATA:  Chest tightness. EXAM: PORTABLE CHEST 1 VIEW COMPARISON:  03/12/2020 FINDINGS: The heart size and mediastinal contours are within normal limits. Scarring at both lung bases, left greater than right. There is no evidence of pulmonary edema, consolidation, pneumothorax or pleural fluid. The visualized skeletal structures are unremarkable. IMPRESSION: Bibasilar scarring, left greater than right. No acute findings. Electronically Signed   By: Irish Lack M.D.   On: 01/26/2024 18:41    Procedures Procedures  {Document cardiac monitor, telemetry assessment procedure when appropriate:1}  Medications Ordered in ED Medications  LORazepam (ATIVAN) tablet 1 mg (has no administration in time range)  lactated ringers bolus 1,000 mL (has no  administration in time range)  ketorolac (TORADOL) 30 MG/ML injection 15 mg (has no administration in time range)  metoCLOPramide (REGLAN) injection 10 mg (has no administration in time range)  diphenhydrAMINE (BENADRYL) injection 25 mg (has no administration in time range)    ED Course/ Medical Decision Making/ A&P   {   Click here for ABCD2, HEART and other calculatorsREFRESH Note before signing :1}                              Medical Decision Making Amount and/or Complexity of Data Reviewed Labs: ordered. Decision-making details documented in ED Course. Radiology: ordered and independent interpretation performed. Decision-making details documented in ED Course. ECG/medicine tests: ordered and independent interpretation performed. Decision-making details documented in ED Course.  Risk Prescription drug management.   1 week of diffuse headache similar to previous vestibular migraines.  Neurological exam is nonfocal.  Does have some numbness to his legs which has been an issue for several weeks.  No weakness.  Intact distal strength, sensation, pulses and reflexes.  Low concern for cord compression or cauda equina.  CT head in triage is negative for acute findings.  EKG is sinus rhythm without acute ST changes.  {Document critical care time when appropriate:1} {Document review of labs and clinical decision tools ie heart score, Chads2Vasc2 etc:1}  {Document your independent review of radiology images, and any outside records:1} {Document your discussion with family members, caretakers, and with consultants:1} {Document social determinants of health affecting pt's care:1} {Document your decision making why or why not admission, treatments were needed:1} Final Clinical Impression(s) / ED Diagnoses Final diagnoses:  None    Rx / DC Orders ED Discharge Orders     None

## 2024-01-26 NOTE — ED Triage Notes (Signed)
Pt here form home with c/o headache and bil arm and leg tingling /numbness that has been going for approx 2 days , pt also c/o some chest tightness

## 2024-01-26 NOTE — ED Provider Triage Note (Signed)
Emergency Medicine Provider Triage Evaluation Note  Shane Saunders , a 53 y.o. male  was evaluated in triage.  Pt complains of chest tightness, arm and leg tingling for 2 days, hx of anxiety, told to come for stroke rule out by PCP. No numbness, weakness, vision changes, confusion, dysarthria.  Review of Systems  Positive: Tingling, chest tightness Negative: Shob, numbness, weakness, vision changes  Physical Exam  BP (!) 160/107 (BP Location: Right Arm)   Pulse 71   Temp 97.9 F (36.6 C) (Oral)   Resp 16   SpO2 98%  Gen:   Awake, no distress   Resp:  Normal effort  MSK:   Moves extremities without difficulty  Other:  Cranial nerves II through XII grossly intact.  Intact finger-nose, intact heel-to-shin.  Romberg negative, gait normal.  Alert and oriented x3.  Moves all 4 limbs spontaneously, normal coordination.  No pronator drift.  Intact strength 5 out of 5 bilateral upper and lower extremities.  Medical Decision Making  Medically screening exam initiated at 6:24 PM.  Appropriate orders placed.  Shane Saunders was informed that the remainder of the evaluation will be completed by another provider, this initial triage assessment does not replace that evaluation, and the importance of remaining in the ED until their evaluation is complete.  Workup initiated in triage    Olene Floss, [place] 01/26/24 1827

## 2024-01-27 ENCOUNTER — Encounter (HOSPITAL_COMMUNITY): Payer: Self-pay

## 2024-01-27 ENCOUNTER — Emergency Department (HOSPITAL_COMMUNITY): Payer: Medicare Other

## 2024-01-27 DIAGNOSIS — I2510 Atherosclerotic heart disease of native coronary artery without angina pectoris: Secondary | ICD-10-CM | POA: Diagnosis not present

## 2024-01-27 DIAGNOSIS — R29898 Other symptoms and signs involving the musculoskeletal system: Secondary | ICD-10-CM | POA: Diagnosis not present

## 2024-01-27 DIAGNOSIS — R519 Headache, unspecified: Secondary | ICD-10-CM | POA: Diagnosis not present

## 2024-01-27 DIAGNOSIS — R29818 Other symptoms and signs involving the nervous system: Secondary | ICD-10-CM | POA: Diagnosis not present

## 2024-01-27 DIAGNOSIS — M4807 Spinal stenosis, lumbosacral region: Secondary | ICD-10-CM | POA: Diagnosis not present

## 2024-01-27 DIAGNOSIS — I10 Essential (primary) hypertension: Secondary | ICD-10-CM | POA: Diagnosis not present

## 2024-01-27 DIAGNOSIS — M5135 Other intervertebral disc degeneration, thoracolumbar region: Secondary | ICD-10-CM | POA: Diagnosis not present

## 2024-01-27 DIAGNOSIS — R531 Weakness: Secondary | ICD-10-CM | POA: Diagnosis not present

## 2024-01-27 DIAGNOSIS — R0789 Other chest pain: Secondary | ICD-10-CM | POA: Diagnosis not present

## 2024-01-27 DIAGNOSIS — R200 Anesthesia of skin: Secondary | ICD-10-CM | POA: Diagnosis not present

## 2024-01-27 DIAGNOSIS — M47817 Spondylosis without myelopathy or radiculopathy, lumbosacral region: Secondary | ICD-10-CM | POA: Diagnosis not present

## 2024-01-27 LAB — SEDIMENTATION RATE: Sed Rate: 2 mm/h (ref 0–16)

## 2024-01-27 LAB — TROPONIN I (HIGH SENSITIVITY): Troponin I (High Sensitivity): 3 ng/L (ref <18)

## 2024-01-27 MED ORDER — LABETALOL HCL 5 MG/ML IV SOLN
20.0000 mg | Freq: Once | INTRAVENOUS | Status: AC
Start: 2024-01-27 — End: 2024-01-27
  Administered 2024-01-27: 20 mg via INTRAVENOUS
  Filled 2024-01-27: qty 4

## 2024-01-27 MED ORDER — IOHEXOL 350 MG/ML SOLN
100.0000 mL | Freq: Once | INTRAVENOUS | Status: AC | PRN
  Administered 2024-01-27: 100 mL via INTRAVENOUS

## 2024-01-27 NOTE — Discharge Instructions (Signed)
Your testing is reassuring.  No evidence of stroke.  Your blood pressure was elevated today and you should follow-up with your doctor regarding your blood pressure.  Take a record of your blood pressure on daily basis and show your doctor to see if need to be on blood pressure medication. Return to the ED worsening headache, chest pain, difficulty speaking, difficulty swallowing, unilateral weakness, numbness, tingling, other concerns.

## 2024-01-27 NOTE — ED Notes (Signed)
Patient transported to MRI

## 2024-01-28 DIAGNOSIS — R200 Anesthesia of skin: Secondary | ICD-10-CM | POA: Diagnosis not present

## 2024-01-28 DIAGNOSIS — G5603 Carpal tunnel syndrome, bilateral upper limbs: Secondary | ICD-10-CM | POA: Diagnosis not present

## 2024-01-28 DIAGNOSIS — M79671 Pain in right foot: Secondary | ICD-10-CM | POA: Diagnosis not present

## 2024-01-29 DIAGNOSIS — M79671 Pain in right foot: Secondary | ICD-10-CM | POA: Insufficient documentation

## 2024-01-29 DIAGNOSIS — R6889 Other general symptoms and signs: Secondary | ICD-10-CM | POA: Insufficient documentation

## 2024-01-31 ENCOUNTER — Other Ambulatory Visit: Payer: Medicare Other

## 2024-02-05 DIAGNOSIS — M47816 Spondylosis without myelopathy or radiculopathy, lumbar region: Secondary | ICD-10-CM | POA: Diagnosis not present

## 2024-02-15 DIAGNOSIS — I10 Essential (primary) hypertension: Secondary | ICD-10-CM | POA: Diagnosis not present

## 2024-02-15 DIAGNOSIS — G43719 Chronic migraine without aura, intractable, without status migrainosus: Secondary | ICD-10-CM | POA: Diagnosis not present

## 2024-02-15 DIAGNOSIS — G44009 Cluster headache syndrome, unspecified, not intractable: Secondary | ICD-10-CM | POA: Diagnosis not present

## 2024-02-15 DIAGNOSIS — G4486 Cervicogenic headache: Secondary | ICD-10-CM | POA: Diagnosis not present

## 2024-02-15 DIAGNOSIS — G4733 Obstructive sleep apnea (adult) (pediatric): Secondary | ICD-10-CM | POA: Diagnosis not present

## 2024-02-18 DIAGNOSIS — M47816 Spondylosis without myelopathy or radiculopathy, lumbar region: Secondary | ICD-10-CM | POA: Diagnosis not present

## 2024-02-22 DIAGNOSIS — F411 Generalized anxiety disorder: Secondary | ICD-10-CM | POA: Diagnosis not present

## 2024-02-22 DIAGNOSIS — R454 Irritability and anger: Secondary | ICD-10-CM | POA: Diagnosis not present

## 2024-02-22 DIAGNOSIS — F331 Major depressive disorder, recurrent, moderate: Secondary | ICD-10-CM | POA: Diagnosis not present

## 2024-03-04 DIAGNOSIS — G5603 Carpal tunnel syndrome, bilateral upper limbs: Secondary | ICD-10-CM | POA: Diagnosis not present

## 2024-03-09 DIAGNOSIS — E669 Obesity, unspecified: Secondary | ICD-10-CM | POA: Diagnosis not present

## 2024-03-09 DIAGNOSIS — E785 Hyperlipidemia, unspecified: Secondary | ICD-10-CM | POA: Diagnosis not present

## 2024-03-09 DIAGNOSIS — Z6837 Body mass index (BMI) 37.0-37.9, adult: Secondary | ICD-10-CM | POA: Diagnosis not present

## 2024-03-09 DIAGNOSIS — F331 Major depressive disorder, recurrent, moderate: Secondary | ICD-10-CM | POA: Diagnosis not present

## 2024-03-09 DIAGNOSIS — F411 Generalized anxiety disorder: Secondary | ICD-10-CM | POA: Diagnosis not present

## 2024-03-09 DIAGNOSIS — R454 Irritability and anger: Secondary | ICD-10-CM | POA: Diagnosis not present

## 2024-03-10 DIAGNOSIS — Z0000 Encounter for general adult medical examination without abnormal findings: Secondary | ICD-10-CM | POA: Diagnosis not present

## 2024-03-10 DIAGNOSIS — N401 Enlarged prostate with lower urinary tract symptoms: Secondary | ICD-10-CM | POA: Diagnosis not present

## 2024-03-10 DIAGNOSIS — Z125 Encounter for screening for malignant neoplasm of prostate: Secondary | ICD-10-CM | POA: Diagnosis not present

## 2024-03-10 DIAGNOSIS — D696 Thrombocytopenia, unspecified: Secondary | ICD-10-CM | POA: Diagnosis not present

## 2024-03-10 DIAGNOSIS — Z23 Encounter for immunization: Secondary | ICD-10-CM | POA: Diagnosis not present

## 2024-03-10 DIAGNOSIS — E7800 Pure hypercholesterolemia, unspecified: Secondary | ICD-10-CM | POA: Diagnosis not present

## 2024-03-10 DIAGNOSIS — I10 Essential (primary) hypertension: Secondary | ICD-10-CM | POA: Diagnosis not present

## 2024-03-10 DIAGNOSIS — G3184 Mild cognitive impairment, so stated: Secondary | ICD-10-CM | POA: Diagnosis not present

## 2024-03-10 DIAGNOSIS — E538 Deficiency of other specified B group vitamins: Secondary | ICD-10-CM | POA: Diagnosis not present

## 2024-03-14 DIAGNOSIS — M47816 Spondylosis without myelopathy or radiculopathy, lumbar region: Secondary | ICD-10-CM | POA: Diagnosis not present

## 2024-03-16 DIAGNOSIS — K219 Gastro-esophageal reflux disease without esophagitis: Secondary | ICD-10-CM | POA: Diagnosis not present

## 2024-03-16 DIAGNOSIS — R1310 Dysphagia, unspecified: Secondary | ICD-10-CM | POA: Diagnosis not present

## 2024-03-18 DIAGNOSIS — K3189 Other diseases of stomach and duodenum: Secondary | ICD-10-CM | POA: Diagnosis not present

## 2024-03-18 DIAGNOSIS — K2950 Unspecified chronic gastritis without bleeding: Secondary | ICD-10-CM | POA: Diagnosis not present

## 2024-03-18 DIAGNOSIS — K219 Gastro-esophageal reflux disease without esophagitis: Secondary | ICD-10-CM | POA: Diagnosis not present

## 2024-03-18 DIAGNOSIS — K2289 Other specified disease of esophagus: Secondary | ICD-10-CM | POA: Diagnosis not present

## 2024-03-18 DIAGNOSIS — R1310 Dysphagia, unspecified: Secondary | ICD-10-CM | POA: Diagnosis not present

## 2024-03-18 DIAGNOSIS — R1319 Other dysphagia: Secondary | ICD-10-CM | POA: Diagnosis not present

## 2024-03-30 DIAGNOSIS — F411 Generalized anxiety disorder: Secondary | ICD-10-CM | POA: Diagnosis not present

## 2024-03-30 DIAGNOSIS — F331 Major depressive disorder, recurrent, moderate: Secondary | ICD-10-CM | POA: Diagnosis not present

## 2024-03-30 DIAGNOSIS — R454 Irritability and anger: Secondary | ICD-10-CM | POA: Diagnosis not present

## 2024-03-31 DIAGNOSIS — R972 Elevated prostate specific antigen [PSA]: Secondary | ICD-10-CM | POA: Diagnosis not present

## 2024-04-06 DIAGNOSIS — E785 Hyperlipidemia, unspecified: Secondary | ICD-10-CM | POA: Diagnosis not present

## 2024-04-06 DIAGNOSIS — E669 Obesity, unspecified: Secondary | ICD-10-CM | POA: Diagnosis not present

## 2024-04-06 DIAGNOSIS — Z6837 Body mass index (BMI) 37.0-37.9, adult: Secondary | ICD-10-CM | POA: Diagnosis not present

## 2024-04-06 DIAGNOSIS — G4733 Obstructive sleep apnea (adult) (pediatric): Secondary | ICD-10-CM | POA: Diagnosis not present

## 2024-04-13 DIAGNOSIS — M47816 Spondylosis without myelopathy or radiculopathy, lumbar region: Secondary | ICD-10-CM | POA: Diagnosis not present

## 2024-04-19 DIAGNOSIS — F411 Generalized anxiety disorder: Secondary | ICD-10-CM | POA: Diagnosis not present

## 2024-04-19 DIAGNOSIS — R454 Irritability and anger: Secondary | ICD-10-CM | POA: Diagnosis not present

## 2024-04-19 DIAGNOSIS — F331 Major depressive disorder, recurrent, moderate: Secondary | ICD-10-CM | POA: Diagnosis not present

## 2024-05-05 ENCOUNTER — Encounter: Payer: Self-pay | Admitting: Psychology

## 2024-05-05 ENCOUNTER — Encounter: Attending: Psychology | Admitting: Psychology

## 2024-05-05 DIAGNOSIS — G4489 Other headache syndrome: Secondary | ICD-10-CM | POA: Diagnosis not present

## 2024-05-05 DIAGNOSIS — G3184 Mild cognitive impairment, so stated: Secondary | ICD-10-CM | POA: Diagnosis not present

## 2024-05-05 DIAGNOSIS — F411 Generalized anxiety disorder: Secondary | ICD-10-CM

## 2024-05-05 DIAGNOSIS — F3341 Major depressive disorder, recurrent, in partial remission: Secondary | ICD-10-CM | POA: Diagnosis not present
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
## 2024-05-11 DIAGNOSIS — E669 Obesity, unspecified: Secondary | ICD-10-CM | POA: Diagnosis not present

## 2024-05-11 DIAGNOSIS — G4733 Obstructive sleep apnea (adult) (pediatric): Secondary | ICD-10-CM | POA: Diagnosis not present

## 2024-05-11 DIAGNOSIS — E785 Hyperlipidemia, unspecified: Secondary | ICD-10-CM | POA: Diagnosis not present

## 2024-05-11 DIAGNOSIS — Z6837 Body mass index (BMI) 37.0-37.9, adult: Secondary | ICD-10-CM | POA: Diagnosis not present

## 2024-05-13 DIAGNOSIS — H9313 Tinnitus, bilateral: Secondary | ICD-10-CM | POA: Insufficient documentation

## 2024-05-13 DIAGNOSIS — R42 Dizziness and giddiness: Secondary | ICD-10-CM | POA: Insufficient documentation

## 2024-05-13 DIAGNOSIS — M542 Cervicalgia: Secondary | ICD-10-CM | POA: Insufficient documentation

## 2024-05-13 DIAGNOSIS — R030 Elevated blood-pressure reading, without diagnosis of hypertension: Secondary | ICD-10-CM | POA: Insufficient documentation

## 2024-05-17 DIAGNOSIS — M47816 Spondylosis without myelopathy or radiculopathy, lumbar region: Secondary | ICD-10-CM | POA: Diagnosis not present

## 2024-05-24 ENCOUNTER — Encounter (HOSPITAL_COMMUNITY): Payer: Self-pay

## 2024-05-24 ENCOUNTER — Emergency Department (HOSPITAL_COMMUNITY)
Admission: EM | Admit: 2024-05-24 | Discharge: 2024-05-24 | Disposition: A | Discharge status: Home / Self Care | Attending: Student | Admitting: Student

## 2024-05-24 ENCOUNTER — Emergency Department (HOSPITAL_COMMUNITY)

## 2024-05-24 ENCOUNTER — Other Ambulatory Visit: Payer: Self-pay

## 2024-05-24 DIAGNOSIS — R109 Unspecified abdominal pain: Secondary | ICD-10-CM | POA: Diagnosis not present

## 2024-05-24 DIAGNOSIS — R319 Hematuria, unspecified: Secondary | ICD-10-CM | POA: Diagnosis not present

## 2024-05-24 DIAGNOSIS — N132 Hydronephrosis with renal and ureteral calculous obstruction: Secondary | ICD-10-CM | POA: Insufficient documentation

## 2024-05-24 DIAGNOSIS — I10 Essential (primary) hypertension: Secondary | ICD-10-CM | POA: Insufficient documentation

## 2024-05-24 DIAGNOSIS — Z79899 Other long term (current) drug therapy: Secondary | ICD-10-CM | POA: Insufficient documentation

## 2024-05-24 DIAGNOSIS — N200 Calculus of kidney: Secondary | ICD-10-CM

## 2024-05-24 DIAGNOSIS — Z87891 Personal history of nicotine dependence: Secondary | ICD-10-CM | POA: Diagnosis not present

## 2024-05-24 DIAGNOSIS — R161 Splenomegaly, not elsewhere classified: Secondary | ICD-10-CM | POA: Diagnosis not present

## 2024-05-24 LAB — URINALYSIS, ROUTINE W REFLEX MICROSCOPIC
Bilirubin Urine: NEGATIVE
Glucose, UA: NEGATIVE mg/dL
Ketones, ur: NEGATIVE mg/dL
Leukocytes,Ua: NEGATIVE
Nitrite: NEGATIVE
Protein, ur: 100 mg/dL — AB
RBC / HPF: >50 RBC/hpf (ref 0–5)
Specific Gravity, Urine: 1.028 (ref 1.005–1.030)
pH: 5 (ref 5.0–8.0)

## 2024-05-24 LAB — COMPREHENSIVE METABOLIC PANEL WITH GFR
ALT: 30 U/L (ref 0–44)
AST: 23 U/L (ref 15–41)
Albumin: 4.1 g/dL (ref 3.5–5.0)
Alkaline Phosphatase: 69 U/L (ref 38–126)
Anion gap: 8 (ref 5–15)
BUN: 15 mg/dL (ref 6–20)
CO2: 24 mmol/L (ref 22–32)
Calcium: 8.7 mg/dL — ABNORMAL LOW (ref 8.9–10.3)
Chloride: 102 mmol/L (ref 98–111)
Creatinine, Ser: 1.45 mg/dL — ABNORMAL HIGH (ref 0.61–1.24)
GFR, Estimated: 57 mL/min — ABNORMAL LOW (ref >60)
Glucose, Bld: 124 mg/dL — ABNORMAL HIGH (ref 70–99)
Potassium: 3.8 mmol/L (ref 3.5–5.1)
Sodium: 134 mmol/L — ABNORMAL LOW (ref 135–145)
Total Bilirubin: 0.9 mg/dL (ref 0.0–1.2)
Total Protein: 7.2 g/dL (ref 6.5–8.1)

## 2024-05-24 LAB — CBC WITH DIFFERENTIAL/PLATELET
Abs Immature Granulocytes: 0.03 10*3/uL (ref 0.00–0.07)
Basophils Absolute: 0.1 10*3/uL (ref 0.0–0.1)
Basophils Relative: 1 %
Eosinophils Absolute: 0.5 10*3/uL (ref 0.0–0.5)
Eosinophils Relative: 4 %
HCT: 44.5 % (ref 39.0–52.0)
Hemoglobin: 15.8 g/dL (ref 13.0–17.0)
Immature Granulocytes: 0 %
Lymphocytes Relative: 22 %
Lymphs Abs: 2.3 10*3/uL (ref 0.7–4.0)
MCH: 32.8 pg (ref 26.0–34.0)
MCHC: 35.5 g/dL (ref 30.0–36.0)
MCV: 92.5 fL (ref 80.0–100.0)
Monocytes Absolute: 1.1 10*3/uL — ABNORMAL HIGH (ref 0.1–1.0)
Monocytes Relative: 11 %
Neutro Abs: 6.5 10*3/uL (ref 1.7–7.7)
Neutrophils Relative %: 62 %
RBC: 4.81 MIL/uL (ref 4.22–5.81)
RDW: 12.3 % (ref 11.5–15.5)
WBC: 10.4 10*3/uL (ref 4.0–10.5)
nRBC: 0 % (ref 0.0–0.2)

## 2024-05-24 MED ORDER — MORPHINE SULFATE (PF) 4 MG/ML IV SOLN
4.0000 mg | Freq: Once | INTRAVENOUS | Status: AC
  Administered 2024-05-24: 4 mg via INTRAVENOUS
  Filled 2024-05-24: qty 1

## 2024-05-24 MED ORDER — HYDROCODONE-ACETAMINOPHEN 5-325 MG PO TABS: 1.0000 | ORAL_TABLET | ORAL | 0 refills | Status: DC | PRN

## 2024-05-24 MED ORDER — KETOROLAC TROMETHAMINE 15 MG/ML IJ SOLN
15.0000 mg | Freq: Once | INTRAMUSCULAR | Status: AC
  Administered 2024-05-24: 15 mg via INTRAVENOUS
  Filled 2024-05-24: qty 1

## 2024-05-24 MED ORDER — TAMSULOSIN HCL 0.4 MG PO CAPS
0.4000 mg | ORAL_CAPSULE | Freq: Once | ORAL | Status: AC
  Administered 2024-05-24: 0.4 mg via ORAL
  Filled 2024-05-24: qty 1

## 2024-05-24 MED ORDER — NAPROXEN 375 MG PO TABS: 375.0000 mg | ORAL_TABLET | Freq: Two times a day (BID) | ORAL | 0 refills | Status: DC

## 2024-05-24 MED ORDER — TAMSULOSIN HCL 0.4 MG PO CAPS: 0.4000 mg | ORAL_CAPSULE | Freq: Every day | ORAL | 0 refills | Status: AC

## 2024-05-24 MED ORDER — ONDANSETRON 4 MG PO TBDP: 4.0000 mg | ORAL_TABLET | Freq: Three times a day (TID) | ORAL | 0 refills | Status: DC | PRN

## 2024-05-24 MED ORDER — ONDANSETRON HCL 4 MG/2ML IJ SOLN
4.0000 mg | Freq: Once | INTRAMUSCULAR | Status: AC
  Administered 2024-05-24: 4 mg via INTRAVENOUS
  Filled 2024-05-24: qty 2

## 2024-05-24 MED ORDER — LACTATED RINGERS IV BOLUS
1000.0000 mL | Freq: Once | INTRAVENOUS | Status: AC
  Administered 2024-05-24: 1000 mL via INTRAVENOUS

## 2024-05-24 NOTE — ED Triage Notes (Signed)
 Patient arrived with complaints of left flank pain that started on Saturday, some blood noted. Hx of a kidney stone in the past.

## 2024-05-24 NOTE — ED Provider Notes (Signed)
 [facility] EMERGENCY DEPARTMENT AT [facility] Provider Note  CSN: [id number] Arrival date & time: 05/24/24 0415  Chief Complaint(s) Flank Pain  HPI Shane Saunders is a 53 y.o. male with PMH HTN, HLD, OSA on CPAP, previous nephrolithiasis who presents emergency room for evaluation of left flank pain.  States that symptoms began 4 days ago but significant worsened early this morning.  Denies associated chest pain, shortness of breath, headache, fever or other systemic symptoms.  States has been able to urinate without dysuria.   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   Morbid obesity (HCC)    Obesity (BMI 30-39.9)    OSA on CPAP    Prediabetes    Right ureteral stone    Sleep apnea    C PAP   Vertigo    Vitamin D  deficiency    Wears glasses    Patient Active Problem List   Diagnosis Date Noted   Orthostatic hypotension 09/10/2020   Obstructive sleep apnea hypopnea, severe 03/16/2020   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   Excessive daytime sleepiness 02/13/2020   Chronic fatigue 02/13/2020   BPH with obstruction/lower urinary tract symptoms    Prediabetes 10/11/2018   Former smoker 10/11/2018   Family history of ischemic heart disease 10/11/2018   FH: hypertension 03/23/2018   Crushing injury of left elbow and forearm 07/13/2015   Depression, major, in partial remission (HCC) 07/12/2015   Medication management 02/08/2014   Essential hypertension    Hyperlipidemia, mixed    OSA on CPAP    Abnormal glucose    Morbid obesity (HCC)    Vitamin D  deficiency    Home Medication(s) Prior to Admission medications    Medication Sig Start Date End Date Taking? Authorizing Provider  atorvastatin  (LIPITOR) 80 MG tablet TAKE 1 TABLET BY MOUTH EVERY DAY FOR CHOLESTEROL 08/12/21   Brandon Bureau, NP  azelastine  (ASTELIN ) 0.1 % nasal spray     [provider]  buPROPion  (WELLBUTRIN  XL) 300 MG 24 hr tablet Take 1 tablet every Morning for Mood, Focus & Concentration 03/07/21   [place] Bureau, NP  Cholecalciferol (VITAMIN D3) 250 MCG (10000 UT) TABS Take 10,000 Units by mouth daily.    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, congenital)   ARTERY REPAIR Right 05/31/2014   Procedure: IRRIGATION, EXPLORATION, AND REPAIR OF RIGHT ARM WOUND;  Surgeon: Diantha Fossa, MD;  Location: [facility] OR;  Service: General;  Laterality: Right;   COLONOSCOPY     CYSTOSCOPY WITH RETROGRADE PYELOGRAM, URETEROSCOPY AND STENT PLACEMENT Right 07/11/2016   Procedure: CYSTOSCOPY WITH RETROGRADE PYELOGRAM, URETEROSCOPY, BASKET STONE EXTRACTION  AND STENT PLACEMENT;  Surgeon: Osborn Blaze, MD;  Location: [facility];  Service: Urology;  Laterality: Right;  45 MINS  C-ARM DIGITAL URETEROSCOPE HOLMIUM LASER   ESOPHAGOGASTRODUODENOSCOPY     Family History Family History  Problem Relation Age of Onset   Hypertension Mother    Migraines Mother    Thyroid  disease Mother    Hypertension Father     Hyperlipidemia Father    Diabetes Father    Cancer Sister        thyroid    CAD Paternal Grandfather    Prostate cancer Paternal Uncle    Colon cancer Neg Hx    Rectal cancer Neg Hx    Stomach cancer Neg Hx     Social History Social History   Tobacco Use   Smoking status: Former    Current packs/day: 0.00    Average packs/day: 0.5 packs/day for 10.0 years (5.0 ttl pk-yrs)    Types: Cigarettes    Start date: 10/28/1999    Quit date: 10/27/2009    Years since quitting: 14.5   Smokeless tobacco: Former    Types: Snuff  Vaping Use   Vaping status: Never Used  Substance Use Topics   Alcohol use: Not Currently    Comment: occasional   Drug use: No   Allergies Codeine, Effexor [venlafaxine], Prednisone , Tramadol  hcl, and Zinc   Review of Systems Review of Systems  Genitourinary:  Positive for flank pain.    Physical Exam Vital Signs  I have reviewed the triage vital signs BP (!) 139/95   Pulse 67   Temp (!) 96.7 F (35.9 C) (Axillary)   Resp 20   SpO2 100%   Physical Exam Constitutional:      General: He is not in acute distress.    Appearance: Normal appearance.  HENT:     Head: Normocephalic and atraumatic.     Nose: No congestion or rhinorrhea.  Eyes:     General:        Right eye: No discharge.        Left eye: No discharge.     Extraocular Movements: Extraocular movements intact.     Pupils: Pupils are equal, round, and reactive to light.  Cardiovascular:     Rate and Rhythm: Normal rate and regular rhythm.     Heart sounds: No murmur heard. Pulmonary:     Effort: No respiratory distress.     Breath sounds: No wheezing or rales.  Abdominal:     General: There is no distension.     Tenderness: There is no abdominal tenderness. There is left CVA tenderness.  Musculoskeletal:        General: Normal range of motion.     Cervical back: Normal range of motion.  Skin:    General: Skin is warm and dry.  Neurological:     General: No focal deficit  present.     Mental Status: He is alert.     ED  Results and Treatments Labs (all labs ordered are listed, but only abnormal results are displayed) Labs Reviewed  URINALYSIS, ROUTINE W REFLEX MICROSCOPIC  COMPREHENSIVE METABOLIC PANEL WITH GFR  CBC WITH DIFFERENTIAL/PLATELET                                                                                                                          Radiology No results found.  Pertinent labs & imaging results that were available during my care of the patient were reviewed by me and considered in my medical decision making (see MDM for details).  Medications Ordered in ED Medications - No data to display                                                                                                                                   Procedures Procedures  (including critical care time)  Medical Decision Making / ED Course   This patient presents to the ED for concern of flank pain, this involves an extensive number of treatment options, and is a complaint that carries with it a high risk of complications and morbidity.  The differential diagnosis includes nephrolithiasis, pyelonephritis, obstruction, AAA, musculoskeletal strain, vertebral fracture, intra-abdominal abscess, diverticulitis  MDM: Patient seen emergency room for evaluation of flank pain.  Physical exam with left CVA tenderness to palpation but is otherwise unremarkable.  Laboratory evaluation with a mild creatinine elevation of 1.45 but is otherwise unremarkable.  Urinalysis with hematuria but no convincing evidence of infection.  CT stone study with a 2 mm distal stone with bilateral perinephric stranding left greater than right.  Patient pain controlled, given fluid resuscitation and started on Flomax .  Symptoms improved after pain control.  At this time he does not meet inpatient criteria for admission and will be discharged with outpatient follow-up.  Return precautions  given of which he voiced understanding.   Additional history obtained: -Additional history obtained from wife -External records from outside source obtained and reviewed including: Chart review including previous notes, labs, imaging, consultation notes   Lab Tests: -I ordered, reviewed, and interpreted labs.   The pertinent results include:   Labs Reviewed  URINALYSIS, ROUTINE W REFLEX MICROSCOPIC  COMPREHENSIVE METABOLIC PANEL WITH GFR  CBC WITH DIFFERENTIAL/PLATELET     Imaging Studies ordered: I ordered imaging studies including CT stone study I independently visualized and interpreted imaging. I agree with the radiologist interpretation  Medicines ordered and prescription drug management: No orders of the defined types were placed in this encounter.   -I have reviewed the patients home medicines and have made adjustments as needed  Critical interventions none    Cardiac Monitoring: The patient was maintained on a cardiac monitor.  I personally viewed and interpreted the cardiac monitored which showed an underlying rhythm of: NSRT  Social Determinants of Health:  Factors impacting patients care include: none   Reevaluation: After the interventions noted above, I reevaluated the patient and found that they have :improved  Co morbidities that complicate the patient evaluation  Past Medical History:  Diagnosis Date   Abnormal glucose    Borderline hypertension    BPH (benign prostatic hypertrophy)    Crushing injury of left elbow and forearm 07/13/2015   Depression, major, in partial remission (HCC) 07/12/2015   Dizziness    Hyperlipidemia    Hypertension    Hypogonadism male    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

## 2024-05-30 DIAGNOSIS — G43719 Chronic migraine without aura, intractable, without status migrainosus: Secondary | ICD-10-CM | POA: Diagnosis not present

## 2024-05-30 DIAGNOSIS — G4486 Cervicogenic headache: Secondary | ICD-10-CM | POA: Diagnosis not present

## 2024-05-30 DIAGNOSIS — I10 Essential (primary) hypertension: Secondary | ICD-10-CM | POA: Diagnosis not present

## 2024-05-30 DIAGNOSIS — G4733 Obstructive sleep apnea (adult) (pediatric): Secondary | ICD-10-CM | POA: Diagnosis not present

## 2024-06-01 DIAGNOSIS — R454 Irritability and anger: Secondary | ICD-10-CM | POA: Diagnosis not present

## 2024-06-01 DIAGNOSIS — F331 Major depressive disorder, recurrent, moderate: Secondary | ICD-10-CM | POA: Diagnosis not present

## 2024-06-01 DIAGNOSIS — F411 Generalized anxiety disorder: Secondary | ICD-10-CM | POA: Diagnosis not present

## 2024-06-03 DIAGNOSIS — N132 Hydronephrosis with renal and ureteral calculous obstruction: Secondary | ICD-10-CM | POA: Diagnosis not present

## 2024-06-06 DIAGNOSIS — I739 Peripheral vascular disease, unspecified: Secondary | ICD-10-CM | POA: Diagnosis not present

## 2024-06-06 DIAGNOSIS — R1010 Upper abdominal pain, unspecified: Secondary | ICD-10-CM | POA: Diagnosis not present

## 2024-06-08 DIAGNOSIS — E785 Hyperlipidemia, unspecified: Secondary | ICD-10-CM | POA: Diagnosis not present

## 2024-06-08 DIAGNOSIS — Z6837 Body mass index (BMI) 37.0-37.9, adult: Secondary | ICD-10-CM | POA: Diagnosis not present

## 2024-06-08 DIAGNOSIS — G4733 Obstructive sleep apnea (adult) (pediatric): Secondary | ICD-10-CM | POA: Diagnosis not present

## 2024-06-08 DIAGNOSIS — E669 Obesity, unspecified: Secondary | ICD-10-CM | POA: Diagnosis not present

## 2024-06-13 DIAGNOSIS — M47816 Spondylosis without myelopathy or radiculopathy, lumbar region: Secondary | ICD-10-CM | POA: Diagnosis not present

## 2024-06-21 DIAGNOSIS — R140 Abdominal distension (gaseous): Secondary | ICD-10-CM | POA: Diagnosis not present

## 2024-06-21 DIAGNOSIS — R1084 Generalized abdominal pain: Secondary | ICD-10-CM | POA: Diagnosis not present

## 2024-06-21 DIAGNOSIS — I10 Essential (primary) hypertension: Secondary | ICD-10-CM | POA: Diagnosis not present

## 2024-06-21 DIAGNOSIS — K219 Gastro-esophageal reflux disease without esophagitis: Secondary | ICD-10-CM | POA: Diagnosis not present

## 2024-06-21 DIAGNOSIS — K5900 Constipation, unspecified: Secondary | ICD-10-CM | POA: Diagnosis not present

## 2024-06-27 DIAGNOSIS — J019 Acute sinusitis, unspecified: Secondary | ICD-10-CM | POA: Diagnosis not present

## 2024-07-02 ENCOUNTER — Encounter (HOSPITAL_BASED_OUTPATIENT_CLINIC_OR_DEPARTMENT_OTHER): Payer: Self-pay

## 2024-07-02 ENCOUNTER — Other Ambulatory Visit: Payer: Self-pay

## 2024-07-02 ENCOUNTER — Emergency Department (HOSPITAL_BASED_OUTPATIENT_CLINIC_OR_DEPARTMENT_OTHER)
Admission: EM | Admit: 2024-07-02 | Discharge: 2024-07-02 | Disposition: A | Discharge status: Home / Self Care | Attending: Emergency Medicine | Admitting: Emergency Medicine

## 2024-07-02 DIAGNOSIS — Z79899 Other long term (current) drug therapy: Secondary | ICD-10-CM | POA: Diagnosis not present

## 2024-07-02 DIAGNOSIS — Z23 Encounter for immunization: Secondary | ICD-10-CM | POA: Diagnosis not present

## 2024-07-02 DIAGNOSIS — S61213A Laceration without foreign body of left middle finger without damage to nail, initial encounter: Secondary | ICD-10-CM | POA: Diagnosis not present

## 2024-07-02 DIAGNOSIS — W268XXA Contact with other sharp object(s), not elsewhere classified, initial encounter: Secondary | ICD-10-CM | POA: Insufficient documentation

## 2024-07-02 DIAGNOSIS — S6992XA Unspecified injury of left wrist, hand and finger(s), initial encounter: Secondary | ICD-10-CM | POA: Diagnosis not present

## 2024-07-02 DIAGNOSIS — I10 Essential (primary) hypertension: Secondary | ICD-10-CM | POA: Insufficient documentation

## 2024-07-02 MED ORDER — LIDOCAINE HCL (PF) 1 % IJ SOLN
5.0000 mL | Freq: Once | INTRAMUSCULAR | Status: AC
  Administered 2024-07-02: 5 mL
  Filled 2024-07-02: qty 5

## 2024-07-02 MED ORDER — TETANUS-DIPHTH-ACELL PERTUSSIS 5-2.5-18.5 LF-MCG/0.5 IM SUSY
0.5000 mL | PREFILLED_SYRINGE | Freq: Once | INTRAMUSCULAR | Status: AC
  Administered 2024-07-02: 0.5 mL via INTRAMUSCULAR
  Filled 2024-07-02: qty 0.5

## 2024-07-02 NOTE — ED Provider Notes (Signed)
 [place] EMERGENCY DEPARTMENT AT [facility] Provider Note   CSN: [id number] Arrival date & time: 07/02/24  9088     Patient presents with: Laceration   Shane Saunders is a 53 y.o. male.    Laceration    Patient has a history of hyperlipidemia hypertension prediabetes who presents ED for finger injury.  Patient states he was using a razor blade when he accidentally cut across the distal aspect of the volar surface of the middle finger.  Patient states he was not able to get the bleeding to stop.  He denies any numbness or weakness no other injuries.  Prior to Admission medications   Medication Sig Start Date End Date Taking? Authorizing Provider  ALPRAZolam  (XANAX ) 0.5 MG tablet Take 1 tablet by mouth 2 (two) times daily as needed.    [provider]  atorvastatin  (LIPITOR) 80 MG tablet TAKE 1 TABLET BY MOUTH EVERY DAY FOR CHOLESTEROL 08/12/21   Jeanine Knee, NP  azelastine  (ASTELIN ) 0.1 % nasal spray Place 1 spray into both nostrils 2 (two) times daily as needed for allergies.    [provider]  buPROPion  (WELLBUTRIN  XL) 300 MG 24 hr tablet Take 1 tablet every Morning for Mood, Focus & Concentration 03/07/21   Jeanine Knee, NP  Cholecalciferol (VITAMIN D3) 250 MCG (10000 UT) TABS Take 10,000 Units by mouth daily.    [provider]  escitalopram  (LEXAPRO ) 20 MG tablet Take  1 tablet  Daily  for Mood Patient taking differently: Take 10 mg by mouth at bedtime. 05/28/21   Tonita Fallow, MD  fexofenadine  (ALLEGRA ) 60 MG tablet Takes 1 tablet 2 x /day as needed for Allergies Patient taking differently: Take 60 mg by mouth daily. 03/21/20   Tonita Fallow, MD  HYDROcodone -acetaminophen  (NORCO/VICODIN) 5-325 MG tablet Take 1 tablet by mouth every 4 (four) hours as needed. 05/24/24   Kommor, Madison, MD  hydrOXYzine  (ATARAX ) 25 MG tablet Take 25-50 mg by mouth at bedtime as needed for anxiety or itching.    [provider]  metoprolol   succinate (TOPROL -XL) 25 MG 24 hr tablet Take 1 tablet by mouth daily. 08/13/21   [provider]  naproxen  (NAPROSYN ) 375 MG tablet Take 1 tablet (375 mg total) by mouth 2 (two) times daily. 05/24/24   Kommor, Madison, MD  omeprazole  (PRILOSEC) 40 MG capsule TAKE 1 CAPSULE BY MOUTH EVERY DAY BEFORE BREAKFAST 02/03/22   Kerman Vina HERO, NP  ondansetron  (ZOFRAN -ODT) 4 MG disintegrating tablet Take 1 tablet (4 mg total) by mouth every 8 (eight) hours as needed for nausea or vomiting. 05/24/24   Kommor, Madison, MD    Allergies: Codeine, Effexor [venlafaxine], Prednisone , Tramadol  hcl, and Zinc     Review of Systems  Updated Vital Signs BP (!) 140/85   Pulse 60   Temp 97.8 F (36.6 C) (Oral)   Resp 16   Ht 1.778 m (5' 10)   Wt 117.9 kg   SpO2 97%   BMI 37.31 kg/m   Physical Exam Vitals and nursing note reviewed.  Constitutional:      General: He is not in acute distress.    Appearance: He is well-developed.  HENT:     Head: Normocephalic and atraumatic.     Right Ear: External ear normal.     Left Ear: External ear normal.  Eyes:     General: No scleral icterus.       Right eye: No discharge.        Left eye: No discharge.  Conjunctiva/sclera: Conjunctivae normal.  Neck:     Trachea: No tracheal deviation.  Cardiovascular:     Rate and Rhythm: Normal rate.  Pulmonary:     Effort: Pulmonary effort is normal. No respiratory distress.     Breath sounds: No stridor.  Abdominal:     General: There is no distension.  Musculoskeletal:        General: No swelling or deformity.     Cervical back: Neck supple.     Comments: L-shaped laceration along the distal aspect of his left middle finger, bleeding stopped with compression, no pulsatile flow, neurovascularly intact  Skin:    General: Skin is warm and dry.     Findings: No rash.  Neurological:     Mental Status: He is alert. Mental status is at baseline.     Cranial Nerves: No dysarthria or facial asymmetry.      Motor: No seizure activity.     (all labs ordered are listed, but only abnormal results are displayed) Labs Reviewed - No data to display  EKG: None  Radiology: No results found.   .Laceration Repair  Date/Time: 07/02/2024 10:06 AM  Performed by: Randol Simmonds, MD Authorized by: Randol Simmonds, MD   Consent:    Consent obtained:  Verbal   Consent given by:  Patient   Risks, benefits, and alternatives were discussed: yes   Universal protocol:    Patient identity confirmed:  Hospital-assigned identification number Anesthesia:    Anesthesia method:  Local infiltration   Local anesthetic:  Lidocaine  1% w/o epi Laceration details:    Location:  Finger   Length (cm):  2.5 Pre-procedure details:    Preparation:  Patient was prepped and draped in usual sterile fashion Exploration:    Wound extent: no foreign body, no signs of injury, no tendon damage, no underlying fracture and no vascular damage     Contaminated: no   Treatment:    Area cleansed with:  Shur-Clens   Amount of cleaning:  Standard   Irrigation method:  Pressure wash   Visualized foreign bodies/material removed: no   Skin repair:    Repair method:  Sutures   Suture size:  5-0   Suture material:  Prolene   Suture technique:  Simple interrupted   Number of sutures:  5 Approximation:    Approximation:  Close Repair type:    Repair type:  Simple Post-procedure details:    Dressing:  Non-adherent dressing and sterile dressing   Procedure completion:  Tolerated with difficulty    Medications Ordered in the ED  lidocaine  (PF) (XYLOCAINE ) 1 % injection 5 mL (has no administration in time range)  Tdap (BOOSTRIX ) injection 0.5 mL (has no administration in time range)                                    Medical Decision Making Risk Prescription drug management.   Patient presented with a laceration to the fingertip.  Patient neurovascularly intact.  Wound was irrigated thoroughly.  Wound closure obtained with 5  simple interrupted sutures.  Evaluation and diagnostic testing in the emergency department does not suggest an emergent condition requiring admission or immediate intervention beyond what has been performed at this time.  The patient is safe for discharge and has been instructed to return immediately for worsening symptoms, change in symptoms or any other concerns.      Final diagnoses:  Laceration of left middle  finger without foreign body without damage to nail, initial encounter    ED Discharge Orders     None          Randol Simmonds, MD 07/02/24 1007

## 2024-07-02 NOTE — Discharge Instructions (Addendum)
 Apply over-the-counter antibiotic ointment to the wound daily.  Change your dressing daily.  Suture removal recommended in approximately 7 to 10 days  You were given a tetanus shot in the ED today.  Booster shots are recommended every 10 years

## 2024-07-02 NOTE — ED Triage Notes (Signed)
 Lac to left middle finger. Cut with razor blade approx 30 PTA. Tdap unknown.

## 2024-07-04 DIAGNOSIS — N201 Calculus of ureter: Secondary | ICD-10-CM | POA: Diagnosis not present

## 2024-07-06 DIAGNOSIS — M5416 Radiculopathy, lumbar region: Secondary | ICD-10-CM | POA: Diagnosis not present

## 2024-07-07 DIAGNOSIS — F411 Generalized anxiety disorder: Secondary | ICD-10-CM | POA: Diagnosis not present

## 2024-07-07 DIAGNOSIS — Z6837 Body mass index (BMI) 37.0-37.9, adult: Secondary | ICD-10-CM | POA: Diagnosis not present

## 2024-07-07 DIAGNOSIS — F331 Major depressive disorder, recurrent, moderate: Secondary | ICD-10-CM | POA: Diagnosis not present

## 2024-07-07 DIAGNOSIS — E785 Hyperlipidemia, unspecified: Secondary | ICD-10-CM | POA: Diagnosis not present

## 2024-07-07 DIAGNOSIS — R454 Irritability and anger: Secondary | ICD-10-CM | POA: Diagnosis not present

## 2024-07-07 DIAGNOSIS — G4733 Obstructive sleep apnea (adult) (pediatric): Secondary | ICD-10-CM | POA: Diagnosis not present

## 2024-07-07 DIAGNOSIS — E669 Obesity, unspecified: Secondary | ICD-10-CM | POA: Diagnosis not present

## 2024-07-11 DIAGNOSIS — F331 Major depressive disorder, recurrent, moderate: Secondary | ICD-10-CM | POA: Diagnosis not present

## 2024-07-11 DIAGNOSIS — Z4802 Encounter for removal of sutures: Secondary | ICD-10-CM | POA: Diagnosis not present

## 2024-07-11 DIAGNOSIS — R454 Irritability and anger: Secondary | ICD-10-CM | POA: Diagnosis not present

## 2024-07-11 DIAGNOSIS — X58XXXA Exposure to other specified factors, initial encounter: Secondary | ICD-10-CM | POA: Diagnosis not present

## 2024-07-11 DIAGNOSIS — S61213A Laceration without foreign body of left middle finger without damage to nail, initial encounter: Secondary | ICD-10-CM | POA: Diagnosis not present

## 2024-07-11 DIAGNOSIS — M47816 Spondylosis without myelopathy or radiculopathy, lumbar region: Secondary | ICD-10-CM | POA: Diagnosis not present

## 2024-07-11 DIAGNOSIS — F411 Generalized anxiety disorder: Secondary | ICD-10-CM | POA: Diagnosis not present

## 2024-07-11 DIAGNOSIS — M5416 Radiculopathy, lumbar region: Secondary | ICD-10-CM | POA: Diagnosis not present

## 2024-07-14 DIAGNOSIS — Z1330 Encounter for screening examination for mental health and behavioral disorders, unspecified: Secondary | ICD-10-CM | POA: Diagnosis not present

## 2024-07-14 DIAGNOSIS — M5416 Radiculopathy, lumbar region: Secondary | ICD-10-CM | POA: Diagnosis not present

## 2024-07-14 DIAGNOSIS — M47816 Spondylosis without myelopathy or radiculopathy, lumbar region: Secondary | ICD-10-CM | POA: Diagnosis not present

## 2024-07-14 DIAGNOSIS — M542 Cervicalgia: Secondary | ICD-10-CM | POA: Diagnosis not present

## 2024-07-14 DIAGNOSIS — I10 Essential (primary) hypertension: Secondary | ICD-10-CM | POA: Diagnosis not present

## 2024-07-14 NOTE — Progress Notes (Signed)
 Subjective:    Shane Saunders is a 53 y.o. (DOB 06-12-1971) male.  No chief complaint on file.    HPI 53 year old male with some chronic low back pain.  Previously had been having some back pain only but now has been having some increasing radicular symptoms left greater than right in the S1 distribution.  Pain really increased after working on his house.  No fall or injury.  He states that twisting and bending worsen his pain.  His pain is worse with prolonged standing and walking.  He has been using Advil and ice and heat.  He has done physical therapy in the past for his low back but nothing recently.  He had a Toradol  injection as well which did help improve some of his symptoms.  He is disabled due to migraines and vertigo.  Lumbar MRI shows spondylosis at L4-5 and L5-S1 with mild bilateral L5 foraminal stenosis. He is mostly been suffering from facet related pain across the lumbar spine.  He had been provide lifestyle education regarding diet and weight loss and anti-inflammatory diet.  Right L345 RFA on 05/17/2024 with about 90% sustained improvement in his right mechanical low back pain.  These results have tapered off somewhat but he still having at least greater than 50% relief of the axial mechanical pain. However, he was having increasing radicular pain into bilateral lower extremities.  He was also started on a low-dose pregabalin at 25 mg nightly.  B L5 SNRB 07/06/24 with about 2 days worth of good relief of his symptoms but then returned without significant benefit.  He returned just about 5 days after his injection reporting no improvement and encouraged him just to give it more time.  However he called again this morning reporting his pain is actually worse than it was now even before the injection. He reports that the visit this afternoon however that he went to see his primary care office this morning and it sounds as though he received a Toradol  injection and has been given a  prednisone  taper.  He is going to be starting that prednisone  today.  He is complaining of severe what sounds like neuropathic symptoms in the bilateral lower legs and also having similar symptoms now into his arms hands and fingers. Cervical x-rays today showed no acute findings.  Maintained disc height with no listhesis.  No fractures masses or lesions noted.  Reviewed and updated this visit by provider: Tobacco  Allergies  Meds  Problems  Med Hx  Surg Hx  Fam Hx       Review of Systems  HENT:  Negative for dental problem.   Respiratory:  Negative for chest tightness and shortness of breath.   Cardiovascular:  Negative for chest pain.  Gastrointestinal:  Negative for abdominal pain.  Genitourinary:  Negative for difficulty urinating.  Musculoskeletal:  Positive for back pain and neck pain.      Objective:   Vitals:   07/14/24 1518  Height: 5' 10 (1.778 m)  Weight: 260 lb (117.9 kg)  BMI (Calculated): 37.3  PainSc:   4  PainLoc: Back   Physical Exam On examination, patient is alert and cooperative.  He is in no acute distress.  Ambulates into the office today bearing full weight without support device.  His gait is somewhat stiff and antalgic.  He is forward flexed across the lumbar spine.  Close examination of the low back reveals no breaks in the skin or evidence of infection, excess warmth erythema.  He  has some somewhat generalized tenderness to palpation along the paraspinal musculature in the thoracolumbar region.  Worse on the left actually than the right today.  Some mild localized tight muscular banding in the left upper lumbar paraspinals.  His lower extremities have decreased sensation to light touch somewhat in the mid shins distally worse in the lateral lower leg on the right in the L5/S1 distribution.  No muscular weakness however.  No breaks in the skin or evidence of any infection.  Negative Homans' sign.  Normal deep tendon reflexes.  Examining his cervical  spine he has some local tenderness in the paraspinals but no palpable step-offs.  He reports decreased sensation to light touch really in both hands.  However he has maintained grip strength.  Good distal pulses and capillary refill.  Some decreased range of motion of the cervical spine due to pain but otherwise intact.  Negative Spurling's.  Negative Hoffmann's.     Assessment / Plan:   Assessment 1. Cervicalgia (Primary) -     XR Spine  Cervical 2-3 Views 2. Lumbar radiculopathy -     pregabalin (LYRICA) 50 mg capsule; 1 capsule by mouth twice daily for 2 weeks, then 1 capsule by mouth three times daily, Normal     Plan  I reassured the patient think with the treatment of the recent Toradol  injection and prednisone  pack from his primary care provider he will start to see some relief of his pain in the next several days.  I do not see any need for injections at this point in time and I recommended tapering up his pregabalin.  He will move to a 50 mg twice daily dose for about 2 weeks and if needing and tolerating it well he can increase this to a 3 times daily dosing.  At this point think is also prudent to move forward with a NCV/EMG of lower extremities due to persisting symptoms and severe pain.  Return to the office in about 2 to 3 weeks for med check and reevaluation.  Risks, benefits, and alternatives of the medications and treatment plan prescribed today were discussed, and patient expressed understanding. Plan follow-up as discussed or as needed if any worsening symptoms or change in condition.    Total time spent with patient was greater than 30 minutes at today's visit, including history and physical exam, ordering medications, review of previous OV notes and imaging, review of advanced imaging, assessment, plan of care, and documentation.  Questions were encouraged and answered. Continue current medications as discussed above. Continue current HEP.

## 2024-07-18 DIAGNOSIS — R42 Dizziness and giddiness: Secondary | ICD-10-CM | POA: Diagnosis not present

## 2024-07-18 DIAGNOSIS — R002 Palpitations: Secondary | ICD-10-CM | POA: Diagnosis not present

## 2024-07-18 DIAGNOSIS — I10 Essential (primary) hypertension: Secondary | ICD-10-CM | POA: Diagnosis not present

## 2024-07-18 DIAGNOSIS — R0602 Shortness of breath: Secondary | ICD-10-CM | POA: Diagnosis not present

## 2024-07-26 ENCOUNTER — Emergency Department (HOSPITAL_COMMUNITY)

## 2024-07-26 ENCOUNTER — Emergency Department (HOSPITAL_COMMUNITY): Admission: EM | Admit: 2024-07-26 | Discharge: 2024-07-26 | Disposition: A | Discharge status: Home / Self Care

## 2024-07-26 ENCOUNTER — Other Ambulatory Visit: Payer: Self-pay

## 2024-07-26 ENCOUNTER — Encounter (HOSPITAL_COMMUNITY): Payer: Self-pay

## 2024-07-26 DIAGNOSIS — R0602 Shortness of breath: Secondary | ICD-10-CM | POA: Diagnosis not present

## 2024-07-26 DIAGNOSIS — Z87891 Personal history of nicotine dependence: Secondary | ICD-10-CM | POA: Insufficient documentation

## 2024-07-26 DIAGNOSIS — R600 Localized edema: Secondary | ICD-10-CM | POA: Diagnosis not present

## 2024-07-26 DIAGNOSIS — R002 Palpitations: Secondary | ICD-10-CM | POA: Diagnosis not present

## 2024-07-26 DIAGNOSIS — J9811 Atelectasis: Secondary | ICD-10-CM | POA: Diagnosis not present

## 2024-07-26 DIAGNOSIS — J984 Other disorders of lung: Secondary | ICD-10-CM | POA: Diagnosis not present

## 2024-07-26 DIAGNOSIS — R6883 Chills (without fever): Secondary | ICD-10-CM | POA: Diagnosis not present

## 2024-07-26 DIAGNOSIS — R7989 Other specified abnormal findings of blood chemistry: Secondary | ICD-10-CM | POA: Diagnosis not present

## 2024-07-26 DIAGNOSIS — I10 Essential (primary) hypertension: Secondary | ICD-10-CM | POA: Diagnosis not present

## 2024-07-26 DIAGNOSIS — R0902 Hypoxemia: Secondary | ICD-10-CM | POA: Diagnosis not present

## 2024-07-26 DIAGNOSIS — R202 Paresthesia of skin: Secondary | ICD-10-CM | POA: Diagnosis not present

## 2024-07-26 DIAGNOSIS — D696 Thrombocytopenia, unspecified: Secondary | ICD-10-CM | POA: Insufficient documentation

## 2024-07-26 LAB — CBC WITH DIFFERENTIAL/PLATELET
Abs Immature Granulocytes: 0.02 K/uL (ref 0.00–0.07)
Basophils Absolute: 0.1 K/uL (ref 0.0–0.1)
Basophils Relative: 1 %
Eosinophils Absolute: 0.3 K/uL (ref 0.0–0.5)
Eosinophils Relative: 4 %
HCT: 40.6 % (ref 39.0–52.0)
Hemoglobin: 14.5 g/dL (ref 13.0–17.0)
Immature Granulocytes: 0 %
Lymphocytes Relative: 18 %
Lymphs Abs: 1.1 K/uL (ref 0.7–4.0)
MCH: 32.2 pg (ref 26.0–34.0)
MCHC: 35.7 g/dL (ref 30.0–36.0)
MCV: 90.2 fL (ref 80.0–100.0)
Monocytes Absolute: 0.6 K/uL (ref 0.1–1.0)
Monocytes Relative: 9 %
Neutro Abs: 4.5 K/uL (ref 1.7–7.7)
Neutrophils Relative %: 68 %
Platelets: 53 K/uL — ABNORMAL LOW (ref 150–400)
RBC: 4.5 MIL/uL (ref 4.22–5.81)
RDW: 12.2 % (ref 11.5–15.5)
WBC: 6.5 K/uL (ref 4.0–10.5)
nRBC: 0 % (ref 0.0–0.2)

## 2024-07-26 LAB — COMPREHENSIVE METABOLIC PANEL WITH GFR
ALT: 26 U/L (ref 0–44)
AST: 22 U/L (ref 15–41)
Albumin: 3.9 g/dL (ref 3.5–5.0)
Alkaline Phosphatase: 62 U/L (ref 38–126)
Anion gap: 6 (ref 5–15)
BUN: 12 mg/dL (ref 6–20)
CO2: 27 mmol/L (ref 22–32)
Calcium: 8.9 mg/dL (ref 8.9–10.3)
Chloride: 104 mmol/L (ref 98–111)
Creatinine, Ser: 1.34 mg/dL — ABNORMAL HIGH (ref 0.61–1.24)
GFR, Estimated: >60 mL/min (ref >60)
Glucose, Bld: 155 mg/dL — ABNORMAL HIGH (ref 70–99)
Potassium: 3.9 mmol/L (ref 3.5–5.1)
Sodium: 137 mmol/L (ref 135–145)
Total Bilirubin: 0.9 mg/dL (ref 0.0–1.2)
Total Protein: 6.4 g/dL — ABNORMAL LOW (ref 6.5–8.1)

## 2024-07-26 LAB — BRAIN NATRIURETIC PEPTIDE: B Natriuretic Peptide: 7.7 pg/mL (ref 0.0–100.0)

## 2024-07-26 LAB — RESP PANEL BY RT-PCR (RSV, FLU A&B, COVID)  RVPGX2
Influenza A by PCR: NEGATIVE
Influenza B by PCR: NEGATIVE
Resp Syncytial Virus by PCR: NEGATIVE
SARS Coronavirus 2 by RT PCR: NEGATIVE

## 2024-07-26 LAB — D-DIMER, QUANTITATIVE: D-Dimer, Quant: <0.27 ug{FEU}/mL (ref 0.00–0.50)

## 2024-07-26 LAB — TROPONIN I (HIGH SENSITIVITY)
Troponin I (High Sensitivity): 3 ng/L (ref <18)
Troponin I (High Sensitivity): 3 ng/L (ref <18)

## 2024-07-26 NOTE — ED Triage Notes (Signed)
 C/O SHOB and states at home O2 was 84% on room air. C/O palpations for a week. Axox4. C/O nausea.

## 2024-07-26 NOTE — Discharge Instructions (Addendum)
 You were seen in the ER today for concerns of shortness of breath. Your labs and imaging were thankfully reassuring without any obvious abnormality seen. Your oxygen levels remained above 95% while you were in the ER. I am unsure the exact cause of the oxygen readings dropping while at your primary care providers office or at home. You may benefit from further testing with your primary care provider including vascular studies, as well as inflammatory markers (ESR, CRP, etc) or hormonal assessment (catecholarmines, etc). Please return to the ER for any concerns of new or worsening symptoms.

## 2024-07-26 NOTE — ED Notes (Signed)
 Patient ambulated, SpO2 remained at or above 95%

## 2024-07-26 NOTE — ED Notes (Signed)
 Pt transported to xray

## 2024-07-26 NOTE — ED Provider Notes (Signed)
 [place] EMERGENCY DEPARTMENT AT [facility] Provider Note   CSN: [id number] Arrival date & time: 07/26/24  0940     Patient presents with: Shortness of Breath   Shane Saunders is a 53 y.o. male.  Patient has history significant for hypertension, hyperlipidemia, morbid obesity, depression, former smoker presents emergency department concerns of shortness of breath and hypoxia.  Patient reportedly was seen at an equal clinic earlier in the past, for evaluation after concerns of having room oxygen levels at about 85%.  No history of COPD or any pulmonary abnormalities as far as he is aware.  He was recently switched from metoprolol  to amlodipine but moved back to metoprolol  after feeling that discontinuing his medication caused his heart to race.  He currently endorses chills but denies any fevers.  No sick contacts.  No reported cough, congestion, or sore throat.   Shortness of Breath      Prior to Admission medications   Medication Sig Start Date End Date Taking? Authorizing Provider  ALPRAZolam  (XANAX ) 0.5 MG tablet Take 1 tablet by mouth 2 (two) times daily as needed.    [provider]  atorvastatin  (LIPITOR) 80 MG tablet TAKE 1 TABLET BY MOUTH EVERY DAY FOR CHOLESTEROL 08/12/21   Jeanine Knee, NP  azelastine  (ASTELIN ) 0.1 % nasal spray Place 1 spray into both nostrils 2 (two) times daily as needed for allergies.    [provider]  buPROPion  (WELLBUTRIN  XL) 300 MG 24 hr tablet Take 1 tablet every Morning for Mood, Focus & Concentration 03/07/21   Jeanine Knee, NP  Cholecalciferol (VITAMIN D3) 250 MCG (10000 UT) TABS Take 10,000 Units by mouth daily.    [provider]  escitalopram  (LEXAPRO ) 20 MG tablet Take  1 tablet  Daily  for Mood Patient taking differently: Take 10 mg by mouth at bedtime. 05/28/21   Tonita Fallow, MD  fexofenadine  (ALLEGRA ) 60 MG tablet Takes 1 tablet 2 x /day as needed for Allergies Patient taking differently:  Take 60 mg by mouth daily. 03/21/20   Tonita Fallow, MD  HYDROcodone -acetaminophen  (NORCO/VICODIN) 5-325 MG tablet Take 1 tablet by mouth every 4 (four) hours as needed. 05/24/24   Kommor, Madison, MD  hydrOXYzine  (ATARAX ) 25 MG tablet Take 25-50 mg by mouth at bedtime as needed for anxiety or itching.    [provider]  metoprolol  succinate (TOPROL -XL) 25 MG 24 hr tablet Take 1 tablet by mouth daily. 08/13/21   [provider]  naproxen  (NAPROSYN ) 375 MG tablet Take 1 tablet (375 mg total) by mouth 2 (two) times daily. 05/24/24   Kommor, Madison, MD  omeprazole  (PRILOSEC) 40 MG capsule TAKE 1 CAPSULE BY MOUTH EVERY DAY BEFORE BREAKFAST 02/03/22   Kerman Vina HERO, NP  ondansetron  (ZOFRAN -ODT) 4 MG disintegrating tablet Take 1 tablet (4 mg total) by mouth every 8 (eight) hours as needed for nausea or vomiting. 05/24/24   Kommor, Madison, MD    Allergies: Codeine, Effexor [venlafaxine], Prednisone , Tramadol  hcl, and Zinc     Review of Systems  Respiratory:  Positive for shortness of breath.   All other systems reviewed and are negative.   Updated Vital Signs BP (!) 146/85 (BP Location: Left Arm)   Pulse 76   Temp 98.3 F (36.8 C) (Oral)   Resp 16   Ht 5' 10 (1.778 m)   Wt 115.7 kg   SpO2 96%   BMI 36.59 kg/m   Physical Exam Vitals and nursing note reviewed.  Constitutional:  General: He is not in acute distress.    Appearance: He is well-developed.  HENT:     Head: Normocephalic and atraumatic.  Eyes:     Conjunctiva/sclera: Conjunctivae normal.  Cardiovascular:     Rate and Rhythm: Normal rate and regular rhythm.     Heart sounds: No murmur heard. Pulmonary:     Effort: Pulmonary effort is normal. No respiratory distress.     Breath sounds: Normal breath sounds. No decreased breath sounds, wheezing, rhonchi or rales.  Abdominal:     Palpations: Abdomen is soft.     Tenderness: There is no abdominal tenderness.  Musculoskeletal:        General: No  swelling.     Cervical back: Neck supple.  Skin:    General: Skin is warm and dry.     Capillary Refill: Capillary refill takes less than 2 seconds.  Neurological:     Mental Status: He is alert.  Psychiatric:        Mood and Affect: Mood normal.     (all labs ordered are listed, but only abnormal results are displayed) Labs Reviewed  CBC WITH DIFFERENTIAL/PLATELET - Abnormal; Notable for the following components:      Result Value   Platelets 53 (*)    All other components within normal limits  COMPREHENSIVE METABOLIC PANEL WITH GFR - Abnormal; Notable for the following components:   Glucose, Bld 155 (*)    Creatinine, Ser 1.34 (*)    Total Protein 6.4 (*)    All other components within normal limits  RESP PANEL BY RT-PCR (RSV, FLU A&B, COVID)  RVPGX2  BRAIN NATRIURETIC PEPTIDE  D-DIMER, QUANTITATIVE  TROPONIN I (HIGH SENSITIVITY)  TROPONIN I (HIGH SENSITIVITY)    EKG: EKG Interpretation Date/Time:  Tuesday July 26 2024 09:49:27 EDT Ventricular Rate:  81 PR Interval:  172 QRS Duration:  78 QT Interval:  356 QTC Calculation: 413 R Axis:   76  Text Interpretation: Normal sinus rhythm When compared with ECG of 26-Jan-2024 18:35, PREVIOUS ECG IS PRESENT Confirmed by Ruthe Cornet [id number]) on 07/26/2024 9:52:28 AM  Radiology: [facility] Chest 2 View Result Date: 07/26/2024 CLINICAL DATA:  Shortness of breath. EXAM: CHEST - 2 VIEW COMPARISON:  07/18/2024. FINDINGS: The heart size and mediastinal contours are unchanged. Similar minimal left basilar linear scarring/atelectasis. No focal consolidation, pleural effusion, or pneumothorax. No acute osseous abnormality. IMPRESSION: Similar minimal left basilar linear scarring/atelectasis. No acute cardiopulmonary findings. Electronically Signed   By: Harrietta Sherry M.D.   On: 07/26/2024 11:35     Procedures   Medications Ordered in the ED - No data to display                                 Medical Decision Making Amount and/or  Complexity of Data Reviewed Labs: ordered.   This patient presents to the ED for concern of shortness of breath, this involves an extensive number of treatment options, and is a complaint that carries with it a high risk of complications and morbidity.  The differential diagnosis includes CHF, COPD, asthma, viral URI, pleural effusion   Co morbidities that complicate the patient evaluation  Obesity, former smoking history, hypertension, hyperlipoidemia   Lab Tests:  I Ordered, and personally interpreted labs.  The pertinent results include: CBC only concerning for thrombocytopenia.  To 53, CMP with creatinine 1.34 with no evident AKI, troponin unremarkable at 3, BNP normal at 7.7, D-dimer  negative   Imaging Studies ordered:  I ordered imaging studies including chest x-ray I independently visualized and interpreted imaging which showed similar minimal left basilar linear scarring/atelectasis. No acute cardiopulmonary findings. I agree with the radiologist interpretation   Cardiac Monitoring: / EKG:  The patient was maintained on a cardiac monitor.  I personally viewed and interpreted the cardiac monitored which showed an underlying rhythm of: sinus rhythm   Consultations Obtained:  I requested consultation with none,  and discussed lab and imaging findings as well as pertinent plan - they recommend: N/A   Problem List / ED Course / Critical interventions / Medication management  Patient past history significant for obesity, hypertension, hyperlipidemia, former smoker presents ED with concerns of shortness of breath.  Reports has been ongoing for several days with increased feelings of palpitations last week.  Reportedly had some hypoxia at home as well as while he was seen at the [facility] clinic with room air showing oxygen saturations in the middle to the upper and 80%.  Not currently on oxygen here at baseline and O2 remained stable between 93 to 96%.  Denies any recent fever, body  aches, or cough.  Does endorse some chills. On exam, patient is notably shivering.  Trace edema in bilateral lower extremities.  No significant unaware of heart or lung sounds that I could appreciate.  He is interacting and behaving appropriately at baseline.  Based on patient's reported hypoxia, will add on D-dimer for assessment possible PE given he was previously a smoker.  Not currently on blood thinners.  Troponin for evaluation of ACS.  BMP for evaluation of CHF. Basic labs are reassuring with only mild thrombocytopenia seen with platelet 53 but not largely typical for patient.  CMP with elevation creatinine 1.34 but otherwise unremarkable.  Troponin normal at 3, BNP unremarkable at 7.7, D-dimer is negative.  Respiratory panel negative. Workup is negative and patient has been able to ambulate without episodes of hypoxia. Unclear reason for hypoxia that was noted at PCP earlier. Patient has reported shivering at times but I could no appreciate significant clinical findings to suggest poor perfusion to the hands where O2 monitoring was occurring, doubtful of Raynaud's. I feel that he is clinically stable for outpatient follow up as he will likely need further testing with PCP. Discharged home in stable condition with strict return precautions verbalized. I have reviewed the patients home medicines and have made adjustments as needed   Social Determinants of Health:  None   Test / Admission - Considered:  Stable for discharge.  Final diagnoses:  Shortness of breath    ED Discharge Orders     None          Cecily Legrand LABOR, PA-C 07/26/24 2030    Neysa Caron PARAS, DO 07/26/24 2331

## 2024-08-01 DIAGNOSIS — I10 Essential (primary) hypertension: Secondary | ICD-10-CM | POA: Diagnosis not present

## 2024-08-01 DIAGNOSIS — M47816 Spondylosis without myelopathy or radiculopathy, lumbar region: Secondary | ICD-10-CM | POA: Diagnosis not present

## 2024-08-02 ENCOUNTER — Ambulatory Visit: Attending: Cardiology | Admitting: Cardiology

## 2024-08-02 ENCOUNTER — Encounter: Payer: Self-pay | Admitting: Cardiology

## 2024-08-02 VITALS — BP 114/78 | HR 68 | Ht 70.0 in | Wt 257.4 lb

## 2024-08-02 DIAGNOSIS — I951 Orthostatic hypotension: Secondary | ICD-10-CM | POA: Diagnosis not present

## 2024-08-02 DIAGNOSIS — G4733 Obstructive sleep apnea (adult) (pediatric): Secondary | ICD-10-CM

## 2024-08-02 DIAGNOSIS — I10 Essential (primary) hypertension: Secondary | ICD-10-CM | POA: Diagnosis not present

## 2024-08-02 DIAGNOSIS — E782 Mixed hyperlipidemia: Secondary | ICD-10-CM | POA: Diagnosis not present

## 2024-08-02 DIAGNOSIS — R0609 Other forms of dyspnea: Secondary | ICD-10-CM | POA: Insufficient documentation

## 2024-08-02 NOTE — Progress Notes (Unsigned)
 Cardiology Consultation:    Date:  08/02/2024   ID:  TASHON CAPP, DOB 10-05-1971, MRN [id number]  PCP:  Cleotilde, [place]  E, PA  Cardiologist:  Lamar Fitch, MD   Referring MD: Cleotilde, [place]  E, PA   No chief complaint on file.   History of Present Illness:    Shane Saunders is a 53 y.o. male who is being seen today for the evaluation of dyspnea and exertional chest pain at the request of Cleotilde, [place]  E, PA.  Complex past medical history.  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  Does have some depression essential hypertension dyslipidemia prediabetes severe obstructive sleep apnea comes today to be evaluated for dyspnea on exertion and tightness in the chest.  He said that sometimes when he walks he gets short of breath quite significantly he does have pulse oximeter and check pulse oximeter on the regular basis sometimes he will see drop to 87 sometimes at rest sometimes while walking.  Described to have shortness of breath as well as sometimes chest tightness.  That happen within the last 2 to 3 months and gradually getting worse.  He is not working he is disabled because of dizziness and vertigo that he has.  That is debilitating.  He is also treated for depression.  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   Morbid obesity (HCC)    Obesity (BMI 30-39.9)    OSA on CPAP     Prediabetes    Right ureteral stone    Sleep apnea    C PAP   Vertigo    Vitamin D  deficiency    Wears glasses     Past Surgical History:  Procedure Laterality Date   ABDOMINAL EXPLORATION SURGERY  1998   Celiotomy and Appendectomy /  (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, congenital)   ARTERY REPAIR Right 05/31/2014   Procedure: IRRIGATION, EXPLORATION, AND REPAIR OF RIGHT ARM WOUND;  Surgeon: Lynwood MALVA Pina, MD;  Location: [facility] OR;  Service: General;  Laterality: Right;   COLONOSCOPY     CYSTOSCOPY WITH RETROGRADE PYELOGRAM, URETEROSCOPY AND STENT PLACEMENT Right 07/11/2016   Procedure: CYSTOSCOPY WITH RETROGRADE PYELOGRAM, URETEROSCOPY, BASKET STONE EXTRACTION  AND STENT PLACEMENT;  Surgeon: Ricardo Likens, MD;  Location: [facility];  Service: Urology;  Laterality: Right;  45 MINS  C-ARM DIGITAL URETEROSCOPE HOLMIUM LASER   ESOPHAGOGASTRODUODENOSCOPY      Current Medications: Current Meds  Medication Sig   ALPRAZolam  (XANAX ) 0.5 MG tablet Take 1 tablet by mouth 2 (two) times daily as needed.   atorvastatin  (LIPITOR) 80 MG tablet TAKE 1 TABLET BY MOUTH EVERY DAY FOR CHOLESTEROL   azelastine  (ASTELIN ) 0.1 % nasal spray Place 1 spray into both nostrils 2 (two) times daily as needed for allergies.   Cholecalciferol (VITAMIN D3) 250 MCG (10000 UT) TABS Take 10,000 Units by mouth daily.  fexofenadine  (ALLEGRA ) 60 MG tablet Takes 1 tablet 2 x /day as needed for Allergies (Patient taking differently: Take 60 mg by mouth daily.)   HYDROcodone -acetaminophen  (NORCO/VICODIN) 5-325 MG tablet Take 1 tablet by mouth every 4 (four) hours as needed.   metoprolol  succinate (TOPROL -XL) 25 MG 24 hr tablet Take 1 tablet by mouth daily.   omeprazole  (PRILOSEC) 40 MG capsule TAKE 1 CAPSULE BY MOUTH EVERY DAY BEFORE BREAKFAST   ondansetron  (ZOFRAN -ODT) 4 MG disintegrating tablet Take 1 tablet (4 mg total) by mouth every 8 (eight) hours as needed for nausea or vomiting.     Allergies:    Codeine, Effexor [venlafaxine], Prednisone , Tramadol  hcl, and Zinc    Social History   Socioeconomic History   Marital status: Married    Spouse name: [name]   Number of children: 2   Years of education: 12   Highest education level: Not on file  Occupational History    Comment: na  Tobacco Use   Smoking status: Former    Current packs/day: 0.00    Average packs/day: 0.5 packs/day for 10.0 years (5.0 ttl pk-yrs)    Types: Cigarettes    Start date: 10/28/1999    Quit date: 10/27/2009    Years since quitting: 14.7   Smokeless tobacco: Former    Types: Snuff  Vaping Use   Vaping status: Never Used  Substance and Sexual Activity   Alcohol use: Not Currently    Comment: occasional   Drug use: No   Sexual activity: Yes    Comment: Vasectomy  Other Topics Concern   Not on file  Social History Narrative   Lives with wife   Caffeine -tea, 1 daily       Social Drivers of Health   Financial Resource Strain: Low Risk  (06/20/2024)   Received from [facility]   Overall Financial Resource Strain (CARDIA)    Difficulty of Paying Living Expenses: Not very hard  Food Insecurity: No Food Insecurity (06/20/2024)   Received [facility]   Hunger Vital Sign    Within the past 12 months, you worried that your food would run out before you got the money to buy more.: Never true    Within the past 12 months, the food you bought just didn't last and you didn't have money to get more.: Never true  Transportation Needs: No Transportation Needs (06/20/2024)   Received from [facility] - Transportation    Lack of Transportation (Medical): No    Lack of Transportation (Non-Medical): No  Physical Activity: Unknown (06/20/2024)   Received from [facility]   Exercise Vital Sign    On average, how many days per week do you engage in moderate to strenuous exercise (like a brisk walk)?: 0 days    Minutes of Exercise per Session: Not on file  Stress: Stress Concern Present  (06/20/2024)   Received from [facility] - Occupational Stress Questionnaire    Feeling of Stress : To some extent  Social Connections: Moderately Integrated (06/20/2024)   Received from [facility]   Social Network    How would you rate your social network (family, work, friends)?: Adequate participation with social networks     Family History: The patient's family history includes CAD in his paternal grandfather; Cancer in his sister; Diabetes in his father; Hyperlipidemia in his father; Hypertension in his father and mother; Migraines in his mother; Prostate cancer in his paternal uncle; Thyroid  disease in  his mother. There is no history of Colon cancer, Rectal cancer, or Stomach cancer. ROS:   Please see the history of present illness.    All 14 point review of systems negative except as described per history of present illness.  EKGs/Labs/Other Studies Reviewed:    The following studies were reviewed today:   EKG:       Recent Labs: 07/26/2024: ALT 26; B Natriuretic Peptide 7.7; BUN 12; Creatinine, Ser 1.34; Hemoglobin 14.5; Platelets 53; Potassium 3.9; Sodium 137  Recent Lipid Panel    Component Value Date/Time   CHOL 147 05/08/2021 1439   TRIG 98 05/08/2021 1439   HDL 40 05/08/2021 1439   CHOLHDL 3.7 05/08/2021 1439   VLDL 34 (H) 05/27/2017 1622   LDLCALC 88 05/08/2021 1439    Physical Exam:    VS:  BP 114/78   Pulse 68   Ht 5' 10 (1.778 m)   Wt 257 lb 6.4 oz (116.8 kg)   SpO2 94%   BMI 36.93 kg/m     Wt Readings from Last 3 Encounters:  08/02/24 257 lb 6.4 oz (116.8 kg)  07/26/24 255 lb (115.7 kg)  07/02/24 260 lb (117.9 kg)     GEN:  Well nourished, well developed in no acute distress HEENT: Normal NECK: No JVD; No carotid bruits LYMPHATICS: No lymphadenopathy CARDIAC: RRR, no murmurs, no rubs, no gallops RESPIRATORY:  Clear to auscultation without rales, wheezing or rhonchi  ABDOMEN: Soft, non-tender,  non-distended MUSCULOSKELETAL:  No edema; No deformity  SKIN: Warm and dry NEUROLOGIC:  Alert and oriented x 3 PSYCHIATRIC:  Normal affect   ASSESSMENT:    1. Essential hypertension   2. Orthostatic hypotension   3. Obstructive sleep apnea hypopnea, severe   4. OSA on CPAP   5. Hyperlipidemia, mixed   6. Dyspnea on exertion    PLAN:    In order of problems listed above:  Dyspnea on exertion will schedule him to have echocardiogram to assess left ventricular ejection fraction.  He also described to have some uneasy sensation in the chest while walking, therefore, we will schedule him to have Lexiscan . Obstructive sleep apnea follow-up by the internal medicine team. Essential hypertension blood pressure well-controlled we will continue present management. Dyslipidemia he is on atorvastatin  80 mg daily cholesterol excellent LDL 55 HDL of 33 continue present management. Overall there is a constellation of very atypical symptoms clearly depression and psychological issue playing a role here.  He can benefit from psychotherapy in my opinion.   Medication Adjustments/Labs and Tests Ordered: Current medicines are reviewed at length with the patient today.  Concerns regarding medicines are outlined above.  No orders of the defined types were placed in this encounter.  No orders of the defined types were placed in this encounter.   Signed, Lamar DOROTHA Fitch, MD, [facility]. 08/02/2024 10:30 AM    [facility]

## 2024-08-02 NOTE — Patient Instructions (Signed)
 Medication Instructions:  Your physician recommends that you continue on your current medications as directed. Please refer to the Current Medication list given to you today.  *If you need a refill on your cardiac medications before your next appointment, please call your pharmacy*   Lab Work: None Ordered If you have labs (blood work) drawn today and your tests are completely normal, you will receive your results only by: MyChart Message (if you have MyChart) OR A paper copy in the mail If you have any lab test that is abnormal or we need to change your treatment, we will call you to review the results.   Testing/Procedures:Your physician has requested that you have an echocardiogram. Echocardiography is a painless test that uses sound waves to create images of your heart. It provides your doctor with information about the size and shape of your heart and how well your heart's chambers and valves are working. This procedure takes approximately one hour. There are no restrictions for this procedure. Please do NOT wear cologne, perfume, aftershave, or lotions (deodorant is allowed). Please arrive 15 minutes prior to your appointment time.  Please note: We ask at that you not bring children with you during ultrasound (echo/ vascular) testing. Due to room size and safety concerns, children are not allowed in the ultrasound rooms during exams. Our front office staff cannot provide observation of children in our lobby area while testing is being conducted. An adult accompanying a patient to their appointment will only be allowed in the ultrasound room at the discretion of the ultrasound technician under special circumstances. We apologize for any inconvenience.     Your physician has requested that you have a lexiscan  myoview . For further information please visit [email or web address] Please follow instruction sheet, as given.  The test will take approximately 3 to 4 hours to complete; you may bring  reading material.  If someone comes with you to your appointment, they will need to remain in the main lobby due to limited space in the testing area.   How to prepare for your Myocardial Perfusion Test: Do not eat or drink 3 hours prior to your test, except you may have water. Do not consume products containing caffeine (regular or decaffeinated) 12 hours prior to your test. (ex: coffee, chocolate, sodas, tea). Do bring a list of your current medications with you.  If not listed below, you may take your medications as normal. Do wear comfortable clothes (no dresses or overalls) and walking shoes, tennis shoes preferred (No heels or open toe shoes are allowed). Do NOT wear cologne, perfume, aftershave, or lotions (deodorant is allowed). If these instructions are not followed, your test will have to be rescheduled.     Follow-Up: At [facility], you and your health needs are our priority.  As part of our continuing mission to provide you with exceptional heart care, we have created designated Provider Care Teams.  These Care Teams include your primary Cardiologist (physician) and Advanced Practice Providers (APPs -  Physician Assistants and Nurse Practitioners) who all work together to provide you with the care you need, when you need it.  We recommend signing up for the patient portal called MyChart.  Sign up information is provided on this After Visit Summary.  MyChart is used to connect with patients for Virtual Visits (Telemedicine).  Patients are able to view lab/test results, encounter notes, upcoming appointments, etc.  Non-urgent messages can be sent to your provider as well.   To learn more about what you  can do with MyChart, go to [email or web address].    Your next appointment:   2 month(s)  The format for your next appointment:   In Person  Provider:   Lamar Fitch, MD    Other Instructions NA

## 2024-08-03 ENCOUNTER — Encounter: Attending: Psychology | Admitting: Psychology

## 2024-08-03 ENCOUNTER — Encounter (HOSPITAL_COMMUNITY): Payer: Self-pay | Admitting: *Deleted

## 2024-08-03 ENCOUNTER — Telehealth (HOSPITAL_COMMUNITY): Payer: Self-pay | Admitting: *Deleted

## 2024-08-03 DIAGNOSIS — F411 Generalized anxiety disorder: Secondary | ICD-10-CM | POA: Insufficient documentation

## 2024-08-03 DIAGNOSIS — R413 Other amnesia: Secondary | ICD-10-CM | POA: Diagnosis not present

## 2024-08-03 DIAGNOSIS — G4489 Other headache syndrome: Secondary | ICD-10-CM | POA: Insufficient documentation

## 2024-08-03 DIAGNOSIS — G4733 Obstructive sleep apnea (adult) (pediatric): Secondary | ICD-10-CM | POA: Insufficient documentation

## 2024-08-03 DIAGNOSIS — F3341 Major depressive disorder, recurrent, in partial remission: Secondary | ICD-10-CM | POA: Diagnosis not present
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
## 2024-08-03 NOTE — Telephone Encounter (Signed)
 Stress test instructions sent via usps.

## 2024-08-04 DIAGNOSIS — R002 Palpitations: Secondary | ICD-10-CM | POA: Diagnosis not present

## 2024-08-08 DIAGNOSIS — F411 Generalized anxiety disorder: Secondary | ICD-10-CM | POA: Diagnosis not present

## 2024-08-08 DIAGNOSIS — F331 Major depressive disorder, recurrent, moderate: Secondary | ICD-10-CM | POA: Diagnosis not present

## 2024-08-08 DIAGNOSIS — R454 Irritability and anger: Secondary | ICD-10-CM | POA: Diagnosis not present

## 2024-08-09 ENCOUNTER — Ambulatory Visit (INDEPENDENT_AMBULATORY_CARE_PROVIDER_SITE_OTHER): Admitting: Pulmonary Disease

## 2024-08-09 VITALS — BP 131/87 | HR 75 | Temp 98.0°F | Ht 70.0 in | Wt 255.6 lb

## 2024-08-09 DIAGNOSIS — K219 Gastro-esophageal reflux disease without esophagitis: Secondary | ICD-10-CM | POA: Diagnosis not present

## 2024-08-09 DIAGNOSIS — Z6835 Body mass index (BMI) 35.0-35.9, adult: Secondary | ICD-10-CM | POA: Diagnosis not present

## 2024-08-09 DIAGNOSIS — E669 Obesity, unspecified: Secondary | ICD-10-CM | POA: Diagnosis not present

## 2024-08-09 DIAGNOSIS — G4733 Obstructive sleep apnea (adult) (pediatric): Secondary | ICD-10-CM | POA: Diagnosis not present

## 2024-08-09 DIAGNOSIS — I10 Essential (primary) hypertension: Secondary | ICD-10-CM | POA: Diagnosis not present

## 2024-08-09 DIAGNOSIS — R0689 Other abnormalities of breathing: Secondary | ICD-10-CM

## 2024-08-09 DIAGNOSIS — R0600 Dyspnea, unspecified: Secondary | ICD-10-CM

## 2024-08-09 DIAGNOSIS — R0609 Other forms of dyspnea: Secondary | ICD-10-CM | POA: Diagnosis not present
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
## 2024-08-09 NOTE — Patient Instructions (Signed)
  VISIT SUMMARY: During your visit, we discussed your worsening shortness of breath, dizziness, and numbness in your hands and feet. We also reviewed your history of hypertension, diabetes, and sleep apnea. We have planned several tests and referrals to better understand and manage your symptoms.  YOUR PLAN: -DIZZINESS AND PRESYNCOPE WITH SHORTNESS OF BREATH AND PARESTHESIAS: Your dizziness, feeling like you might pass out, and numbness in your hands and feet have been getting worse. We will do a high-resolution chest CT scan to check for any lung issues and a lung function test in three months unless your symptoms get worse. You will also try an albuterol  inhaler to see if it helps with your breathing, but stop using it if you feel your heart racing.  -OBSTRUCTIVE SLEEP APNEA: Obstructive sleep apnea is a condition where your breathing stops and starts during sleep. You use a CPAP machine for this, but you need a referral to see a sleep specialist since it has been over three years since your last visit.  -HYPERTENSION: Hypertension is high blood pressure. We need to review your current medications, especially since there have been recent changes due to your depression treatment.  INSTRUCTIONS: 1. Complete the high-resolution chest CT scan as soon as possible. 2. Use the albuterol  inhaler as directed, but stop if you experience palpitations. 3. Schedule a lung function test in three months, or sooner if your symptoms worsen. 4. Obtain a referral to a sleep specialist for re-evaluation of your obstructive sleep apnea. 5. Review your current hypertension medications with your doctor.

## 2024-08-10 ENCOUNTER — Other Ambulatory Visit: Payer: Self-pay | Admitting: Cardiology

## 2024-08-10 DIAGNOSIS — R0609 Other forms of dyspnea: Secondary | ICD-10-CM

## 2024-08-11 DIAGNOSIS — I10 Essential (primary) hypertension: Secondary | ICD-10-CM | POA: Diagnosis not present

## 2024-08-11 DIAGNOSIS — F411 Generalized anxiety disorder: Secondary | ICD-10-CM | POA: Diagnosis not present

## 2024-08-11 DIAGNOSIS — R454 Irritability and anger: Secondary | ICD-10-CM | POA: Diagnosis not present

## 2024-08-11 DIAGNOSIS — F331 Major depressive disorder, recurrent, moderate: Secondary | ICD-10-CM | POA: Diagnosis not present

## 2024-08-12 ENCOUNTER — Ambulatory Visit (HOSPITAL_COMMUNITY)
Admission: RE | Admit: 2024-08-12 | Discharge: 2024-08-12 | Disposition: A | Discharge status: Home / Self Care | Source: Ambulatory Visit | Attending: Cardiology | Admitting: Cardiology

## 2024-08-12 DIAGNOSIS — R0609 Other forms of dyspnea: Secondary | ICD-10-CM | POA: Diagnosis not present

## 2024-08-12 LAB — MYOCARDIAL PERFUSION IMAGING
LV dias vol: 95 mL (ref 62–150)
LV sys vol: 39 mL (ref 4.2–5.8)
Nuc Stress EF: 59 %
Peak HR: 105 {beats}/min
Rest HR: 60 {beats}/min
Rest Nuclear Isotope Dose: 12.5 mCi
SDS: 0
SRS: 1
SSS: 0
ST Depression (mm): 0 mm
Stress Nuclear Isotope Dose: 38.1 mCi
TID: 0.98

## 2024-08-12 MED ORDER — TECHNETIUM TC 99M TETROFOSMIN IV KIT
12.5000 | PACK | Freq: Once | INTRAVENOUS | Status: AC | PRN
  Administered 2024-08-12: 12.5 via INTRAVENOUS

## 2024-08-12 MED ORDER — REGADENOSON 0.4 MG/5ML IV SOLN
INTRAVENOUS | Status: AC
  Filled 2024-08-12: qty 5

## 2024-08-12 MED ORDER — TECHNETIUM TC 99M TETROFOSMIN IV KIT
38.1000 | PACK | Freq: Once | INTRAVENOUS | Status: AC | PRN
  Administered 2024-08-12: 38.1 via INTRAVENOUS

## 2024-08-12 MED ORDER — REGADENOSON 0.4 MG/5ML IV SOLN
0.4000 mg | Freq: Once | INTRAVENOUS | Status: AC
  Administered 2024-08-12: 0.4 mg via INTRAVENOUS

## 2024-08-15 ENCOUNTER — Ambulatory Visit
Admission: RE | Admit: 2024-08-15 | Discharge: 2024-08-15 | Disposition: A | Discharge status: Home / Self Care | Source: Ambulatory Visit | Attending: Pulmonary Disease | Admitting: Pulmonary Disease

## 2024-08-15 ENCOUNTER — Ambulatory Visit: Payer: Self-pay | Admitting: Cardiology

## 2024-08-15 DIAGNOSIS — F331 Major depressive disorder, recurrent, moderate: Secondary | ICD-10-CM | POA: Diagnosis not present

## 2024-08-15 DIAGNOSIS — F411 Generalized anxiety disorder: Secondary | ICD-10-CM | POA: Diagnosis not present

## 2024-08-15 DIAGNOSIS — R918 Other nonspecific abnormal finding of lung field: Secondary | ICD-10-CM | POA: Diagnosis not present

## 2024-08-15 DIAGNOSIS — R0600 Dyspnea, unspecified: Secondary | ICD-10-CM

## 2024-08-16 ENCOUNTER — Ambulatory Visit: Payer: Self-pay | Admitting: Cardiology

## 2024-08-18 DIAGNOSIS — R002 Palpitations: Secondary | ICD-10-CM | POA: Diagnosis not present

## 2024-08-19 ENCOUNTER — Telehealth: Payer: Self-pay

## 2024-08-19 NOTE — Telephone Encounter (Signed)
 Left message on My Chart with Stress Test results per Dr. Karry note. Routed to PCP.

## 2024-08-22 ENCOUNTER — Ambulatory Visit: Payer: Self-pay | Admitting: Pulmonary Disease

## 2024-08-22 ENCOUNTER — Ambulatory Visit (HOSPITAL_COMMUNITY)
Admission: RE | Admit: 2024-08-22 | Discharge: 2024-08-22 | Disposition: A | Discharge status: Home / Self Care | Source: Ambulatory Visit | Attending: Cardiology | Admitting: Cardiology

## 2024-08-22 DIAGNOSIS — R0609 Other forms of dyspnea: Secondary | ICD-10-CM | POA: Diagnosis not present

## 2024-08-22 DIAGNOSIS — I10 Essential (primary) hypertension: Secondary | ICD-10-CM | POA: Diagnosis not present

## 2024-08-22 DIAGNOSIS — F449 Dissociative and conversion disorder, unspecified: Secondary | ICD-10-CM | POA: Diagnosis not present

## 2024-08-22 DIAGNOSIS — F419 Anxiety disorder, unspecified: Secondary | ICD-10-CM | POA: Diagnosis not present

## 2024-08-22 LAB — ECHOCARDIOGRAM COMPLETE
Area-P 1/2: 3.21 cm2
S' Lateral: 2.9 cm

## 2024-08-23 ENCOUNTER — Telehealth: Payer: Self-pay

## 2024-08-23 NOTE — Telephone Encounter (Signed)
 Pt viewed Stress Test results on My Chart per Dr. Vanetta Shawl note. Routed to PCP.

## 2024-08-24 ENCOUNTER — Encounter: Payer: Self-pay | Admitting: Psychology

## 2024-08-28 ENCOUNTER — Encounter (HOSPITAL_BASED_OUTPATIENT_CLINIC_OR_DEPARTMENT_OTHER): Payer: Self-pay | Admitting: Emergency Medicine

## 2024-08-28 ENCOUNTER — Other Ambulatory Visit: Payer: Self-pay

## 2024-08-28 ENCOUNTER — Emergency Department (HOSPITAL_BASED_OUTPATIENT_CLINIC_OR_DEPARTMENT_OTHER)
Admission: EM | Admit: 2024-08-28 | Discharge: 2024-08-28 | Disposition: A | Discharge status: Home / Self Care | Attending: Emergency Medicine | Admitting: Emergency Medicine

## 2024-08-28 DIAGNOSIS — R519 Headache, unspecified: Secondary | ICD-10-CM | POA: Diagnosis not present

## 2024-08-28 DIAGNOSIS — L299 Pruritus, unspecified: Secondary | ICD-10-CM | POA: Insufficient documentation

## 2024-08-28 LAB — CBC WITH DIFFERENTIAL/PLATELET
Abs Immature Granulocytes: 0.01 K/uL (ref 0.00–0.07)
Basophils Absolute: 0.1 K/uL (ref 0.0–0.1)
Basophils Relative: 1 %
Eosinophils Absolute: 0.3 K/uL (ref 0.0–0.5)
Eosinophils Relative: 5 %
HCT: 38.8 % — ABNORMAL LOW (ref 39.0–52.0)
Hemoglobin: 14.2 g/dL (ref 13.0–17.0)
Immature Granulocytes: 0 %
Lymphocytes Relative: 25 %
Lymphs Abs: 1.3 K/uL (ref 0.7–4.0)
MCH: 31.8 pg (ref 26.0–34.0)
MCHC: 36.6 g/dL — ABNORMAL HIGH (ref 30.0–36.0)
MCV: 87 fL (ref 80.0–100.0)
Monocytes Absolute: 0.6 K/uL (ref 0.1–1.0)
Monocytes Relative: 11 %
Neutro Abs: 3.1 K/uL (ref 1.7–7.7)
Neutrophils Relative %: 58 %
Platelets: 116 K/uL — ABNORMAL LOW (ref 150–400)
RBC: 4.46 MIL/uL (ref 4.22–5.81)
RDW: 12 % (ref 11.5–15.5)
WBC: 5.3 K/uL (ref 4.0–10.5)
nRBC: 0 % (ref 0.0–0.2)

## 2024-08-28 LAB — COMPREHENSIVE METABOLIC PANEL WITH GFR
ALT: 23 U/L (ref 0–44)
AST: 24 U/L (ref 15–41)
Albumin: 4.4 g/dL (ref 3.5–5.0)
Alkaline Phosphatase: 72 U/L (ref 38–126)
Anion gap: 11 (ref 5–15)
BUN: 11 mg/dL (ref 6–20)
CO2: 23 mmol/L (ref 22–32)
Calcium: 9.7 mg/dL (ref 8.9–10.3)
Chloride: 105 mmol/L (ref 98–111)
Creatinine, Ser: 1.25 mg/dL — ABNORMAL HIGH (ref 0.61–1.24)
GFR, Estimated: >60 mL/min (ref >60)
Glucose, Bld: 105 mg/dL — ABNORMAL HIGH (ref 70–99)
Potassium: 3.9 mmol/L (ref 3.5–5.1)
Sodium: 140 mmol/L (ref 135–145)
Total Bilirubin: 0.9 mg/dL (ref 0.0–1.2)
Total Protein: 6.6 g/dL (ref 6.5–8.1)

## 2024-08-28 MED ORDER — ACETAMINOPHEN 500 MG PO TABS
1000.0000 mg | ORAL_TABLET | Freq: Once | ORAL | Status: AC
  Administered 2024-08-28: 1000 mg via ORAL
  Filled 2024-08-28: qty 2

## 2024-08-28 MED ORDER — CETIRIZINE HCL 10 MG PO TABS: 10.0000 mg | ORAL_TABLET | Freq: Every day | ORAL | 0 refills | Status: DC

## 2024-08-28 NOTE — ED Notes (Signed)
 Pt given discharge instructions and reviewed prescriptions. Opportunities given for questions. Pt verbalizes understanding. Pt did not want prescription allergy medicine. Reviewed to follow-up with neurology as well.  Bethena Powell SAUNDERS, RN

## 2024-08-28 NOTE — Discharge Instructions (Signed)
 You were seen for your headache in the emergency department.  You were given medicines which improved your symptoms.  At home, please take Tylenol  and ibuprofen for your headache.  Use Benadryl  at night for your itching.  You may also use the Zyrtec  we have prescribed you during the day.  Check your MyChart online for the results of any tests that had not resulted by the time you left the emergency department.   Follow-up with your primary doctor in 2-3 days regarding your visit.  Follow-up with neurology about your headache.  Return immediately to the emergency department if you experience any of the following: Vision changes, numbness or weakness of your arms or legs, or any other concerning symptoms.    Thank you for visiting our Emergency Department. It was a pleasure taking care of you today.

## 2024-08-28 NOTE — ED Triage Notes (Signed)
  Patient comes in with headache and itching that has been going on for about a week.  Patient states the pain is always there but isn't always severe.  Has been taking motrin, and excedrin for pain.  Last dose of motrin was 400 mg last night around 2200.  Pain 4/10, aching.

## 2024-08-28 NOTE — ED Provider Notes (Signed)
 [place] EMERGENCY DEPARTMENT AT [facility] Provider Note   CSN: [id number] Arrival date & time: 08/28/24  9364     Patient presents with: Headache and Pruritis   Shane Saunders is a 53 y.o. male.  {Add pertinent medical, surgical, social history, OB history to HPI:2874} 53 year old male who presents to the emergency department with headache and dizziness.  Patient reports that for the past 4 years he has been having headaches and dizziness.  Was diagnosed with vestibular migraines.  Says that over the past 6 months they have gotten worse especially over the past 1 to 2 months.  Headache feels like his brain is trying to get sucked in and pushed out at the same time.  Currently 4/10 severity.  Has associated photophobia.  Occasionally will have numbness and tingling of his hands and feet.  Says been taking some ibuprofen for the pain.  Over the past week has been having some itching of his skin but no rash.  Denies any new moisturizers or creams or medications.       Prior to Admission medications   Medication Sig Start Date End Date Taking? Authorizing Provider  ALPRAZolam  (XANAX ) 0.5 MG tablet Take 1 tablet by mouth 2 (two) times daily as needed.    [provider]  atorvastatin  (LIPITOR) 80 MG tablet TAKE 1 TABLET BY MOUTH EVERY DAY FOR CHOLESTEROL 08/12/21   Jeanine Knee, NP  azelastine  (ASTELIN ) 0.1 % nasal spray Place 1 spray into both nostrils 2 (two) times daily as needed for allergies.    [provider]  buPROPion  (WELLBUTRIN  XL) 300 MG 24 hr tablet Take 1 tablet every Morning for Mood, Focus & Concentration Patient not taking: Reported on 08/09/2024 03/07/21   Jeanine Knee, NP  Cholecalciferol (VITAMIN D3) 250 MCG (10000 UT) TABS Take 10,000 Units by mouth daily.    [provider]  escitalopram  (LEXAPRO ) 20 MG tablet Take  1 tablet  Daily  for Mood Patient not taking: Reported on 08/09/2024 05/28/21   Tonita Fallow, MD   fexofenadine  (ALLEGRA ) 60 MG tablet Takes 1 tablet 2 x /day as needed for Allergies Patient taking differently: Take 60 mg by mouth daily. 03/21/20   Tonita Fallow, MD  HYDROcodone -acetaminophen  (NORCO/VICODIN) 5-325 MG tablet Take 1 tablet by mouth every 4 (four) hours as needed. 05/24/24   Kommor, Madison, MD  hydrOXYzine  (ATARAX ) 25 MG tablet Take 25-50 mg by mouth at bedtime as needed for anxiety or itching. Patient not taking: Reported on 08/09/2024    [provider]  metoprolol  succinate (TOPROL -XL) 25 MG 24 hr tablet Take 1 tablet by mouth daily. 08/13/21   [provider]  naproxen  (NAPROSYN ) 375 MG tablet Take 1 tablet (375 mg total) by mouth 2 (two) times daily. Patient not taking: Reported on 08/09/2024 05/24/24   Kommor, Lum, MD  omeprazole  (PRILOSEC) 40 MG capsule TAKE 1 CAPSULE BY MOUTH EVERY DAY BEFORE BREAKFAST 02/03/22   Kerman Vina HERO, NP  ondansetron  (ZOFRAN -ODT) 4 MG disintegrating tablet Take 1 tablet (4 mg total) by mouth every 8 (eight) hours as needed for nausea or vomiting. 05/24/24   Kommor, Madison, MD    Allergies: Codeine, Effexor [venlafaxine], Prednisone , Tramadol  hcl, and Zinc     Review of Systems  Updated Vital Signs BP (!) 161/95 (BP Location: Right Arm)   Pulse 71   Temp 98.6 F (37 C) (Oral)   Resp 18   Ht 5' 10 (1.778 m)   Wt 116.6 kg   SpO2 98%  BMI 36.88 kg/m   Physical Exam Vitals and nursing note reviewed.  Constitutional:      General: He is not in acute distress.    Appearance: He is well-developed.  HENT:     Head: Normocephalic and atraumatic.     Right Ear: External ear normal.     Left Ear: External ear normal.     Nose: Nose normal.  Eyes:     Extraocular Movements: Extraocular movements intact.     Conjunctiva/sclera: Conjunctivae normal.     Pupils: Pupils are equal, round, and reactive to light.  Musculoskeletal:     Cervical back: Normal range of motion and neck supple.     Right lower leg: No  edema.     Left lower leg: No edema.  Skin:    General: Skin is warm and dry.  Neurological:     Mental Status: He is alert. Mental status is at baseline.     Comments: MENTAL STATUS: AAOx3 CRANIAL NERVES: II: Pupils equal and reactive 3 mm BL, no RAPD, no VF deficits III, IV, VI: EOM intact, no gaze preference or deviation, no nystagmus. V: normal sensation to light touch in V1, V2, and V3 segments bilaterally VII: no facial weakness or asymmetry, no nasolabial fold flattening VIII: normal hearing to speech and finger friction IX, X: normal palatal elevation, no uvular deviation XI: 5/5 head turn and 5/5 shoulder shrug bilaterally XII: midline tongue protrusion MOTOR: 5/5 strength in R shoulder flexion, elbow flexion and extension, and grip strength. 5/5 strength in L shoulder flexion, elbow flexion and extension, and grip strength.  5/5 strength in R hip and knee flexion, knee extension, ankle plantar and dorsiflexion. 5/5 strength in L hip and knee flexion, knee extension, ankle plantar and dorsiflexion. SENSORY: Normal sensation to light touch in all extremities COORD: Normal finger to nose and heel to shin, no tremor, no dysmetria  Psychiatric:        Mood and Affect: Mood normal.        Behavior: Behavior normal.     (all labs ordered are listed, but only abnormal results are displayed) Labs Reviewed - No data to display  EKG: None  Radiology: No results found.  {Document cardiac monitor, telemetry assessment procedure when appropriate:32947} Procedures   Medications Ordered in the ED - No data to display    {Click here for ABCD2, HEART and other calculators REFRESH Note before signing:1}                              Medical Decision Making Amount and/or Complexity of Data Reviewed Labs: ordered.  Risk OTC drugs.   ***  {Document critical care time when appropriate  Document review of labs and clinical decision tools ie CHADS2VASC2, etc  Document your  independent review of radiology images and any outside records  Document your discussion with family members, caretakers and with consultants  Document social determinants of health affecting pt's care  Document your decision making why or why not admission, treatments were needed:32947:::1}   Final diagnoses:  None    ED Discharge Orders     None

## 2024-08-30 ENCOUNTER — Emergency Department (HOSPITAL_BASED_OUTPATIENT_CLINIC_OR_DEPARTMENT_OTHER)
Admission: EM | Admit: 2024-08-30 | Discharge: 2024-08-30 | Disposition: A | Discharge status: Home / Self Care | Attending: Emergency Medicine | Admitting: Emergency Medicine

## 2024-08-30 ENCOUNTER — Emergency Department (HOSPITAL_BASED_OUTPATIENT_CLINIC_OR_DEPARTMENT_OTHER)

## 2024-08-30 ENCOUNTER — Other Ambulatory Visit: Payer: Self-pay

## 2024-08-30 ENCOUNTER — Encounter (HOSPITAL_BASED_OUTPATIENT_CLINIC_OR_DEPARTMENT_OTHER): Payer: Self-pay

## 2024-08-30 DIAGNOSIS — Z79899 Other long term (current) drug therapy: Secondary | ICD-10-CM | POA: Diagnosis not present

## 2024-08-30 DIAGNOSIS — G44221 Chronic tension-type headache, intractable: Secondary | ICD-10-CM | POA: Diagnosis not present

## 2024-08-30 DIAGNOSIS — R251 Tremor, unspecified: Secondary | ICD-10-CM | POA: Diagnosis not present

## 2024-08-30 DIAGNOSIS — R519 Headache, unspecified: Secondary | ICD-10-CM | POA: Diagnosis not present

## 2024-08-30 DIAGNOSIS — M542 Cervicalgia: Secondary | ICD-10-CM | POA: Diagnosis not present

## 2024-08-30 DIAGNOSIS — G44229 Chronic tension-type headache, not intractable: Secondary | ICD-10-CM | POA: Diagnosis not present

## 2024-08-30 DIAGNOSIS — I6521 Occlusion and stenosis of right carotid artery: Secondary | ICD-10-CM | POA: Diagnosis not present

## 2024-08-30 DIAGNOSIS — R6883 Chills (without fever): Secondary | ICD-10-CM | POA: Diagnosis not present

## 2024-08-30 LAB — CBC WITH DIFFERENTIAL/PLATELET
Abs Immature Granulocytes: 0.01 K/uL (ref 0.00–0.07)
Basophils Absolute: 0 K/uL (ref 0.0–0.1)
Basophils Relative: 1 %
Eosinophils Absolute: 0.2 K/uL (ref 0.0–0.5)
Eosinophils Relative: 3 %
HCT: 41.9 % (ref 39.0–52.0)
Hemoglobin: 15.5 g/dL (ref 13.0–17.0)
Immature Granulocytes: 0 %
Lymphocytes Relative: 25 %
Lymphs Abs: 1.8 K/uL (ref 0.7–4.0)
MCH: 32.4 pg (ref 26.0–34.0)
MCHC: 37 g/dL — ABNORMAL HIGH (ref 30.0–36.0)
MCV: 87.7 fL (ref 80.0–100.0)
Monocytes Absolute: 0.8 K/uL (ref 0.1–1.0)
Monocytes Relative: 11 %
Neutro Abs: 4.3 K/uL (ref 1.7–7.7)
Neutrophils Relative %: 60 %
Platelets: UNDETERMINED K/uL (ref 150–400)
RBC: 4.78 MIL/uL (ref 4.22–5.81)
RDW: 12 % (ref 11.5–15.5)
WBC: 7.1 K/uL (ref 4.0–10.5)
nRBC: 0 % (ref 0.0–0.2)

## 2024-08-30 LAB — BASIC METABOLIC PANEL WITH GFR
Anion gap: 13 (ref 5–15)
BUN: 18 mg/dL (ref 6–20)
CO2: 25 mmol/L (ref 22–32)
Calcium: 10.1 mg/dL (ref 8.9–10.3)
Chloride: 104 mmol/L (ref 98–111)
Creatinine, Ser: 1.44 mg/dL — ABNORMAL HIGH (ref 0.61–1.24)
GFR, Estimated: 58 mL/min — ABNORMAL LOW (ref >60)
Glucose, Bld: 91 mg/dL (ref 70–99)
Potassium: 4.1 mmol/L (ref 3.5–5.1)
Sodium: 141 mmol/L (ref 135–145)

## 2024-08-30 MED ORDER — IOHEXOL 350 MG/ML SOLN
75.0000 mL | Freq: Once | INTRAVENOUS | Status: AC | PRN
  Administered 2024-08-30: 75 mL via INTRAVENOUS

## 2024-08-30 NOTE — Discharge Instructions (Addendum)
 Labs are reassuring today.  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  It is recommended to follow-up with a neurologist of your choice for further evaluation.  If you have concerns for worsening symptoms or new symptoms arise return to ED for further evaluation.

## 2024-08-30 NOTE — ED Provider Notes (Signed)
 [place] EMERGENCY DEPARTMENT AT [facility] Provider Note   CSN: [id number] Arrival date & time: 08/30/24  1422     Patient presents with: Headache and Neck Pain   BRYDON SPAHR is a 53 y.o. male.  53 year old male comes to the ED with complaints of headaches, chills, shaking, decreased p.o. intake.  Patient also reports a feeling of a rash going up his abdomen during these headaches with intense itching.  Patient advises there is no visible rash. patient has had this issue for 5 years and has had multiple MRIs, CT scans, x-rays.  Patient has been seen by numerous specialists including psychologist, neurology, infectious disease, and other specialists.  All workups have reported no results the patient reports that he feels like something is wrong and he has never gotten answers.  Patient advised he was here a couple days ago with same symptoms with no answers.  Over the past month he reports the headaches, blurred vision, shaking, chills have gotten worse.       Prior to Admission medications   Medication Sig Start Date End Date Taking? Authorizing Provider  ALPRAZolam  (XANAX ) 0.5 MG tablet Take 1 tablet by mouth 2 (two) times daily as needed.    [provider]  atorvastatin  (LIPITOR) 80 MG tablet TAKE 1 TABLET BY MOUTH EVERY DAY FOR CHOLESTEROL 08/12/21   Jeanine Knee, NP  azelastine  (ASTELIN ) 0.1 % nasal spray Place 1 spray into both nostrils 2 (two) times daily as needed for allergies.    [provider]  buPROPion  (WELLBUTRIN  XL) 300 MG 24 hr tablet Take 1 tablet every Morning for Mood, Focus & Concentration Patient not taking: Reported on 08/09/2024 03/07/21   Jeanine Knee, NP  cetirizine  (ZYRTEC  ALLERGY) 10 MG tablet Take 1 tablet (10 mg total) by mouth daily. 08/28/24   Yolande Lamar BROCKS, MD  Cholecalciferol (VITAMIN D3) 250 MCG (10000 UT) TABS Take 10,000 Units by mouth daily.    [provider]  escitalopram  (LEXAPRO ) 20 MG tablet Take   1 tablet  Daily  for Mood Patient not taking: Reported on 08/09/2024 05/28/21   Tonita Fallow, MD  fexofenadine  (ALLEGRA ) 60 MG tablet Takes 1 tablet 2 x /day as needed for Allergies Patient taking differently: Take 60 mg by mouth daily. 03/21/20   Tonita Fallow, MD  HYDROcodone -acetaminophen  (NORCO/VICODIN) 5-325 MG tablet Take 1 tablet by mouth every 4 (four) hours as needed. 05/24/24   Kommor, Madison, MD  hydrOXYzine  (ATARAX ) 25 MG tablet Take 25-50 mg by mouth at bedtime as needed for anxiety or itching. Patient not taking: Reported on 08/09/2024    [provider]  metoprolol  succinate (TOPROL -XL) 25 MG 24 hr tablet Take 1 tablet by mouth daily. 08/13/21   [provider]  naproxen  (NAPROSYN ) 375 MG tablet Take 1 tablet (375 mg total) by mouth 2 (two) times daily. Patient not taking: Reported on 08/09/2024 05/24/24   Kommor, Lum, MD  omeprazole  (PRILOSEC) 40 MG capsule TAKE 1 CAPSULE BY MOUTH EVERY DAY BEFORE BREAKFAST 02/03/22   Kerman Vina HERO, NP  ondansetron  (ZOFRAN -ODT) 4 MG disintegrating tablet Take 1 tablet (4 mg total) by mouth every 8 (eight) hours as needed for nausea or vomiting. 05/24/24   Kommor, Madison, MD    Allergies: Codeine, Effexor [venlafaxine], Prednisone , Tramadol  hcl, and Zinc     Review of Systems  Constitutional:  Positive for activity change, appetite change and chills. Negative for fever.  Musculoskeletal:  Positive for neck pain.  Neurological:  Positive for headaches.  All other systems reviewed and are negative.   Updated Vital Signs BP (!) 134/101   Pulse 71   Temp 98.7 F (37.1 C) (Oral)   Resp 18   Ht 5' 10 (1.778 m)   Wt 116.6 kg   SpO2 97%   BMI 36.88 kg/m   Physical Exam Vitals and nursing note reviewed.  Constitutional:      Appearance: Normal appearance. He is well-developed.  HENT:     Head: Normocephalic and atraumatic.     Nose: Nose normal.  Eyes:     Extraocular Movements: Extraocular movements intact.      Right eye: Normal extraocular motion.     Left eye: Normal extraocular motion.     Conjunctiva/sclera: Conjunctivae normal.     Pupils: Pupils are equal, round, and reactive to light.  Neck:     Meningeal: Brudzinski's sign and Kernig's sign absent.  Cardiovascular:     Rate and Rhythm: Normal rate.  Pulmonary:     Effort: Pulmonary effort is normal. No respiratory distress.  Abdominal:     General: There is no distension.     Palpations: Abdomen is soft.     Tenderness: There is no abdominal tenderness.  Musculoskeletal:        General: Normal range of motion.     Cervical back: Normal range of motion and neck supple. No rigidity.  Skin:    General: Skin is warm and dry.     Capillary Refill: Capillary refill takes less than 2 seconds.  Neurological:     General: No focal deficit present.     Mental Status: He is alert and oriented to person, place, and time.     Cranial Nerves: No cranial nerve deficit.     Sensory: No sensory deficit.  Psychiatric:        Mood and Affect: Mood normal.        Behavior: Behavior normal.     (all labs ordered are listed, but only abnormal results are displayed) Labs Reviewed  BASIC METABOLIC PANEL WITH GFR - Abnormal; Notable for the following components:      Result Value   Creatinine, Ser 1.44 (*)    GFR, Estimated 58 (*)    All other components within normal limits  CBC WITH DIFFERENTIAL/PLATELET - Abnormal; Notable for the following components:   MCHC 37.0 (*)    All other components within normal limits    EKG: None  Radiology: No results found.   Procedures   Medications Ordered in the ED  iohexol  (OMNIPAQUE ) 350 MG/ML injection 75 mL (75 mLs Intravenous Contrast Given 08/30/24 2054)    53 y.o. male presents to the ED with complaints of headache, this involves an extensive number of treatment options, and is a complaint that carries with it a high risk of complications and morbidity.  The differential diagnosis includes  aneurysm, intracranial mass, intracranial bleeding, cervical lesion, meningitis (Ddx)  On arrival pt is nontoxic, vitals unremarkable besides slightly hypertensive. On exam patient has visible shaking in his upper extremities and no other notable symptoms, he is reporting headache.  Additional history obtained from chart review. Previous records obtained and reviewed patient has established care with pulmonology for dyspnea patient also has established primary care for essential hypertension, patient has established care and orthopedics, patient has also been seen by neurology for symptoms.  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  ED Course:   Patient is sitting comfortably in ED bed in no obvious distress.  He has bilateral shaking in his upper extremities.  He is reporting of headache in the distribution of a band across his forehead radiating to the top of his head.  He reports that these headaches come with some intense severity, and can be debilitating.  Patient is very anxious for answers because this has been going on for 5 years.  Patient has no deficits, weakness, loss of sensation in extremities.  Negative Kernig sign on exam and patient has full range of motion in his neck.  No bruits noted of carotid arteries bilaterally. patient is not febrile and does not look infectious.  Patient does not have increased work of breathing.  Patient has reported feeling of burning and rash on his abdomen, there is no obvious signs of rash and no pain to palpation throughout entire abdomen.  Patient does not have any GU or GI symptoms currently.  Patient has full strength in his distal extremities and no  obvious signs of edema bilaterally.  Patient also reports smelling something burning intermittently at his house and his family does not smell the same, this has been going on for several days.  Upon reevaluation patient is sitting more comfortably in ED bed.  Patient reported he has had his house tested and there is no toxins, been seen at [facility] for autoimmune etiologies and had his lab work sent to [facility] without any results.  Patient reports she is anxious for answers and wants to feel better.  It was advised we probably would not find answers today but it would be recommended to return to another neurologist for further evaluation and more workup and testing.  Patient was made aware of CT findings and agreed for appropriate follow-up.  Patient advised he agrees with this treatment plan and was comfortable for discharge.   Portions of this note were generated with Scientist, clinical (histocompatibility and immunogenetics). Dictation errors may occur despite best attempts at proofreading.   Final diagnoses:  Chronic tension-type headache, intractable    ED Discharge Orders     None          Myriam Fonda GORMAN DEVONNA 08/30/24 2242    Doretha Folks, MD 09/01/24 2100

## 2024-08-30 NOTE — ED Notes (Signed)
 Patient returned from CT

## 2024-08-30 NOTE — ED Triage Notes (Addendum)
 Patient arrives POV with complaints of ongoing headache, feelings of itching. Neck pain. Patient is concerned due to having worsening fatigue, body tingling numbness.  Patient reports that the patient is becoming increasingly weak with mood changes. Not eating well.  Seen here recently for the same with no clear diagnosis.

## 2024-09-01 DIAGNOSIS — R519 Headache, unspecified: Secondary | ICD-10-CM | POA: Diagnosis not present

## 2024-09-01 DIAGNOSIS — M542 Cervicalgia: Secondary | ICD-10-CM | POA: Diagnosis not present

## 2024-09-01 DIAGNOSIS — R630 Anorexia: Secondary | ICD-10-CM | POA: Diagnosis not present

## 2024-09-01 DIAGNOSIS — R82998 Other abnormal findings in urine: Secondary | ICD-10-CM | POA: Diagnosis not present

## 2024-09-01 DIAGNOSIS — R202 Paresthesia of skin: Secondary | ICD-10-CM | POA: Diagnosis not present

## 2024-09-06 DIAGNOSIS — I10 Essential (primary) hypertension: Secondary | ICD-10-CM | POA: Diagnosis not present

## 2024-09-06 DIAGNOSIS — Z6834 Body mass index (BMI) 34.0-34.9, adult: Secondary | ICD-10-CM | POA: Diagnosis not present

## 2024-09-06 DIAGNOSIS — E785 Hyperlipidemia, unspecified: Secondary | ICD-10-CM | POA: Diagnosis not present

## 2024-09-06 DIAGNOSIS — R0609 Other forms of dyspnea: Secondary | ICD-10-CM | POA: Diagnosis not present

## 2024-09-06 DIAGNOSIS — G4733 Obstructive sleep apnea (adult) (pediatric): Secondary | ICD-10-CM | POA: Diagnosis not present

## 2024-09-06 DIAGNOSIS — E669 Obesity, unspecified: Secondary | ICD-10-CM | POA: Diagnosis not present

## 2024-09-09 DIAGNOSIS — F331 Major depressive disorder, recurrent, moderate: Secondary | ICD-10-CM | POA: Diagnosis not present

## 2024-09-09 DIAGNOSIS — R454 Irritability and anger: Secondary | ICD-10-CM | POA: Diagnosis not present

## 2024-09-09 DIAGNOSIS — F411 Generalized anxiety disorder: Secondary | ICD-10-CM | POA: Diagnosis not present

## 2024-09-22 ENCOUNTER — Ambulatory Visit: Admitting: Pulmonary Disease

## 2024-10-11 DIAGNOSIS — I10 Essential (primary) hypertension: Secondary | ICD-10-CM | POA: Diagnosis not present

## 2024-10-11 DIAGNOSIS — Z79899 Other long term (current) drug therapy: Secondary | ICD-10-CM | POA: Diagnosis not present

## 2024-10-11 DIAGNOSIS — K219 Gastro-esophageal reflux disease without esophagitis: Secondary | ICD-10-CM | POA: Diagnosis not present

## 2024-10-11 DIAGNOSIS — G4733 Obstructive sleep apnea (adult) (pediatric): Secondary | ICD-10-CM | POA: Diagnosis not present

## 2024-10-11 DIAGNOSIS — R0609 Other forms of dyspnea: Secondary | ICD-10-CM | POA: Diagnosis not present

## 2024-10-11 DIAGNOSIS — Z6833 Body mass index (BMI) 33.0-33.9, adult: Secondary | ICD-10-CM | POA: Diagnosis not present

## 2024-10-11 DIAGNOSIS — Z6834 Body mass index (BMI) 34.0-34.9, adult: Secondary | ICD-10-CM | POA: Diagnosis not present

## 2024-10-11 DIAGNOSIS — E669 Obesity, unspecified: Secondary | ICD-10-CM | POA: Diagnosis not present

## 2024-10-17 ENCOUNTER — Ambulatory Visit: Attending: Cardiology | Admitting: Cardiology

## 2024-10-17 ENCOUNTER — Encounter: Payer: Self-pay | Admitting: *Deleted

## 2024-10-17 ENCOUNTER — Encounter: Payer: Self-pay | Admitting: Cardiology

## 2024-10-17 VITALS — BP 100/80 | HR 66 | Ht 70.0 in | Wt 238.0 lb

## 2024-10-17 DIAGNOSIS — I10 Essential (primary) hypertension: Secondary | ICD-10-CM

## 2024-10-17 DIAGNOSIS — G4733 Obstructive sleep apnea (adult) (pediatric): Secondary | ICD-10-CM | POA: Diagnosis not present

## 2024-10-17 DIAGNOSIS — I951 Orthostatic hypotension: Secondary | ICD-10-CM

## 2024-10-17 DIAGNOSIS — H8190 Unspecified disorder of vestibular function, unspecified ear: Secondary | ICD-10-CM | POA: Insufficient documentation

## 2024-10-17 DIAGNOSIS — E782 Mixed hyperlipidemia: Secondary | ICD-10-CM

## 2024-10-17 DIAGNOSIS — H1045 Other chronic allergic conjunctivitis: Secondary | ICD-10-CM | POA: Insufficient documentation

## 2024-10-17 DIAGNOSIS — R062 Wheezing: Secondary | ICD-10-CM | POA: Insufficient documentation

## 2024-10-17 DIAGNOSIS — R5382 Chronic fatigue, unspecified: Secondary | ICD-10-CM | POA: Diagnosis not present

## 2024-10-17 DIAGNOSIS — J309 Allergic rhinitis, unspecified: Secondary | ICD-10-CM | POA: Insufficient documentation

## 2024-10-17 NOTE — Progress Notes (Signed)
 Cardiology Office Note:    Date:  10/17/2024   ID:  Shane Saunders, DOB 12-Jul-1971, MRN [id number]  PCP:  Shane Santina BRAVO, PA  Cardiologist:  Shane Fitch, MD    Referring MD: Shane, Shane  E, PA   Chief Complaint  Patient presents with   Follow-up  Doing fine  History of Present Illness:    Shane Saunders is a 53 y.o. male past medical history significant for orthostatic hypotension, questionable POTS, depression, prediabetes, dyslipidemia, obstructive sleep apnea was sent to us  because of atypical symptoms but some of the symptoms include worrisome concern of chest tightness.  We end up doing stress test which was negative, we did echocardiogram showed no significant abnormality cardiac wise I do not see an explanation for his symptomatology.  His blood pressure is low today and he said also sometimes when he gets up very quickly he will get dizzy.  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   Morbid obesity (HCC)    Obesity (BMI 30-39.9)    OSA on CPAP    Prediabetes    Right ureteral stone    Sleep apnea    C PAP   Vertigo    Vitamin D  deficiency    Wears glasses     Past Surgical History:  Procedure Laterality Date   ABDOMINAL EXPLORATION SURGERY  1998   Celiotomy and Appendectomy /  (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, congenital)   ARTERY REPAIR Right 05/31/2014   Procedure: IRRIGATION, EXPLORATION, AND REPAIR OF RIGHT ARM WOUND;  Surgeon: Shane MALVA Pina, MD;  Location: [facility] OR;  Service: General;  Laterality: Right;   COLONOSCOPY     CYSTOSCOPY WITH RETROGRADE PYELOGRAM, URETEROSCOPY AND STENT PLACEMENT Right 07/11/2016   Procedure:  CYSTOSCOPY WITH RETROGRADE PYELOGRAM, URETEROSCOPY, BASKET STONE EXTRACTION  AND STENT PLACEMENT;  Surgeon: Shane Likens, MD;  Location: [facility];  Service: Urology;  Laterality: Right;  45 MINS  C-ARM DIGITAL URETEROSCOPE HOLMIUM LASER   ESOPHAGOGASTRODUODENOSCOPY      Current Medications: Current Meds  Medication Sig   ALPRAZolam  (XANAX ) 0.5 MG tablet Take 1 tablet by mouth 2 (two) times daily as needed.   atorvastatin  (LIPITOR) 80 MG tablet TAKE 1 TABLET BY MOUTH EVERY DAY FOR CHOLESTEROL   azelastine  (ASTELIN ) 0.1 % nasal spray Place 1 spray into both nostrils 2 (two) times daily as needed for allergies.   buPROPion  (WELLBUTRIN  XL) 300 MG 24 hr tablet Take 1 tablet every Morning for Mood, Focus & Concentration   fexofenadine  (ALLEGRA ) 180 MG tablet Take 180 mg by mouth daily.   metoprolol  succinate (TOPROL -XL) 25 MG 24 hr tablet Take 1 tablet by mouth daily.   omeprazole  (PRILOSEC) 40 MG capsule TAKE 1 CAPSULE BY MOUTH EVERY DAY BEFORE BREAKFAST   tamsulosin  (FLOMAX ) 0.4 MG CAPS capsule Take 0.4 mg by mouth at bedtime.     Allergies:   Codeine, Effexor [venlafaxine], Prednisone , Tramadol  hcl, and Zinc    Social History   Socioeconomic History   Marital status: Married    Spouse name: Shane Saunders   Number of children: 2   Years of  education: 12   Highest education level: Not on file  Occupational History    Comment: na  Tobacco Use   Smoking status: Former    Current packs/day: 0.00    Average packs/day: 0.5 packs/day for 10.0 years (5.0 ttl pk-yrs)    Types: Cigarettes    Start date: 10/28/1999    Quit date: 10/27/2009    Years since quitting: 14.9   Smokeless tobacco: Former    Types: Snuff  Vaping Use   Vaping status: Never Used  Substance and Sexual Activity   Alcohol use: Not Currently    Comment: occasional   Drug use: No   Sexual activity: Yes    Comment: Vasectomy  Other Topics Concern   Not on file  Social History Narrative   Lives with  wife   Caffeine -tea, 1 daily       Social Drivers of Health   Financial Resource Strain: Low Risk  (06/20/2024)   Received from [facility]   Overall Financial Resource Strain (CARDIA)    Difficulty of Paying Living Expenses: Not very hard  Food Insecurity: No Food Insecurity (06/20/2024)   Received from [facility]   Hunger Vital Sign    Within the past 12 months, you worried that your food would run out before you got the money to buy more.: Never true    Within the past 12 months, the food you bought just didn't last and you didn't have money to get more.: Never true  Transportation Needs: No Transportation Needs (06/20/2024)   Received from [facility] - Transportation    Lack of Transportation (Medical): No    Lack of Transportation (Non-Medical): No  Physical Activity: Unknown (06/20/2024)   Received from [facility]   Exercise Vital Sign    On average, how many days per week do you engage in moderate to strenuous exercise (like a brisk walk)?: 0 days    Minutes of Exercise per Session: Not on file  Stress: Stress Concern Present (06/20/2024)   Received from [facility] - Occupational Stress Questionnaire    Feeling of Stress : To some extent  Social Connections: Moderately Integrated (06/20/2024)   Received from [facility]   Social Network    How would you rate your social network (family, work, friends)?: Adequate participation with social networks     Family History: The patient's family history includes CAD in his paternal grandfather; Cancer in his sister; Diabetes in his father; Hyperlipidemia in his father; Hypertension in his father and mother; Migraines in his mother; Prostate cancer in his paternal uncle; Thyroid  disease in his mother. There is no history of Colon cancer, Rectal cancer, or Stomach cancer. ROS:   Please see the history of present illness.    All 14 point review of systems negative  except as described per history of present illness  EKGs/Labs/Other Studies Reviewed:         Recent Labs: 07/26/2024: B Natriuretic Peptide 7.7 08/28/2024: ALT 23 08/30/2024: BUN 18; Creatinine, Ser 1.44; Hemoglobin 15.5; Platelets PLATELET CLUMPS NOTED ON SMEAR, UNABLE TO ESTIMATE; Potassium 4.1; Sodium 141  Recent Lipid Panel    Component Value Date/Time   CHOL 147 05/08/2021 1439   TRIG 98 05/08/2021 1439   HDL 40 05/08/2021 1439   CHOLHDL 3.7 05/08/2021 1439   VLDL 34 (H) 05/27/2017 1622   LDLCALC 88 05/08/2021 1439    Physical Exam:    VS:  BP 100/80  Pulse 66   Ht 5' 10 (1.778 m)   Wt 238 lb (108 kg)   SpO2 96%   BMI 34.15 kg/m     Wt Readings from Last 3 Encounters:  10/17/24 238 lb (108 kg)  08/30/24 257 lb 0.9 oz (116.6 kg)  08/28/24 257 lb (116.6 kg)     GEN:  Well nourished, well developed in no acute distress HEENT: Normal NECK: No JVD; No carotid bruits LYMPHATICS: No lymphadenopathy CARDIAC: RRR, no murmurs, no rubs, no gallops RESPIRATORY:  Clear to auscultation without rales, wheezing or rhonchi  ABDOMEN: Soft, non-tender, non-distended MUSCULOSKELETAL:  No edema; No deformity  SKIN: Warm and dry LOWER EXTREMITIES: no swelling NEUROLOGIC:  Alert and oriented x 3 PSYCHIATRIC:  Normal affect   ASSESSMENT:    1. Orthostatic hypotension   2. Essential hypertension   3. Obstructive sleep apnea hypopnea, severe   4. Chronic fatigue   5. Hyperlipidemia, mixed    PLAN:    In order of problems listed above:  Constellation for atypical symptoms with some orthostatic hypotension.  Advised him to drink plenty of fluids and liberate some salt intake. Essential hypertension without was a problem with the blood pressure being low. Obstructive sleep apnea, that being managed by internal medicine team. Chronic fatigue with some depression follow-up by internal medicine team. Dyslipidemia I did review KPN which show me his LDL 55 HDL 33 good control  with Lipitor 80 continue present management.   Medication Adjustments/Labs and Tests Ordered: Current medicines are reviewed at length with the patient today.  Concerns regarding medicines are outlined above.  No orders of the defined types were placed in this encounter.  Medication changes: No orders of the defined types were placed in this encounter.   Signed, Shane DOROTHA Fitch, MD, [facility] 10/17/2024 10:20 AM    [facility]

## 2024-10-17 NOTE — Patient Instructions (Signed)

## 2024-11-01 DIAGNOSIS — F411 Generalized anxiety disorder: Secondary | ICD-10-CM | POA: Diagnosis not present

## 2024-11-01 DIAGNOSIS — F331 Major depressive disorder, recurrent, moderate: Secondary | ICD-10-CM | POA: Diagnosis not present

## 2024-11-01 DIAGNOSIS — R454 Irritability and anger: Secondary | ICD-10-CM | POA: Diagnosis not present

## 2024-11-08 ENCOUNTER — Ambulatory Visit

## 2024-11-08 ENCOUNTER — Ambulatory Visit (INDEPENDENT_AMBULATORY_CARE_PROVIDER_SITE_OTHER): Admitting: Pulmonary Disease

## 2024-11-08 VITALS — BP 115/81 | HR 65 | Temp 98.0°F | Ht 71.0 in | Wt 242.0 lb

## 2024-11-08 DIAGNOSIS — R0600 Dyspnea, unspecified: Secondary | ICD-10-CM

## 2024-11-08 DIAGNOSIS — R0689 Other abnormalities of breathing: Secondary | ICD-10-CM | POA: Diagnosis not present

## 2024-11-08 DIAGNOSIS — G4733 Obstructive sleep apnea (adult) (pediatric): Secondary | ICD-10-CM | POA: Diagnosis not present

## 2024-11-08 LAB — PULMONARY FUNCTION TEST
DL/VA % pred: 101 %
DL/VA: 4.43 ml/min/mmHg/L
DLCO unc % pred: 94 %
DLCO unc: 27.21 ml/min/mmHg
FEF 25-75 Post: 4.38 L/s
FEF 25-75 Pre: 3.28 L/s
FEF2575-%Change-Post: 33 %
FEF2575-%Pred-Post: 133 %
FEF2575-%Pred-Pre: 100 %
FEV1-%Change-Post: 9 %
FEV1-%Pred-Post: 98 %
FEV1-%Pred-Pre: 89 %
FEV1-Post: 3.74 L
FEV1-Pre: 3.41 L
FEV1FVC-%Change-Post: 1 %
FEV1FVC-%Pred-Pre: 105 %
FEV6-%Change-Post: 8 %
FEV6-%Pred-Post: 95 %
FEV6-%Pred-Pre: 88 %
FEV6-Post: 4.54 L
FEV6-Pre: 4.2 L
FEV6FVC-%Change-Post: 0 %
FEV6FVC-%Pred-Post: 103 %
FEV6FVC-%Pred-Pre: 103 %
FVC-%Change-Post: 8 %
FVC-%Pred-Post: 91 %
FVC-%Pred-Pre: 85 %
FVC-Post: 4.55 L
FVC-Pre: 4.21 L
Post FEV1/FVC ratio: 82 %
Post FEV6/FVC ratio: 100 %
Pre FEV1/FVC ratio: 81 %
Pre FEV6/FVC Ratio: 100 %
RV % pred: 114 %
RV: 2.43 L
TLC % pred: 98 %
TLC: 6.88 L

## 2024-11-08 NOTE — Progress Notes (Signed)
 Full pft performed today

## 2024-11-08 NOTE — Progress Notes (Signed)
 Shane Saunders    [id number]    [birth year]  Primary Care Physician:Miller, [place]  E, PA  Referring Physician: Maleko Greulich, MD [address] [place],  [place] [address]  Chief complaint: Follow-up for dyspnea, dizziness  HPI: 53 y.o. who  has a past medical history of Abnormal glucose, Borderline hypertension, BPH (benign prostatic hypertrophy), Crushing injury of left elbow and forearm (07/13/2015), Depression, major, in partial remission (07/12/2015), Dizziness, Hyperlipidemia, Hypertension, Hypogonadism male, Malrotation of colon (HCC), Morbid obesity (HCC), Obesity (BMI 30-39.9), OSA on CPAP, Prediabetes, Right ureteral stone, Sleep apnea, Vertigo, Vitamin D  deficiency, and Wears glasses.  Discussed the use of AI scribe software for clinical note transcription with the patient, who gave verbal consent to proceed.  History of Present Illness Shane Saunders is a 53 year old male with hypertension, diabetes, and sleep apnea who presents with worsening shortness of breath and dizziness.  Dyspnea - Worsening shortness of breath over the past 1-2 months - Oxygen saturation ranges between 92% and 94% - Becomes winded with minimal exertion - Significantly limited daily activities due to dyspnea - No recent travel, exposure to mold, or use of feather pillows/comforters - Home tested negative for mold - No pets at home  Dizziness and presyncope - Episodes of dizziness that occur suddenly and resolve quickly - Dizziness triggered by getting up or turning head - Feels like he might pass out with minimal activity, such as taking deep breaths - Episodes severe enough to require emergency room evaluation - D-dimer and troponin, EKG in the emergency room on 7/29 was negative - Previous tests for POTS hypertension were negative  Peripheral neuropathy - Numbness in feet and hands, sometimes extending to arms and legs - Symptoms have led to significant limitations in  daily activities  Comorbidities and relevant history - Hypertension and diabetes mellitus - Obstructive sleep apnea, uses CPAP machine - Unable to see prescribing physician for CPAP due to need for referral; last visit over three years ago  Psychiatric medication history - Previously treated with Wellbutrin  and Lexapro  for depression, discontinued due to side effects - Currently on a different antidepressant  Interim history: Shane Saunders is a 53 year old male with coronary atherosclerosis who presents with breathing difficulties and dizziness.  Dyspnea - Breathing difficulties have improved over time and are less severe. - Possesses an albuterol  inhaler but has not used it due to symptom improvement.  Dizziness - Occasional dizziness, particularly when rising from the floor or looking up.  Obstructive sleep apnea - Uses a CPAP machine for sleep apnea, diagnosed approximately twenty years ago. - No recent consultation with a sleep specialist. - Last sleep study conducted at [facility].  Relevant Pulmonary history: Pets: No pets Occupation: Worked as a psychologist, occupational.  Currently on disability Exposures: No mold, hot tub or Jacuzzi.  No further pillows or comforters No h/o chemo/XRT/amiodarone/macrodantin/MTX  No exposure to asbestos, silica or other organic allergens  Smoking history: 5-pack-year smoker.  Quit in 2012 Travel history: No significant travel history Family history: No family history of lung disease   Outpatient Encounter Medications as of 11/08/2024  Medication Sig   ALPRAZolam  (XANAX ) 0.5 MG tablet Take 1 tablet by mouth 2 (two) times daily as needed.   atorvastatin  (LIPITOR) 80 MG tablet TAKE 1 TABLET BY MOUTH EVERY DAY FOR CHOLESTEROL (Patient taking differently: 40 mg. TAKE 1 TABLET BY MOUTH EVERY DAY FOR CHOLESTEROL)   azelastine  (ASTELIN ) 0.1 %  nasal spray Place 1 spray into both nostrils 2 (two) times daily as needed for allergies.   buPROPion   (WELLBUTRIN  XL) 300 MG 24 hr tablet Take 1 tablet every Morning for Mood, Focus & Concentration   fexofenadine  (ALLEGRA ) 180 MG tablet Take 180 mg by mouth daily.   metoprolol  succinate (TOPROL -XL) 25 MG 24 hr tablet Take 1 tablet by mouth daily.   omeprazole  (PRILOSEC) 40 MG capsule TAKE 1 CAPSULE BY MOUTH EVERY DAY BEFORE BREAKFAST   tamsulosin  (FLOMAX ) 0.4 MG CAPS capsule Take 0.4 mg by mouth at bedtime.   No facility-administered encounter medications on file as of 11/08/2024.     Physical Exam: Today's Vitals   11/08/24 1049  BP: 115/81  Pulse: 65  Temp: 98 F (36.7 C)  TempSrc: Oral  SpO2: 96%  Weight: 242 lb (109.8 kg)  Height: 5' 11 (1.803 m)   Body mass index is 33.75 kg/m.  Physical Exam GEN: No acute distress CV: Regular rate and rhythm no murmurs LUNGS: Clear to auscultation bilaterally normal respiratory effort SKIN JOINTS: Warm and dry no rash    Data Reviewed: Imaging: CT chest 03/12/2020-minimal subpleural atelectasis Chest x-ray 07/26/2024-minimal basilar atelectasis High resolution CT 08/15/2024-no evidence of interstitial lung disease.  Mild air trapping.  Age advanced coronary calcification. I reviewed the images personally.  PFTs: 11/08/2024 FVC 4.55 [91%], FEV1 3.74 [98%], F/F82, TLC 6.88 [98%], DLCO 27.21 [94%] Normal test  Labs:  Cardiac: Echocardiogram 04/10/2020-LVEF 75%, mild LVH, normal RV systolic size and function Assessment & Plan Dyspnea and dizziness Dyspnea has improved but occasional dizziness persists. Recent CT scan and lung function tests are normal, with no interstitial lung disease. Coronary atherosclerosis is present but managed by cardiology. No clear etiology for dizziness identified.  Obstructive sleep apnea, on CPAP therapy Obstructive sleep apnea managed with CPAP therapy. No recent follow-up with a sleep specialist. Previous sleep studies conducted without ongoing specialist care. - Referred to sleep specialist for  follow-up and management of obstructive sleep apnea.  Coronary atherosclerosis, stable and managed by cardiology Coronary atherosclerosis is well-managed by cardiology. Recent stress test results are normal. - Continue management with cardiology.  Recommendations: Transfer care to sleep clinic for management of OSA  I personally spent a total of 30 minutes in the care of the patient today including preparing to see the patient, getting/reviewing separately obtained history, performing a medically appropriate exam/evaluation, counseling and educating, placing orders, independently interpreting results, and communicating results.   Lonna Coder MD [facility] 11/08/2024, 11:14 AM  CC: Courtnie Brenes, MD

## 2024-11-08 NOTE — Patient Instructions (Signed)
 Full pft performed today

## 2024-11-08 NOTE — Patient Instructions (Signed)
  VISIT SUMMARY: You visited us  today due to breathing difficulties and occasional dizziness. We discussed your ongoing management for obstructive sleep apnea and coronary atherosclerosis.  YOUR PLAN: DYSPNEA AND DIZZINESS: You have been experiencing breathing difficulties and occasional dizziness. Recent tests show no lung disease, and your heart condition is being managed by cardiology. -Continue with your current management plan as your symptoms persist.  OBSTRUCTIVE SLEEP APNEA: You have obstructive sleep apnea and use a CPAP machine. You have not seen a sleep specialist recently. -We have referred you to a sleep specialist for follow-up and management of your sleep apnea.  CORONARY ATHEROSCLEROSIS: Your coronary atherosclerosis is stable and managed by cardiology. Recent stress test results are normal. -Continue your current management with cardiology.

## 2024-11-09 ENCOUNTER — Encounter

## 2024-11-09 ENCOUNTER — Ambulatory Visit: Admitting: Pulmonary Disease

## 2024-11-09 DIAGNOSIS — R0609 Other forms of dyspnea: Secondary | ICD-10-CM | POA: Diagnosis not present

## 2024-11-09 DIAGNOSIS — G629 Polyneuropathy, unspecified: Secondary | ICD-10-CM | POA: Diagnosis not present

## 2024-11-09 DIAGNOSIS — K219 Gastro-esophageal reflux disease without esophagitis: Secondary | ICD-10-CM | POA: Diagnosis not present

## 2024-11-09 DIAGNOSIS — I10 Essential (primary) hypertension: Secondary | ICD-10-CM | POA: Diagnosis not present

## 2024-11-09 DIAGNOSIS — G4733 Obstructive sleep apnea (adult) (pediatric): Secondary | ICD-10-CM | POA: Diagnosis not present

## 2024-11-22 ENCOUNTER — Ambulatory Visit

## 2024-11-22 VITALS — BP 111/73 | HR 54 | Temp 99.0°F | Ht 70.0 in | Wt 245.0 lb

## 2024-11-22 DIAGNOSIS — G4733 Obstructive sleep apnea (adult) (pediatric): Secondary | ICD-10-CM | POA: Diagnosis not present

## 2024-11-22 NOTE — Patient Instructions (Signed)
  VISIT SUMMARY: You came in today for a follow-up on your sleep apnea and CPAP therapy. You have been using a CPAP machine since 2021, which has improved your condition, but you still experience tiredness and wake up multiple times during the night. You also have a history of vertigo and balance issues, which have improved over time.  YOUR PLAN: -OBSTRUCTIVE SLEEP APNEA: Obstructive sleep apnea is a condition where your airway becomes blocked during sleep, causing breathing pauses. You are managing this with CPAP therapy, which you are using consistently. We have ordered a nocturnal oximetry test to check your oxygen levels during CPAP use. If the test shows low oxygen levels, we may consider adding supplemental oxygen therapy.  -CHRONIC FATIGUE: Chronic fatigue is ongoing tiredness that doesn't improve with rest. Despite using CPAP, you still feel tired. We have ruled out thyroid  issues and other significant conditions. You will continue with your current antidepressant, which has a stimulant component, and we will monitor your symptoms.  INSTRUCTIONS: Please complete the nocturnal oximetry test as ordered. Follow up with us  to discuss the results and any further steps, including the potential need for supplemental oxygen therapy.                      Contains text generated by Abridge.                                 Contains text generated by Abridge.

## 2024-11-22 NOTE — Progress Notes (Signed)
 Pulmonology Office Visit   Subjective:  Patient ID: Shane Saunders, male    DOB: 12-17-1971  MRN: [id number]  Referred by: Cleotilde, [place]  E, PA  CC:  Chief Complaint  Patient presents with   Obstructive Sleep Apnea    Pt states he uses CPAP every night. States he has no concern, just wants to get established with a sleep provider.     HPI Shane Saunders is a 54 y.o. male ex smoker quit >15 yrs ago with hypertension, hyperlipidemia, depression, OSA on CPAP was seen by Dr. Theophilus for dyspnea and is being referred for OSA management.  Respective notes from provider reviewed as appropriate to gather relevant information for patient care.   Discussed the use of AI scribe software for clinical note transcription with the patient, who gave verbal consent to proceed.  History of Present Illness   Shane Saunders is a 52 year old male with sleep apnea who presents for follow-up on CPAP evaluation.  He has a long-standing history of sleep apnea, diagnosed approximately 25 years ago. Since 2021, he has been using a CPAP machine, which has improved his condition. Despite good compliance with CPAP, he continues to experience elevated effort levels and does not feel refreshed upon waking. He goes to bed between 9:00 to 10:00 PM, falls asleep within 20 minutes, but wakes up multiple times during the night, finally waking up around 7:00 to 8:00 AM.  He does not snore unless the CPAP mask shifts, and he no longer wakes up gasping or choking for air, which was a previous issue. He sleeps on his side or stomach and has not experienced any recent episodes of gasping or choking. His current CPAP machine, acquired in 2021, is more comfortable and quieter than his previous one, with adjustable pressure settings that he finds more tolerable.  He experiences persistent tiredness despite CPAP use and has undergone various tests to rule out other causes. He takes an antidepressant, which he believes  contains a stimulant, but does not feel it significantly impacts his tiredness.  He has a history of vertigo and balance issues, which have improved over time, though he still experiences symptoms when bending forward or looking up.  He has a history of smoking and chewing tobacco, both of which he quit approximately 15-20 years ago. He is currently on disability, having previously worked as a psychologist, occupational and in a biochemist, clinical, where he was exposed to fumes without protective masks.      OSA history: Dx 25 yrs ago>NPSG 2021>CPAP 6-19cm H2O.   PAP download compliance data: [employer] October-November 2025. Encore/[employer]view ResMed 10. Pressure: 6-19 (med 7, Max 15) Hours of usage: 9-hour 4-minute. Days used >4hr: 30 out of 30. Leak: Minimal. AHI: 1.  PRIOR TESTS and IMAGING: PSG/HSAT:  NPSG March 2021: AHI 80.6, O2 nadir 72, desaturation for 63 minutes. Wt 210lb.   Echo August 2025: EF 60-65%, diastolic function normal, RV systolic function normal.  No valvular abnormality.    CT chest August 2025: Reviewed by me.  No ILD.  Right middle lobe and left lower lobe atelectasis/scarring.  No significant mosaic attenuation.  PFTs 10/29/24> normal PFT.      11/22/2024   10:00 AM  Results of the Epworth flowsheet  Sitting and reading 3  Watching TV 3  Sitting, inactive in a public place (e.g. a theatre or a meeting) 3  As a passenger in a car for an hour without a break 3  Lying  down to rest in the afternoon when circumstances permit 3  Sitting and talking to someone 1  Sitting quietly after a lunch without alcohol 3  In a car, while stopped for a few minutes in traffic 0  Total score 19    Allergies: Codeine, Effexor [venlafaxine], Prednisone , Tramadol  hcl, and Zinc   Current Outpatient Medications:    ALPRAZolam  (XANAX ) 0.5 MG tablet, Take 1 tablet by mouth 2 (two) times daily as needed., Disp: , Rfl:    atorvastatin  (LIPITOR) 80 MG tablet, TAKE 1 TABLET BY MOUTH EVERY DAY FOR  CHOLESTEROL (Patient taking differently: 40 mg. TAKE 1 TABLET BY MOUTH EVERY DAY FOR CHOLESTEROL), Disp: 90 tablet, Rfl: 1   azelastine  (ASTELIN ) 0.1 % nasal spray, Place 1 spray into both nostrils 2 (two) times daily as needed for allergies., Disp: , Rfl:    buPROPion  (WELLBUTRIN  XL) 300 MG 24 hr tablet, Take 1 tablet every Morning for Mood, Focus & Concentration, Disp: 90 tablet, Rfl: 3   fexofenadine  (ALLEGRA ) 180 MG tablet, Take 180 mg by mouth daily., Disp: , Rfl:    metoprolol  succinate (TOPROL -XL) 25 MG 24 hr tablet, Take 1 tablet by mouth daily., Disp: , Rfl:    omeprazole  (PRILOSEC) 40 MG capsule, TAKE 1 CAPSULE BY MOUTH EVERY DAY BEFORE BREAKFAST, Disp: 90 capsule, Rfl: 0   tamsulosin  (FLOMAX ) 0.4 MG CAPS capsule, Take 0.4 mg by mouth at bedtime., Disp: , Rfl:  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   Morbid obesity (HCC)    Obesity (BMI 30-39.9)    OSA on CPAP    Prediabetes    Right ureteral stone    Sleep apnea    C PAP   Vertigo    Vitamin D  deficiency    Wears glasses    Past Surgical History:  Procedure Laterality Date   ABDOMINAL EXPLORATION SURGERY  1998   Celiotomy and Appendectomy /  (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, congenital)   ARTERY REPAIR Right 05/31/2014   Procedure: IRRIGATION, EXPLORATION, AND REPAIR OF RIGHT ARM WOUND;  Surgeon: Lynwood MALVA Pina, MD;  Location: [facility] OR;  Service: General;  Laterality: Right;   COLONOSCOPY     CYSTOSCOPY WITH RETROGRADE PYELOGRAM, URETEROSCOPY AND STENT PLACEMENT Right 07/11/2016   Procedure: CYSTOSCOPY WITH RETROGRADE PYELOGRAM, URETEROSCOPY, BASKET STONE EXTRACTION  AND STENT PLACEMENT;  Surgeon:  Ricardo Likens, MD;  Location: [facility];  Service: Urology;  Laterality: Right;  45 MINS  C-ARM DIGITAL URETEROSCOPE HOLMIUM LASER   ESOPHAGOGASTRODUODENOSCOPY     Family History  Problem Relation Age of Onset   Hypertension Mother    Migraines Mother    Thyroid  disease Mother    Hypertension Father    Hyperlipidemia Father    Diabetes Father    Cancer Sister        thyroid    CAD Paternal Grandfather    Prostate cancer Paternal Uncle    Colon cancer Neg Hx    Rectal cancer Neg Hx    Stomach cancer Neg Hx    Social History   Socioeconomic History   Marital status: Married  Spouse name: Lorn   Number of children: 2   Years of education: 12   Highest education level: Not on file  Occupational History    Comment: na  Tobacco Use   Smoking status: Former    Current packs/day: 0.00    Average packs/day: 0.5 packs/day for 10.0 years (5.0 ttl pk-yrs)    Types: Cigarettes    Start date: 10/28/1999    Quit date: 10/27/2009    Years since quitting: 15.0   Smokeless tobacco: Former    Types: Snuff  Vaping Use   Vaping status: Never Used  Substance and Sexual Activity   Alcohol use: Not Currently    Comment: occasional   Drug use: No   Sexual activity: Yes    Comment: Vasectomy  Other Topics Concern   Not on file  Social History Narrative   Lives with wife   Caffeine -tea, 1 daily       Social Drivers of Health   Financial Resource Strain: Low Risk  (06/20/2024)   Received from [facility]   Overall Financial Resource Strain (CARDIA)    Difficulty of Paying Living Expenses: Not very hard  Food Insecurity: No Food Insecurity (06/20/2024)   [facility] from [facility]   Hunger Vital Sign    Within the past 12 months, you worried that your food would run out before you got the money to buy more.: Never true    Within the past 12 months, the food you bought just didn't last and you didn't have money to get more.: Never true  Transportation  Needs: No Transportation Needs (06/20/2024)   Received from [facility] - Transportation    Lack of Transportation (Medical): No    Lack of Transportation (Non-Medical): No  Physical Activity: Unknown (06/20/2024)   Received from [facility]   Exercise Vital Sign    On average, how many days per week do you engage in moderate to strenuous exercise (like a brisk walk)?: 0 days    Minutes of Exercise per Session: Not on file  Stress: Stress Concern Present (06/20/2024)   Received from [facility] - Occupational Stress Questionnaire    Feeling of Stress : To some extent  Social Connections: Moderately Integrated (06/20/2024)   Received from [facility]   Social Network    How would you rate your social network (family, work, friends)?: Adequate participation with social networks  Intimate Partner Violence: Not At Risk (06/20/2024)   Received from [facility]   HITS    Over the last 12 months how often did your partner physically hurt you?: Never    Over the last 12 months how often did your partner insult you or talk down to you?: Never    Over the last 12 months how often did your partner threaten you with physical harm?: Never    Over the last 12 months how often did your partner scream or curse at you?: Never       Objective:  BP 111/73   Pulse (!) 54   Temp 99 F (37.2 C)   Ht 5' 10 (1.778 m) Comment: Per pt  Wt 245 lb (111.1 kg)   SpO2 95% Comment: RA  BMI 35.15 kg/m  BMI Readings from Last 3 Encounters:  11/22/24 35.15 kg/m  11/08/24 33.75 kg/m  10/17/24 34.15 kg/m    Physical Exam: Physical Exam   ENT: Normal mucosa. No hypertrophy of inferior turbinates. Tonsils are  normal sized. Modified Mallampati score is normal. Oral cavity normal. PULMONARY: Lungs clear to auscultation bilaterally, no adventitious breath sounds. CARDIOVASCULAR: Regular rate and rhythm, S1 S2 normal, no murmurs. ABDOMEN: Abdomen  soft, nontender. Bowel sounds are normal. EXTREMITIES: No peripheral edema noted.       Diagnostic Review:  Last metabolic panel Lab Results  Component Value Date   GLUCOSE 91 08/30/2024   NA 141 08/30/2024   K 4.1 08/30/2024   CL 104 08/30/2024   CO2 25 08/30/2024   BUN 18 08/30/2024   CREATININE 1.44 (H) 08/30/2024   GFRNONAA 58 (L) 08/30/2024   CALCIUM  10.1 08/30/2024   PROT 6.6 08/28/2024   ALBUMIN  4.4 08/28/2024   BILITOT 0.9 08/28/2024   ALKPHOS 72 08/28/2024   AST 24 08/28/2024   ALT 23 08/28/2024   ANIONGAP 13 08/30/2024         Assessment & Plan:   Assessment & Plan OSA (obstructive sleep apnea)  Orders:   Overnight Pulse Oximetry Study; Future     Assessment and Plan    Obstructive sleep apnea managed with CPAP therapy Severe obstructive sleep apnea managed with CPAP. Good compliance reported. Elevated effort noted at 19. No snoring or gasping with CPAP. Current machine more comfortable and quieter. Oxygenation low without CPAP, expected correction with CPAP. - Ordered nocturnal oximetry to assess oxygenation during CPAP use. - Consider supplemental oxygen therapy if nocturnal oximetry shows low oxygenation despite CPAP.  Chronic fatigue Persistent fatigue despite CPAP use. No thyroid  issues or significant comorbidities. Fatigue not bothersome, no stimulant medications required. Current antidepressant with stimulant component deemed adequate. - Continue current management and monitor symptoms.      Denies dyspnea currently. PFT and CT chest unremarkable.  Symptoms of BPPV. But says he has been ruled out for it. Will defer to Primary care.    Notes from 11/08/24 Dr Theophilus and 10/17/24 Dr Bernie reviewed as to gather relevant information for patient care and formulating plan.  He was counselled about not driving while drowsy which is common side effect of sleep related disorders.   Return in about 1 year (around 11/22/2025).   I personally spent  a total of 30 minutes in the care of the patient today including preparing to see the patient, getting/reviewing separately obtained history, performing a medically appropriate exam/evaluation, counseling and educating, placing orders, documenting clinical information in the EHR, independently interpreting results, and communicating results.   [place] Brogden, MD

## 2024-12-08 ENCOUNTER — Encounter: Admitting: Psychology

## 2024-12-08 DIAGNOSIS — F411 Generalized anxiety disorder: Secondary | ICD-10-CM | POA: Insufficient documentation

## 2024-12-08 DIAGNOSIS — R413 Other amnesia: Secondary | ICD-10-CM | POA: Diagnosis present

## 2024-12-08 DIAGNOSIS — F3341 Major depressive disorder, recurrent, in partial remission: Secondary | ICD-10-CM | POA: Diagnosis not present

## 2024-12-08 DIAGNOSIS — G4733 Obstructive sleep apnea (adult) (pediatric): Secondary | ICD-10-CM | POA: Insufficient documentation

## 2024-12-08 DIAGNOSIS — G4489 Other headache syndrome: Secondary | ICD-10-CM | POA: Insufficient documentation
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
